# Patient Record
Sex: Male | Born: 1950
Health system: Southern US, Community
[De-identification: ages and names within clinical notes are randomized; demographics above are authoritative.]

## PROBLEM LIST (undated history)

## (undated) DIAGNOSIS — E785 Hyperlipidemia, unspecified: Secondary | ICD-10-CM

## (undated) DIAGNOSIS — T7840XA Allergy, unspecified, initial encounter: Secondary | ICD-10-CM

## (undated) DIAGNOSIS — I10 Essential (primary) hypertension: Secondary | ICD-10-CM

## (undated) DIAGNOSIS — I509 Heart failure, unspecified: Secondary | ICD-10-CM

## (undated) DIAGNOSIS — I219 Acute myocardial infarction, unspecified: Secondary | ICD-10-CM

## (undated) DIAGNOSIS — I2510 Atherosclerotic heart disease of native coronary artery without angina pectoris: Secondary | ICD-10-CM

## (undated) DIAGNOSIS — E119 Type 2 diabetes mellitus without complications: Secondary | ICD-10-CM

## (undated) HISTORY — DX: Essential (primary) hypertension: I10

## (undated) HISTORY — PX: PERCUTANEOUS CORONARY STENT INTERVENTION (PCI-S): SHX6016

## (undated) HISTORY — DX: Hyperlipidemia, unspecified: E78.5

## (undated) HISTORY — DX: Acute myocardial infarction, unspecified: I21.9

## (undated) HISTORY — PX: APPENDECTOMY: SHX54

## (undated) HISTORY — DX: Allergy, unspecified, initial encounter: T78.40XA

---

## 1993-12-30 HISTORY — PX: CORONARY ARTERY BYPASS GRAFT: SHX141

## 2008-12-30 HISTORY — PX: OTHER SURGICAL HISTORY: SHX169

## 2008-12-30 HISTORY — PX: ESOPHAGOGASTRODUODENOSCOPY: SHX1529

## 2012-07-09 ENCOUNTER — Ambulatory Visit: Payer: Self-pay | Admitting: Family Medicine

## 2012-07-09 LAB — URINALYSIS, COMPLETE
Bacteria: NEGATIVE
Bilirubin,UR: NEGATIVE
Blood: NEGATIVE
Glucose,UR: NEGATIVE mg/dL (ref 0–75)
Leukocyte Esterase: NEGATIVE
Nitrite: NEGATIVE
Protein: NEGATIVE
Specific Gravity: 1.025 (ref 1.003–1.030)
WBC UR: NONE SEEN /HPF (ref 0–5)

## 2012-07-10 LAB — URINE CULTURE

## 2014-05-09 DIAGNOSIS — I25119 Atherosclerotic heart disease of native coronary artery with unspecified angina pectoris: Secondary | ICD-10-CM | POA: Insufficient documentation

## 2014-05-09 DIAGNOSIS — I25810 Atherosclerosis of coronary artery bypass graft(s) without angina pectoris: Secondary | ICD-10-CM | POA: Insufficient documentation

## 2014-05-11 DIAGNOSIS — I214 Non-ST elevation (NSTEMI) myocardial infarction: Secondary | ICD-10-CM | POA: Insufficient documentation

## 2014-10-10 ENCOUNTER — Inpatient Hospital Stay: Payer: Self-pay | Admitting: Internal Medicine

## 2014-10-10 LAB — BASIC METABOLIC PANEL
Anion Gap: 6 — ABNORMAL LOW (ref 7–16)
BUN: 15 mg/dL (ref 7–18)
CHLORIDE: 106 mmol/L (ref 98–107)
Calcium, Total: 8.2 mg/dL — ABNORMAL LOW (ref 8.5–10.1)
Co2: 29 mmol/L (ref 21–32)
Creatinine: 1.4 mg/dL — ABNORMAL HIGH (ref 0.60–1.30)
EGFR (Non-African Amer.): 54 — ABNORMAL LOW
Glucose: 202 mg/dL — ABNORMAL HIGH (ref 65–99)
OSMOLALITY: 288 (ref 275–301)
Potassium: 4.2 mmol/L (ref 3.5–5.1)
SODIUM: 141 mmol/L (ref 136–145)

## 2014-10-10 LAB — CBC
HCT: 42.8 % (ref 40.0–52.0)
HGB: 14.9 g/dL (ref 13.0–18.0)
MCH: 33.5 pg (ref 26.0–34.0)
MCHC: 34.9 g/dL (ref 32.0–36.0)
MCV: 96 fL (ref 80–100)
PLATELETS: 182 10*3/uL (ref 150–440)
RBC: 4.46 10*6/uL (ref 4.40–5.90)
RDW: 14.2 % (ref 11.5–14.5)
WBC: 8.3 10*3/uL (ref 3.8–10.6)

## 2014-10-10 LAB — CK-MB
CK-MB: 3.8 ng/mL — AB (ref 0.5–3.6)
CK-MB: 10.3 ng/mL — ABNORMAL HIGH (ref 0.5–3.6)
CK-MB: 12.6 ng/mL — ABNORMAL HIGH (ref 0.5–3.6)
CK-MB: 8.7 ng/mL — ABNORMAL HIGH (ref 0.5–3.6)

## 2014-10-10 LAB — PRO B NATRIURETIC PEPTIDE: B-Type Natriuretic Peptide: 388 pg/mL — ABNORMAL HIGH (ref 0–125)

## 2014-10-10 LAB — TROPONIN I
TROPONIN-I: 0.09 ng/mL — AB
TROPONIN-I: 1.4 ng/mL — AB
Troponin-I: 1.7 ng/mL — ABNORMAL HIGH

## 2014-10-11 LAB — LIPID PANEL
Cholesterol: 102 mg/dL (ref 0–200)
HDL Cholesterol: 24 mg/dL — ABNORMAL LOW (ref 40–60)
Ldl Cholesterol, Calc: 39 mg/dL (ref 0–100)
Triglycerides: 194 mg/dL (ref 0–200)
VLDL Cholesterol, Calc: 39 mg/dL (ref 5–40)

## 2014-10-11 LAB — BASIC METABOLIC PANEL
Anion Gap: 8 (ref 7–16)
BUN: 16 mg/dL (ref 7–18)
Calcium, Total: 7.8 mg/dL — ABNORMAL LOW (ref 8.5–10.1)
Chloride: 107 mmol/L (ref 98–107)
Co2: 27 mmol/L (ref 21–32)
Creatinine: 1.19 mg/dL (ref 0.60–1.30)
EGFR (African American): >60
EGFR (Non-African Amer.): >60
Glucose: 114 mg/dL — ABNORMAL HIGH (ref 65–99)
Osmolality: 285 (ref 275–301)
Potassium: 4.1 mmol/L (ref 3.5–5.1)
Sodium: 142 mmol/L (ref 136–145)

## 2014-10-11 LAB — TSH: Thyroid Stimulating Horm: 2.85 u[IU]/mL

## 2014-10-11 LAB — HEMOGLOBIN A1C: Hemoglobin A1C: 6.9 % — ABNORMAL HIGH (ref 4.2–6.3)

## 2015-04-22 NOTE — Consult Note (Signed)
PATIENT NAME:  Gary Gutierrez, Gary Gutierrez DATE OF BIRTH:  Oct 22, 1951  DATE OF CONSULTATION:  10/10/2014  REFERRING PHYSICIAN:   CONSULTING PHYSICIAN:  Ursula Dermody D. Juliann Paresallwood, MD  PRIMARY PHYSICIAN: Bari EdwardLaura Berglund, MD  CARDIOLOGIST: Lamar BlinksBruce J. Kowalski, MD    INDICATION: Angina, possible non-STEMI.   HISTORY OF PRESENT ILLNESS: The patient is a 64 year old white male who came to the Emergency Room with unstable anginal symptoms, began the day before he came in, got treated with nitroglycerin, which seemed to help. In the evening, the pain returned while lying in bed. He thought the pain was positional. He has had some chronic sternal pain since his coronary bypass surgery. After repositioning and getting something to drink, the pain did not go away, it got worse. He came to the Emergency Room, finally was admitted. Total duration of the symptoms was about an hour. Pain did not radiate. Mild shortness of breath. He complains of pressure. He has had significant cardiac symptoms in the past, including myocardial infarction, coronary artery [place] he [place]. [place]    he had stents placed in 2010. Most recently he [facility] or 4 months ago. He does not remember what artery or vein was worked on, but he thinks it was one of the bypass grafts. He has been maintained on Plavix. He has been doing reasonably well until this episode of pain.   REVIEW OF SYSTEMS: No blackout spells or syncope. Denies nausea, vomiting. Denies fever, chills, sweats. No weight loss. No weight gain. No hemoptysis, hematemesis. Denies bright red blood per rectum. No vision change or hearing change. Denies sputum production. Denies cough, just complains of chest pressure with mild shortness of breath at rest.   PAST MEDICAL HISTORY: Coronary artery disease, myocardial infarction, hypertension, hyperlipidemia,  diabetes, gout, arthritis, past history of smoking.   PAST SURGICAL HISTORY: Appendectomy, 3-vessel bypass surgery, PCI and stent x 2.   FAMILY HISTORY: Heart disease, smoker, lung cancer.   SOCIAL HISTORY: Former smoker, quit years ago. Married, Corporate investment bankerconstruction worker but retired. Denies significant alcohol consumption.   MEDICATIONS: Allopurinol 300 mg a day, Coreg 25 twice a day, Crestor 10 mg a day, glipizide 10 mg once a day, Janumet 50/1000 twice a day, lisinopril 10 a day, omeprazole 40 a day, Plavix 75 a day.   ALLERGIES: PENICILLIN.   LABORATORIES: Glucose of 202, BNP 388, BUN 15, creatinine 1.4, sodium 141, potassium 4.2, calcium 8.2. Troponin  and subsequently increased to 1.7. White count of 8.3, hemoglobin 14.9, hematocrit 42.8. Chest x-ray: Diffuse interstitial markings, possible failure versus bronchitis   PHYSICAL EXAMINATION:  VITAL SIGNS: Blood pressure was 120/70, pulse of 90, respiratory rate of 16, afebrile.  HEENT: Normocephalic, atraumatic. Pupils equal and reactive to light.  NECK: Supple. No significant JVD, bruits or adenopathy.  LUNGS: Clear to auscultation and percussion. No significant wheeze, rhonchi or rale.  HEART: Regular rate and rhythm. Systolic ejection murmur at left sternal border.  ABDOMEN: Benign.  EXTREMITIES: Within normal limits.  NEUROLOGIC: Intact.  SKIN: Normal.   ASSESSMENT: Unstable angina, possible non-Q-wave myocardial infarction, hypertension, diabetes, hyperlipidemia, gout, renal insufficiency.   PLAN: Agree with admit. Follow up cardiac enzymes. Follow up EKG, which were unremarkable. Continue anticoagulation. Recommend cardiac catheterization for evaluation of significant known coronary artery disease and unstable anginal symptoms. Plan, proceed with cardiac catheterization for further evaluation with elevated troponins. Consider treatment.  For diabetes, continue Janumet, hold the dose today. Hyperlipidemia, continue Crestor therapy.  For gout, continue allopurinol. For hypertension, continue lisinopril and Coreg. Renal insufficiency, consider hydration, consider bicarbonate.   Deep venous thrombosis prophylaxis. Consider echocardiogram for assessment of LV function and base further evaluation on the results of cardiac catheterization today.     ____________________________ Bobbie Stack. Juliann Pares, MD ddc:TT D: 10/10/2014 13:06:40 ET T: 10/10/2014 14:12:46 ET JOB#: [id number]  cc: Aliece Honold D. Juliann Pares, MD, <Dictator> Alwyn Pea MD ELECTRONICALLY SIGNED 11/02/2014 10:48

## 2015-04-22 NOTE — H&P (Signed)
PATIENT NAME:  Gary Gutierrez, Gary Gutierrez MR#:  [id number] DATE OF BIRTH:  27-May-1951  DATE OF ADMISSION:  10/10/2014  REFERRING PHYSICIAN: Rebecka ApleyAllison P. Webster, MD.  PRIMARY CARE PHYSICIAN: Bari EdwardLaura Berglund, MD.   ADMISSION DIAGNOSIS: Chest pain.   HISTORY OF PRESENT ILLNESS: This is a 64 year old Caucasian male who presents to the Emergency Department complaining of substernal chest pain. The patient states it actually began yesterday, but resolved with some nitroglycerin that he had left over from a prescription that is quite old. This evening the pain returned as he was lying in his bed. He thought the pain was positional as he has a chronic history of sternal pain since having a coronary artery bypass graft surgery. When he repositioned himself on his back he realized the pain was persisting and he got up to drink some water thinking that it may be reflux. The pain continued and actually grew in severity until he got to the Emergency Department at which point it decreased some and then resolved finally with nitropaste. Total duration of the pain was approximately an hour. The pain did not radiate. It felt like an extreme pressure in his chest. Due to his cardiac history and current symptoms, the Emergency Department called for admission.   REVIEW OF SYSTEMS: CONSTITUTIONAL: The patient denies fever or weakness.  EYES: Denies diplopia or inflammation.  EARS, NOSE AND THROAT: Denies tinnitus or sore throat.  RESPIRATORY: Denies cough or shortness of breath.  CARDIOVASCULAR: Admits to chest pain, but denies orthopnea, palpitations or dyspnea on exertion.  GASTROINTESTINAL: The patient denies nausea, vomiting, diarrhea or abdominal pain.  GENITOURINARY: Denies dysuria, increased frequency or hesitancy.  ENDOCRINE: Denies polyuria or nocturia.  HEMATOLOGIC AND LYMPHATIC: Denies easy bruising or bleeding.  INTEGUMENTARY: Denies rash or lesions.  MUSCULOSKELETAL: Admits to back and neck pain, but denies  myalgias.  NEUROLOGIC: The patient denies numbness in his extremities or dysarthria.  PSYCHIATRIC: Denies depression or suicidal ideation.   PAST MEDICAL HISTORY: Coronary artery disease (myocardial infarction at 64 years old), hypertension, hyperlipidemia, diabetes mellitus type 2, gout and arthritis in his knees and hips as well as neck and back pain.   PAST SURGICAL HISTORY: Appendectomy, and three vessel coronary artery bypass grafts.  FAMILY HISTORY: Parents as well as his grandparents on both sides are deceased of heart disease. He has a brother who was a smoker, who is deceased of lung cancer.   SOCIAL HISTORY: The patient is a former smoker, but denies alcohol or illicit drug use. 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 He tries to stay active in his yard. He has an exercise climber that he uses without developing chest pain.   MEDICATIONS: 1.  Allopurinol 300 mg 1 tab p.o. daily.  2.  Carvedilol 25 mg 1 tab p.o. twice a day.  3.  Crestor 10 mg 1 tab p.o. at bedtime.  4.  Glipizide 10 mg extended release 1 tablet p.o. daily.  5.  Janumet 50 mg-1000 mg 1 tab p.o. twice a day.  6.  Lisinopril 10 mg 1 tab p.o. daily.  7.  Omeprazole 40 mg 1 capsule p.o. daily.  8.  Plavix 75 mg 1 tab p.o. daily.   ALLERGIES: PENICILLIN.   PERTINENT LABORATORY RESULTS AND RADIOGRAPHIC FINDINGS: Serum glucose is 202. BNP is 388, BUN 15, creatinine 1.40, sodium 141, potassium 4.2, chloride 106, bicarbonate 29. Calcium is 8.2. Troponin is 0.09. White blood cell count 8.3, hemoglobin 14.9, hematocrit 42.8. Chest x-ray shows some diffuse interstitial coarsening, which  could be consistent with congestive heart failure or bronchitis.   PHYSICAL EXAMINATION: VITAL SIGNS: Temperature is 98, pulse 94, respirations 19, blood pressure 120/79, pulse oximetry 99% on 2 liters oxygen via nasal cannula.  GENERAL: Alert and oriented x 3 in no apparent distress.  HEENT: Normocephalic, atraumatic. Pupils  equal, round, and reactive to light and accommodation. Extraocular movements are intact. Mucous membranes are moist.  NECK: Trachea is midline. No adenopathy.  CHEST: Symmetric and atraumatic, well-healed mid sternal scar.  CARDIOVASCULAR: Regular rate and rhythm. Normal S1 and S2. No rubs, clicks, or murmurs, but heart sounds are somewhat distant.  LUNGS: Clear to auscultation bilaterally. Normal effort and excursion.  ABDOMEN: Positive bowel sounds. Soft, nontender, nondistended. No hepatosplenomegaly.  GENITOURINARY: Deferred.  MUSCULOSKELETAL: The patient moves all 4 extremities equally. Strength is 5 out of 5 in upper and lower extremities bilaterally.  SKIN: No rashes or lesions.  EXTREMITIES: No clubbing, cyanosis, or edema.  NEUROLOGIC: Cranial nerves II through XII are grossly intact.  PSYCHIATRIC: Mood is normal. Affect is congruent.   ASSESSMENT AND PLAN: This is a 64 year old male with possible ischemic chest pain.  1. Chest pain: The patient's initial troponins are mildly elevated, but he has no acute EKG changes. He is currently out of pain, but we will continue to track his biomarkers. He was given a therapeutic dose of Lovenox in the ED, which we will continue once the patient arrives on the floor. A cardiology consultation has been ordered for possible myocardial imaging and stress testing.  2.  Hypertension: Continue lisinopril and Coreg.  3. Diabetes: The patient will be on sliding scale insulin while hospitalized. He can continue Januvia.  4.  Hyperlipidemia: Continue Crestor.  5.  Gout: Continue allopurinol.  6. Renal insufficiency: We do not have a baseline creatinine, but the patient's current GFR places him in stage 3 chronic kidney disease. He does not appear dehydrated and we will allow him to drink by mouth and avoid nephrotoxic agents.  7. Deep vein thrombosis prophylaxis: Continue therapeutic Lovenox.  8. Gastrointestinal prophylaxis: Pantoprazole.   STATUS CODE:  The patient is a full code.   TIME SPENT ON ADMISSION ORDERS AND PATIENT CARE: Approximately 35 minutes.   ___________________________ Kelton Pillar. Sheryle Hail, MD msd:hh D: 10/10/2014 02:58:18 ET T: 10/10/2014 03:23:35 ET JOB#: [id number]  cc: Kelton Pillar. Sheryle Hail, MD, <Dictator> Kelton Pillar Orley Lawry MD ELECTRONICALLY SIGNED 10/21/2014 7:31

## 2015-04-22 NOTE — Consult Note (Signed)
Chief Complaint:  Subjective/Chief Complaint States to be doing reasonably well no chest pain no shortness of breath status post PCI and stent and feels much better.   VITAL SIGNS/ANCILLARY NOTES: **Vital Signs.:   13-Oct-15 08:00  Vital Signs Type Routine  Temperature Temperature (F) 97.7  Celsius 36.5  Temperature Source axillary  Pulse Pulse 80  Respirations Respirations 14  Systolic BP Systolic BP 836  Diastolic BP (mmHg) Diastolic BP (mmHg) 81  Mean BP 99  Pulse Ox % Pulse Ox % 98  Pulse Ox Activity Level  At rest  Oxygen Delivery Room Air/ 21 %  *Intake and Output.:   Shift 13-Oct-15 07:00  Grand Totals Intake:  440 Output:      Net:  440 24 Hr.:  -583  Oral Intake      In:  240  IV (Primary)      In:  200  Length of Stay Totals Intake:  1477 Output:  2060    Net:  -583   Brief Assessment:  GEN well developed, well nourished, no acute distress   Cardiac Regular  murmur present  -- JVD   Respiratory normal resp effort  clear BS   Gastrointestinal Normal   Gastrointestinal details normal Soft  Nontender  Nondistended  No masses palpable   EXTR negative cyanosis/clubbing, negative edema   Lab Results: Thyroid:  13-Oct-15 04:14   Thyroid Stimulating Hormone 2.85 (0.45-4.50 (IU = International Unit)  ----------------------- Pregnant patients have  different reference  ranges for TSH:  - - - - - - - - - -  Pregnant, first trimetser:  0.36 - 2.50 uIU/mL)  Cardiology:  12-Oct-15 00:46   Ventricular Rate 98  Atrial Rate 98  P-R Interval 186  QRS Duration 98  QT 326  QTc 416  P Axis 41  R Axis 69  T Axis 38  ECG interpretation Sinus rhythm with frequent Premature ventricular complexes Possible Inferior infarct , age undetermined Cannot rule out Anterior infarct , age undetermined Abnormal ECG No previous ECGs available ----------unconfirmed---------- Confirmed by OVERREAD, NOT ([id number]), editor PEARSON, BARBARA ([id number]) on 10/10/2014 12:54:38 PM    15:06    Ventricular Rate 77  Atrial Rate 77  P-R Interval 202  QRS Duration 92  QT 384  QTc 434  P Axis 63  R Axis 75  T Axis 106  ECG interpretation Normal sinus rhythm Nonspecific T wave abnormality Abnormal ECG When compared with ECG of 10-Oct-2014 00:46, Premature ventricular complexes are no longer Present Nonspecific T wave abnormality, improved in Inferior leads Nonspecific T wave abnormality now evident in Anterolateral leads ----------unconfirmed---------- Confirmed by OVERREAD, NOT ([id number]), editor PEARSON, BARBARA ([id number]) on 10/11/2014 7:59:01 AM  Routine Chem:  12-Oct-15 01:05   Glucose, Serum  202  BUN 15  Creatinine (comp)  1.40  Sodium, Serum 141  Potassium, Serum 4.2  Chloride, Serum 106  CO2, Serum 29  Calcium (Total), Serum  8.2  Anion Gap  6  Osmolality (calc) 288  eGFR (African American) >60  eGFR (Non-African American)  54 (eGFR values <57m/min/1.73 m2 may be an indication of chronic kidney disease (CKD). Calculated eGFR, using the MRDR Study equation, is useful in  patients with stable renal function. The eGFR calculation will not be reliable in acutely ill patients when serum creatinine is changing rapidly. It is not useful in patients on dialysis. The eGFR calculation may not be applicable to patients at the low and high extremes of body sizes, pregnant women, and vetetarians.)  Result  Comment TROPONIN - RESULTS VERIFIED BY REPEAT TESTING.  - C/BETH BRANDEL AT 0146 10/10/14.[facility]  - READ-BACK PROCESS PERFORMED.  Result(s) reported on 10 Oct 2014 at 01:48AM.  B-Type Natriuretic Peptide [facility])  388 (Result(s) reported on 10 Oct 2014 at 01:44AM.)    05:30   Result Comment TROPONIN - RESULTS VERIFIED BY REPEAT TESTING.  - CALLED TO FELECIA PREUDHOMME AT 2330 ON  - 10/10/2014.Marland KitchenMarland KitchenTPL  - READ-BACK PROCESS PERFORMED.  Result(s) reported on 10 Oct 2014 at 06:28AM.    09:12   Result Comment Troponin - RESULTS VERIFIED BY REPEAT TESTING.  - Elevated troponin  previously called @  - 0146 10/10/14 by [facility] - BGB.  Result(s) reported on 10 Oct 2014 at 10:12AM.  13-Oct-15 04:14   Glucose, Serum  114  BUN 16  Creatinine (comp) 1.19  Sodium, Serum 142  Potassium, Serum 4.1  Chloride, Serum 107  CO2, Serum 27  Calcium (Total), Serum  7.8  Anion Gap 8  Osmolality (calc) 285  eGFR (African American) >60  eGFR (Non-African American) >60 (eGFR values <51m/min/1.73 m2 may be an indication of chronic kidney disease (CKD). Calculated eGFR, using the MRDR Study equation, is useful in  patients with stable renal function. The eGFR calculation will not be reliable in acutely ill patients when serum creatinine is changing rapidly. It is not useful in patients on dialysis. The eGFR calculation may not be applicable to patients at the low and high extremes of body sizes, pregnant women, and vetetarians.)  Hemoglobin A1c ([facility])  6.9 (The American Diabetes Association recommends that a primary goal of therapy should be <7% and that physicians should reevaluate the treatment regimen in patients with HbA1c values consistently >8%.)  Cholesterol, Serum 102  Triglycerides, Serum 194  HDL (INHOUSE)  24  VLDL Cholesterol Calculated 39  LDL Cholesterol Calculated 39 (Result(s) reported on 11 Oct 2014 at 05:18AM.)  Cardiac:  12-Oct-15 01:05   CPK-MB, Serum  3.8 (Result(s) reported on 10 Oct 2014 at 03:22AM.)  Troponin I  0.09 (0.00-0.05 0.05 ng/mL or less: NEGATIVE  Repeat testing in 3-6 hrs  if clinically indicated. >0.05 ng/mL: POTENTIAL  MYOCARDIAL INJURY. Repeat  testing in 3-6 hrs if  clinically indicated. NOTE: An increase or decrease  of 30% or more on serial  testing suggests a  clinically important change)    05:30   CPK-MB, Serum  10.3 (Result(s) reported on 10 Oct 2014 at 06:25AM.)  Troponin I  1.40 (0.00-0.05 0.05 ng/mL or less: NEGATIVE  Repeat testing in 3-6 hrs  if clinically indicated. >0.05 ng/mL: POTENTIAL  MYOCARDIAL INJURY.  Repeat  testing in 3-6 hrs if  clinically indicated. NOTE: An increase or decrease  of 30% or more on serial  testing suggests a  clinically important change)    09:12   CPK-MB, Serum  12.6 (Result(s) reported on 10 Oct 2014 at 10:06AM.)  Troponin I  1.70 (0.00-0.05 0.05 ng/mL or less: NEGATIVE  Repeat testing in 3-6 hrs  if clinically indicated. >0.05 ng/mL: POTENTIAL  MYOCARDIAL INJURY. Repeat  testing in 3-6 hrs if  clinically indicated. NOTE: An increase or decrease  of 30% or more on serial  testing suggests a  clinically important change)    14:43   CPK-MB, Serum  8.7 (Result(s) reported on 10 Oct 2014 at 03:06PM.)  Routine Hem:  12-Oct-15 01:05   WBC (CBC) 8.3  RBC (CBC) 4.46  Hemoglobin (CBC) 14.9  Hematocrit (CBC) 42.8  Platelet Count (CBC) 182 (Result(s) reported on 10 Oct 2014 at 01:22AM.)  MCV 96  MCH 33.5  MCHC 34.9  RDW 14.2   Radiology Results: XRay:    12-Oct-15 01:20, Chest Portable Single View  Chest Portable Single View   REASON FOR EXAM:    chest pain  COMMENTS:       PROCEDURE: DXR - DXR PORTABLE CHEST SINGLE VIEW  - Oct 10 2014  1:20AM     CLINICAL DATA:  Intermittent chest pain since Saturday. Initial  encounter    EXAM:  PORTABLE CHEST - 1 VIEW    COMPARISON:  None.    FINDINGS:  Mild cardiomegaly. The patient is status post CABG. Negative aortic  and hilar contours when accounting for rightward rotation.    Diffuse interstitial prominence without Kerley lines are focal  consolidation. No effusion or pneumothorax.     IMPRESSION:  Diffuse interstitial coarsening which could be congestive or  bronchitic.      Electronically Signed    By: Jorje Guild M.D.    On: 10/10/2014 01:37       Verified By: Gilford Silvius, M.D.,  Cardiology:    12-Oct-15 00:46, ED ECG  Ventricular Rate 98  Atrial Rate 98  P-R Interval 186  QRS Duration 98  QT 326  QTc 416  P Axis 41  R Axis 69  T Axis 38  ECG interpretation   Sinus  rhythm with frequent Premature ventricular complexes  Possible Inferior infarct , age undetermined  Cannot rule out Anterior infarct , age undetermined  Abnormal ECG  No previous ECGs available  ----------unconfirmed----------  Confirmed by OVERREAD, NOT ([id number]), editor PEARSON, BARBARA ([id number]) on 10/10/2014 12:54:38 PM  ED ECG     12-Oct-15 15:06, ECG  Ventricular Rate 77  Atrial Rate 77  P-R Interval 202  QRS Duration 92  QT 384  QTc 434  P Axis 63  R Axis 75  T Axis 106  ECG interpretation   Normal sinus rhythm  Nonspecific T wave abnormality  Abnormal ECG  When compared with ECG of 10-Oct-2014 00:46,  Premature ventricular complexes are no longer Present  Nonspecific T wave abnormality, improved in Inferior leads  Nonspecific T wave abnormality now evident in Anterolateral leads  ----------unconfirmed----------  Confirmed by OVERREAD, NOT ([id number]), editor PEARSON, BARBARA ([id number]) on 10/11/2014 7:59:01 AM  ECG    Assessment/Plan:  Assessment/Plan:  Assessment IMP  status post PCI and stent  coronary disease  angina  non-Q-wave myocardial infarction  hypertension  diabetes  chronic renal insufficiency   hyperlipidemia .   Plan PLAN 1 patient doing reasonably well and status post PCI and stent with DES 2 increase Crestor because of severe hyperlipidemia 3 continue diabetes management with glipizide and Janumet the min as per primary physician 4 continue  hypertension therapy 5 follow-up renal sufficiency consider referral to Nephrology  6 aspirin 81 mg a day 7 the Plavix therapy for at least a year if not indefinitely 8 cardiac rehab at heart tract 9 outpatient follow-up with BK in 1-2 weeks   Electronic Signatures: Lujean Amel D (MD)  (Signed 14-Oct-15 10:40)  Authored: Chief Complaint, VITAL SIGNS/ANCILLARY NOTES, Brief Assessment, Lab Results, Radiology Results, Assessment/Plan   Last Updated: 14-Oct-15 10:40 by Lujean Amel D (MD)

## 2015-04-22 NOTE — Discharge Summary (Signed)
PATIENT NAME:  Gary Gutierrez, Gary W MR#:  [id number] DATE OF BIRTH:  Dec 14, 1951  DATE OF ADMISSION:  10/10/2014 DATE OF DISCHARGE:  10/11/2014  For a detailed note please refer to the history and physical done on admission by Dr. Sheryle Hailiamond.   DIAGNOSES AT DISCHARGE:   1.  Non-ST elevation myocardial infarction status post catheter and drug eluding stent placement to the proximal circumflex.  2.  Diabetes.  3.  Hypertension.  4.  Hyperlipidemia.  5.  Previous history of coronary artery disease.    DISCHARGE INSTRUCTIONS:  The patient is being discharged on a low-sodium, low-fat, American Diabetic Association diet.  Activity is as tolerated.  Follow up with Dr. Dorothyann Pengwayne Callwood and Dr. Bari EdwardLaura Berglund in the low the next 1-2 weeks.   DISCHARGE MEDICATIONS: Glipizide 10 mg extended release daily, Plavix 75 mg daily, lisinopril 10 mg daily, Janumet 50/1000 one tab b.i.d., allopurinol 300 mg daily, Coreg 25 mg b.i.d., Crestor 20 mg at bedtime, Protonix 40 mg daily, and aspirin 81 mg daily.   CONSULTANTS DURING THE HOSPITAL COURSE: Dr. Dorothyann Pengwayne Callwood from cardiology.   PERTINENT STUDIES DONE DURING THE HOSPITAL COURSE:  A chest x-ray done on admission showing diffuse interstitial coarsening which could be congestive or bronchitic in nature. Cardiac catheterization done on 10/10/2014 with drug-eluting stent placement to the SVG to the circumflex graft. Moderately reduced LV systolic function.   HOSPITAL COURSE: This is a 64 year old male with medical problems as mentioned above, who presented to the hospital on 10/10/2001 due to chest pain and noted to have elevated troponin.   1.  Non-ST elevation MI.  This was the likely clinical diagnosis given the patient's chest pain and elevated cardiac markers. The patient did rule in for a non-ST elevation MI given his progressive elevation of his cardiac markers. The patient was maintained on some aspirin and Lovenox, beta blocker statin and low-dose ACE inhibitor  along with his Plavix. Cardiology consult was obtained. The patient was seen by Dr. Juliann Paresallwood who performed a cardiac catheterization and found 100% stenosis in the proximal circumflex graft, which was stented. Post stent and PCI the patient has been chest pain-free and hemodynamically stable. He is therefore presently being discharged on aspirin, Plavix, beta blocker, statin, and ACE inhibitor as mentioned above. He will have close followup with his cardiologist in the next 1-2 weeks.  2.  Diabetes. The patient's blood sugars remained stable. He was maintained on some sliding scale insulin and his metformin was held as he had a recent cardiac catheterization. He will resume his Januvia and metformin 24 hours after discharge. 3.  Hyperlipidemia.  The patient was maintained on his Crestor, his dose was increased as he had a recent non-ST elevation MI, it was increased from 10 to 20.  4.  Hypertension. The patient's blood pressure remained stable. He will continue his Coreg and lisinopril.  5.  Gout.  The patient had no acute gout attack. He will continue his allopurinol as stated.   CODE STATUS: The patient is a full code.   TIME SPENT ON DISCHARGE: 40 minutes.     ____________________________ Rolly PancakeVivek J. Cherlynn KaiserSainani, MD vjs:bu D: 10/11/2014 14:14:19 ET T: 10/11/2014 15:40:24 ET JOB#: [id number]  cc: Rolly PancakeVivek J. Cherlynn KaiserSainani, MD, <Dictator> Dwayne D. Juliann Paresallwood, MD Bari EdwardLaura Berglund, MD [place] SirenVIVEK J Kessler Kopinski MD ELECTRONICALLY SIGNED 11/07/2014 10:54

## 2015-05-09 DIAGNOSIS — Z87898 Personal history of other specified conditions: Secondary | ICD-10-CM | POA: Diagnosis not present

## 2015-05-09 DIAGNOSIS — I10 Essential (primary) hypertension: Secondary | ICD-10-CM | POA: Diagnosis not present

## 2015-05-09 DIAGNOSIS — I25119 Atherosclerotic heart disease of native coronary artery with unspecified angina pectoris: Secondary | ICD-10-CM | POA: Diagnosis not present

## 2015-05-09 DIAGNOSIS — I209 Angina pectoris, unspecified: Secondary | ICD-10-CM | POA: Diagnosis not present

## 2015-05-14 ENCOUNTER — Emergency Department
Admission: EM | Admit: 2015-05-14 | Discharge: 2015-05-14 | Disposition: A | Discharge status: Home / Self Care | Payer: Medicare Other | Attending: Emergency Medicine | Admitting: Emergency Medicine

## 2015-05-14 ENCOUNTER — Encounter: Payer: Self-pay | Admitting: *Deleted

## 2015-05-14 DIAGNOSIS — Z87891 Personal history of nicotine dependence: Secondary | ICD-10-CM | POA: Diagnosis not present

## 2015-05-14 DIAGNOSIS — L089 Local infection of the skin and subcutaneous tissue, unspecified: Secondary | ICD-10-CM | POA: Diagnosis present

## 2015-05-14 DIAGNOSIS — Z880 Allergy status to penicillin: Secondary | ICD-10-CM | POA: Insufficient documentation

## 2015-05-14 DIAGNOSIS — L03011 Cellulitis of right finger: Secondary | ICD-10-CM | POA: Diagnosis not present

## 2015-05-14 HISTORY — DX: Type 2 diabetes mellitus without complications: E11.9

## 2015-05-14 HISTORY — DX: Atherosclerotic heart disease of native coronary artery without angina pectoris: I25.10

## 2015-05-14 NOTE — Discharge Instructions (Signed)
Paronychia Paronychia is an inflammatory reaction involving the folds of the skin surrounding the fingernail. This is commonly caused by an infection in the skin around a nail. The most common cause of paronychia is frequent wetting of the hands (as seen with bartenders, food servers, nurses or others who wet their hands). This makes the skin around the fingernail susceptible to infection by bacteria (germs) or fungus. Other predisposing factors are:  Aggressive manicuring.  Nail biting.  Thumb sucking. The most common cause is a staphylococcal (a type of germ) infection, or a fungal (Candida) infection. When caused by a germ, it usually comes on suddenly with redness, swelling, pus and is often painful. It may get under the nail and form an abscess (collection of pus), or form an abscess around the nail. If the nail itself is infected with a fungus, the treatment is usually prolonged and may require oral medicine for up to one year. Your caregiver will determine the length of time treatment is required. The paronychia caused by bacteria (germs) may largely be avoided by not pulling on hangnails or picking at cuticles. When the infection occurs at the tips of the finger it is called felon. When the cause of paronychia is from the herpes simplex virus (HSV) it is called herpetic whitlow. TREATMENT  When an abscess is present treatment is often incision and drainage. This means that the abscess must be cut open so the pus can get out. When this is done, the following home care instructions should be followed. HOME CARE INSTRUCTIONS   It is important to keep the affected fingers very dry. Rubber or plastic gloves over cotton gloves should be used whenever the hand must be placed in water.  Keep wound clean, dry and dressed as suggested by your caregiver between warm soaks or warm compresses.  Soak in warm water for fifteen to twenty minutes three to four times per day for bacterial infections. Fungal  infections are very difficult to treat, so often require treatment for long periods of time.  For bacterial (germ) infections take antibiotics (medicine which kill germs) as directed and finish the prescription, even if the problem appears to be solved before the medicine is gone.  Only take over-the-counter or prescription medicines for pain, discomfort, or fever as directed by your caregiver. SEEK IMMEDIATE MEDICAL CARE IF:  You have redness, swelling, or increasing pain in the wound.  You notice pus coming from the wound.  You have a fever.  You notice a bad smell coming from the wound or dressing. Document Released: 06/11/2001 Document Revised: 03/09/2012 Document Reviewed: 02/10/2009 [place] Patient Information 2015 [place], [place]. This information is not intended to replace advice given to you by your health care provider. Make sure you discuss any questions you have with your health care provider.   Keep the wound clean, dry, and covered.

## 2015-05-14 NOTE — ED Provider Notes (Signed)
[facility] Emergency Department Provider Note ____________________________________________  Time seen: 1224  I have reviewed the triage vital signs and the nursing notes.  HISTORY  Chief Complaint Finger Injury  HPI Gary Gutierrez is a 64 y.o. male reports to the ED with right thumb infection. He reports bone off a hangnail about a week ago, and has since developed some pain, redness, swelling and pus around the thumbnail on the right. He attempted to release some of the pus last night using a small diabetic lancet, reports only small amount of purulent discharge at that time. He is here for treatment denied any fever, chills, or sweats.  Past Medical History  Diagnosis Date  . Diabetes mellitus without complication   . Coronary artery disease    There are no active problems to display for this patient.  Past Surgical History  Procedure Laterality Date  . Appendectomy     No current outpatient prescriptions on file.  Allergies Penicillins  No family history on file.  Social History History  Substance Use Topics  . Smoking status: Former Games developermoker  . Smokeless tobacco: Not on file  . Alcohol Use: No   Review of Systems  Constitutional: Negative for fever. Eyes: Negative for visual changes. ENT: Negative for sore throat. Cardiovascular: Negative for chest pain. Respiratory: Negative for shortness of breath. Gastrointestinal: Negative for abdominal pain, vomiting and diarrhea. Genitourinary: Negative for dysuria. Musculoskeletal: Negative for back pain. Skin: Negative for rash. Positive for cuticle infection Neurological: Negative for headaches, focal weakness or numbness.  10-point ROS otherwise negative. ____________________________________________  PHYSICAL EXAM:  VITAL SIGNS: ED Triage Vitals  Enc Vitals Group     BP 05/14/15 1107 135/97 mmHg     Pulse Rate 05/14/15 1107 87     Resp --      Temp 05/14/15 1107 98.3 F (36.8 C)      Temp src --      SpO2 05/14/15 1107 100 %     Weight 05/14/15 1107 218 lb (98.884 kg)     Height 05/14/15 1107 5' (1.524 m)     Head Cir --      Peak Flow --      Pain Score 05/14/15 1108 5     Pain Loc --      Pain Edu? --      Excl. in GC? --    Constitutional: Alert and oriented. Well appearing and in no distress. Eyes: Conjunctivae are normal. PERRL. Normal extraocular movements. ENT   Head: Normocephalic and atraumatic.   Nose: No congestion/rhinnorhea.   Mouth/Throat: Mucous membranes are moist.   Neck: No stridor. Musculoskeletal: Nontender with normal range of motion in all extremities. No joint effusions.  No lower extremity tenderness nor edema. Neurologic:  No gross focal neurologic deficits are appreciated. No gait instability. Skin:  Skin is warm, dry and intact. No rash noted. Patient with a localized collection of pus to the medial thumbnail on the right hand. There is some minimal local erythema appreciated Psychiatric: Mood and affect are normal. Speech and behavior are normal. Patient exhibits appropriate insight and judgment. ____________________________________________  PROCEDURES  Procedure(s) performed: Incision & drainage of Right Thumb paronychia     Thumb prepped with chlorhex wipe     Local pus collection drained using a sterile 18G needle     Moderate amount of purulent material released from the cuticle and lateral nail bed.     Patient tolerated the procedure without difficulty  Critical Care performed:  No ____________________________________________  INITIAL IMPRESSION / ASSESSMENT AND PLAN / ED COURSE  Right Thumb Paronychia s/p I & D. Wound care instructions given.   Pertinent labs & imaging results that were available during my care of the patient were reviewed by me and considered in my medical decision making (see chart for details). ____________________________________________  FINAL CLINICAL IMPRESSION(S) / ED  DIAGNOSES  Final diagnoses:  Paronychia of right thumb     Lissa HoardJenise V Bacon Ramiel Forti, PA-C 05/14/15 1254  Jene Everyobert Kinner, MD 05/14/15 1539

## 2015-06-09 ENCOUNTER — Other Ambulatory Visit: Payer: Self-pay | Admitting: Internal Medicine

## 2015-06-09 ENCOUNTER — Encounter: Payer: Self-pay | Admitting: Internal Medicine

## 2015-06-09 DIAGNOSIS — I10 Essential (primary) hypertension: Secondary | ICD-10-CM | POA: Insufficient documentation

## 2015-06-09 DIAGNOSIS — K219 Gastro-esophageal reflux disease without esophagitis: Secondary | ICD-10-CM | POA: Insufficient documentation

## 2015-06-09 DIAGNOSIS — M539 Dorsopathy, unspecified: Secondary | ICD-10-CM | POA: Insufficient documentation

## 2015-06-09 DIAGNOSIS — M109 Gout, unspecified: Secondary | ICD-10-CM | POA: Insufficient documentation

## 2015-06-09 DIAGNOSIS — E1165 Type 2 diabetes mellitus with hyperglycemia: Secondary | ICD-10-CM

## 2015-06-09 DIAGNOSIS — IMO0001 Reserved for inherently not codable concepts without codable children: Secondary | ICD-10-CM | POA: Insufficient documentation

## 2015-06-09 DIAGNOSIS — E1169 Type 2 diabetes mellitus with other specified complication: Secondary | ICD-10-CM | POA: Insufficient documentation

## 2015-06-09 DIAGNOSIS — E785 Hyperlipidemia, unspecified: Secondary | ICD-10-CM

## 2015-06-09 DIAGNOSIS — F17200 Nicotine dependence, unspecified, uncomplicated: Secondary | ICD-10-CM | POA: Insufficient documentation

## 2015-07-14 ENCOUNTER — Other Ambulatory Visit: Payer: Self-pay | Admitting: Internal Medicine

## 2015-07-17 ENCOUNTER — Other Ambulatory Visit: Payer: Self-pay | Admitting: Internal Medicine

## 2015-07-27 ENCOUNTER — Encounter: Payer: Self-pay | Admitting: Internal Medicine

## 2015-07-27 ENCOUNTER — Ambulatory Visit (INDEPENDENT_AMBULATORY_CARE_PROVIDER_SITE_OTHER): Payer: Medicare Other | Admitting: Internal Medicine

## 2015-07-27 VITALS — BP 110/70 | HR 76 | Ht 69.0 in | Wt 211.8 lb

## 2015-07-27 DIAGNOSIS — E785 Hyperlipidemia, unspecified: Secondary | ICD-10-CM | POA: Diagnosis not present

## 2015-07-27 DIAGNOSIS — I10 Essential (primary) hypertension: Secondary | ICD-10-CM

## 2015-07-27 DIAGNOSIS — E119 Type 2 diabetes mellitus without complications: Secondary | ICD-10-CM

## 2015-07-27 DIAGNOSIS — I25119 Atherosclerotic heart disease of native coronary artery with unspecified angina pectoris: Secondary | ICD-10-CM

## 2015-07-27 NOTE — Progress Notes (Signed)
Date:  07/27/2015   Name:  Gary Gutierrez   DOB:  09/19/1951   MRN:  [id number]   Chief Complaint: Diabetes; Hyperlipidemia; and Hypertension Diabetes He presents for his follow-up diabetic visit. He has type 2 diabetes mellitus. His disease course has been worsening. There are no hypoglycemic associated symptoms. Pertinent negatives for hypoglycemia include no confusion, dizziness or headaches. Pertinent negatives for diabetes include no chest pain, no polydipsia and no polyuria. Symptoms are stable. Current diabetic treatment includes oral agent (triple therapy). He is compliant with treatment some of the time (he stopped taking the second dose of janumet because he was taking it at bedtime and having hypoglycemic episodes). He is following a generally unhealthy (too many carbs and fast food) diet. He has not had a previous visit with a dietitian (he doesnt think he will be able to understand what they will be teaching). His home blood glucose trend is increasing steadily. His breakfast blood glucose is taken between 7-8 am. His breakfast blood glucose range is generally 180-200 mg/dl. An ACE inhibitor/angiotensin II receptor blocker is being taken.  Hyperlipidemia This is a new problem. The current episode started more than 1 year ago. The problem is controlled. Recent lipid tests were reviewed and are normal. Exacerbating diseases include diabetes. Associated symptoms include shortness of breath. Pertinent negatives include no chest pain. The current treatment provides significant improvement of lipids.  Hypertension This is a chronic problem. The current episode started more than 1 year ago. The problem is unchanged. The problem is controlled. Associated symptoms include shortness of breath. Pertinent negatives include no chest pain, headaches or palpitations. Compliance problems include diet.   CAD - stable mild angina symptoms.  Recent visit with Dr. Juliann Pares.  No medication changes  made.  Review of Systems:  Review of Systems  Constitutional: Positive for unexpected weight change. Negative for fever.  HENT: Negative for hearing loss.   Eyes: Negative for visual disturbance.  Respiratory: Positive for shortness of breath. Negative for chest tightness.   Cardiovascular: Negative for chest pain, palpitations and leg swelling.  Gastrointestinal: Negative for abdominal pain.  Endocrine: Negative for polydipsia and polyuria.  Genitourinary: Negative for dysuria and frequency.  Neurological: Negative for dizziness, numbness and headaches.  Hematological: Bruises/bleeds easily.  Psychiatric/Behavioral: Negative for confusion and dysphoric mood.    Patient Active Problem List   Diagnosis Date Noted  . Diabetes 06/09/2015  . Acid reflux 06/09/2015  . HLD (hyperlipidemia) 06/09/2015  . Essential hypertension 06/09/2015  . Controlled gout 06/09/2015  . Multilevel degenerative disc disease 06/09/2015  . Acute non-ST segment elevation myocardial infarction 05/11/2014  . Coronary artery disease involving native coronary artery of native heart with angina pectoris 05/09/2014    Prior to Admission medications   Medication Sig Start Date End Date Taking? Authorizing Provider  ACCU-CHEK AVIVA PLUS test strip Place 1 each onto the skin daily. 04/05/15  Yes Historical Provider, MD  allopurinol (ZYLOPRIM) 300 MG tablet Take 1 tablet by mouth daily. 04/28/15  Yes Historical Provider, MD  aspirin EC 81 MG tablet Take 81 mg by mouth daily.   Yes Historical Provider, MD  atorvastatin (LIPITOR) 80 MG tablet TAKE 1 TABLET BY MOUTH AT BEDTIME 07/17/15  Yes [name], MD  carvedilol (COREG) 25 MG tablet Take 1 tablet by mouth 2 (two) times daily. 04/17/15  Yes Historical Provider, MD  clopidogrel (PLAVIX) 75 MG tablet Take 1 tablet by mouth daily. 04/17/15  Yes Historical Provider, MD  glipiZIDE (GLUCOTROL XL)  10 MG 24 hr tablet TAKE 1 TABLET BY MOUTH EVERY DAY 07/14/15  Yes [name], MD  JANUMET 50-1000 MG per tablet Take 1 tablet by mouth 2 (two) times daily. 05/05/15  Yes Historical Provider, MD  lisinopril (PRINIVIL,ZESTRIL) 5 MG tablet Take 1 tablet by mouth daily. 04/17/15  Yes Historical Provider, MD  pantoprazole (PROTONIX) 40 MG tablet Take 1 tablet by mouth daily. 05/05/15  Yes Historical Provider, MD    Allergies  Allergen Reactions  . Penicillins Other (See Comments)    Other reaction(s): Unknown    Past Surgical History  Procedure Laterality Date  . Appendectomy    . Colonscopy  2010    benign polyps  . Esophagogastroduodenoscopy  2010    normal  . Coronary artery bypass graft  1995  . Percutaneous coronary stent intervention (pci-s)  2010, 2015    4 stents total    History  Substance Use Topics  . Smoking status: Former Games developer  . Smokeless tobacco: Not on file  . Alcohol Use: No    Medication list has been reviewed and updated.  Physical Examination:  Physical Exam  Constitutional: He is oriented to person, place, and time. He appears well-developed and well-nourished. No distress.  HENT:  Head: Normocephalic and atraumatic.  Eyes: Conjunctivae are normal. Right eye exhibits no discharge. Left eye exhibits no discharge. No scleral icterus.  Neck: Normal range of motion. Neck supple. No thyromegaly present.  Cardiovascular: Normal rate, regular rhythm and normal heart sounds.   Pulmonary/Chest: Effort normal and breath sounds normal. No respiratory distress. He has no wheezes.  Musculoskeletal: Normal range of motion. He exhibits no edema or tenderness.  Neurological: He is alert and oriented to person, place, and time.  Skin: Skin is warm and dry. No rash noted.  Psychiatric: He has a normal mood and affect. His behavior is normal. Thought content normal.  BP 110/70 mmHg  Pulse 76  Ht  (1.753 m)  Wt 211 lb 12.8 oz (96.072 kg)  BMI 31.26 kg/m2  Assessment and Plan: 1. Type 2 diabetes mellitus without complication Long  discussion regarding diet in DM Will not refer to dietician per patient preference Change Janumet dosing to before breakfast and supper daily - Hemoglobin A1c  2. Arteriosclerosis of autologous vein coronary artery bypass graft Stable angina; followed by Dr. Juliann Pares Continue current medication  3. Essential hypertension The current medical regimen is effective;  continue present plan and medications.  4. HLD (hyperlipidemia) On statin therapy   Bari Edward, MD [facility]  07/27/2015

## 2015-07-28 LAB — HEMOGLOBIN A1C
Est. average glucose Bld gHb Est-mCnc: 169 mg/dL
HEMOGLOBIN A1C: 7.5 % — AB (ref 4.8–5.6)

## 2015-08-08 ENCOUNTER — Other Ambulatory Visit: Payer: Self-pay | Admitting: Internal Medicine

## 2015-08-28 ENCOUNTER — Other Ambulatory Visit: Payer: Self-pay | Admitting: Internal Medicine

## 2015-09-25 ENCOUNTER — Other Ambulatory Visit: Payer: Self-pay | Admitting: Internal Medicine

## 2015-09-26 ENCOUNTER — Other Ambulatory Visit: Payer: Self-pay | Admitting: Internal Medicine

## 2015-11-09 DIAGNOSIS — I10 Essential (primary) hypertension: Secondary | ICD-10-CM | POA: Diagnosis not present

## 2015-11-09 DIAGNOSIS — E119 Type 2 diabetes mellitus without complications: Secondary | ICD-10-CM | POA: Diagnosis not present

## 2015-11-09 DIAGNOSIS — I429 Cardiomyopathy, unspecified: Secondary | ICD-10-CM | POA: Diagnosis not present

## 2015-11-09 DIAGNOSIS — M1990 Unspecified osteoarthritis, unspecified site: Secondary | ICD-10-CM | POA: Diagnosis not present

## 2015-11-09 DIAGNOSIS — I209 Angina pectoris, unspecified: Secondary | ICD-10-CM | POA: Diagnosis not present

## 2015-11-09 DIAGNOSIS — Z87898 Personal history of other specified conditions: Secondary | ICD-10-CM | POA: Diagnosis not present

## 2015-11-09 DIAGNOSIS — I25118 Atherosclerotic heart disease of native coronary artery with other forms of angina pectoris: Secondary | ICD-10-CM | POA: Diagnosis not present

## 2015-11-09 DIAGNOSIS — K219 Gastro-esophageal reflux disease without esophagitis: Secondary | ICD-10-CM | POA: Diagnosis not present

## 2015-11-09 DIAGNOSIS — Z951 Presence of aortocoronary bypass graft: Secondary | ICD-10-CM | POA: Diagnosis not present

## 2015-11-09 DIAGNOSIS — E784 Other hyperlipidemia: Secondary | ICD-10-CM | POA: Diagnosis not present

## 2015-11-30 ENCOUNTER — Other Ambulatory Visit: Payer: Self-pay | Admitting: Internal Medicine

## 2015-12-15 ENCOUNTER — Other Ambulatory Visit: Payer: Self-pay | Admitting: Internal Medicine

## 2015-12-18 ENCOUNTER — Other Ambulatory Visit: Payer: Self-pay | Admitting: Internal Medicine

## 2015-12-19 NOTE — Telephone Encounter (Signed)
pts coming in on 1/4 for his f/u dm

## 2016-01-03 ENCOUNTER — Ambulatory Visit (INDEPENDENT_AMBULATORY_CARE_PROVIDER_SITE_OTHER): Payer: Medicare Other | Admitting: Family Medicine

## 2016-01-03 ENCOUNTER — Encounter: Payer: Self-pay | Admitting: Family Medicine

## 2016-01-03 VITALS — BP 132/76 | HR 72 | Ht 69.0 in | Wt 215.2 lb

## 2016-01-03 DIAGNOSIS — Z23 Encounter for immunization: Secondary | ICD-10-CM

## 2016-01-03 DIAGNOSIS — E785 Hyperlipidemia, unspecified: Secondary | ICD-10-CM

## 2016-01-03 DIAGNOSIS — H9193 Unspecified hearing loss, bilateral: Secondary | ICD-10-CM

## 2016-01-03 DIAGNOSIS — I25119 Atherosclerotic heart disease of native coronary artery with unspecified angina pectoris: Secondary | ICD-10-CM

## 2016-01-03 DIAGNOSIS — K219 Gastro-esophageal reflux disease without esophagitis: Secondary | ICD-10-CM

## 2016-01-03 DIAGNOSIS — E1165 Type 2 diabetes mellitus with hyperglycemia: Secondary | ICD-10-CM

## 2016-01-03 DIAGNOSIS — I10 Essential (primary) hypertension: Secondary | ICD-10-CM | POA: Diagnosis not present

## 2016-01-03 DIAGNOSIS — M109 Gout, unspecified: Secondary | ICD-10-CM | POA: Diagnosis not present

## 2016-01-03 DIAGNOSIS — IMO0001 Reserved for inherently not codable concepts without codable children: Secondary | ICD-10-CM

## 2016-01-03 NOTE — Progress Notes (Signed)
Date:  01/03/2016   Name:  Gary Gutierrez   DOB:  November 26, 1951   MRN:  [id number]  PCP:  Alwyn PeaALLWOOD,DWAYNE D., MD    Chief Complaint: Follow-up and Diabetes   History of Present Illness:  This is a 65 y.o. male patient of Dr. Karn CassisBerglund's seen for f/u T2DM, HTN, HLD, GERD, and gout. Has known CAD s/p CABG and multiple stents, most recently in 2015. On both Plavix and asa per cardiology but c/o easy bruising since starting asa so has cut back to qod and wonders if can stop. No recent cardiac sxs. Last a1c 7.5% in July, forgets to take Janumet before evening meal and gets shaky and sweaty overnight if takes dose at bedtime, sugars usually around 150. Takes allopurinol for gout and can tell when stops for few days. C/o B hearing loss, worse lately, no recent hearing tests. Takes Nexium for GERD per cardiology, has severe sxs when stops. Not sure why taking Mobic but does have intermittent neck and back pain. Needs flu imm, states has had pneumonia shot but not sure when or which one.  Review of Systems:  Review of Systems  Constitutional: Negative for fever.  Respiratory: Negative for shortness of breath.   Cardiovascular: Negative for chest pain and leg swelling.  Gastrointestinal: Negative for abdominal pain.  Endocrine: Negative for polyuria.  Genitourinary: Negative for difficulty urinating.  Neurological: Negative for syncope and light-headedness.    Patient Active Problem List   Diagnosis Date Noted  . Diabetes mellitus type 2, uncontrolled, without complications (HCC) 06/09/2015  . Acid reflux 06/09/2015  . HLD (hyperlipidemia) 06/09/2015  . Essential hypertension 06/09/2015  . Controlled gout 06/09/2015  . Multilevel degenerative disc disease 06/09/2015  . Acute non-ST segment elevation myocardial infarction (HCC) 05/11/2014  . Coronary artery disease involving native coronary artery of native heart with angina pectoris (HCC) 05/09/2014    Prior to Admission medications    Medication Sig Start Date End Date Taking? Authorizing Provider  ACCU-CHEK AVIVA PLUS test strip TEST WITH 1 STRIP DAILY 12/15/15  Yes Reubin MilanLaura H Berglund, MD  allopurinol (ZYLOPRIM) 300 MG tablet TAKE 1 TABLET BY MOUTH EVERY DAY 11/30/15  Yes Reubin MilanLaura H Berglund, MD  aspirin EC 81 MG tablet Take 81 mg by mouth daily.   Yes Historical Provider, MD  atorvastatin (LIPITOR) 80 MG tablet TAKE 1 TABLET BY MOUTH AT BEDTIME 12/15/15  Yes Reubin MilanLaura H Berglund, MD  carvedilol (COREG) 25 MG tablet TAKE 1 TABLET BY MOUTH TWICE DAILY 12/15/15  Yes Reubin MilanLaura H Berglund, MD  cetirizine (ZYRTEC) 10 MG tablet Take 1 tablet by mouth daily. 11/09/15  Yes Historical Provider, MD  clopidogrel (PLAVIX) 75 MG tablet TAKE 1 TABLET BY MOUTH EVERY DAY 09/25/15  Yes Reubin MilanLaura H Berglund, MD  glipiZIDE (GLUCOTROL XL) 10 MG 24 hr tablet TAKE 1 TABLET BY MOUTH EVERY DAY 08/28/15  Yes Reubin MilanLaura H Berglund, MD  JANUMET 50-1000 MG tablet TAKE 1 TABLET BY MOUTH TWICE DAILY 12/18/15  Yes Reubin MilanLaura H Berglund, MD  lisinopril (PRINIVIL,ZESTRIL) 5 MG tablet TAKE 1 TABLET BY MOUTH EVERY DAY 09/25/15  Yes Reubin MilanLaura H Berglund, MD  meloxicam (MOBIC) 15 MG tablet TAKE 1 TABLET BY MOUTH DAILY AS NEEDED FOR OA 09/26/15  Yes Reubin MilanLaura H Berglund, MD  pantoprazole (PROTONIX) 40 MG tablet Take 1 tablet by mouth daily. 05/05/15  Yes Historical Provider, MD    Allergies  Allergen Reactions  . Penicillins Other (See Comments)    Other reaction(s): Unknown    Past Surgical  History  Procedure Laterality Date  . Appendectomy    . Colonscopy  2010    benign polyps  . Esophagogastroduodenoscopy  2010    normal  . Coronary artery bypass graft  1995  . Percutaneous coronary stent intervention (pci-s)  2010, 2015    4 stents total    Social History  Substance Use Topics  . Smoking status: Former Games developer  . Smokeless tobacco: None  . Alcohol Use: No    Family History  Problem Relation Age of Onset  . Diabetes Father   . Diabetes Brother   . Prostate cancer Brother   .  Lung cancer Brother     Medication list has been reviewed and updated.  Physical Examination: BP 132/76 mmHg  Pulse 72  Ht 5\' 9"  (1.753 m)  Wt 215 lb 3.2 oz (97.614 kg)  BMI 31.76 kg/m2  Physical Exam  Constitutional: He appears well-developed and well-nourished.  HENT:  Right Ear: External ear normal.  Left Ear: External ear normal.  TM's clear  Cardiovascular: Normal rate, regular rhythm and normal heart sounds.   Pulmonary/Chest: Effort normal and breath sounds normal.  Musculoskeletal: He exhibits no edema.  Neurological: He is alert.  Skin: Skin is warm and dry.  Psychiatric: He has a normal mood and affect. His behavior is normal.  Nursing note and vitals reviewed.   Assessment and Plan:  1. Uncontrolled type 2 diabetes mellitus without complication, without long-term current use of insulin (HCC) Unclear control, check a1c today, consider change to daily Januvia and daily metformin XR to improve compliance - HgB A1c - Urine Microalbumin w/creat. ratio  2. Coronary artery disease involving native coronary artery of native heart with angina pectoris (HCC) Stable, discussed asa use, should be able to d/c as year out from last stent placement  3. Essential hypertension Well controlled, cont current regimen - Comprehensive Metabolic Panel (CMET) - CBC  4. Gastroesophageal reflux disease, esophagitis presence not specified Discussed risks long-term PPI therapy, pt feels needs to maintain for sx control  5. HLD (hyperlipidemia) Well controlled 09/2014, cont Crestor - Lipid Profile  6. Controlled gout Well controlled, cont allopurinol - Uric acid  7. Flu vaccine need - Flu Vaccine QUAD 36+ mos PF IM (Fluarix & Fluzone Quad PF)  8. Hearing loss Discussed hearing testing given normal exam, pt will schedule  9. Med review Unclear indication for Mobic, will d/c, call if MS sxs flare (d/c will lower bleeding risks and may also improve GERD sxs)  Return in about  3 months (around 04/02/2016).  Dionne Ano. Kingsley Spittle MD [facility]  01/03/2016  Addendum: Reviewed cardiology notes and literature on DAPT, likely makes more sense to maintain asa and d/c Plavix given duration since last stenting and concurrent PPI use, will notify pt with blood test results.

## 2016-01-03 NOTE — Patient Instructions (Signed)
May discontinue meloxicam (Mobic) and aspirin.

## 2016-01-04 ENCOUNTER — Telehealth: Payer: Self-pay

## 2016-01-04 ENCOUNTER — Other Ambulatory Visit: Payer: Self-pay | Admitting: Family Medicine

## 2016-01-04 LAB — COMPREHENSIVE METABOLIC PANEL
ALT: 36 IU/L (ref 0–44)
AST: 23 IU/L (ref 0–40)
Albumin/Globulin Ratio: 1.6 (ref 1.1–2.5)
Albumin: 4.2 g/dL (ref 3.6–4.8)
Alkaline Phosphatase: 97 IU/L (ref 39–117)
BUN/Creatinine Ratio: 15 (ref 10–22)
BUN: 14 mg/dL (ref 8–27)
Bilirubin Total: 0.7 mg/dL (ref 0.0–1.2)
CALCIUM: 9.3 mg/dL (ref 8.6–10.2)
CO2: 24 mmol/L (ref 18–29)
Chloride: 100 mmol/L (ref 96–106)
Creatinine, Ser: 0.91 mg/dL (ref 0.76–1.27)
GFR, EST AFRICAN AMERICAN: 102 mL/min/{1.73_m2} (ref >59)
GFR, EST NON AFRICAN AMERICAN: 88 mL/min/{1.73_m2} (ref >59)
GLUCOSE: 127 mg/dL — AB (ref 65–99)
Globulin, Total: 2.6 g/dL (ref 1.5–4.5)
Potassium: 4.4 mmol/L (ref 3.5–5.2)
Sodium: 141 mmol/L (ref 134–144)
TOTAL PROTEIN: 6.8 g/dL (ref 6.0–8.5)

## 2016-01-04 LAB — MICROALBUMIN / CREATININE URINE RATIO
CREATININE, UR: 39.3 mg/dL
MICROALB/CREAT RATIO: 9.2 mg/g{creat} (ref 0.0–30.0)
MICROALBUM., U, RANDOM: 3.6 ug/mL

## 2016-01-04 LAB — HEMOGLOBIN A1C
ESTIMATED AVERAGE GLUCOSE: 169 mg/dL
HEMOGLOBIN A1C: 7.5 % — AB (ref 4.8–5.6)

## 2016-01-04 LAB — LIPID PANEL
Chol/HDL Ratio: 4.6 ratio units (ref 0.0–5.0)
Cholesterol, Total: 146 mg/dL (ref 100–199)
HDL: 32 mg/dL — ABNORMAL LOW (ref >39)
LDL Calculated: 79 mg/dL (ref 0–99)
Triglycerides: 175 mg/dL — ABNORMAL HIGH (ref 0–149)
VLDL Cholesterol Cal: 35 mg/dL (ref 5–40)

## 2016-01-04 LAB — CBC
HEMATOCRIT: 41.8 % (ref 37.5–51.0)
HEMOGLOBIN: 14.3 g/dL (ref 12.6–17.7)
MCH: 31.6 pg (ref 26.6–33.0)
MCHC: 34.2 g/dL (ref 31.5–35.7)
MCV: 92 fL (ref 79–97)
Platelets: 180 10*3/uL (ref 150–379)
RBC: 4.53 x10E6/uL (ref 4.14–5.80)
RDW: 14.2 % (ref 12.3–15.4)
WBC: 7.7 10*3/uL (ref 3.4–10.8)

## 2016-01-04 LAB — URIC ACID: URIC ACID: 4.2 mg/dL (ref 3.7–8.6)

## 2016-01-04 NOTE — Telephone Encounter (Signed)
Spoke with pt. Pt. Advised of results. Patient informed to stop Plavix and continue Aspirin. Patient Verbalized understanding.

## 2016-01-04 NOTE — Telephone Encounter (Signed)
-----   Message from Schuyler AmorWilliam Plonk, MD sent at 01/04/2016  9:22 AM EST ----- Inform labs ok and diabetes control stable. I reviewed his cardiology notes and recommend he stop clopidogrel (Plavix) and stay on aspirin (was told to stay on Plavix and stop aspirin in office).

## 2016-01-16 ENCOUNTER — Other Ambulatory Visit: Payer: Self-pay | Admitting: Internal Medicine

## 2016-03-02 ENCOUNTER — Other Ambulatory Visit: Payer: Self-pay | Admitting: Internal Medicine

## 2016-03-13 ENCOUNTER — Other Ambulatory Visit: Payer: Self-pay | Admitting: Internal Medicine

## 2016-03-14 ENCOUNTER — Other Ambulatory Visit: Payer: Self-pay | Admitting: Internal Medicine

## 2016-03-15 ENCOUNTER — Other Ambulatory Visit: Payer: Self-pay | Admitting: Internal Medicine

## 2016-04-02 ENCOUNTER — Ambulatory Visit: Payer: Medicare Other | Admitting: Internal Medicine

## 2016-04-02 ENCOUNTER — Encounter: Payer: Self-pay | Admitting: Internal Medicine

## 2016-04-10 ENCOUNTER — Ambulatory Visit (INDEPENDENT_AMBULATORY_CARE_PROVIDER_SITE_OTHER): Payer: Medicare Other | Admitting: Internal Medicine

## 2016-04-10 ENCOUNTER — Encounter: Payer: Self-pay | Admitting: Internal Medicine

## 2016-04-10 VITALS — BP 122/70 | HR 76 | Temp 97.8°F | Resp 16 | Wt 211.0 lb

## 2016-04-10 DIAGNOSIS — M539 Dorsopathy, unspecified: Secondary | ICD-10-CM | POA: Diagnosis not present

## 2016-04-10 DIAGNOSIS — Z23 Encounter for immunization: Secondary | ICD-10-CM

## 2016-04-10 DIAGNOSIS — I10 Essential (primary) hypertension: Secondary | ICD-10-CM | POA: Diagnosis not present

## 2016-04-10 DIAGNOSIS — I25119 Atherosclerotic heart disease of native coronary artery with unspecified angina pectoris: Secondary | ICD-10-CM

## 2016-04-10 DIAGNOSIS — E1165 Type 2 diabetes mellitus with hyperglycemia: Secondary | ICD-10-CM

## 2016-04-10 DIAGNOSIS — Z86010 Personal history of colonic polyps: Secondary | ICD-10-CM | POA: Diagnosis not present

## 2016-04-10 DIAGNOSIS — IMO0001 Reserved for inherently not codable concepts without codable children: Secondary | ICD-10-CM

## 2016-04-10 MED ORDER — MELOXICAM 15 MG PO TABS: 15.0000 mg | ORAL_TABLET | Freq: Every day | ORAL | Status: DC

## 2016-04-10 MED ORDER — SAXAGLIPTIN-METFORMIN ER 2.5-1000 MG PO TB24: 2.0000 | ORAL_TABLET | Freq: Every day | ORAL | Status: DC

## 2016-04-10 MED ORDER — ATORVASTATIN CALCIUM 80 MG PO TABS: 80.0000 mg | ORAL_TABLET | Freq: Every day | ORAL | Status: DC

## 2016-04-10 MED ORDER — SITAGLIP PHOS-METFORMIN HCL ER 50-1000 MG PO TB24: 2.0000 | ORAL_TABLET | Freq: Every day | ORAL | Status: DC

## 2016-04-10 NOTE — Progress Notes (Signed)
Date:  04/10/2016   Name:  Gary Gutierrez   DOB:  [birth year]   MRN:  [id number]   Chief Complaint: Diabetes and Arthritis Diabetes He presents for his follow-up diabetic visit. He has type 2 diabetes mellitus. His disease course has been stable. Pertinent negatives for hypoglycemia include no dizziness or headaches. Pertinent negatives for diabetes include no chest pain and no fatigue. Symptoms are stable. An ACE inhibitor/angiotensin II receptor blocker is being taken.  Hypertension This is a chronic problem. The current episode started more than 1 year ago. The problem is unchanged. The problem is controlled. Pertinent negatives include no chest pain, headaches, palpitations or shortness of breath. The current treatment provides significant improvement. There are no compliance problems.  Hypertensive end-organ damage includes CAD/MI.  Arthritis Presents for follow-up visit. He complains of pain and stiffness. Affected locations include the right shoulder and neck. Pertinent negatives include no diarrhea, dysuria, fatigue, fever or rash. Past treatments include NSAIDs. The treatment provided significant relief.  CAD - he is s/p CABG and several PTCA with stents.  He was on Plavix and Aspirin.  Plavix was stopped last visit.  He ran out of aspirin about a month ago but intends to get back on it.  He struggles to remember to take the second Janumet at supper.  He often forgets and if he takes it later, his blood sugar drops.  He would benefit from having all of his medications dosed once a day at the same time. He is also having more general aches and pains, esp in his neck and shoulder, but also in both knees since stopping Mobic.  He did not notice an improvement in GERD sx.  Lab Results  Component Value Date   HGBA1C 7.5* 01/03/2016     Review of Systems  Constitutional: Negative for fever, chills and fatigue.  Respiratory: Negative for cough, chest tightness, shortness of breath and  wheezing.   Cardiovascular: Negative for chest pain, palpitations and leg swelling.  Gastrointestinal: Negative for abdominal pain, diarrhea and constipation.  Genitourinary: Negative for dysuria and difficulty urinating.  Musculoskeletal: Positive for myalgias, arthralgias, arthritis and stiffness.  Skin: Negative for rash.  Neurological: Negative for dizziness and headaches.  Hematological: Negative for adenopathy.  Psychiatric/Behavioral: Positive for decreased concentration (since CABG). Negative for sleep disturbance and dysphoric mood.    Patient Active Problem List   Diagnosis Date Noted  . Hearing loss of both ears 01/03/2016  . Diabetes mellitus type 2, uncontrolled, without complications (HCC) 06/09/2015  . Acid reflux 06/09/2015  . HLD (hyperlipidemia) 06/09/2015  . Essential hypertension 06/09/2015  . Controlled gout 06/09/2015  . Multilevel degenerative disc disease 06/09/2015  . Coronary artery disease involving native coronary artery of native heart with angina pectoris (HCC) 05/09/2014    Prior to Admission medications   Medication Sig Start Date End Date Taking? Authorizing Provider  ACCU-CHEK AVIVA PLUS test strip USE AS DIRECTED TO TEST BLOOD SUGAR DAILY 03/13/16   [name], MD  allopurinol (ZYLOPRIM) 300 MG tablet TAKE 1 TABLET BY MOUTH EVERY DAY 03/15/16   [name], MD  aspirin EC 81 MG tablet Take 81 mg by mouth daily.    Historical Provider, MD  atorvastatin (LIPITOR) 80 MG tablet TAKE 1 TABLET BY MOUTH AT BEDTIME 12/15/15   [name], MD  carvedilol (COREG) 25 MG tablet TAKE 1 TABLET BY MOUTH TWICE DAILY 12/15/15   [name], MD  carvedilol (COREG) 25 MG tablet TAKE  1 TABLET BY MOUTH TWICE DAILY 01/16/16   Reubin MilanLaura H Carle Fenech, MD  cetirizine (ZYRTEC) 10 MG tablet Take 1 tablet by mouth daily. 11/09/15   Historical Provider, MD  clopidogrel (PLAVIX) 75 MG tablet TAKE 1 TABLET BY MOUTH EVERY DAY 03/13/16   Reubin MilanLaura H Vardaan Depascale, MD    esomeprazole (NEXIUM) 40 MG capsule Take 40 mg by mouth daily at 12 noon.    Historical Provider, MD  glipiZIDE (GLUCOTROL XL) 10 MG 24 hr tablet TAKE 1 TABLET BY MOUTH EVERY DAY 03/14/16   Reubin MilanLaura H Nasim Garofano, MD  JANUMET 50-1000 MG tablet TAKE 1 TABLET BY MOUTH TWICE DAILY 03/02/16   Reubin MilanLaura H Dovie Kapusta, MD  lisinopril (PRINIVIL,ZESTRIL) 5 MG tablet TAKE 1 TABLET BY MOUTH EVERY DAY 03/13/16   Reubin MilanLaura H Tomika Eckles, MD    Allergies  Allergen Reactions  . Penicillins Other (See Comments)    Other reaction(s): Unknown    Past Surgical History  Procedure Laterality Date  . Appendectomy    . Colonscopy  2010    benign polyps  . Esophagogastroduodenoscopy  2010    normal  . Coronary artery bypass graft  1995  . Percutaneous coronary stent intervention (pci-s)  2010, 2015    4 stents total    Social History  Substance Use Topics  . Smoking status: Former Games developermoker  . Smokeless tobacco: None  . Alcohol Use: No     Medication list has been reviewed and updated.   Physical Exam  Constitutional: He is oriented to person, place, and time. He appears well-developed. No distress.  HENT:  Head: Normocephalic and atraumatic.  Neck: Normal range of motion. Neck supple. Carotid bruit is not present. No thyromegaly present.  Cardiovascular: Normal rate, regular rhythm and normal heart sounds.   No murmur heard. Pulmonary/Chest: Effort normal and breath sounds normal. No respiratory distress. He has no wheezes. He has no rales.  Musculoskeletal:       Right shoulder: He exhibits decreased range of motion. He exhibits no tenderness and no bony tenderness.       Cervical back: He exhibits decreased range of motion. He exhibits no bony tenderness, no swelling and no edema.  Lymphadenopathy:    He has no cervical adenopathy.  Neurological: He is alert and oriented to person, place, and time. He has normal strength and normal reflexes. Gait normal.  Skin: Skin is warm and dry. Abrasion (on fingers of left  hand - TDaP is current) noted. No rash noted.  Foot exam - normal sensation, pulses, skin and nails bilaterally  Psychiatric: He has a normal mood and affect. His behavior is normal. Thought content normal.  Nursing note and vitals reviewed.   BP 122/70 mmHg  Pulse 76  Temp(Src) 97.8 F (36.6 C)  Resp 16  Wt 211 lb (95.709 kg)  Assessment and Plan: 1. Uncontrolled type 2 diabetes mellitus without complication, without long-term current use of insulin (HCC) Will change medications to all once a day - Hemoglobin A1c - Saxagliptin-Metformin 2.04-999 MG TB24; Take 2 tablets by mouth daily.  Dispense: 60 tablet; Refill: 3  2. Essential hypertension controlled  3. Coronary artery disease involving native coronary artery of native heart with angina pectoris (HCC) Stable - continue statin therapy and Aspirin - atorvastatin (LIPITOR) 80 MG tablet; Take 1 tablet (80 mg total) by mouth daily.  Dispense: 30 tablet; Refill: 5  4. Multilevel degenerative disc disease Can continue mobic - meloxicam (MOBIC) 15 MG tablet; Take 1 tablet (15 mg total) by mouth  daily.  Dispense: 30 tablet; Refill: 5  5. Need for pneumococcal vaccination - Pneumococcal conjugate vaccine 13-valent IM  6. Hx of colonic polyps Due for follow up colonoscopy in the near future   Bari Edward, MD [facility]  04/10/2016

## 2016-04-10 NOTE — Patient Instructions (Addendum)
Restart Lipitor (atorvastatin) and aspirin.  Diabetic medication changed to 2 pills once a day in the AM   Pneumococcal Conjugate Vaccine (PCV13)  1. Why get vaccinated? Vaccination can protect both children and adults from pneumococcal disease. Pneumococcal disease is caused by bacteria that can spread from person to person through close contact. It can cause ear infections, and it can also lead to more serious infections of the:  Lungs (pneumonia),  Blood (bacteremia), and  Covering of the brain and spinal cord (meningitis). Pneumococcal pneumonia is most common among adults. Pneumococcal meningitis can cause deafness and brain damage, and it kills about 1 child in 10 who get it. Anyone can get pneumococcal disease, but children under 552 years of age and adults 6265 years and older, people with certain medical conditions, and cigarette smokers are at the highest risk. Before there was a vaccine, the [place] saw:  more than 700 cases of meningitis,  about 13,000 blood infections,  about 5 million ear infections, and  about 200 deaths in children under 5 each year from pneumococcal disease. Since vaccine became available, severe pneumococcal disease in these children has fallen by 88%. About 18,000 older adults die of pneumococcal disease each year in the [place]. Treatment of pneumococcal infections with penicillin and other drugs is not as effective as it used to be, because some strains of the disease have become resistant to these drugs. This makes prevention of the disease, through vaccination, even more important. 2. PCV13 vaccine Pneumococcal conjugate vaccine (called PCV13) protects against 13 types of pneumococcal bacteria. PCV13 is routinely given to children at 2, 4, 6, and 7812-4415 months of age. It is also recommended for children and adults 822 to 66 years of age with certain health conditions, and for all adults 66 years of age and older. Your doctor can give you  details. 3. Some people should not get this vaccine Anyone who has ever had a life-threatening allergic reaction to a dose of this vaccine, to an earlier pneumococcal vaccine called PCV7, or to any vaccine containing diphtheria toxoid (for example, DTaP), should not get PCV13. Anyone with a severe allergy to any component of PCV13 should not get the vaccine. Tell your doctor if the person being vaccinated has any severe allergies. If the person scheduled for vaccination is not feeling well, your healthcare provider might decide to reschedule the shot on another day. 4. Risks of a vaccine reaction With any medicine, including vaccines, there is a chance of reactions. These are usually mild and go away on their own, but serious reactions are also possible. Problems reported following PCV13 varied by age and dose in the series. The most common problems reported among children were:  About half became drowsy after the shot, had a temporary loss of appetite, or had redness or tenderness where the shot was given.  About 1 out of 3 had swelling where the shot was given.  About 1 out of 3 had a mild fever, and about 1 in 20 had a fever over 102.12F.  Up to about 8 out of 10 became fussy or irritable. Adults have reported pain, redness, and swelling where the shot was given; also mild fever, fatigue, headache, chills, or muscle pain. Young children who get PCV13 along with inactivated flu vaccine at the same time may be at increased risk for seizures caused by fever. Ask your doctor for more information. Problems that could happen after any vaccine:  People sometimes faint after a medical procedure,  including vaccination. Sitting or lying down for about 15 minutes can help prevent fainting, and injuries caused by a fall. Tell your doctor if you feel dizzy, or have vision changes or ringing in the ears.  Some older children and adults get severe pain in the shoulder and have difficulty moving the arm  where a shot was given. This happens very rarely.  Any medication can cause a severe allergic reaction. Such reactions from a vaccine are very rare, estimated at about 1 in a million doses, and would happen within a few minutes to a few hours after the vaccination. As with any medicine, there is a very small chance of a vaccine causing a serious injury or death. The safety of vaccines is always being monitored. For more information, visit: http://floyd.org/ 5. What if there is a serious reaction? What should I look for?  Look for anything that concerns you, such as signs of a severe allergic reaction, very high fever, or unusual behavior. Signs of a severe allergic reaction can include hives, swelling of the face and throat, difficulty breathing, a fast heartbeat, dizziness, and weakness-usually within a few minutes to a few hours after the vaccination. What should I do?  If you think it is a severe allergic reaction or other emergency that can't wait, call 9-1-1 or get the person to the nearest hospital. Otherwise, call your doctor. Reactions should be reported to the Vaccine Adverse Event Reporting System (VAERS). Your doctor should file this report, or you can do it yourself through the VAERS web site at www.vaers.LAgents.no, or by calling 1-(210) 476-0275. VAERS does not give medical advice. 6. The National Vaccine Injury Compensation Program The [employer] Vaccine Injury Compensation Program (VICP) is a federal program that was created to compensate people who may have been injured by certain vaccines. Persons who believe they may have been injured by a vaccine can learn about the program and about filing a claim by calling 1-607-514-1034 or visiting the VICP website at SpiritualWord.at. There is a time limit to file a claim for compensation. 7. How can I learn more?  Ask your healthcare provider. He or she can give you the vaccine package insert or suggest other sources of  information.  Call your local or state health department.  Contact the Centers for Disease Control and Prevention (CDC):  Call 412-609-2127 (1-800-CDC-INFO) or  Visit CDC's website at PicCapture.uy Vaccine Information Statement PCV13 Vaccine (11/03/2014)   This information is not intended to replace advice given to you by your health care provider. Make sure you discuss any questions you have with your health care provider.   Document Released: 10/13/2006 Document Revised: 01/06/2015 Document Reviewed: 11/10/2014 Elsevier Interactive Patient Education [employer]

## 2016-04-11 ENCOUNTER — Other Ambulatory Visit: Payer: Self-pay | Admitting: Internal Medicine

## 2016-04-11 LAB — HEMOGLOBIN A1C
ESTIMATED AVERAGE GLUCOSE: 174 mg/dL
Hgb A1c MFr Bld: 7.7 % — ABNORMAL HIGH (ref 4.8–5.6)

## 2016-04-13 ENCOUNTER — Other Ambulatory Visit: Payer: Self-pay | Admitting: Internal Medicine

## 2016-04-15 ENCOUNTER — Telehealth: Payer: Self-pay

## 2016-04-15 NOTE — Telephone Encounter (Signed)
-----   Message from Reubin MilanLaura H Berglund, MD sent at 04/11/2016  8:06 AM EDT ----- DM is stable.  Take medications as discussed.

## 2016-04-15 NOTE — Telephone Encounter (Signed)
Spoke with patient. Patient advised of all results and verbalized understanding. Will call back with any future questions or concerns. MAH  

## 2016-04-17 ENCOUNTER — Other Ambulatory Visit: Payer: Self-pay | Admitting: Internal Medicine

## 2016-04-22 ENCOUNTER — Other Ambulatory Visit: Payer: Self-pay | Admitting: Internal Medicine

## 2016-04-29 DIAGNOSIS — Z87898 Personal history of other specified conditions: Secondary | ICD-10-CM | POA: Diagnosis not present

## 2016-04-29 DIAGNOSIS — E669 Obesity, unspecified: Secondary | ICD-10-CM | POA: Diagnosis not present

## 2016-04-29 DIAGNOSIS — K219 Gastro-esophageal reflux disease without esophagitis: Secondary | ICD-10-CM | POA: Diagnosis not present

## 2016-04-29 DIAGNOSIS — I25118 Atherosclerotic heart disease of native coronary artery with other forms of angina pectoris: Secondary | ICD-10-CM | POA: Diagnosis not present

## 2016-04-29 DIAGNOSIS — M1990 Unspecified osteoarthritis, unspecified site: Secondary | ICD-10-CM | POA: Diagnosis not present

## 2016-04-29 DIAGNOSIS — Z951 Presence of aortocoronary bypass graft: Secondary | ICD-10-CM | POA: Diagnosis not present

## 2016-04-29 DIAGNOSIS — I429 Cardiomyopathy, unspecified: Secondary | ICD-10-CM | POA: Diagnosis not present

## 2016-04-29 DIAGNOSIS — I10 Essential (primary) hypertension: Secondary | ICD-10-CM | POA: Diagnosis not present

## 2016-04-29 DIAGNOSIS — E784 Other hyperlipidemia: Secondary | ICD-10-CM | POA: Diagnosis not present

## 2016-04-29 DIAGNOSIS — E119 Type 2 diabetes mellitus without complications: Secondary | ICD-10-CM | POA: Diagnosis not present

## 2016-04-29 DIAGNOSIS — I209 Angina pectoris, unspecified: Secondary | ICD-10-CM | POA: Diagnosis not present

## 2016-05-15 ENCOUNTER — Other Ambulatory Visit: Payer: Self-pay | Admitting: Internal Medicine

## 2016-08-12 ENCOUNTER — Encounter: Payer: Self-pay | Admitting: Internal Medicine

## 2016-08-12 ENCOUNTER — Ambulatory Visit (INDEPENDENT_AMBULATORY_CARE_PROVIDER_SITE_OTHER): Payer: Medicare Other | Admitting: Internal Medicine

## 2016-08-12 VITALS — BP 130/80 | HR 64 | Ht 69.0 in | Wt 210.0 lb

## 2016-08-12 DIAGNOSIS — E1165 Type 2 diabetes mellitus with hyperglycemia: Secondary | ICD-10-CM

## 2016-08-12 DIAGNOSIS — E1159 Type 2 diabetes mellitus with other circulatory complications: Secondary | ICD-10-CM | POA: Insufficient documentation

## 2016-08-12 DIAGNOSIS — R233 Spontaneous ecchymoses: Secondary | ICD-10-CM

## 2016-08-12 DIAGNOSIS — K219 Gastro-esophageal reflux disease without esophagitis: Secondary | ICD-10-CM

## 2016-08-12 DIAGNOSIS — IMO0001 Reserved for inherently not codable concepts without codable children: Secondary | ICD-10-CM

## 2016-08-12 DIAGNOSIS — I10 Essential (primary) hypertension: Secondary | ICD-10-CM

## 2016-08-12 DIAGNOSIS — I25119 Atherosclerotic heart disease of native coronary artery with unspecified angina pectoris: Secondary | ICD-10-CM

## 2016-08-12 MED ORDER — CARVEDILOL 25 MG PO TABS: 25.0000 mg | ORAL_TABLET | Freq: Two times a day (BID) | ORAL | 3 refills | Status: DC

## 2016-08-12 NOTE — Progress Notes (Signed)
Date:  08/12/2016   Name:  Gary Gutierrez   DOB:  [birth year]   MRN:  [id number]   Chief Complaint: Diabetes and Follow-up (meds) CAD - followed by Cardiology.  Needs refill on Coreg.  No chest pain other than reflux, no DOE, PND or arm/jaw pain.  He is now off Plavix but continues aspirin.  He takes it every other day due to excessive bruising.   Diabetes  He presents for his follow-up diabetic visit. He has type 2 diabetes mellitus. His disease course has been stable. Pertinent negatives for hypoglycemia include no headaches or tremors. Pertinent negatives for diabetes include no chest pain, no fatigue, no polydipsia and no polyuria. Symptoms are stable. Current diabetic treatment includes oral agent (triple therapy). He is compliant with treatment most of the time. An ACE inhibitor/angiotensin II receptor blocker is being taken. Eye exam is not current.  Hypertension  This is a chronic problem. The current episode started today. The problem is unchanged. The problem is controlled. Pertinent negatives include no chest pain, headaches, palpitations or shortness of breath. Past treatments include beta blockers and ACE inhibitors.  Gastroesophageal Reflux  He complains of belching and heartburn. He reports no abdominal pain, no chest pain, no coughing or no wheezing. This is a recurrent problem. The problem occurs rarely. The heartburn duration is several minutes. Pertinent negatives include no fatigue. He has tried a PPI for the symptoms.    Lab Results  Component Value Date   HGBA1C 7.7 (H) 04/10/2016     Review of Systems  Constitutional: Negative for appetite change, fatigue and unexpected weight change.  Eyes: Negative for visual disturbance.  Respiratory: Negative for cough, shortness of breath and wheezing.   Cardiovascular: Negative for chest pain, palpitations and leg swelling.  Gastrointestinal: Positive for heartburn. Negative for abdominal pain and blood in stool.    Endocrine: Negative for polydipsia and polyuria.  Genitourinary: Negative for dysuria and hematuria.  Skin: Positive for color change (bruising from aspirin therapy). Negative for rash.  Neurological: Negative for tremors, numbness and headaches.  Psychiatric/Behavioral: Negative for dysphoric mood and sleep disturbance.    Patient Active Problem List   Diagnosis Date Noted  . Uncontrolled type 2 diabetes mellitus without complication, without long-term current use of insulin (HCC) 08/12/2016  . Hx of colonic polyps 04/10/2016  . Hearing loss of both ears 01/03/2016  . Acid reflux 06/09/2015  . HLD (hyperlipidemia) 06/09/2015  . Essential hypertension 06/09/2015  . Controlled gout 06/09/2015  . Multilevel degenerative disc disease 06/09/2015  . Coronary artery disease involving native coronary artery of native heart with angina pectoris (HCC) 05/09/2014    Prior to Admission medications   Medication Sig Start Date End Date Taking? Authorizing Provider  ACCU-CHEK AVIVA PLUS test strip USE AS DIRECTED TO TEST BLOOD SUGAR DAILY 03/13/16  Yes Reubin MilanLaura H Neiko Trivedi, MD  allopurinol (ZYLOPRIM) 300 MG tablet TAKE 1 TABLET BY MOUTH EVERY DAY 04/17/16  Yes Reubin MilanLaura H Clemon Devaul, MD  aspirin EC 81 MG tablet Take 81 mg by mouth daily. Reported on 04/10/2016   Yes Historical Provider, MD  atorvastatin (LIPITOR) 80 MG tablet TAKE 1 TABLET BY MOUTH AT BEDTIME 04/22/16  Yes Reubin MilanLaura H Franki Stemen, MD  cetirizine (ZYRTEC) 10 MG tablet Take 1 tablet by mouth daily. 11/09/15  Yes Historical Provider, MD  esomeprazole (NEXIUM) 40 MG capsule Take 40 mg by mouth daily at 12 noon.   Yes Historical Provider, MD  glipiZIDE (GLUCOTROL XL) 10 MG 24 hr  tablet TAKE 1 TABLET BY MOUTH EVERY DAY 04/13/16  Yes Reubin MilanLaura H Amir Glaus, MD  lisinopril (PRINIVIL,ZESTRIL) 5 MG tablet TAKE 1 TABLET BY MOUTH EVERY DAY 04/11/16  Yes Reubin MilanLaura H Tomi Grandpre, MD  meloxicam (MOBIC) 15 MG tablet Take 1 tablet (15 mg total) by mouth daily. 04/10/16  Yes Reubin MilanLaura H  Kymir Coles, MD  Saxagliptin-Metformin 2.04-999 MG TB24 Take 2 tablets by mouth daily. 04/10/16  Yes Reubin MilanLaura H Davonne Jarnigan, MD  carvedilol (COREG) 25 MG tablet TAKE 1 TABLET BY MOUTH TWICE DAILY Patient not taking: Reported on 08/12/2016 12/15/15   Reubin MilanLaura H Joelynn Dust, MD    Allergies  Allergen Reactions  . Penicillins Other (See Comments)    Other reaction(s): Unknown    Past Surgical History:  Procedure Laterality Date  . APPENDECTOMY    . colonscopy  2010   benign polyps  . CORONARY ARTERY BYPASS GRAFT  1995  . ESOPHAGOGASTRODUODENOSCOPY  2010   normal  . PERCUTANEOUS CORONARY STENT INTERVENTION (PCI-S)  2010, 2015   4 stents total    Social History  Substance Use Topics  . Smoking status: Former Games developermoker  . Smokeless tobacco: Never Used  . Alcohol use No     Medication list has been reviewed and updated.   Physical Exam  Constitutional: He is oriented to person, place, and time. He appears well-developed. No distress.  HENT:  Head: Normocephalic and atraumatic.  Neck: Normal range of motion. Neck supple. No thyromegaly present.  Cardiovascular: Normal rate, regular rhythm and normal heart sounds.   Pulmonary/Chest: Effort normal and breath sounds normal. No respiratory distress. He has no wheezes.  Musculoskeletal: Normal range of motion. He exhibits no edema or tenderness.  Neurological: He is alert and oriented to person, place, and time.  Skin: Skin is warm and dry. Bruising noted. No rash noted.  Psychiatric: He has a normal mood and affect. His behavior is normal. Thought content normal.    BP 130/80   Pulse 64   Ht 5\' 9"  (1.753 m)   Wt 210 lb (95.3 kg)   BMI 31.01 kg/m   Assessment and Plan: 1. Uncontrolled type 2 diabetes mellitus without complication, without long-term current use of insulin (HCC) Continue current medications - adjust or add if needed - Hemoglobin A1c  2. Essential hypertension controlled  3. Coronary artery disease involving native  coronary artery of native heart with angina pectoris (HCC) Stable, refill Coreg  4. Senile ecchymosis Continue aspirin qod  5. Gastroesophageal reflux disease without esophagitis Controlled with nexium Due for colonoscopy and ?EGD - will refer next visit    Bari EdwardLaura Javiel Canepa, MD [facility]  08/12/2016

## 2016-08-12 NOTE — Patient Instructions (Signed)
Diabetic Retinopathy Diabetic retinopathy is a disease of the light-sensitive membrane at the back of the eye (retina). It is a complication of diabetes and a common cause of blindness. Early detection of the disease is key to keeping your eyes healthy.  CAUSES  Diabetic retinopathy is caused by blood sugar (glucose) levels that are too high over an extended period of time. High blood sugars cause damage to the small blood vessels of the retina, allowing blood to leak through the vessel walls. This causes visual impairment and eventually blindness. RISK FACTORS  High blood pressure.  Having diabetes for a long time.  Having poorly controlled blood sugars. SIGNS AND SYMPTOMS  In the early stages of diabetic retinopathy, there are often no symptoms. As the condition advances, symptoms may include:  Blurred vision. This is usually caused by a swelling due to abnormal blood glucose levels. The blurriness may go away when blood glucose levels return to normal.  Moving specks or dark spots (floaters) in your vision. These can be caused by a small retinal hemorrhage. A hemorrhage is bleeding from blood vessels.  Missing parts of your field of vision, such as things at the side. This can be caused by larger retinal hemorrhages.  Difficulty reading books or signs.  Double vision.  Pain in one or both eyes.  Feeling pressure in one or both eyes.  Trouble seeing straight lines. Straight lines do not look straight.  Redness of the eyes that does not go away. DIAGNOSIS  Your eye care specialist can detect changes in the blood vessels of your eye by putting drops in your eyes that enlarge (dilate) your pupils. This allows your eye care specialist to get a good look at your retina to see if there are any changes that have occurred as a result of your diabetes. You should have your eyes examined once a year. TREATMENT  Your eye care specialist may use a special laser beam to seal the blood vessels  of the retina and stop them from leaking. Early detection and treatment are important so that further damage to your eyes can be prevented. In addition, managing your blood sugars and keeping them in the target range can slow the progress of the disease. HOME CARE INSTRUCTIONS   Keep your blood pressure within your target range.  Keep your blood glucose levels within your target range.  Follow your health care provider's instructions regarding diet and other means for controlling your blood glucose levels.  Check your blood levels for glucose as recommended by your health care provider.  Keep regular appointments with your eye specialist. An eye specialist can usually see diabetic retinopathy developing long before it starts causing problems. In many cases, it can be treated to prevent complications from occurring. If you have diabetes, you should have your eyes checked at least every year. Your risk of retinopathy increases the longer you have the disease.  If you smoke, quit. Ask your health care provider for help if needed. Smoking can make retinopathy worse. SEEK MEDICAL CARE IF:   You notice gradual blurring or other changes in your vision over time.  You notice that your glasses or contact lenses do not make things look as sharp as they once did.  You have trouble reading or seeing details at a distance with either eye.  You notice a sudden change in your vision or notice that parts of your field of vision appear missing or hazy.  You suddenly see moving specks or dark spots   in the field of vision of either eye.  You have sudden partial loss of vision in either eye.   This information is not intended to replace advice given to you by your health care provider. Make sure you discuss any questions you have with your health care provider.   Document Released: 12/13/2000 Document Revised: 10/06/2013 Document Reviewed: 06/07/2013 Elsevier Interactive Patient Education 2016 Elsevier  Inc.  

## 2016-08-13 LAB — HEMOGLOBIN A1C
ESTIMATED AVERAGE GLUCOSE: 189 mg/dL
Hgb A1c MFr Bld: 8.2 % — ABNORMAL HIGH (ref 4.8–5.6)

## 2016-08-18 ENCOUNTER — Other Ambulatory Visit: Payer: Self-pay | Admitting: Internal Medicine

## 2016-08-30 ENCOUNTER — Other Ambulatory Visit: Payer: Self-pay | Admitting: Internal Medicine

## 2016-08-30 DIAGNOSIS — IMO0001 Reserved for inherently not codable concepts without codable children: Secondary | ICD-10-CM

## 2016-08-30 DIAGNOSIS — E1165 Type 2 diabetes mellitus with hyperglycemia: Principal | ICD-10-CM

## 2016-09-09 ENCOUNTER — Telehealth: Payer: Self-pay

## 2016-09-10 NOTE — Telephone Encounter (Signed)
Spoke to wife

## 2016-11-03 ENCOUNTER — Other Ambulatory Visit: Payer: Self-pay | Admitting: Internal Medicine

## 2016-11-05 DIAGNOSIS — I10 Essential (primary) hypertension: Secondary | ICD-10-CM | POA: Diagnosis not present

## 2016-11-05 DIAGNOSIS — R0602 Shortness of breath: Secondary | ICD-10-CM | POA: Diagnosis not present

## 2016-11-05 DIAGNOSIS — M1990 Unspecified osteoarthritis, unspecified site: Secondary | ICD-10-CM | POA: Diagnosis not present

## 2016-11-05 DIAGNOSIS — K219 Gastro-esophageal reflux disease without esophagitis: Secondary | ICD-10-CM | POA: Diagnosis not present

## 2016-11-05 DIAGNOSIS — Z01818 Encounter for other preprocedural examination: Secondary | ICD-10-CM | POA: Diagnosis not present

## 2016-11-05 DIAGNOSIS — Z951 Presence of aortocoronary bypass graft: Secondary | ICD-10-CM | POA: Diagnosis not present

## 2016-11-05 DIAGNOSIS — E784 Other hyperlipidemia: Secondary | ICD-10-CM | POA: Diagnosis not present

## 2016-11-05 DIAGNOSIS — I209 Angina pectoris, unspecified: Secondary | ICD-10-CM | POA: Diagnosis not present

## 2016-11-05 DIAGNOSIS — M25559 Pain in unspecified hip: Secondary | ICD-10-CM | POA: Diagnosis not present

## 2016-11-05 DIAGNOSIS — I2510 Atherosclerotic heart disease of native coronary artery without angina pectoris: Secondary | ICD-10-CM | POA: Diagnosis not present

## 2016-11-05 DIAGNOSIS — E119 Type 2 diabetes mellitus without complications: Secondary | ICD-10-CM | POA: Diagnosis not present

## 2016-11-05 DIAGNOSIS — I429 Cardiomyopathy, unspecified: Secondary | ICD-10-CM | POA: Diagnosis not present

## 2016-12-04 ENCOUNTER — Other Ambulatory Visit: Payer: Self-pay | Admitting: Internal Medicine

## 2017-01-07 ENCOUNTER — Encounter: Payer: Medicare Other | Admitting: Internal Medicine

## 2017-01-17 ENCOUNTER — Other Ambulatory Visit: Payer: Self-pay | Admitting: Internal Medicine

## 2017-01-17 DIAGNOSIS — M539 Dorsopathy, unspecified: Secondary | ICD-10-CM

## 2017-01-17 NOTE — Telephone Encounter (Signed)
pts coming in on 1/23 for his dm check up and  6/12 for his cpe

## 2017-01-21 ENCOUNTER — Ambulatory Visit: Payer: Medicare Other | Admitting: Internal Medicine

## 2017-01-25 ENCOUNTER — Other Ambulatory Visit: Payer: Self-pay | Admitting: Internal Medicine

## 2017-01-27 NOTE — Telephone Encounter (Signed)
pts coming in on 2/6

## 2017-02-02 ENCOUNTER — Emergency Department: Payer: Medicare Other

## 2017-02-02 ENCOUNTER — Other Ambulatory Visit: Payer: Self-pay | Admitting: Internal Medicine

## 2017-02-02 ENCOUNTER — Encounter: Payer: Self-pay | Admitting: Emergency Medicine

## 2017-02-02 ENCOUNTER — Inpatient Hospital Stay
Admission: EM | Admit: 2017-02-02 | Discharge: 2017-02-04 | DRG: 247 | Disposition: A | Discharge status: Home / Self Care | Payer: Medicare Other | Attending: Internal Medicine | Admitting: Internal Medicine

## 2017-02-02 DIAGNOSIS — E119 Type 2 diabetes mellitus without complications: Secondary | ICD-10-CM | POA: Diagnosis present

## 2017-02-02 DIAGNOSIS — Z951 Presence of aortocoronary bypass graft: Secondary | ICD-10-CM | POA: Diagnosis not present

## 2017-02-02 DIAGNOSIS — Z791 Long term (current) use of non-steroidal anti-inflammatories (NSAID): Secondary | ICD-10-CM | POA: Diagnosis not present

## 2017-02-02 DIAGNOSIS — E785 Hyperlipidemia, unspecified: Secondary | ICD-10-CM | POA: Diagnosis present

## 2017-02-02 DIAGNOSIS — I25119 Atherosclerotic heart disease of native coronary artery with unspecified angina pectoris: Secondary | ICD-10-CM | POA: Diagnosis not present

## 2017-02-02 DIAGNOSIS — Z955 Presence of coronary angioplasty implant and graft: Secondary | ICD-10-CM

## 2017-02-02 DIAGNOSIS — I119 Hypertensive heart disease without heart failure: Secondary | ICD-10-CM | POA: Diagnosis not present

## 2017-02-02 DIAGNOSIS — K219 Gastro-esophageal reflux disease without esophagitis: Secondary | ICD-10-CM | POA: Diagnosis not present

## 2017-02-02 DIAGNOSIS — Z79899 Other long term (current) drug therapy: Secondary | ICD-10-CM

## 2017-02-02 DIAGNOSIS — I214 Non-ST elevation (NSTEMI) myocardial infarction: Principal | ICD-10-CM | POA: Diagnosis present

## 2017-02-02 DIAGNOSIS — Z880 Allergy status to penicillin: Secondary | ICD-10-CM | POA: Diagnosis not present

## 2017-02-02 DIAGNOSIS — I10 Essential (primary) hypertension: Secondary | ICD-10-CM | POA: Diagnosis not present

## 2017-02-02 DIAGNOSIS — I208 Other forms of angina pectoris: Secondary | ICD-10-CM | POA: Diagnosis present

## 2017-02-02 DIAGNOSIS — I252 Old myocardial infarction: Secondary | ICD-10-CM | POA: Diagnosis not present

## 2017-02-02 DIAGNOSIS — I2510 Atherosclerotic heart disease of native coronary artery without angina pectoris: Secondary | ICD-10-CM | POA: Diagnosis not present

## 2017-02-02 DIAGNOSIS — Z87891 Personal history of nicotine dependence: Secondary | ICD-10-CM

## 2017-02-02 DIAGNOSIS — R079 Chest pain, unspecified: Secondary | ICD-10-CM | POA: Diagnosis not present

## 2017-02-02 DIAGNOSIS — Z833 Family history of diabetes mellitus: Secondary | ICD-10-CM | POA: Diagnosis not present

## 2017-02-02 DIAGNOSIS — I2089 Other forms of angina pectoris: Secondary | ICD-10-CM | POA: Diagnosis present

## 2017-02-02 DIAGNOSIS — Z7984 Long term (current) use of oral hypoglycemic drugs: Secondary | ICD-10-CM | POA: Diagnosis not present

## 2017-02-02 DIAGNOSIS — Z7982 Long term (current) use of aspirin: Secondary | ICD-10-CM

## 2017-02-02 LAB — CBC
HEMATOCRIT: 44.1 % (ref 40.0–52.0)
HEMOGLOBIN: 15.2 g/dL (ref 13.0–18.0)
MCH: 32.9 pg (ref 26.0–34.0)
MCHC: 34.4 g/dL (ref 32.0–36.0)
MCV: 95.6 fL (ref 80.0–100.0)
Platelets: 215 10*3/uL (ref 150–440)
RBC: 4.62 MIL/uL (ref 4.40–5.90)
RDW: 13.8 % (ref 11.5–14.5)
WBC: 8.8 10*3/uL (ref 3.8–10.6)

## 2017-02-02 LAB — APTT: APTT: 30 s (ref 24–36)

## 2017-02-02 LAB — BASIC METABOLIC PANEL
ANION GAP: 10 (ref 5–15)
BUN: 20 mg/dL (ref 6–20)
CHLORIDE: 101 mmol/L (ref 101–111)
CO2: 27 mmol/L (ref 22–32)
CREATININE: 1.04 mg/dL (ref 0.61–1.24)
Calcium: 9.3 mg/dL (ref 8.9–10.3)
GFR calc non Af Amer: >60 mL/min (ref >60)
Glucose, Bld: 242 mg/dL — ABNORMAL HIGH (ref 65–99)
POTASSIUM: 4.3 mmol/L (ref 3.5–5.1)
SODIUM: 138 mmol/L (ref 135–145)

## 2017-02-02 LAB — PROTIME-INR
INR: 0.93
PROTHROMBIN TIME: 12.5 s (ref 11.4–15.2)

## 2017-02-02 LAB — TROPONIN I: TROPONIN I: 0.04 ng/mL — AB (ref <0.03)

## 2017-02-02 MED ORDER — HEPARIN (PORCINE) IN NACL 100-0.45 UNIT/ML-% IJ SOLN: 10.0000 [IU]/kg/h | Freq: Once | INTRAMUSCULAR | Status: DC

## 2017-02-02 MED ORDER — HEPARIN SODIUM (PORCINE) 5000 UNIT/ML IJ SOLN: 4000.0000 [IU] | Freq: Once | INTRAMUSCULAR | Status: DC

## 2017-02-02 MED ORDER — HEPARIN BOLUS VIA INFUSION
4000.0000 [IU] | Freq: Once | INTRAVENOUS | Status: AC
  Administered 2017-02-02: 4000 [IU] via INTRAVENOUS
  Filled 2017-02-02: qty 4000

## 2017-02-02 MED ORDER — HEPARIN SODIUM (PORCINE) 5000 UNIT/ML IJ SOLN
INTRAMUSCULAR | Status: AC
  Filled 2017-02-02: qty 1

## 2017-02-02 MED ORDER — ASPIRIN 81 MG PO CHEW
324.0000 mg | CHEWABLE_TABLET | Freq: Once | ORAL | Status: AC
  Administered 2017-02-02: 324 mg via ORAL
  Filled 2017-02-02: qty 4

## 2017-02-02 MED ORDER — HEPARIN (PORCINE) IN NACL 100-0.45 UNIT/ML-% IJ SOLN
1250.0000 [IU]/h | INTRAMUSCULAR | Status: DC
  Administered 2017-02-02: 1100 [IU]/h via INTRAVENOUS
  Filled 2017-02-02 (×2): qty 250

## 2017-02-02 NOTE — ED Notes (Signed)
Repeat troponin obtained. Pt updated on approximate turn around time for results of troponin repeat. Pt verbalizes understanding and denies pain at present. Pt reports "i feel fine now. i'm ready to go home."

## 2017-02-02 NOTE — ED Notes (Signed)
Critical troponin of 0.04 called from lab. Dr. Darnelle Catalanmalinda notified. Pt updated on plan of care. Pt verbalizes understanding. Spouse at bedside.

## 2017-02-02 NOTE — ED Provider Notes (Signed)
[facility] Emergency Department Provider Note   ____________________________________________   First MD Initiated Contact with Patient 02/02/17 1954     (approximate)  I have reviewed the triage vital signs and the nursing notes.   HISTORY  Chief Complaint Chest Pain    HPI Gary Gutierrez is a 66 y.o. male patient reports he's had both stents CABG and reflux and he still cannot tell the difference between them. He says he began having some chest pain at about 2:00 today. It was behind his sternum and was uncomfortable possibly a little bit burning he could not really tell. It did not radiate. It was not associated with nausea sweating or shortness of breath. It lasted till he came to the emergency room which time he drank some water had a lot of burping and it went away. Pain was moderate in intensity.   Past Medical History:  Diagnosis Date  . Allergy   . Coronary artery disease   . Diabetes mellitus without complication (HCC)   . Hyperlipidemia   . Hypertension     Patient Active Problem List   Diagnosis Date Noted  . Uncontrolled type 2 diabetes mellitus without complication, without long-term current use of insulin (HCC) 08/12/2016  . Hx of colonic polyps 04/10/2016  . Hearing loss of both ears 01/03/2016  . Acid reflux 06/09/2015  . Mixed hyperlipidemia 06/09/2015  . Essential hypertension 06/09/2015  . Controlled gout 06/09/2015  . Multilevel degenerative disc disease 06/09/2015  . Coronary artery disease involving native coronary artery of native heart with angina pectoris (HCC) 05/09/2014    Past Surgical History:  Procedure Laterality Date  . APPENDECTOMY    . colonscopy  2010   benign polyps  . CORONARY ARTERY BYPASS GRAFT  1995  . ESOPHAGOGASTRODUODENOSCOPY  2010   normal  . PERCUTANEOUS CORONARY STENT INTERVENTION (PCI-S)  2010, 2015   4 stents total    Prior to Admission medications   Medication Sig Start Date End  Date Taking? Authorizing Provider  ACCU-CHEK AVIVA PLUS test strip TEST BLOOD SUGAR DAILY AS DIRECTED 11/03/16   Reubin MilanLaura H Berglund, MD  allopurinol (ZYLOPRIM) 300 MG tablet TAKE 1 TABLET BY MOUTH EVERY DAY 01/26/17   Reubin MilanLaura H Berglund, MD  aspirin EC 81 MG tablet Take 81 mg by mouth daily. Reported on 04/10/2016    Historical Provider, MD  atorvastatin (LIPITOR) 80 MG tablet TAKE 1 TABLET BY MOUTH AT BEDTIME 04/22/16   Reubin MilanLaura H Berglund, MD  carvedilol (COREG) 25 MG tablet Take 1 tablet (25 mg total) by mouth 2 (two) times daily. 08/12/16   Reubin MilanLaura H Berglund, MD  cetirizine (ZYRTEC) 10 MG tablet Take 1 tablet by mouth daily. 11/09/15   Historical Provider, MD  esomeprazole (NEXIUM) 40 MG capsule Take 40 mg by mouth daily at 12 noon.    Historical Provider, MD  glipiZIDE (GLUCOTROL XL) 10 MG 24 hr tablet TAKE 1 TABLET BY MOUTH EVERY DAY 12/04/16   Reubin MilanLaura H Berglund, MD  KOMBIGLYZE XR 2.04-999 MG TB24 TAKE 2 TABLETS BY MOUTH DAILY 08/30/16   Reubin MilanLaura H Berglund, MD  lisinopril (PRINIVIL,ZESTRIL) 5 MG tablet TAKE 1 TABLET BY MOUTH EVERY DAY 02/02/17   Reubin MilanLaura H Berglund, MD  meloxicam (MOBIC) 15 MG tablet TAKE 1 TABLET(15 MG) BY MOUTH DAILY 01/17/17   Reubin MilanLaura H Berglund, MD    Allergies Penicillins  Family History  Problem Relation Age of Onset  . Diabetes Father   . Diabetes Brother   . Prostate  cancer Brother   . Lung cancer Brother     Social History Social History  Substance Use Topics  . Smoking status: Former Games developer  . Smokeless tobacco: Never Used  . Alcohol use No    Review of Systems Constitutional: No fever/chills Eyes: No visual changes. ENT: No sore throat. CardiovascularSee history of present illness Respiratory: Denies shortness of breath. Gastrointestinal: No abdominal pain.  No nausea, no vomiting.  No diarrhea.  No constipation. Genitourinary: Negative for dysuria. Musculoskeletal: Negative for back pain. Skin: Negative for rash. Neurological: Negative for headaches, focal weakness    10-point ROS otherwise negative.  ____________________________________________   PHYSICAL EXAM:  VITAL SIGNS: ED Triage Vitals  Enc Vitals Group     BP 02/02/17 1747 127/74     Pulse Rate 02/02/17 1747 99     Resp 02/02/17 1747 20     Temp 02/02/17 1747 97.5 F (36.4 C)     Temp Source 02/02/17 1747 Oral     SpO2 02/02/17 1747 97 %     Weight 02/02/17 1748 208 lb (94.3 kg)     Height 02/02/17 1748 5\' 10"  (1.778 m)     Head Circumference --      Peak Flow --      Pain Score 02/02/17 1748 0     Pain Loc --      Pain Edu? --      Excl. in GC? --     Constitutional: Alert and oriented. Well appearing and in no acute distress. Eyes: Conjunctivae are normal. PERRL. EOMI. Head: Atraumatic. Nose: No congestion/rhinnorhea. Mouth/Throat: Mucous membranes are moist.  Oropharynx non-erythematous. Neck: No stridor.  { Cardiovascular: Normal rate, regular rhythm. Grossly normal heart sounds.  Good peripheral circulation. Respiratory: Normal respiratory effort.  No retractions. Lungs CTAB. Gastrointestinal: Soft and nontender. No distention. No abdominal bruits. No CVA tenderness. Musculoskeletal: No lower extremity tenderness nor edema.  No joint effusions. Neurologic:  Normal speech and language. No gross focal neurologic deficits are appreciated. No gait instability. Skin:  Skin is warm, dry and intact. No rash noted.Midline scar on chest   ____________________________________________   LABS (all labs ordered are listed, but only abnormal results are displayed)  Labs Reviewed  BASIC METABOLIC PANEL - Abnormal; Notable for the following:       Result Value   Glucose, Bld 242 (*)    All other components within normal limits  TROPONIN I - Abnormal; Notable for the following:    Troponin I 0.04 (*)    All other components within normal limits  CBC  TROPONIN I  PROTIME-INR  APTT   ____________________________________________  EKG  \EKG read and interpreted by me shows  sinus tachycardia rate of 103 normal axis no acute ST-T wave changes ____________________________________________  RADIOLOGY  Study Result   CLINICAL DATA:  Chest pain  EXAM: CHEST  2 VIEW  COMPARISON:  10/10/2014  FINDINGS: Cardiac shadow is stable. Postoperative changes are noted. The lungs are well aerated bilaterally. Interstitial changes are seen bilaterally without focal infiltrate. No sizable effusion is noted. Degenerative change of the thoracic spine is noted.  IMPRESSION: Chronic changes without acute abnormality   Electronically Signed   By: Alcide Clever M.D.   On: 02/02/2017 18:40    ____________________________________________   PROCEDURES  Procedure(s) performed:  Procedures  Critical Care performed:  ____________________________________________   INITIAL IMPRESSION / ASSESSMENT AND PLAN / ED COURSE  Pertinent labs & imaging results that were available during my care of the patient  were reviewed by me and considered in my medical decision making (see chart for details).    Clinical Course as of Feb 03 2212  Wynelle Link Feb 02, 2017  1954 Hemoglobin: 15.2 [PM]  2011 DG Chest 2 View [PM]    Clinical Course User Index [PM] Arnaldo Natal, MD     ____________________________________________   FINAL CLINICAL IMPRESSION(S) / ED DIAGNOSES  Final diagnoses:  NSTEMI (non-ST elevated myocardial infarction) [facility])      NEW MEDICATIONS STARTED DURING THIS VISIT:  New Prescriptions   No medications on file     Note:  This document was prepared using Dragon voice recognition software and may include unintentional dictation errors.    Arnaldo Natal, MD 02/02/17 2213

## 2017-02-02 NOTE — ED Notes (Signed)
Pt updated on next blood draw time. Pt verbalizes understanding.

## 2017-02-02 NOTE — Progress Notes (Signed)
ANTICOAGULATION CONSULT NOTE - Initial Consult  Pharmacy Consult for heparin Indication: chest pain/ACS  Allergies  Allergen Reactions  . Penicillins Other (See Comments)    Other reaction(s): Unknown    Patient Measurements: Height: 5\' 10"  (177.8 cm) Weight: 208 lb (94.3 kg) IBW/kg (Calculated) : 73 Heparin Dosing Weight: 92.2 kg  Vital Signs: Temp: 97.5 F (36.4 C) (02/04 1747) Temp Source: Oral (02/04 1747) BP: 111/63 (02/04 2130) Pulse Rate: 81 (02/04 2130)  Labs:  Recent Labs  02/02/17 1758 02/02/17 1902 02/02/17 2126  HGB 15.2  --   --   HCT 44.1  --   --   PLT 215  --   --   CREATININE  --  1.04  --   TROPONINI  --  <0.03 0.04*    Estimated Creatinine Clearance: 80.5 mL/min (by C-G formula based on SCr of 1.04 mg/dL).   Medical History: Past Medical History:  Diagnosis Date  . Allergy   . Coronary artery disease   . Diabetes mellitus without complication (HCC)   . Hyperlipidemia   . Hypertension     Medications:  Infusions:  . heparin      Assessment: 65 yom cc CP with PMH stents and CABG, reflux. Troponin <0.03 --> 0.04. Pharmacy consulted to dose heparin for ACS. PTA does not list any OAC. Goal of Therapy:  Heparin level 0.3-0.7 units/ml Monitor platelets by anticoagulation protocol: Yes   Plan:  Give 4000 units bolus x 1 Start heparin infusion at 1100 units/hr Check anti-Xa level in 6 hours and daily while on heparin Continue to monitor H&H and platelets  Carola FrostNathan A Kalab Camps, Pharm.D., BCPS Clinical Pharmacist 02/02/2017,10:16 PM

## 2017-02-02 NOTE — ED Notes (Signed)
Dr. hugelmeyer in to see pt.  

## 2017-02-02 NOTE — H&P (Addendum)
[facility] [place] @ [facility] Admission History and Physical Tonye Royalty, D.O.  ---------------------------------------------------------------------------------------------------------------------   PATIENT NAME: Gary Gutierrez MR#: [id number] DATE OF BIRTH: Apr 16, 1951 DATE OF ADMISSION: 02/02/2017 PRIMARY CARE PHYSICIAN: Alwyn Pea, MD  REQUESTING/REFERRING PHYSICIAN: ED Dr. Darnelle Catalan  CHIEF COMPLAINT: Chief Complaint  Patient presents with  . Chest Pain    HISTORY OF PRESENT ILLNESS: Gary Gutierrez is a 66 y.o. male with a known history of Coronary artery disease status post MI, diabetes, hypertension, hyperlipidemia, GERD presents to the emergency department for evaluation of chest pain.  Patient was in a usual state of health until this afternoon when he developed midsternal chest pain described as burning, localized initially but later radiating into the left shoulder and posterior left arm. Patient states that the pain was relieved with burping and not associated with any nausea, diaphoresis, shortness of breath or palpitations. Patient does a lot of standing history of gastric esophageal reflux disease but states that this pain felt different than indigestion..  Otherwise there has been no change in status. Patient has been taking medication as prescribed and there has been no recent change in medication or diet.  There has been no recent illness, travel or sick contacts.    Patient denies fevers/chills, weakness, dizziness, shortness of breath, N/V/C/D, abdominal pain, dysuria/frequency, changes in mental status.   EMS/ED COURSE:   Patient received aspirin 324 and heparin drip.  PAST MEDICAL HISTORY: Past Medical History:  Diagnosis Date  . Allergy   . Coronary artery disease   . Diabetes mellitus without complication (HCC)   . Hyperlipidemia   . Hypertension       PAST SURGICAL HISTORY: Past Surgical History:  Procedure Laterality Date  .  APPENDECTOMY    . colonscopy  2010   benign polyps  . CORONARY ARTERY BYPASS GRAFT  1995  . ESOPHAGOGASTRODUODENOSCOPY  2010   normal  . PERCUTANEOUS CORONARY STENT INTERVENTION (PCI-S)  2010, 2015   4 stents total      SOCIAL HISTORY: Social History  Substance Use Topics  . Smoking status: Former Games developer  . Smokeless tobacco: Never Used  . Alcohol use No      FAMILY HISTORY: Family History  Problem Relation Age of Onset  . Diabetes Father   . Diabetes Brother   . Prostate cancer Brother   . Lung cancer Brother      MEDICATIONS AT HOME: Prior to Admission medications   Medication Sig Start Date End Date Taking? Authorizing Provider  ACCU-CHEK AVIVA PLUS test strip TEST BLOOD SUGAR DAILY AS DIRECTED 11/03/16  Yes [name], MD  allopurinol (ZYLOPRIM) 300 MG tablet TAKE 1 TABLET BY MOUTH EVERY DAY 01/26/17  Yes [name], MD  aspirin EC 81 MG tablet Take 81 mg by mouth daily. Reported on 04/10/2016   Yes Historical Provider, MD  atorvastatin (LIPITOR) 80 MG tablet TAKE 1 TABLET BY MOUTH AT BEDTIME 04/22/16  Yes [name], MD  carvedilol (COREG) 25 MG tablet Take 1 tablet (25 mg total) by mouth 2 (two) times daily. 08/12/16  Yes [name], MD  esomeprazole (NEXIUM) 40 MG capsule Take 40 mg by mouth daily at 12 noon.   Yes Historical Provider, MD  glipiZIDE (GLUCOTROL XL) 10 MG 24 hr tablet TAKE 1 TABLET BY MOUTH EVERY DAY 12/04/16  Yes [name], MD  KOMBIGLYZE XR 2.04-999 MG TB24 TAKE 2 TABLETS BY MOUTH DAILY 08/30/16  Yes [name], MD  lisinopril (PRINIVIL,ZESTRIL)  5 MG tablet TAKE 1 TABLET BY MOUTH EVERY DAY 02/02/17  Yes [name], MD  meloxicam (MOBIC) 15 MG tablet TAKE 1 TABLET(15 MG) BY MOUTH DAILY 01/17/17  Yes [name], MD  cetirizine (ZYRTEC) 10 MG tablet Take 1 tablet by mouth daily. 11/09/15   Historical Provider, MD      DRUG ALLERGIES: Allergies  Allergen Reactions  . Penicillins Other (See Comments)    Other  reaction(s): Unknown, pt does not know reactions     REVIEW OF SYSTEMS: CONSTITUTIONAL: No fatigue, weakness, fever, chills, weight gain/loss, headache EYES: No blurry or double vision. ENT: No tinnitus, postnasal drip, redness or soreness of the oropharynx. RESPIRATORY: No dyspnea, cough, wheeze, hemoptysis. CARDIOVASCULAR: Positive chest pain, negative orthopnea, palpitations, syncope. GASTROINTESTINAL: No nausea, vomiting, constipation, diarrhea, abdominal pain. No hematemesis, melena or hematochezia. GENITOURINARY: No dysuria, frequency, hematuria. ENDOCRINE: No polyuria or nocturia. No heat or cold intolerance. HEMATOLOGY: No anemia, bruising, bleeding. INTEGUMENTARY: No rashes, ulcers, lesions. MUSCULOSKELETAL: No pain, arthritis, swelling, gout. NEUROLOGIC: No numbness, tingling, weakness or ataxia. No seizure-type activity. PSYCHIATRIC: No anxiety, depression, insomnia.  PHYSICAL EXAMINATION: VITAL SIGNS: Blood pressure (!) 141/82, pulse 84, temperature 97.5 F (36.4 C), temperature source Oral, resp. rate 17, height 5\' 10"  (1.778 m), weight 94.3 kg (208 lb), SpO2 99 %.  GENERAL: 66 y.o.-year-old white male patient, well-developed, well-nourished lying in the bed in no acute distress.  Pleasant and cooperative.   HEENT: Head atraumatic, normocephalic. Pupils equal, round, reactive to light and accommodation. No scleral icterus. Extraocular muscles intact. Oropharynx is clear. Mucus membranes moist. NECK: Supple, full range of motion. No JVD, no bruit heard. No cervical lymphadenopathy. CHEST: Normal breath sounds bilaterally. No wheezing, rales, rhonchi or crackles. No use of accessory muscles of respiration.  No reproducible chest wall tenderness.  CARDIOVASCULAR: S1, S2 normal. No murmurs, rubs, or gallops appreciated. Cap refill <2 seconds. ABDOMEN: Soft, nontender, nondistended. No rebound, guarding, rigidity. Normoactive bowel sounds present in all four quadrants. No  organomegaly or mass. EXTREMITIES: Full range of motion. No pedal edema, cyanosis, or clubbing. NEUROLOGIC: Cranial nerves II through XII are grossly intact with no focal sensorimotor deficit. Muscle strength 5/5 in all extremities. Sensation intact. Gait not checked. PSYCHIATRIC: The patient is alert and oriented x 3. Normal affect, mood, thought content. SKIN: Warm, dry, and intact without obvious rash, lesion, or ulcer.  LABORATORY PANEL:  CBC  Recent Labs Lab 02/02/17 1758  WBC 8.8  HGB 15.2  HCT 44.1  PLT 215   ----------------------------------------------------------------------------------------------------------------- Chemistries  Recent Labs Lab 02/02/17 1902  NA 138  K 4.3  CL 101  CO2 27  GLUCOSE 242*  BUN 20  CREATININE 1.04  CALCIUM 9.3   ------------------------------------------------------------------------------------------------------------------ Cardiac Enzymes  Recent Labs Lab 02/02/17 2126  TROPONINI 0.04*   ------------------------------------------------------------------------------------------------------------------  RADIOLOGY: Dg Chest 2 View  Result Date: 02/02/2017 CLINICAL DATA:  Chest pain EXAM: CHEST  2 VIEW COMPARISON:  10/10/2014 FINDINGS: Cardiac shadow is stable. Postoperative changes are noted. The lungs are well aerated bilaterally. Interstitial changes are seen bilaterally without focal infiltrate. No sizable effusion is noted. Degenerative change of the thoracic spine is noted. IMPRESSION: Chronic changes without acute abnormality Electronically Signed   By: Alcide Clever M.D.   On: 02/02/2017 18:40    EKG: Sinus tachycardia at 103 bpm with normal axis and nonspecific ST-T wave changes.   IMPRESSION AND PLAN:  This is a 66 y.o. male with a history of Coronary artery disease status post MI, diabetes,  hypertension, hyperlipidemia, GERD  now being admitted with:  1. Chest pain, rule out ACS.  - Admit to observation with  telemetry monitoring. - Trend troponins, check lipids and TSH. - Morphine, nitro ordered -Continue aspirin, Lipitor, Coreg, lisinopril - Check echo - Cardiology consult requested.   2. History of GERD-continue Protonix, consider GI workup as outpatient if patient rules out.  3. History of diabetes-cover with regular insulin sliding scale. Hold glipizide and Kombiglyze XR 4. History of gout-continue Zyloprim   Diet/Nutrition: Heart healthy, carb controlled. nothing by mouth after midnight Fluids: HL Consults: Cardiology DVT Px: Heparin SCDs and early ambulation Code Status: Full  All the records are reviewed and case discussed with ED provider. Management plans discussed with the patient and/or family who express understanding and agree with plan of care.   TOTAL TIME TAKING CARE OF THIS PATIENT: 60 minutes.   Sally-Ann Cutbirth D.O. on 02/02/2017 at 11:37 PM Between 7am to 6pm - Pager - [phone] After 6pm go to [email or web address] - Social research officer, governmentpassword EPAS [facility] [place] [facility] Office [phone] CC: Primary care physician; Alwyn Peawayne D Callwood, MD     Note: This dictation was prepared with Dragon dictation along with smaller phrase technology. Any transcriptional errors that result from this process are unintentional.

## 2017-02-02 NOTE — ED Triage Notes (Signed)
Pt reports 1 week of cough and congestion sxs c/o indigestion frequently was on medications but was stopped.  Pt has hx of MI with stent placement.  Pt reports sharp substernal chest pain. Has taken 4 NTG since 2pm, last dose was on his way to ED. Pt reports he sees Dr. Juliann Paresallwood.  NTG did relieve pain briefly, but did return.  Some shortness of breath and left arm pain as well. Denies any pain currently but reports when the pain comes he it is an 8/10.

## 2017-02-03 ENCOUNTER — Encounter
Admission: EM | Disposition: A | Discharge status: Home / Self Care | Payer: Self-pay | Source: Home / Self Care | Attending: Internal Medicine

## 2017-02-03 DIAGNOSIS — I10 Essential (primary) hypertension: Secondary | ICD-10-CM | POA: Diagnosis not present

## 2017-02-03 DIAGNOSIS — R079 Chest pain, unspecified: Secondary | ICD-10-CM | POA: Diagnosis not present

## 2017-02-03 DIAGNOSIS — I208 Other forms of angina pectoris: Secondary | ICD-10-CM | POA: Diagnosis present

## 2017-02-03 DIAGNOSIS — E785 Hyperlipidemia, unspecified: Secondary | ICD-10-CM | POA: Diagnosis not present

## 2017-02-03 DIAGNOSIS — I214 Non-ST elevation (NSTEMI) myocardial infarction: Secondary | ICD-10-CM | POA: Diagnosis not present

## 2017-02-03 DIAGNOSIS — E119 Type 2 diabetes mellitus without complications: Secondary | ICD-10-CM | POA: Diagnosis not present

## 2017-02-03 DIAGNOSIS — I25119 Atherosclerotic heart disease of native coronary artery with unspecified angina pectoris: Secondary | ICD-10-CM | POA: Diagnosis not present

## 2017-02-03 DIAGNOSIS — K219 Gastro-esophageal reflux disease without esophagitis: Secondary | ICD-10-CM | POA: Diagnosis not present

## 2017-02-03 DIAGNOSIS — I2510 Atherosclerotic heart disease of native coronary artery without angina pectoris: Secondary | ICD-10-CM | POA: Diagnosis not present

## 2017-02-03 HISTORY — PX: CORONARY STENT INTERVENTION: CATH118234

## 2017-02-03 HISTORY — PX: LEFT HEART CATH AND CORONARY ANGIOGRAPHY: CATH118249

## 2017-02-03 LAB — GLUCOSE, CAPILLARY
GLUCOSE-CAPILLARY: 207 mg/dL — AB (ref 65–99)
Glucose-Capillary: 285 mg/dL — ABNORMAL HIGH (ref 65–99)

## 2017-02-03 LAB — POCT ACTIVATED CLOTTING TIME: Activated Clotting Time: 400 seconds

## 2017-02-03 LAB — MRSA PCR SCREENING: MRSA by PCR: NEGATIVE

## 2017-02-03 LAB — CARDIAC CATHETERIZATION: Cath EF Quantitative: 35 %

## 2017-02-03 LAB — TROPONIN I
Troponin I: 0.09 ng/mL (ref <0.03)
Troponin I: 0.14 ng/mL (ref <0.03)
Troponin I: 0.19 ng/mL (ref <0.03)

## 2017-02-03 LAB — HEPARIN LEVEL (UNFRACTIONATED): HEPARIN UNFRACTIONATED: 0.22 [IU]/mL — AB (ref 0.30–0.70)

## 2017-02-03 SURGERY — LEFT HEART CATH AND CORONARY ANGIOGRAPHY
Anesthesia: Moderate Sedation

## 2017-02-03 MED ORDER — SODIUM CHLORIDE 0.9 % IV SOLN
0.2500 mg/kg/h | INTRAVENOUS | Status: AC
  Filled 2017-02-03: qty 250

## 2017-02-03 MED ORDER — ASPIRIN 81 MG PO CHEW: 81.0000 mg | CHEWABLE_TABLET | ORAL | Status: DC

## 2017-02-03 MED ORDER — FENTANYL CITRATE (PF) 100 MCG/2ML IJ SOLN
INTRAMUSCULAR | Status: DC | PRN
  Administered 2017-02-03: 12.5 ug via INTRAVENOUS

## 2017-02-03 MED ORDER — ASPIRIN 81 MG PO CHEW
CHEWABLE_TABLET | ORAL | Status: DC | PRN
Start: 2017-02-03 — End: 2017-02-03
  Administered 2017-02-03: 243 mg via ORAL

## 2017-02-03 MED ORDER — MIDAZOLAM HCL 2 MG/2ML IJ SOLN
INTRAMUSCULAR | Status: AC
  Filled 2017-02-03: qty 2

## 2017-02-03 MED ORDER — BIVALIRUDIN 250 MG IV SOLR
INTRAVENOUS | Status: AC
  Filled 2017-02-03: qty 250

## 2017-02-03 MED ORDER — MIDAZOLAM HCL 2 MG/2ML IJ SOLN
INTRAMUSCULAR | Status: DC | PRN
  Administered 2017-02-03: 1 mg via INTRAVENOUS

## 2017-02-03 MED ORDER — FENTANYL CITRATE (PF) 100 MCG/2ML IJ SOLN
INTRAMUSCULAR | Status: AC
  Filled 2017-02-03: qty 2

## 2017-02-03 MED ORDER — SODIUM CHLORIDE 0.9 % IV SOLN: 250.0000 mL | INTRAVENOUS | Status: DC | PRN

## 2017-02-03 MED ORDER — INSULIN ASPART 100 UNIT/ML ~~LOC~~ SOLN
0.0000 [IU] | Freq: Three times a day (TID) | SUBCUTANEOUS | Status: DC
  Administered 2017-02-03: 3 [IU] via SUBCUTANEOUS
  Administered 2017-02-04: 2 [IU] via SUBCUTANEOUS
  Administered 2017-02-04: 5 [IU] via SUBCUTANEOUS
  Filled 2017-02-03: qty 3
  Filled 2017-02-03: qty 2
  Filled 2017-02-03: qty 3
  Filled 2017-02-03: qty 2

## 2017-02-03 MED ORDER — ATORVASTATIN CALCIUM 20 MG PO TABS
80.0000 mg | ORAL_TABLET | Freq: Every day | ORAL | Status: DC
  Administered 2017-02-03 (×2): 80 mg via ORAL
  Filled 2017-02-03 (×2): qty 4

## 2017-02-03 MED ORDER — MORPHINE SULFATE (PF) 4 MG/ML IV SOLN: 2.0000 mg | INTRAVENOUS | Status: DC | PRN

## 2017-02-03 MED ORDER — SODIUM CHLORIDE 0.9 % WEIGHT BASED INFUSION: 1.0000 mL/kg/h | INTRAVENOUS | Status: AC

## 2017-02-03 MED ORDER — SIMETHICONE 80 MG PO CHEW
80.0000 mg | CHEWABLE_TABLET | Freq: Four times a day (QID) | ORAL | Status: DC
  Administered 2017-02-03 – 2017-02-04 (×4): 80 mg via ORAL
  Filled 2017-02-03 (×5): qty 1

## 2017-02-03 MED ORDER — HEPARIN BOLUS VIA INFUSION
1400.0000 [IU] | Freq: Once | INTRAVENOUS | Status: AC
  Administered 2017-02-03: 1400 [IU] via INTRAVENOUS
  Filled 2017-02-03: qty 1400

## 2017-02-03 MED ORDER — SODIUM CHLORIDE 0.9 % IV SOLN
INTRAVENOUS | Status: DC | PRN
  Administered 2017-02-03: 1.75 mg/kg/h via INTRAVENOUS

## 2017-02-03 MED ORDER — IOPAMIDOL (ISOVUE-300) INJECTION 61%
INTRAVENOUS | Status: DC | PRN
  Administered 2017-02-03: 235 mL via INTRA_ARTERIAL

## 2017-02-03 MED ORDER — ALLOPURINOL 100 MG PO TABS
300.0000 mg | ORAL_TABLET | Freq: Every day | ORAL | Status: DC
  Administered 2017-02-03 – 2017-02-04 (×2): 300 mg via ORAL
  Filled 2017-02-03: qty 1
  Filled 2017-02-03: qty 3

## 2017-02-03 MED ORDER — SODIUM CHLORIDE 0.9 % WEIGHT BASED INFUSION: 3.0000 mL/kg/h | INTRAVENOUS | Status: DC

## 2017-02-03 MED ORDER — ASPIRIN 81 MG PO CHEW
CHEWABLE_TABLET | ORAL | Status: AC
  Filled 2017-02-03: qty 1

## 2017-02-03 MED ORDER — ACETAMINOPHEN 325 MG PO TABS: 650.0000 mg | ORAL_TABLET | ORAL | Status: DC | PRN

## 2017-02-03 MED ORDER — PANTOPRAZOLE SODIUM 40 MG PO TBEC
40.0000 mg | DELAYED_RELEASE_TABLET | Freq: Every day | ORAL | Status: DC
  Administered 2017-02-03 – 2017-02-04 (×2): 40 mg via ORAL
  Filled 2017-02-03 (×2): qty 1

## 2017-02-03 MED ORDER — GI COCKTAIL ~~LOC~~
30.0000 mL | Freq: Four times a day (QID) | ORAL | Status: DC | PRN
  Filled 2017-02-03: qty 30

## 2017-02-03 MED ORDER — SODIUM CHLORIDE 0.9 % IV SOLN
INTRAVENOUS | Status: DC
  Administered 2017-02-03 – 2017-02-04 (×3): via INTRAVENOUS

## 2017-02-03 MED ORDER — NITROGLYCERIN 1 MG/10 ML FOR IR/CATH LAB
INTRA_ARTERIAL | Status: DC | PRN
Start: 2017-02-03 — End: 2017-02-03
  Administered 2017-02-03: 200 ug via INTRACORONARY

## 2017-02-03 MED ORDER — TICAGRELOR 90 MG PO TABS
90.0000 mg | ORAL_TABLET | Freq: Two times a day (BID) | ORAL | Status: DC
  Administered 2017-02-03 – 2017-02-04 (×2): 90 mg via ORAL
  Filled 2017-02-03 (×2): qty 1

## 2017-02-03 MED ORDER — ASPIRIN 81 MG PO CHEW
CHEWABLE_TABLET | ORAL | Status: AC
  Filled 2017-02-03: qty 2

## 2017-02-03 MED ORDER — ONDANSETRON HCL 4 MG/2ML IJ SOLN: 4.0000 mg | Freq: Four times a day (QID) | INTRAMUSCULAR | Status: DC | PRN

## 2017-02-03 MED ORDER — HEPARIN (PORCINE) IN NACL 2-0.9 UNIT/ML-% IJ SOLN
INTRAMUSCULAR | Status: AC
  Filled 2017-02-03: qty 500

## 2017-02-03 MED ORDER — SODIUM CHLORIDE 0.9% FLUSH
3.0000 mL | Freq: Two times a day (BID) | INTRAVENOUS | Status: DC
  Administered 2017-02-04: 3 mL via INTRAVENOUS

## 2017-02-03 MED ORDER — HYDRALAZINE HCL 20 MG/ML IJ SOLN: 5.0000 mg | INTRAMUSCULAR | Status: AC | PRN

## 2017-02-03 MED ORDER — LISINOPRIL 5 MG PO TABS
5.0000 mg | ORAL_TABLET | Freq: Every day | ORAL | Status: DC
  Administered 2017-02-03 – 2017-02-04 (×2): 5 mg via ORAL
  Filled 2017-02-03 (×2): qty 1

## 2017-02-03 MED ORDER — TICAGRELOR 90 MG PO TABS
ORAL_TABLET | ORAL | Status: DC | PRN
  Administered 2017-02-03: 180 mg via ORAL

## 2017-02-03 MED ORDER — LORATADINE 10 MG PO TABS
10.0000 mg | ORAL_TABLET | Freq: Every day | ORAL | Status: DC
  Administered 2017-02-03 – 2017-02-04 (×2): 10 mg via ORAL
  Filled 2017-02-03 (×2): qty 1

## 2017-02-03 MED ORDER — BIVALIRUDIN BOLUS VIA INFUSION - CUPID
INTRAVENOUS | Status: DC | PRN
Start: 2017-02-03 — End: 2017-02-03
  Administered 2017-02-03: 70.725 mg via INTRAVENOUS

## 2017-02-03 MED ORDER — ASPIRIN EC 81 MG PO TBEC
81.0000 mg | DELAYED_RELEASE_TABLET | Freq: Every day | ORAL | Status: DC
  Administered 2017-02-03 – 2017-02-04 (×2): 81 mg via ORAL
  Filled 2017-02-03 (×2): qty 1

## 2017-02-03 MED ORDER — NITROGLYCERIN 5 MG/ML IV SOLN
INTRAVENOUS | Status: AC
Start: 2017-02-03 — End: 2017-02-03
  Filled 2017-02-03: qty 10

## 2017-02-03 MED ORDER — ZOLPIDEM TARTRATE 5 MG PO TABS: 5.0000 mg | ORAL_TABLET | Freq: Every evening | ORAL | Status: DC | PRN

## 2017-02-03 MED ORDER — SODIUM CHLORIDE 0.9% FLUSH: 3.0000 mL | INTRAVENOUS | Status: DC | PRN

## 2017-02-03 MED ORDER — SODIUM CHLORIDE 0.9 % WEIGHT BASED INFUSION: 1.0000 mL/kg/h | INTRAVENOUS | Status: DC

## 2017-02-03 MED ORDER — ALPRAZOLAM 0.25 MG PO TABS: 0.2500 mg | ORAL_TABLET | Freq: Two times a day (BID) | ORAL | Status: DC | PRN

## 2017-02-03 MED ORDER — INSULIN ASPART 100 UNIT/ML ~~LOC~~ SOLN
0.0000 [IU] | Freq: Every day | SUBCUTANEOUS | Status: DC
  Administered 2017-02-03: 3 [IU] via SUBCUTANEOUS
  Filled 2017-02-03: qty 3

## 2017-02-03 MED ORDER — LABETALOL HCL 5 MG/ML IV SOLN: 10.0000 mg | INTRAVENOUS | Status: AC | PRN

## 2017-02-03 MED ORDER — ASPIRIN 81 MG PO CHEW: 81.0000 mg | CHEWABLE_TABLET | Freq: Every day | ORAL | Status: DC

## 2017-02-03 MED ORDER — TICAGRELOR 90 MG PO TABS
ORAL_TABLET | ORAL | Status: AC
  Filled 2017-02-03: qty 2

## 2017-02-03 MED ORDER — CARVEDILOL 6.25 MG PO TABS
25.0000 mg | ORAL_TABLET | Freq: Two times a day (BID) | ORAL | Status: DC
  Administered 2017-02-03 – 2017-02-04 (×3): 25 mg via ORAL
  Filled 2017-02-03: qty 1
  Filled 2017-02-03: qty 4
  Filled 2017-02-03: qty 1

## 2017-02-03 SURGICAL SUPPLY — 18 items
BALLN TREK RX 2.5X15 (BALLOONS) ×3
BALLOON TREK RX 2.5X15 (BALLOONS) ×1 IMPLANT
CATH 5FR JL4 DIAGNOSTIC (CATHETERS) ×3 IMPLANT
CATH 5FR JR4 DIAGNOSTIC (CATHETERS) ×2 IMPLANT
CATH INFINITI 5FR ANG PIGTAIL (CATHETERS) ×3 IMPLANT
CATH VISTA GUIDE 6FR JR4 SH (CATHETERS) ×3 IMPLANT
DEVICE INFLAT 30 PLUS (MISCELLANEOUS) ×3 IMPLANT
FEM STOP ARCH (HEMOSTASIS) ×2
KIT MANI 3VAL PERCEP (MISCELLANEOUS) ×3 IMPLANT
NEEDLE PERC 18GX7CM (NEEDLE) ×3 IMPLANT
PACK CARDIAC CATH (CUSTOM PROCEDURE TRAY) ×3 IMPLANT
SHEATH AVANTI 5FR X 11CM (SHEATH) ×3 IMPLANT
SHEATH AVANTI 6FR X 11CM (SHEATH) ×3 IMPLANT
STENT XIENCE ALPINE RX 3.0X18 (Permanent Stent) ×3 IMPLANT
STENT XIENCE ALPINE RX 3.0X8 (Permanent Stent) ×3 IMPLANT
SYSTEM COMPRESSION FEMOSTOP (HEMOSTASIS) ×1 IMPLANT
WIRE EMERALD 3MM-J .035X150CM (WIRE) ×3 IMPLANT
WIRE G HI TQ BMW 190 (WIRE) ×3 IMPLANT

## 2017-02-03 NOTE — OR Nursing (Signed)
Call to dr Juliann Parescallwood, clarification of orders. Since on angiomax need to keep FEMSTOP inflated until 18:00 tonight. Plan to ccu due to complications of hemostatsis and need for Femstop (not able to monitor on telemetry unit.

## 2017-02-03 NOTE — Progress Notes (Signed)
ANTICOAGULATION CONSULT NOTE - Initial Consult  Pharmacy Consult for heparin Indication: chest pain/ACS  Allergies  Allergen Reactions  . Penicillins Other (See Comments)    Other reaction(s): Unknown, pt does not know reactions    Patient Measurements: Height: 5\' 10"  (177.8 cm) Weight: 208 lb (94.3 kg) IBW/kg (Calculated) : 73 Heparin Dosing Weight: 92.2 kg  Vital Signs: Temp: 97.6 F (36.4 C) (02/05 0504) Temp Source: Oral (02/05 0504) BP: 110/70 (02/05 0504) Pulse Rate: 83 (02/05 0504)  Labs:  Recent Labs  02/02/17 1758  02/02/17 1902 02/02/17 2126 02/02/17 2220 02/03/17 0112 02/03/17 0451  HGB 15.2  --   --   --   --   --   --   HCT 44.1  --   --   --   --   --   --   PLT 215  --   --   --   --   --   --   APTT  --   --   --   --  30  --   --   LABPROT  --   --   --   --  12.5  --   --   INR  --   --   --   --  0.93  --   --   HEPARINUNFRC  --   --   --   --   --   --  0.22*  CREATININE  --   --  1.04  --   --   --   --   TROPONINI  --   < > <0.03 0.04*  --  0.09* 0.14*  < > = values in this interval not displayed.  Estimated Creatinine Clearance: 80.5 mL/min (by C-G formula based on SCr of 1.04 mg/dL).   Medical History: Past Medical History:  Diagnosis Date  . Allergy   . Coronary artery disease   . Diabetes mellitus without complication (HCC)   . Hyperlipidemia   . Hypertension     Medications:  Infusions:  . sodium chloride 75 mL/hr at 02/03/17 0111  . heparin 1,100 Units/hr (02/02/17 2227)    Assessment: 65 yom cc CP with PMH stents and CABG, reflux. Troponin <0.03 --> 0.04. Pharmacy consulted to dose heparin for ACS. PTA does not list any OAC. Goal of Therapy:  Heparin level 0.3-0.7 units/ml Monitor platelets by anticoagulation protocol: Yes   Plan:  Give 4000 units bolus x 1 Start heparin infusion at 1100 units/hr Check anti-Xa level in 6 hours and daily while on heparin Continue to monitor H&H and platelets  2/5 0451 HL  subtherapeutic. 1400 units IV x 1 bolus and increase rate to 1250 units/hr. Will recheck HL in 6 hours.  Carola FrostNathan A Lilyauna Miedema, Pharm.D., BCPS Clinical Pharmacist 02/03/2017,6:36 AM

## 2017-02-03 NOTE — Progress Notes (Signed)
[facility] - [place] at [facility]   PATIENT NAME: Gary Gutierrez    MR#:  [id number]  DATE OF BIRTH:  09-May-1951  SUBJECTIVE:  She is admitted for chest pain, non-ST elevation MI.Marland Kitchen And complains of lot of belching, reflux symptoms, no chest pain at this time. Has history of CAD, CABG, 2 stents in the heart. Follows up with Dr. Juliann Gutierrez.   CHIEF COMPLAINT:   Chief Complaint  Patient presents with  . Chest Pain    REVIEW OF SYSTEMS:   ROS CONSTITUTIONAL: No fever, fatigue or weakness.  EYES: No blurred or double vision.  EARS, NOSE, AND THROAT: No tinnitus or ear pain.  RESPIRATORY: No cough, shortness of breath, wheezing or hemoptysis.  CARDIOVASCULAR: No chest pain, orthopnea, edema.  GASTROINTESTINAL: No nausea, vomiting, diarrhea or abdominal pain.  GENITOURINARY: No dysuria, hematuria.  ENDOCRINE: No polyuria, nocturia,  HEMATOLOGY: No anemia, easy bruising or bleeding SKIN: No rash or lesion. MUSCULOSKELETAL: No joint pain or arthritis.   NEUROLOGIC: No tingling, numbness, weakness.  PSYCHIATRY: No anxiety or depression.   DRUG ALLERGIES:   Allergies  Allergen Reactions  . Penicillins Other (See Comments)    Other reaction(s): Unknown, pt does not know reactions    VITALS:  Blood pressure 139/88, pulse 89, temperature 98 F (36.7 C), resp. rate 17, height 5\' 10"  (1.778 m), weight 94.3 kg (208 lb), SpO2 100 %.  PHYSICAL EXAMINATION:  GENERAL:  66 y.o.-year-old patient lying in the bed with no acute distress.  EYES: Pupils equal, round, reactive to light and accommodation. No scleral icterus. Extraocular muscles intact.  HEENT: Head atraumatic, normocephalic. Oropharynx and nasopharynx clear.  NECK:  Supple, no jugular venous distention. No thyroid enlargement, no tenderness.  LUNGS: Normal breath sounds bilaterally, no wheezing, rales,rhonchi or crepitation. No use of accessory muscles of respiration.  CARDIOVASCULAR: S1, S2 normal. No  murmurs, rubs, or gallops.  ABDOMEN: Soft, nontender, nondistended. Bowel sounds present. No organomegaly or mass.  EXTREMITIES: No pedal edema, cyanosis, or clubbing.  NEUROLOGIC: Cranial nerves II through XII are intact. Muscle strength 5/5 in all extremities. Sensation intact. Gait not checked.  PSYCHIATRIC: The patient is alert and oriented x 3.  SKIN: No obvious rash, lesion, or ulcer.    LABORATORY PANEL:   CBC  Recent Labs Lab 02/02/17 1758  WBC 8.8  HGB 15.2  HCT 44.1  PLT 215   ------------------------------------------------------------------------------------------------------------------  Chemistries   Recent Labs Lab 02/02/17 1902  NA 138  K 4.3  CL 101  CO2 27  GLUCOSE 242*  BUN 20  CREATININE 1.04  CALCIUM 9.3   ------------------------------------------------------------------------------------------------------------------  Cardiac Enzymes  Recent Labs Lab 02/03/17 0723  TROPONINI 0.19*   ------------------------------------------------------------------------------------------------------------------  RADIOLOGY:  Dg Chest 2 View  Result Date: 02/02/2017 CLINICAL DATA:  Chest pain EXAM: CHEST  2 VIEW COMPARISON:  10/10/2014 FINDINGS: Cardiac shadow is stable. Postoperative changes are noted. The lungs are well aerated bilaterally. Interstitial changes are seen bilaterally without focal infiltrate. No sizable effusion is noted. Degenerative change of the thoracic spine is noted. IMPRESSION: Chronic changes without acute abnormality Electronically Signed   By: Gary Gutierrez M.D.   On: 02/02/2017 18:40    EKG:   Orders placed or performed during the hospital encounter of 02/02/17  . EKG 12-Lead  . EKG 12-Lead  . ED EKG within 10 minutes  . ED EKG within 10 minutes  . EKG 12-Lead (at 6am)  . EKG 12-Lead (Repeat cardiac markers, recurrent chest pain)  .  EKG 12-Lead (at 6am)  . EKG 12-Lead (Repeat cardiac markers, recurrent chest pain)     ASSESSMENT AND PLAN:   66 year old male patient with chest pain, non-ST elevation MI. Troponin elevated up to;0.19. Has history of significant coronary artery disease, diabetes mellitus type 2: Patient is right now taken for cardiac catheter by cardiology. 2. Diabetes mellitus type 2; glipizide, kombiglyze  #3 hyperlipidemia #4 GERD patient says that he is to take Nexium, number is having a lot of heartburn symptoms: And patient also has a lot of belching today. Add PPIs, Simethicone.. D/w wife All the records are reviewed and case discussed with Care Management/Social Workerr. Management plans discussed with the patient, family and they are in agreement.  CODE STATUS: full  TOTAL TIME TAKING CARE OF THIS PATIENT: 35 minutes.   POSSIBLE D/C IN 1-2 DAYS, DEPENDING ON CLINICAL CONDITION.   Gary Gutierrez,Gary Gutierrez M.D on 02/03/2017 at 1:19 PM  Between 7am to 6pm - Pager - [phone]  After 6pm go to [email or web address] - password [email or web address]  [facility]  Office  [phone]  CC: Primary care physician; Gary Peawayne D Callwood, MD   Note: This dictation was prepared with Dragon dictation along with smaller phrase technology. Any transcriptional errors that result from this process are unintentional.

## 2017-02-03 NOTE — Consult Note (Signed)
[facility] CARDIOLOGY [facility]  CARDIOLOGY CONSULT NOTE  Patient ID: Gary Gutierrez MRN: [id number] DOB/AGE: 02-19-1951 66 y.o.  Admit date: 02/02/2017 Referring Physician [facility] Primary Physician   Primary Cardiologist Dr. Juliann Pares Reason for Consultation nstemi  HPI: 66 year old gentleman with a past medical history remarkable for prior CABG 1995 (LIMA-LAD, SVG-DIAG, SVG-RCA), s/p PCI in 2009 (Taxus stent placement to SVG-RCA graft, He was sent to the cath lab for a LHC on 04/18/14 with subsequent two-DES placements to SVG to diagonal and native to diagonal. He was admitted after several days of progressive chest pain. Initial troponin was 0.04 and increased to 0.19. EKG revealed nsr with non specific st t wave changes. He is currently pain free with iv heparin, lisinopril, asa, carvedilol, atorvastatin at 80 mg daily.      Review of Systems  Constitutional: Negative.   HENT: Negative.   Eyes: Negative.   Respiratory: Negative.   Cardiovascular: Positive for chest pain.  Gastrointestinal: Negative.   Genitourinary: Negative.   Musculoskeletal: Negative.   Skin: Negative.   Neurological: Negative.   Endo/Heme/Allergies: Negative.   Psychiatric/Behavioral: Negative.     Past Medical History:  Diagnosis Date  . Allergy   . Coronary artery disease   . Diabetes mellitus without complication (HCC)   . Hyperlipidemia   . Hypertension     Family History  Problem Relation Age of Onset  . Diabetes Father   . Diabetes Brother   . Prostate cancer Brother   . Lung cancer Brother     Social History   Social History  . Marital status: Married    Spouse name: N/A  . Number of children: N/A  . Years of education: N/A   Occupational History  . Not on file.   Social History Main Topics  . Smoking status: Former Games developer  . Smokeless tobacco: Never Used  . Alcohol use No  . Drug use: Unknown  . Sexual activity: Not on file   Other Topics Concern  .  Not on file   Social History Narrative  . No narrative on file    Past Surgical History:  Procedure Laterality Date  . APPENDECTOMY    . colonscopy  2010   benign polyps  . CORONARY ARTERY BYPASS GRAFT  1995  . ESOPHAGOGASTRODUODENOSCOPY  2010   normal  . PERCUTANEOUS CORONARY STENT INTERVENTION (PCI-S)  2010, 2015   4 stents total     Prescriptions Prior to Admission  Medication Sig Dispense Refill Last Dose  . ACCU-CHEK AVIVA PLUS test strip TEST BLOOD SUGAR DAILY AS DIRECTED 100 each 5 02/02/2017 at Unknown time  . allopurinol (ZYLOPRIM) 300 MG tablet TAKE 1 TABLET BY MOUTH EVERY DAY 30 tablet 0 02/02/2017 at Unknown time  . aspirin EC 81 MG tablet Take 81 mg by mouth daily. Reported on 04/10/2016   02/02/2017 at Unknown time  . atorvastatin (LIPITOR) 80 MG tablet TAKE 1 TABLET BY MOUTH AT BEDTIME 30 tablet 12 02/02/2017 at Unknown time  . carvedilol (COREG) 25 MG tablet Take 1 tablet (25 mg total) by mouth 2 (two) times daily. 180 tablet 3 02/02/2017 at Unknown time  . esomeprazole (NEXIUM) 40 MG capsule Take 40 mg by mouth daily at 12 noon.   02/02/2017 at Unknown time  . glipiZIDE (GLUCOTROL XL) 10 MG 24 hr tablet TAKE 1 TABLET BY MOUTH EVERY DAY 90 tablet 2 02/02/2017 at Unknown time  . KOMBIGLYZE XR 2.04-999 MG TB24 TAKE 2 TABLETS BY MOUTH  DAILY 60 tablet 5 02/02/2017 at Unknown time  . lisinopril (PRINIVIL,ZESTRIL) 5 MG tablet TAKE 1 TABLET BY MOUTH EVERY DAY 30 tablet 0 02/02/2017 at Unknown time  . meloxicam (MOBIC) 15 MG tablet TAKE 1 TABLET(15 MG) BY MOUTH DAILY 30 tablet 0 Past Month at Unknown time  . cetirizine (ZYRTEC) 10 MG tablet Take 1 tablet by mouth daily.  5 Not Taking at Unknown time    Physical Exam: Blood pressure 139/88, pulse 89, temperature 98 F (36.7 C), resp. rate 17, height 5\' 10"  (1.778 m), weight 208 lb (94.3 kg), SpO2 100 %.   Wt Readings from Last 1 Encounters:  02/02/17 208 lb (94.3 kg)     General appearance: alert and cooperative Resp: clear to  auscultation bilaterally Cardio: regular rate and rhythm GI: soft, non-tender; bowel sounds normal; no masses,  no organomegaly Extremities: extremities normal, atraumatic, no cyanosis or edema Pulses: 2+ and symmetric Neurologic: Grossly normal  Labs:   Lab Results  Component Value Date   WBC 8.8 02/02/2017   HGB 15.2 02/02/2017   HCT 44.1 02/02/2017   MCV 95.6 02/02/2017   PLT 215 02/02/2017    Recent Labs Lab 02/02/17 1902  NA 138  K 4.3  CL 101  CO2 27  BUN 20  CREATININE 1.04  CALCIUM 9.3  GLUCOSE 242*   Lab Results  Component Value Date   CKMB 8.7 (H) 10/10/2014   TROPONINI 0.19 (HH) 02/03/2017      Radiology: Chronic changes with no aute abnormality EKG: nsr with no ischemia  ASSESSMENT AND PLAN:  Pt with history of cad s/p cabg with a lima to the lad, svg to rca and diagonal with pci to svg to rca and the svg to diagonal and the native diagonal subsequintly with DES. Now admitted with chest pain and ruled in for nstemi. No further pain at rest. Will need left heart cath to evaluate anatomy based on elevated serum troponin c/w nstemi, pain similar to angina. Further recs after cath.  Signed: Dalia HeadingKenneth A Elajah Kunsman MD, [facility] Campus[facility]ACC 02/03/2017, 12:57 PM

## 2017-02-03 NOTE — Progress Notes (Signed)
Patient admitted to room 231 with the diagnosis of chest pain. Alert and oriented x 4. Denied any acute chest pain at this time. No respiratory distress noted. Skin assessment done with Misty StanleyLisa, skin intact, warm and dry to touch. Tele box called to CCMD by Susa RaringLisa RN and Bennie Hindiana NT. Heparin running at 11 mL/hr, no bleeding/brusising/hematoma noted. Will continue to monitor.

## 2017-02-03 NOTE — Progress Notes (Signed)
Started to decrease pressure in Femstop Device at beginging of shift. Deflated using protocol and MD order. released q5 minutes. Femstop removed. Hemostasis achieved, site remains at a level 0. Will continue to monitor site for any sign of bleeding and or/complication.

## 2017-02-03 NOTE — Progress Notes (Signed)
Report given to ICU nurse-Brad.

## 2017-02-03 NOTE — Progress Notes (Signed)
To cath lab via bed.

## 2017-02-04 ENCOUNTER — Ambulatory Visit: Payer: Medicare Other | Admitting: Internal Medicine

## 2017-02-04 ENCOUNTER — Inpatient Hospital Stay
Admit: 2017-02-04 | Discharge: 2017-02-04 | Disposition: A | Discharge status: Home / Self Care | Payer: Medicare Other | Attending: Family Medicine | Admitting: Family Medicine

## 2017-02-04 ENCOUNTER — Encounter: Payer: Self-pay | Admitting: Internal Medicine

## 2017-02-04 DIAGNOSIS — R079 Chest pain, unspecified: Secondary | ICD-10-CM | POA: Diagnosis not present

## 2017-02-04 DIAGNOSIS — I25119 Atherosclerotic heart disease of native coronary artery with unspecified angina pectoris: Secondary | ICD-10-CM | POA: Diagnosis not present

## 2017-02-04 DIAGNOSIS — E119 Type 2 diabetes mellitus without complications: Secondary | ICD-10-CM | POA: Diagnosis not present

## 2017-02-04 DIAGNOSIS — I2510 Atherosclerotic heart disease of native coronary artery without angina pectoris: Secondary | ICD-10-CM | POA: Diagnosis not present

## 2017-02-04 DIAGNOSIS — E785 Hyperlipidemia, unspecified: Secondary | ICD-10-CM | POA: Diagnosis not present

## 2017-02-04 DIAGNOSIS — K219 Gastro-esophageal reflux disease without esophagitis: Secondary | ICD-10-CM | POA: Diagnosis not present

## 2017-02-04 DIAGNOSIS — I214 Non-ST elevation (NSTEMI) myocardial infarction: Secondary | ICD-10-CM | POA: Diagnosis not present

## 2017-02-04 DIAGNOSIS — I10 Essential (primary) hypertension: Secondary | ICD-10-CM | POA: Diagnosis not present

## 2017-02-04 LAB — GLUCOSE, CAPILLARY
GLUCOSE-CAPILLARY: 169 mg/dL — AB (ref 65–99)
GLUCOSE-CAPILLARY: 188 mg/dL — AB (ref 65–99)
Glucose-Capillary: 155 mg/dL — ABNORMAL HIGH (ref 65–99)
Glucose-Capillary: 284 mg/dL — ABNORMAL HIGH (ref 65–99)

## 2017-02-04 LAB — ECHOCARDIOGRAM COMPLETE
Height: 70 in
Weight: 3312.19 oz

## 2017-02-04 MED ORDER — PERFLUTREN LIPID MICROSPHERE
1.0000 mL | INTRAVENOUS | Status: AC | PRN
  Administered 2017-02-04: 2 mL via INTRAVENOUS
  Filled 2017-02-04: qty 10

## 2017-02-04 MED ORDER — NITROGLYCERIN 0.4 MG SL SUBL: 0.4000 mg | SUBLINGUAL_TABLET | SUBLINGUAL | 1 refills | Status: DC | PRN

## 2017-02-04 MED ORDER — TICAGRELOR 90 MG PO TABS: 90.0000 mg | ORAL_TABLET | Freq: Two times a day (BID) | ORAL | 0 refills | Status: DC

## 2017-02-04 NOTE — Progress Notes (Signed)
*  PRELIMINARY RESULTS* Echocardiogram 2D Echocardiogram has been performed.  Cristela BlueHege, Malanie Koloski 02/04/2017, 2:43 PM

## 2017-02-04 NOTE — Progress Notes (Signed)
Patient alert and oriented. Vitals stable- Dr. Luberta MutterKonidena made aware that patient is ok to be discharged per Dr. Lady GaryFath and also patient's blood sugar is in the high 200's- No new orders.  MD stated that patient will be discharged.

## 2017-02-04 NOTE — Progress Notes (Signed)
[facility]  SUBJECTIVE: no further chest pain   Vitals:   02/04/17 0300 02/04/17 0400 02/04/17 0500 02/04/17 0823  BP: 112/75 138/90 (!) 112/94   Pulse: 75 93 86   Resp: 10 (!) 25 16   Temp:  97.7 F (36.5 C)  97.6 F (36.4 C)  TempSrc:  Oral  Oral  SpO2: 98% 99% 99%   Weight:      Height:        Intake/Output Summary (Last 24 hours) at 02/04/17 1131 Last data filed at 02/04/17 [id number]  Gross per 24 hour  Intake             1635 ml  Output             2300 ml  Net             -665 ml    LABS: Basic Metabolic Panel:  Recent Labs  96/03/5401/04/18 1902  NA 138  K 4.3  CL 101  CO2 27  GLUCOSE 242*  BUN 20  CREATININE 1.04  CALCIUM 9.3   Liver Function Tests: No results for input(s): AST, ALT, ALKPHOS, BILITOT, PROT, ALBUMIN in the last 72 hours. No results for input(s): LIPASE, AMYLASE in the last 72 hours. CBC:  Recent Labs  02/02/17 1758  WBC 8.8  HGB 15.2  HCT 44.1  MCV 95.6  PLT 215   Cardiac Enzymes:  Recent Labs  02/03/17 0112 02/03/17 0451 02/03/17 0723  TROPONINI 0.09* 0.14* 0.19*   BNP: Invalid input(s): POCBNP D-Dimer: No results for input(s): DDIMER in the last 72 hours. Hemoglobin A1C: No results for input(s): HGBA1C in the last 72 hours. Fasting Lipid Panel: No results for input(s): CHOL, HDL, LDLCALC, TRIG, CHOLHDL, LDLDIRECT in the last 72 hours. Thyroid Function Tests: No results for input(s): TSH, T4TOTAL, T3FREE, THYROIDAB in the last 72 hours.  Invalid input(s): FREET3 Anemia Panel: No results for input(s): VITAMINB12, FOLATE, FERRITIN, TIBC, IRON, RETICCTPCT in the last 72 hours.   Physical Exam: Blood pressure (!) 112/94, pulse 86, temperature 97.6 F (36.4 C), temperature source Oral, resp. rate 16, height 5\' 10"  (1.778 m), weight 93.9 kg (207 lb 0.2 oz), SpO2 99 %.   Wt Readings from Last 1 Encounters:  02/03/17 93.9 kg (207 lb 0.2 oz)     General appearance: alert and  cooperative Resp: clear to auscultation bilaterally Cardio: regular rate and rhythm GI: soft, non-tender; bowel sounds normal; no masses,  no organomegaly Skin: Skin color, texture, turgor normal. No rashes or lesions Neurologic: Grossly normal  TELEMETRY: Reviewed telemetry pt in nsr   ASSESSMENT AND PLAN:  Active Problems:   Chest pain, rule out acute myocardial infarction   Angina at rest Gary Gutierrez - [facility](HCC)- cad s/p cabg and recent pci of om svg. OK for discharge on asa 81 mg, brilinta 90 bid, sl nitroglycerin, statin, carvedilol. Follow up with dr. Juliann Gutierrez in 1 week    Gary HeadingKenneth A Tianah Lonardo, MD, [facility]FACC 02/04/2017 11:31 AM

## 2017-02-04 NOTE — Progress Notes (Signed)
Patient discharged- IV and telemetry removed.  Patient given discharge instructions.

## 2017-02-05 NOTE — Discharge Summary (Signed)
Gary Gutierrez, is a 66 y.o. male  DOB 1951-03-22  MRN [id number].  Admission date:  02/02/2017  Admitting Physician  [name], MD  Discharge Date:  02/04/2017   Primary MD  Alwyn Pea, MD  Recommendations for primary care physician for things to follow:   Follow-up with Dr. Juliann Pares in 1 week   Admission Diagnosis  NSTEMI (non-ST elevated myocardial infarction) [facility]) [I21.4]   Discharge Diagnosis  NSTEMI (non-ST elevated myocardial infarction) (HCC) [I21.4]    Active Problems:   Chest pain, rule out acute myocardial infarction   Angina at rest [facility])      Past Medical History:  Diagnosis Date  . Allergy   . Coronary artery disease   . Diabetes mellitus without complication (HCC)   . Hyperlipidemia   . Hypertension     Past Surgical History:  Procedure Laterality Date  . APPENDECTOMY    . colonscopy  2010   benign polyps  . CORONARY ARTERY BYPASS GRAFT  1995  . CORONARY STENT INTERVENTION N/A 02/03/2017   Procedure: Coronary Stent Intervention;  Surgeon: Alwyn Pea, MD;  Location: [facility] INVASIVE CV LAB;  Service: Cardiovascular;  Laterality: N/A;  . ESOPHAGOGASTRODUODENOSCOPY  2010   normal  . LEFT HEART CATH AND CORONARY ANGIOGRAPHY N/A 02/03/2017   Procedure: Left Heart Cath and Coronary Angiography;  Surgeon: Alwyn Pea, MD;  Location: [facility] INVASIVE CV LAB;  Service: Cardiovascular;  Laterality: N/A;  . PERCUTANEOUS CORONARY STENT INTERVENTION (PCI-S)  2010, 2015   4 stents total       History of present illness and  Hospital Course:     Kindly see H&P for history of present illness and admission details, please review complete Labs, Consult reports and Test reports for all details in brief  HPI  from the history and physical done on the day of admission 67 year old  gentleman with a past medical history remarkable for prior CABG 1995 (LIMA-LAD, SVG-DIAG, SVG-RCA), s/p PCI in 2009 (Taxus stent placement to SVG-RCA graft, He was sent to the cath lab for a LHC on 04/18/14 with subsequent two-DES placements to SVG to diagonal and native to diagonal. He was admitted after several days of progressive chest pain.   Hospital Course  1. Chest pain with non-ST elevation WU:JWJXBJYN 0.04,went up to 0.19. Admitted to telemetry, started on heparin drip,Continued on aspirin, statins, beta blockers, nitrates, seen by cardiology, patient is taken to cardiac catheter , cardiac catheter showed Prox LAD lesion, 100 %stenosed.,st RCA lesion, 100 %stenosed.,Successful PCI and stent with DES to SVG to circumflex.Brief episode of no reflow requiring an additional stent to the proximal portion of the original DES,TIMI-3 flow was restored with lesion reduced from 99 down to 0%,  He  was closely monitored in ICU after the cardiac catheter and received  Angiomax  drip briefly., after the cardiac catheter patient did not have any heaviness or chest pain, postoperative course uneventful. Discharge home as per cardiology recommendation. Discharged with aspirin, statins, Brilinta, nitrates. Advised to follow-up with primary cardiologist in 1 week.referred to cardiac rehab.  Patient has moderate to severe LV dysfunction. 30-35%.  2.Diabetes mellitus type 2; glipizide, kombiglyze  #3 hyperlipidemia #4 GERD on PPI  Discharge Condition: stable   Follow UP  Follow-up Information    Alwyn Pea, MD. Go to.   Specialties:  Cardiology, Internal Medicine Why:  Appointment Tuesday February 13th @ 2:45PM Contact information: [id number] [facility] - CARDIOLOGY [place] [address]  [phone]             Discharge Instructions  and  Discharge Medications   Continue aspirin, Brilinta, statins, coreg  nitrates.  Discharge Instructions    AMB Referral to  Cardiac Rehabilitation - Phase II    Complete by:  As directed    Diagnosis:  Coronary Stents     Allergies as of 02/04/2017      Reactions   Penicillins Other (See Comments)   Other reaction(s): Unknown, pt does not know reactions      Medication List    TAKE these medications   ACCU-CHEK AVIVA PLUS test strip Generic drug:  glucose blood TEST BLOOD SUGAR DAILY AS DIRECTED   allopurinol 300 MG tablet Commonly known as:  ZYLOPRIM TAKE 1 TABLET BY MOUTH EVERY DAY   aspirin EC 81 MG tablet Take 81 mg by mouth daily. Reported on 04/10/2016   atorvastatin 80 MG tablet Commonly known as:  LIPITOR TAKE 1 TABLET BY MOUTH AT BEDTIME   carvedilol 25 MG tablet Commonly known as:  COREG Take 1 tablet (25 mg total) by mouth 2 (two) times daily.   cetirizine 10 MG tablet Commonly known as:  ZYRTEC Take 1 tablet by mouth daily.   esomeprazole 40 MG capsule Commonly known as:  NEXIUM Take 40 mg by mouth daily at 12 noon.   glipiZIDE 10 MG 24 hr tablet Commonly known as:  GLUCOTROL XL TAKE 1 TABLET BY MOUTH EVERY DAY   KOMBIGLYZE XR 2.04-999 MG Tb24 Generic drug:  Saxagliptin-Metformin TAKE 2 TABLETS BY MOUTH DAILY   lisinopril 5 MG tablet Commonly known as:  PRINIVIL,ZESTRIL TAKE 1 TABLET BY MOUTH EVERY DAY   meloxicam 15 MG tablet Commonly known as:  MOBIC TAKE 1 TABLET(15 MG) BY MOUTH DAILY   nitroGLYCERIN 0.4 MG SL tablet Commonly known as:  NITROSTAT Place 1 tablet (0.4 mg total) under the tongue every 5 (five) minutes as needed for chest pain.   ticagrelor 90 MG Tabs tablet Commonly known as:  BRILINTA Take 1 tablet (90 mg total) by mouth 2 (two) times daily.         Diet and Activity recommendation: See Discharge Instructions above   Consults obtained;;cardiology  Major procedures and Radiology Reports - PLEASE review detailed and final reports for all details, in brief -     Dg Chest 2 View  Result Date: 02/02/2017 CLINICAL DATA:  Chest pain EXAM:  CHEST  2 VIEW COMPARISON:  10/10/2014 FINDINGS: Cardiac shadow is stable. Postoperative changes are noted. The lungs are well aerated bilaterally. Interstitial changes are seen bilaterally without focal infiltrate. No sizable effusion is noted. Degenerative change of the thoracic spine is noted. IMPRESSION: Chronic changes without acute abnormality Electronically Signed   By: Alcide CleverMark  Lukens M.D.   On: 02/02/2017 18:40    Micro Results     Recent Results (from the past 240 hour(s))  MRSA PCR Screening     Status: None   Collection Time: 02/03/17  5:38 PM  Result Value Ref Range Status   MRSA by PCR NEGATIVE NEGATIVE Final    Comment:        The GeneXpert MRSA Assay (FDA approved for NASAL specimens only), is one component of a comprehensive MRSA colonization surveillance program. It is not intended to diagnose MRSA infection nor to guide or monitor treatment for MRSA infections.        Today   Subjective:   Jerolyn CenterJames Vicuna today has no headache,no chest abdominal pain,no new  weakness tingling or numbness, feels much better wants to go home today.   Objective:   Blood pressure 120/78, pulse 82, temperature 97.6 F (36.4 C), temperature source Oral, resp. rate 17, height 5\' 10"  (1.778 m), weight 93.9 kg (207 lb 0.2 oz), SpO2 96 %.  No intake or output data in the 24 hours ending 02/05/17 1609  Exam Awake Alert, Oriented x 3, No new F.N deficits, Normal affect Terry.AT,PERRAL Supple Neck,No JVD, No cervical lymphadenopathy appriciated.  Symmetrical Chest wall movement, Good air movement bilaterally, CTAB RRR,No Gallops,Rubs or new Murmurs, No Parasternal Heave +ve B.Sounds, Abd Soft, Non tender, No organomegaly appriciated, No rebound -guarding or rigidity. No Cyanosis, Clubbing or edema, No new Rash or bruise  Data Review   CBC w Diff:  Lab Results  Component Value Date   WBC 8.8 02/02/2017   HGB 15.2 02/02/2017   HGB 14.9 10/10/2014   HCT 44.1 02/02/2017   HCT 41.8  01/03/2016   PLT 215 02/02/2017   PLT 180 01/03/2016    CMP:  Lab Results  Component Value Date   NA 138 02/02/2017   NA 141 01/03/2016   NA 142 10/11/2014   K 4.3 02/02/2017   K 4.1 10/11/2014   CL 101 02/02/2017   CL 107 10/11/2014   CO2 27 02/02/2017   CO2 27 10/11/2014   BUN 20 02/02/2017   BUN 14 01/03/2016   BUN 16 10/11/2014   CREATININE 1.04 02/02/2017   CREATININE 1.19 10/11/2014   PROT 6.8 01/03/2016   ALBUMIN 4.2 01/03/2016   BILITOT 0.7 01/03/2016   ALKPHOS 97 01/03/2016   AST 23 01/03/2016   ALT 36 01/03/2016  .   Total Time in preparing paper work, data evaluation and todays exam - 35 minutes  Michaell Grider M.D on 02/04/2017 at 4:09 PM    Note: This dictation was prepared with Dragon dictation along with smaller phrase technology. Any transcriptional errors that result from this process are unintentional.

## 2017-02-11 DIAGNOSIS — I429 Cardiomyopathy, unspecified: Secondary | ICD-10-CM | POA: Diagnosis not present

## 2017-02-11 DIAGNOSIS — I10 Essential (primary) hypertension: Secondary | ICD-10-CM | POA: Diagnosis not present

## 2017-02-11 DIAGNOSIS — Z951 Presence of aortocoronary bypass graft: Secondary | ICD-10-CM | POA: Diagnosis not present

## 2017-02-11 DIAGNOSIS — E119 Type 2 diabetes mellitus without complications: Secondary | ICD-10-CM | POA: Diagnosis not present

## 2017-02-11 DIAGNOSIS — K219 Gastro-esophageal reflux disease without esophagitis: Secondary | ICD-10-CM | POA: Diagnosis not present

## 2017-02-11 DIAGNOSIS — E784 Other hyperlipidemia: Secondary | ICD-10-CM | POA: Diagnosis not present

## 2017-02-11 DIAGNOSIS — M1990 Unspecified osteoarthritis, unspecified site: Secondary | ICD-10-CM | POA: Diagnosis not present

## 2017-02-11 DIAGNOSIS — R0602 Shortness of breath: Secondary | ICD-10-CM | POA: Diagnosis not present

## 2017-02-11 DIAGNOSIS — M25559 Pain in unspecified hip: Secondary | ICD-10-CM | POA: Diagnosis not present

## 2017-02-11 DIAGNOSIS — I2510 Atherosclerotic heart disease of native coronary artery without angina pectoris: Secondary | ICD-10-CM | POA: Diagnosis not present

## 2017-02-11 DIAGNOSIS — E669 Obesity, unspecified: Secondary | ICD-10-CM | POA: Diagnosis not present

## 2017-02-11 DIAGNOSIS — I209 Angina pectoris, unspecified: Secondary | ICD-10-CM | POA: Diagnosis not present

## 2017-02-13 ENCOUNTER — Emergency Department: Payer: Medicare Other

## 2017-02-13 ENCOUNTER — Encounter: Payer: Self-pay | Admitting: *Deleted

## 2017-02-13 ENCOUNTER — Other Ambulatory Visit: Payer: Self-pay

## 2017-02-13 ENCOUNTER — Emergency Department
Admission: EM | Admit: 2017-02-13 | Discharge: 2017-02-13 | Disposition: A | Discharge status: Home / Self Care | Payer: Medicare Other | Attending: Emergency Medicine | Admitting: Emergency Medicine

## 2017-02-13 DIAGNOSIS — I2510 Atherosclerotic heart disease of native coronary artery without angina pectoris: Secondary | ICD-10-CM | POA: Diagnosis not present

## 2017-02-13 DIAGNOSIS — Z7982 Long term (current) use of aspirin: Secondary | ICD-10-CM | POA: Insufficient documentation

## 2017-02-13 DIAGNOSIS — R072 Precordial pain: Secondary | ICD-10-CM | POA: Insufficient documentation

## 2017-02-13 DIAGNOSIS — Z79899 Other long term (current) drug therapy: Secondary | ICD-10-CM | POA: Diagnosis not present

## 2017-02-13 DIAGNOSIS — I10 Essential (primary) hypertension: Secondary | ICD-10-CM | POA: Diagnosis not present

## 2017-02-13 DIAGNOSIS — Z87891 Personal history of nicotine dependence: Secondary | ICD-10-CM | POA: Diagnosis not present

## 2017-02-13 DIAGNOSIS — Z7984 Long term (current) use of oral hypoglycemic drugs: Secondary | ICD-10-CM | POA: Insufficient documentation

## 2017-02-13 DIAGNOSIS — R079 Chest pain, unspecified: Secondary | ICD-10-CM

## 2017-02-13 LAB — BASIC METABOLIC PANEL
ANION GAP: 9 (ref 5–15)
BUN: 17 mg/dL (ref 6–20)
CALCIUM: 8.6 mg/dL — AB (ref 8.9–10.3)
CO2: 25 mmol/L (ref 22–32)
CREATININE: 1.02 mg/dL (ref 0.61–1.24)
Chloride: 100 mmol/L — ABNORMAL LOW (ref 101–111)
GFR calc Af Amer: >60 mL/min (ref >60)
GLUCOSE: 215 mg/dL — AB (ref 65–99)
Potassium: 3.9 mmol/L (ref 3.5–5.1)
Sodium: 134 mmol/L — ABNORMAL LOW (ref 135–145)

## 2017-02-13 LAB — CBC
HCT: 43.2 % (ref 40.0–52.0)
Hemoglobin: 14.4 g/dL (ref 13.0–18.0)
MCH: 31.7 pg (ref 26.0–34.0)
MCHC: 33.4 g/dL (ref 32.0–36.0)
MCV: 94.8 fL (ref 80.0–100.0)
PLATELETS: 234 10*3/uL (ref 150–440)
RBC: 4.56 MIL/uL (ref 4.40–5.90)
RDW: 14.1 % (ref 11.5–14.5)
WBC: 13.4 10*3/uL — AB (ref 3.8–10.6)

## 2017-02-13 LAB — TROPONIN I: TROPONIN I: 0.1 ng/mL — AB (ref <0.03)

## 2017-02-13 MED ORDER — ISOSORBIDE MONONITRATE ER 30 MG PO TB24: 30.0000 mg | ORAL_TABLET | Freq: Every day | ORAL | 0 refills | Status: DC

## 2017-02-13 NOTE — Discharge Instructions (Signed)
Discuss with Dr. Juliann Paresallwood about parameters when to seek emergency dept. Evaluation. Continue antacids as directed.  We recommend call EMS or Emergency department evaluation if chest pain last more than 15 minutes or does not resolve with nitroglycerin sublingual tablets.   Please return immediately if condition worsens. Please contact her primary physician or the physician you were given for referral. If you have any specialist physicians involved in her treatment and plan please also contact them. Thank you for using [place] emergency Department.

## 2017-02-13 NOTE — ED Notes (Signed)

## 2017-02-13 NOTE — ED Provider Notes (Signed)
Time Seen: Approximately *2007  I have reviewed the triage notes  Chief Complaint: Chest Pain   History of Present Illness: Gary Gutierrez is a 66 y.o. male who presents with 3 separate episodes of substernal chest discomfort described as burping and belching and seemed to be after meals. Patient states these episodes last 10 minutes approximately. He denies any shortness of breath, nausea, vomiting. He denies any radiation to the arm child or back region. She was recently hospitalized and had 2 stents placed in bypass paying grafts. He has a hard time tolerating the discomfort between gastrointestinal and cardiovascular   Past Medical History:  Diagnosis Date  . Allergy   . Coronary artery disease   . Diabetes mellitus without complication (HCC)   . Hyperlipidemia   . Hypertension     Patient Active Problem List   Diagnosis Date Noted  . Angina at rest [facility]) 02/03/2017  . Chest pain, rule out acute myocardial infarction 02/02/2017  . Uncontrolled type 2 diabetes mellitus without complication, without long-term current use of insulin (HCC) 08/12/2016  . Hx of colonic polyps 04/10/2016  . Hearing loss of both ears 01/03/2016  . Acid reflux 06/09/2015  . Mixed hyperlipidemia 06/09/2015  . Essential hypertension 06/09/2015  . Controlled gout 06/09/2015  . Multilevel degenerative disc disease 06/09/2015  . Coronary artery disease involving native coronary artery of native heart with angina pectoris (HCC) 05/09/2014    Past Surgical History:  Procedure Laterality Date  . APPENDECTOMY    . colonscopy  2010   benign polyps  . CORONARY ARTERY BYPASS GRAFT  1995  . CORONARY STENT INTERVENTION N/A 02/03/2017   Procedure: Coronary Stent Intervention;  Surgeon: Alwyn Pea, MD;  Location: [facility] INVASIVE CV LAB;  Service: Cardiovascular;  Laterality: N/A;  . ESOPHAGOGASTRODUODENOSCOPY  2010   normal  . LEFT HEART CATH AND CORONARY ANGIOGRAPHY N/A 02/03/2017   Procedure: Left  Heart Cath and Coronary Angiography;  Surgeon: Alwyn Pea, MD;  Location: [facility] INVASIVE CV LAB;  Service: Cardiovascular;  Laterality: N/A;  . PERCUTANEOUS CORONARY STENT INTERVENTION (PCI-S)  2010, 2015   4 stents total    Past Surgical History:  Procedure Laterality Date  . APPENDECTOMY    . colonscopy  2010   benign polyps  . CORONARY ARTERY BYPASS GRAFT  1995  . CORONARY STENT INTERVENTION N/A 02/03/2017   Procedure: Coronary Stent Intervention;  Surgeon: Alwyn Pea, MD;  Location: [facility] INVASIVE CV LAB;  Service: Cardiovascular;  Laterality: N/A;  . ESOPHAGOGASTRODUODENOSCOPY  2010   normal  . LEFT HEART CATH AND CORONARY ANGIOGRAPHY N/A 02/03/2017   Procedure: Left Heart Cath and Coronary Angiography;  Surgeon: Alwyn Pea, MD;  Location: [facility] INVASIVE CV LAB;  Service: Cardiovascular;  Laterality: N/A;  . PERCUTANEOUS CORONARY STENT INTERVENTION (PCI-S)  2010, 2015   4 stents total    Current Outpatient Rx  . Order #: [id number] Class: Normal  . Order #: [id number] Class: Normal  . Order #: [id number] Class: Historical Med  . Order #: [id number] Class: Normal  . Order #: [id number] Class: Normal  . Order #: [id number] Class: Historical Med  . Order #: [id number] Class: Historical Med  . Order #: [id number] Class: Normal  . Order #: [id number] Class: Print  . Order #: [id number] Class: Normal  . Order #: [id number] Class: Normal  . Order #: [id number] Class: Normal  . Order #: [id number] Class: Normal  . Order #: [id number] Class: Normal    Allergies:  Penicillins  Family History: Family History  Problem Relation Age of Onset  .  Diabetes Father   . Diabetes Brother   . Prostate cancer Brother   . Lung cancer Brother     Social History: Social History  Substance Use Topics  . Smoking status: Former Games developer  . Smokeless tobacco: Never Used  . Alcohol use No     Review of Systems:   10 point review of systems was performed and was otherwise  negative:  Constitutional: No fever Eyes: No visual disturbances ENT: No sore throat, ear pain Cardiac:Patient denies current chest pain Respiratory: No shortness of breath, wheezing, or stridor Abdomen: No abdominal pain, no vomiting, No diarrhea Endocrine: No weight loss, No night sweats Extremities: No peripheral edema, cyanosis Skin: No rashes, easy bruising Neurologic: No focal weakness, trouble with speech or swollowing Urologic: No dysuria, Hematuria, or urinary frequency   Physical Exam:  ED Triage Vitals  Enc Vitals Group     BP 02/13/17 1723 (!) 150/72     Pulse Rate 02/13/17 1723 99     Resp 02/13/17 1723 18     Temp 02/13/17 1723 97.9 F (36.6 C)     Temp Source 02/13/17 1723 Oral     SpO2 02/13/17 1723 99 %     Weight 02/13/17 1721 207 lb (93.9 kg)     Height 02/13/17 1721 5\' 10"  (1.778 m)     Head Circumference --      Peak Flow --      Pain Score 02/13/17 1721 2     Pain Loc --      Pain Edu? --      Excl. in GC? --     General: Awake , Alert , and Oriented times 3; GCS 15 Head: Normal cephalic , atraumatic Eyes: Pupils equal , round, reactive to light Nose/Throat: No nasal drainage, patent upper airway without erythema or exudate.  Neck: Supple, Full range of motion, No anterior adenopathy or palpable thyroid masses Lungs: Clear to ascultation without wheezes , rhonchi, or rales Heart: Regular rate, regular rhythm without murmurs , gallops , or rubs Abdomen: Soft, non tender without rebound, guarding , or rigidity; bowel sounds positive and symmetric in all 4 quadrants. No organomegaly .        Extremities: 2 plus symmetric pulses. No edema, clubbing or cyanosis Neurologic: normal ambulation, Motor symmetric without deficits, sensory intact Skin: warm, dry, no rashes   Labs:   All laboratory work was reviewed including any pertinent negatives or positives listed below:  Labs Reviewed  CBC - Abnormal; Notable for the following:       Result Value    WBC 13.4 (*)    All other components within normal limits  TROPONIN I - Abnormal; Notable for the following:    Troponin I 0.10 (*)    All other components within normal limits  BASIC METABOLIC PANEL - Abnormal; Notable for the following:    Sodium 134 (*)    Chloride 100 (*)    Glucose, Bld 215 (*)    Calcium 8.6 (*)    All other components within normal limits  Patient's troponin is elevated though it's actually decreased from his recent admission here to the hospital  EKG:  ED ECG REPORT I, Gary Gutierrez, the attending physician, personally viewed and interpreted this ECG.  Date: 02/13/2017 EKG Time: 1719Rate:98 Rhythm: normal sinus rhythm QRS Axis: normal Intervals: normal ST/T Wave abnormalities: normal Conduction Disturbances: none Narrative Interpretation: unremarkable Left atrial enlargement Poor R-wave progression in the anterior leads with no acute ischemic changes  Radiology:  "Dg Chest 2 View  Result Date: 02/13/2017 CLINICAL DATA:  Chest pain EXAM: CHEST  2 VIEW COMPARISON:  Chest radiograph 02/02/2017 FINDINGS: Unchanged cardiomediastinal contours of the upper limits of normal. Median sternotomy wires and CABG markers are unchanged. No focal airspace consolidation or pulmonary edema. No pleural effusion or pneumothorax. IMPRESSION: No active cardiopulmonary disease. Electronically Signed   By: Deatra RobinsonKevin  Herman M.D.   On: 02/13/2017 18:08   Dg Chest 2 View  Result Date: 02/02/2017 CLINICAL DATA:  Chest pain EXAM: CHEST  2 VIEW COMPARISON:  10/10/2014 FINDINGS: Cardiac shadow is stable. Postoperative changes are noted. The lungs are well aerated bilaterally. Interstitial changes are seen bilaterally without focal infiltrate. No sizable effusion is noted. Degenerative change of the thoracic spine is noted. IMPRESSION: Chronic changes without acute abnormality Electronically Signed   By: Alcide CleverMark  Lukens M.D.   On: 02/02/2017 18:40  " * I personally reviewed the  radiologic studies    ED Course:  Patient's stay was uneventful and knees aren't currently on Plavix and aspirin. I spoke briefly with his cardiologist and lower left it up to the patient on whether or not he was comfortable being discharged on Imdur. Patient says it would prefer to go home at this time and further testing can likely be done on an outpatient basis such as a stress echo etc. Patient was prescribed DM during advised to return if he's has recurrence of his chest pain and it lasts more than 15 minutes before his taken 3 sublingual nitroglycerin without relief. Patient also has a significant history of gastroesophageal reflux disease and he was advised to continue with doubling up on his proton pump inhibitor therapy and to pursue further assessment on an outpatient basis through his primary physician for a gastroenterology evaluation likely endoscopic exam.     Assessment:  Acute unspecified chest pain History of coronary artery disease with 2 recent stents History gastroesophageal reflux disease   F   Plan: Outpatient " New Prescriptions   ISOSORBIDE MONONITRATE (IMDUR) 30 MG 24 HR TABLET    Take 1 tablet (30 mg total) by mouth daily.  " Patient was advised to return immediately if condition worsens. Patient was advised to follow up with their primary care physician or other specialized physicians involved in their outpatient care. The patient and/or family member/power of attorney had laboratory results reviewed at the bedside. All questions and concerns were addressed and appropriate discharge instructions were distributed by the nursing staff. on             Gary MoccasinBrian S Buffey Zabinski, MD 02/13/17 2134

## 2017-02-13 NOTE — ED Notes (Signed)
Redraw for trop sent at 18:18

## 2017-02-13 NOTE — ED Triage Notes (Addendum)
Pt reports 3 separate episidoes of chest tightness since yesterday, states he had stents placed on 2/5, denies any SOB, nausea or vomiting, awake and alert, pt took 3 nitro before arrival, pt is currently on abx for chest cold

## 2017-02-13 NOTE — ED Notes (Signed)
Charge RN informed of Pts critical troponin of 0.10

## 2017-02-28 ENCOUNTER — Other Ambulatory Visit: Payer: Self-pay | Admitting: Internal Medicine

## 2017-02-28 DIAGNOSIS — IMO0001 Reserved for inherently not codable concepts without codable children: Secondary | ICD-10-CM

## 2017-02-28 DIAGNOSIS — E1165 Type 2 diabetes mellitus with hyperglycemia: Principal | ICD-10-CM

## 2017-03-04 ENCOUNTER — Encounter: Payer: Self-pay | Admitting: Internal Medicine

## 2017-03-04 ENCOUNTER — Ambulatory Visit (INDEPENDENT_AMBULATORY_CARE_PROVIDER_SITE_OTHER): Payer: Medicare Other | Admitting: Internal Medicine

## 2017-03-04 ENCOUNTER — Other Ambulatory Visit: Payer: Self-pay | Admitting: Internal Medicine

## 2017-03-04 VITALS — BP 100/60 | HR 84 | Ht 69.0 in | Wt 202.0 lb

## 2017-03-04 DIAGNOSIS — I209 Angina pectoris, unspecified: Secondary | ICD-10-CM | POA: Diagnosis not present

## 2017-03-04 DIAGNOSIS — I25119 Atherosclerotic heart disease of native coronary artery with unspecified angina pectoris: Secondary | ICD-10-CM

## 2017-03-04 DIAGNOSIS — E1165 Type 2 diabetes mellitus with hyperglycemia: Secondary | ICD-10-CM | POA: Diagnosis not present

## 2017-03-04 DIAGNOSIS — I10 Essential (primary) hypertension: Secondary | ICD-10-CM | POA: Diagnosis not present

## 2017-03-04 DIAGNOSIS — K219 Gastro-esophageal reflux disease without esophagitis: Secondary | ICD-10-CM

## 2017-03-04 DIAGNOSIS — IMO0001 Reserved for inherently not codable concepts without codable children: Secondary | ICD-10-CM

## 2017-03-04 MED ORDER — ESOMEPRAZOLE MAGNESIUM 40 MG PO CPDR: 40.0000 mg | DELAYED_RELEASE_CAPSULE | Freq: Two times a day (BID) | ORAL | 5 refills | Status: DC

## 2017-03-04 MED ORDER — EMPAGLIFLOZIN 25 MG PO TABS: 25.0000 mg | ORAL_TABLET | Freq: Every day | ORAL | 3 refills | Status: DC

## 2017-03-04 MED ORDER — PANTOPRAZOLE SODIUM 40 MG PO TBEC: 40.0000 mg | DELAYED_RELEASE_TABLET | Freq: Two times a day (BID) | ORAL | 5 refills | Status: DC

## 2017-03-04 NOTE — Progress Notes (Signed)
Date:  03/04/2017   Name:  Gary Gutierrez Needs   DOB:  12-Aug-1951   MRN:  [id number]   Chief Complaint: Diabetes (Needs maintenance meds filled. Wants to discuss NEXIUM.) Diabetes  He presents for his follow-up diabetic visit. He has type 2 diabetes mellitus. Hypoglycemia symptoms include nervousness/anxiousness. Pertinent negatives for hypoglycemia include no dizziness, headaches or tremors. Associated symptoms include chest pain. Pertinent negatives for diabetes include no fatigue, no polydipsia and no polyuria. Current diabetic treatment includes oral agent (triple therapy). An ACE inhibitor/angiotensin II receptor blocker is being taken.  Hypertension  This is a chronic problem. The current episode started more than 1 year ago. The problem is unchanged. The problem is controlled. Associated symptoms include chest pain. Pertinent negatives include no headaches, palpitations or shortness of breath. Past treatments include ACE inhibitors and beta blockers.  Gastroesophageal Reflux  He complains of chest pain and heartburn. He reports no abdominal pain, no coughing or no wheezing. This is a recurrent problem. Pertinent negatives include no fatigue. He has tried a PPI and an antacid for the symptoms. The treatment provided moderate relief.  Chest Pain   This is a recurrent problem. The problem has been resolved. The pain is present in the epigastric region. Pertinent negatives include no abdominal pain, cough, dizziness, headaches, numbness, palpitations or shortness of breath.  His past medical history is significant for hypertension.  Angina - He was admitted with chest pain and MI.  Cath showed  99% stenosis of bypass to LAD which was stented. Then seen in ER several days later with persistent chest pain.  He was discharged on IMDUR and higher dose PPI.  He has used NTG 4 times since hospital discharge.  He is now on Plavix - nexium was stopped and he was started on Protonix.  Lab Results    Component Value Date   HGBA1C 8.2 (H) 08/12/2016     Review of Systems  Constitutional: Negative for appetite change, fatigue and unexpected weight change.  HENT: Negative for congestion.   Eyes: Negative for visual disturbance.  Respiratory: Negative for cough, shortness of breath and wheezing.   Cardiovascular: Positive for chest pain. Negative for palpitations and leg swelling.  Gastrointestinal: Positive for heartburn. Negative for abdominal pain and blood in stool.       Reflux  Endocrine: Negative for polydipsia and polyuria.  Genitourinary: Negative for dysuria and hematuria.  Musculoskeletal: Negative for arthralgias and gait problem.  Skin: Negative for color change and rash.  Neurological: Negative for dizziness, tremors, numbness and headaches.  Hematological: Negative for adenopathy.  Psychiatric/Behavioral: Negative for dysphoric mood and sleep disturbance. The patient is nervous/anxious.     Patient Active Problem List   Diagnosis Date Noted  . Uncontrolled type 2 diabetes mellitus without complication, without long-term current use of insulin (HCC) 08/12/2016  . Hx of colonic polyps 04/10/2016  . Hearing loss of both ears 01/03/2016  . Acid reflux 06/09/2015  . Mixed hyperlipidemia 06/09/2015  . Essential hypertension 06/09/2015  . Controlled gout 06/09/2015  . Multilevel degenerative disc disease 06/09/2015  . Coronary artery disease involving native coronary artery of native heart with angina pectoris (HCC) 05/09/2014    Prior to Admission medications   Medication Sig Start Date End Date Taking? Authorizing Provider  ACCU-CHEK AVIVA PLUS test strip TEST BLOOD SUGAR DAILY AS DIRECTED 11/03/16   Reubin MilanLaura H Tyquarius Paglia, MD  allopurinol (ZYLOPRIM) 300 MG tablet TAKE 1 TABLET BY MOUTH EVERY DAY 02/28/17   Nyoka CowdenLaura H  [name], MD  aspirin EC 81 MG tablet Take 81 mg by mouth daily. Reported on 04/10/2016    Historical Provider, MD  atorvastatin (LIPITOR) 80 MG tablet TAKE 1  TABLET BY MOUTH AT BEDTIME 04/22/16   [name], MD  carvedilol (COREG) 25 MG tablet Take 1 tablet (25 mg total) by mouth 2 (two) times daily. 08/12/16   [name], MD  cetirizine (ZYRTEC) 10 MG tablet Take 1 tablet by mouth daily. 11/09/15   Historical Provider, MD  esomeprazole (NEXIUM) 40 MG capsule Take 40 mg by mouth daily at 12 noon.    Historical Provider, MD  glipiZIDE (GLUCOTROL XL) 10 MG 24 hr tablet TAKE 1 TABLET BY MOUTH EVERY DAY 12/04/16   [name], MD  isosorbide mononitrate (IMDUR) 30 MG 24 hr tablet Take 1 tablet (30 mg total) by mouth daily. 02/13/17 02/13/18  Jennye Moccasin, MD         KOMBIGLYZE XR 2.04-999 MG TB24 TAKE 2 TABLETS BY MOUTH DAILY 02/28/17   [name], MD  lisinopril (PRINIVIL,ZESTRIL) 5 MG tablet TAKE 1 TABLET BY MOUTH EVERY DAY 02/02/17   [name], MD  meloxicam (MOBIC) 15 MG tablet TAKE 1 TABLET(15 MG) BY MOUTH DAILY 01/17/17   [name], MD  nitroGLYCERIN (NITROSTAT) 0.4 MG SL tablet Place 1 tablet (0.4 mg total) under the tongue every 5 (five) minutes as needed for chest pain. 02/04/17   [name], MD  ticagrelor (BRILINTA) 90 MG TABS tablet Take 1 tablet (90 mg total) by mouth 2 (two) times daily. 02/04/17   [name], MD    Allergies  Allergen Reactions  . Penicillins Other (See Comments)    Other reaction(s): Unknown, pt does not know reactions    Past Surgical History:  Procedure Laterality Date  . APPENDECTOMY    . colonscopy  2010   benign polyps  . CORONARY ARTERY BYPASS GRAFT  1995  . CORONARY STENT INTERVENTION N/A 02/03/2017   Procedure: Coronary Stent Intervention;  Surgeon: Alwyn Pea, MD;  Location: [facility] INVASIVE CV LAB;  Service: Cardiovascular;  Laterality: N/A;  . ESOPHAGOGASTRODUODENOSCOPY  2010   normal  . LEFT HEART CATH AND CORONARY ANGIOGRAPHY N/A 02/03/2017   Procedure: Left Heart Cath and Coronary Angiography;  Surgeon: Alwyn Pea, MD;  Location: [facility] INVASIVE CV  LAB;  Service: Cardiovascular;  Laterality: N/A;  . PERCUTANEOUS CORONARY STENT INTERVENTION (PCI-S)  2010, 2015   4 stents total    Social History  Substance Use Topics  . Smoking status: Former Games developer  . Smokeless tobacco: Never Used  . Alcohol use No     Medication list has been reviewed and updated.   Physical Exam  Constitutional: He is oriented to person, place, and time. He appears well-developed. No distress.  HENT:  Head: Normocephalic and atraumatic.  Neck: Normal range of motion. Neck supple. Carotid bruit is not present. No thyromegaly present.  Cardiovascular: Normal rate, regular rhythm and normal heart sounds.   Pulmonary/Chest: Effort normal and breath sounds normal. No respiratory distress. He has no wheezes.  Abdominal: Soft. Normal appearance and bowel sounds are normal. There is no hepatosplenomegaly. There is no tenderness. There is no rebound and no CVA tenderness.  Musculoskeletal: Normal range of motion.  Neurological: He is alert and oriented to person, place, and time.  Skin: Skin is warm and dry. No rash noted.  Psychiatric: He has a normal mood and affect. His speech is normal and behavior  is normal. Thought content normal.  Nursing note and vitals reviewed.   BP 100/60   Pulse 84   Ht 5\' 9"  (1.753 m)   Wt 202 lb (91.6 kg)   SpO2 97%   BMI 29.83 kg/m   Assessment and Plan: 1. Uncontrolled type 2 diabetes mellitus without complication, without long-term current use of insulin (HCC) Will add jardiance for improved control and CVD protection - Comprehensive metabolic panel - Hemoglobin A1c  2. Essential hypertension controlled  3. Coronary artery disease involving native coronary artery of native heart with angina pectoris (HCC) Now on IMDUR - recommend close follow up with Cardiology since he still has episodes of angina  4. Gastroesophageal reflux disease without esophagitis Off of nexium - double protonix Refer for possible EGD -  Ambulatory referral to Gastroenterology   Meds ordered this encounter  Medications  . empagliflozin (JARDIANCE) 25 MG TABS tablet    Sig: Take 25 mg by mouth daily.    Dispense:  30 tablet    Refill:  3  . DISCONTD: esomeprazole (NEXIUM) 40 MG capsule    Sig: Take 1 capsule (40 mg total) by mouth 2 (two) times daily before a meal.    Dispense:  60 capsule    Refill:  5  . pantoprazole (PROTONIX) 40 MG tablet    Sig: Take 1 tablet (40 mg total) by mouth 2 (two) times daily.    Dispense:  60 tablet    Refill:  5    Bari Edward, MD [facility]  03/04/2017

## 2017-03-04 NOTE — Patient Instructions (Addendum)
DM meds - Kombiglyze and Glipizide (Jardiance added)  Do not take Nexium

## 2017-03-05 LAB — COMPREHENSIVE METABOLIC PANEL
ALK PHOS: 148 IU/L — AB (ref 39–117)
ALT: 30 IU/L (ref 0–44)
AST: 19 IU/L (ref 0–40)
Albumin/Globulin Ratio: 1.5 (ref 1.2–2.2)
Albumin: 4.2 g/dL (ref 3.6–4.8)
BILIRUBIN TOTAL: 0.6 mg/dL (ref 0.0–1.2)
BUN/Creatinine Ratio: 12 (ref 10–24)
BUN: 14 mg/dL (ref 8–27)
CHLORIDE: 98 mmol/L (ref 96–106)
CO2: 23 mmol/L (ref 18–29)
CREATININE: 1.14 mg/dL (ref 0.76–1.27)
Calcium: 9.2 mg/dL (ref 8.6–10.2)
GFR calc non Af Amer: 67 mL/min/{1.73_m2} (ref >59)
GFR, EST AFRICAN AMERICAN: 77 mL/min/{1.73_m2} (ref >59)
GLOBULIN, TOTAL: 2.8 g/dL (ref 1.5–4.5)
GLUCOSE: 260 mg/dL — AB (ref 65–99)
Potassium: 4.9 mmol/L (ref 3.5–5.2)
SODIUM: 138 mmol/L (ref 134–144)
TOTAL PROTEIN: 7 g/dL (ref 6.0–8.5)

## 2017-03-05 LAB — HEMOGLOBIN A1C
Est. average glucose Bld gHb Est-mCnc: 148 mg/dL
HEMOGLOBIN A1C: 6.8 % — AB (ref 4.8–5.6)

## 2017-03-05 NOTE — Progress Notes (Signed)
Called pt with Lab result. Told to stop Glimepiride but continue other meds. Pt stated the medication prescribed yesterday (Pantoprazole) doesn't do anything for him. He stated he used medication previously and it does not help. Wondering if other alternatives for him? Also, asking to get refill on Lisinopril. Both medications send to [facility] in [place].

## 2017-03-14 ENCOUNTER — Telehealth: Payer: Self-pay | Admitting: Gastroenterology

## 2017-03-14 NOTE — Telephone Encounter (Signed)
Left voice message for patient to call and schedule appointment with GI for reflux

## 2017-04-10 ENCOUNTER — Ambulatory Visit (INDEPENDENT_AMBULATORY_CARE_PROVIDER_SITE_OTHER): Payer: Medicare Other | Admitting: Gastroenterology

## 2017-04-10 ENCOUNTER — Encounter: Payer: Self-pay | Admitting: Gastroenterology

## 2017-04-10 ENCOUNTER — Other Ambulatory Visit: Payer: Self-pay

## 2017-04-10 VITALS — BP 112/72 | HR 89 | Temp 97.6°F | Ht 69.0 in | Wt 199.5 lb

## 2017-04-10 DIAGNOSIS — R0789 Other chest pain: Secondary | ICD-10-CM

## 2017-04-10 DIAGNOSIS — I25119 Atherosclerotic heart disease of native coronary artery with unspecified angina pectoris: Secondary | ICD-10-CM | POA: Diagnosis not present

## 2017-04-10 DIAGNOSIS — K219 Gastro-esophageal reflux disease without esophagitis: Secondary | ICD-10-CM

## 2017-04-10 DIAGNOSIS — F17201 Nicotine dependence, unspecified, in remission: Secondary | ICD-10-CM | POA: Insufficient documentation

## 2017-04-10 DIAGNOSIS — E119 Type 2 diabetes mellitus without complications: Secondary | ICD-10-CM | POA: Insufficient documentation

## 2017-04-10 DIAGNOSIS — R0609 Other forms of dyspnea: Secondary | ICD-10-CM

## 2017-04-10 NOTE — Progress Notes (Signed)
Gastroenterology Consultation  Referring Provider:     [name], MD Primary Care Physician:  Bari Edward, MD Primary Gastroenterologist:  Dr. Servando Snare     Reason for Consultation:     GERD         HPI:   Gary Gutierrez is a 66 y.o. y/o male referred for consultation & management of GERD by Dr. Bari Edward, MD.  This patient comes in today after being seen in the emergency room with noncardiac chest pain. The patient states that he had been on Nexium with good results from his Nexium but when he had a recent stent put in 4 months ago he was told that he cannot take Nexium and was put on Protonix. The patient states that in the past she has taken Protonix without any help of his acid reflux and noncardiac chest pain but he states now it seems to be working. The patient denies any dysphagia or overt heartburn on the Protonix but he does report that when he bends over he has regurgitation and reflux with abdominal pain. The patient has lost approximately 45 pounds but this has been over many years. The patient states he has had multiple heart attacks including many years ago with his first heart attack. There is no report of any black stools or bloody stools. The patient does have a history of having colon polyps in the past and was supposed to have a repeat colonoscopy. The patient denies any dysphagia but does state that food sometimes feels like it goes down slowly although it does not get stuck.  Past Medical History:  Diagnosis Date  . Allergy   . Coronary artery disease   . Diabetes mellitus without complication (HCC)   . Hyperlipidemia   . Hypertension     Past Surgical History:  Procedure Laterality Date  . APPENDECTOMY    . colonscopy  2010   benign polyps  . CORONARY ARTERY BYPASS GRAFT  1995  . CORONARY STENT INTERVENTION N/A 02/03/2017   Procedure: Coronary Stent Intervention;  Surgeon: Alwyn Pea, MD;  Location: [facility] INVASIVE CV LAB;  Service:  Cardiovascular;  Laterality: N/A;  . ESOPHAGOGASTRODUODENOSCOPY  2010   normal  . LEFT HEART CATH AND CORONARY ANGIOGRAPHY N/A 02/03/2017   Procedure: Left Heart Cath and Coronary Angiography;  Surgeon: Alwyn Pea, MD;  Location: [facility] INVASIVE CV LAB;  Service: Cardiovascular;  Laterality: N/A;  . PERCUTANEOUS CORONARY STENT INTERVENTION (PCI-S)  2010, 2015   4 stents total    Prior to Admission medications   Medication Sig Start Date End Date Taking? Authorizing Provider  ACCU-CHEK AVIVA PLUS test strip TEST BLOOD SUGAR DAILY AS DIRECTED 11/03/16  Yes [name], MD  allopurinol (ZYLOPRIM) 300 MG tablet TAKE 1 TABLET BY MOUTH EVERY DAY 02/28/17  Yes [name], MD  ALPRAZolam Prudy Feeler) 0.5 MG tablet TK 1 T PO BID PRN 02/11/17  Yes Historical Provider, MD  aspirin EC 81 MG tablet Take 81 mg by mouth daily. Reported on 04/10/2016   Yes Historical Provider, MD  atorvastatin (LIPITOR) 80 MG tablet TAKE 1 TABLET BY MOUTH AT BEDTIME 04/22/16  Yes [name], MD  BRILINTA 90 MG TABS tablet  02/05/17  Yes Historical Provider, MD  carvedilol (COREG) 25 MG tablet Take 1 tablet (25 mg total) by mouth 2 (two) times daily. 08/12/16  Yes [name], MD  cetirizine (ZYRTEC) 10 MG tablet Take 1 tablet by mouth daily. 11/09/15  Yes  Historical Provider, MD  citalopram (CELEXA) 20 MG tablet  02/11/17  Yes Historical Provider, MD  clopidogrel (PLAVIX) 75 MG tablet Take 75 mg by mouth daily.   Yes Historical Provider, MD  empagliflozin (JARDIANCE) 25 MG TABS tablet Take 25 mg by mouth daily. 03/04/17  Yes [name], MD  glipiZIDE (GLUCOTROL XL) 10 MG 24 hr tablet TAKE 1 TABLET BY MOUTH EVERY DAY 12/04/16  Yes [name], MD  isosorbide mononitrate (IMDUR) 30 MG 24 hr tablet Take 1 tablet (30 mg total) by mouth daily. 02/13/17 02/13/18 Yes Jennye Moccasin, MD  KOMBIGLYZE XR 2.04-999 MG TB24 TAKE 2 TABLETS BY MOUTH DAILY 02/28/17  Yes [name], MD  lisinopril (PRINIVIL,ZESTRIL) 5 MG  tablet TAKE 1 TABLET BY MOUTH EVERY DAY 03/04/17  Yes [name], MD  meloxicam (MOBIC) 15 MG tablet TAKE 1 TABLET(15 MG) BY MOUTH DAILY 01/17/17  Yes [name], MD  nitroGLYCERIN (NITROSTAT) 0.4 MG SL tablet Place 1 tablet (0.4 mg total) under the tongue every 5 (five) minutes as needed for chest pain. 02/04/17  Yes [name], MD  omeprazole (PRILOSEC) 40 MG capsule  01/30/17  Yes Historical Provider, MD  pantoprazole (PROTONIX) 40 MG tablet Take 1 tablet (40 mg total) by mouth 2 (two) times daily. 03/04/17  Yes [name], MD    Family History  Problem Relation Age of Onset  . Diabetes Father   . Diabetes Brother   . Prostate cancer Brother   . Lung cancer Brother      Social History  Substance Use Topics  . Smoking status: Former Games developer  . Smokeless tobacco: Never Used  . Alcohol use No    Allergies as of 04/10/2017 - Review Complete 04/10/2017  Allergen Reaction Noted  . Penicillins Other (See Comments) 05/14/2015    Review of Systems:    All systems reviewed and negative except where noted in HPI.   Physical Exam:  BP 112/72   Pulse 89   Temp 97.6 F (36.4 C) (Oral)   Ht  (1.753 m)   Wt 199 lb 8 oz (90.5 kg)   BMI 29.46 kg/m  No LMP for male patient. Psych:  Alert and cooperative. Normal mood and affect. General:   Alert,  Well-developed, well-nourished, pleasant and cooperative in NAD Head:  Normocephalic and atraumatic. Eyes:  Sclera clear, no icterus.   Conjunctiva pink. Ears:  Normal auditory acuity. Nose:  No deformity, discharge, or lesions. Mouth:  No deformity or lesions,oropharynx pink & moist. Neck:  Supple; no masses or thyromegaly. Lungs:  Respirations even and unlabored.  Clear throughout to auscultation.   No wheezes, crackles, or rhonchi. No acute distress. Heart:  Regular rate and rhythm; no murmurs, clicks, rubs, or gallops. Abdomen:  Normal bowel sounds.  No bruits.  Soft, non-tender and non-distended without masses,  hepatosplenomegaly or hernias noted.  No guarding or rebound tenderness.  Negative Carnett sign.   Rectal:  Deferred.  Msk:  Symmetrical without gross deformities.  Good, equal movement & strength bilaterally. Pulses:  Normal pulses noted. Extremities:  No clubbing or edema.  No cyanosis. Neurologic:  Alert and oriented x3;  grossly normal neurologically. Skin:  Intact without significant lesions or rashes.  No jaundice. Lymph Nodes:  No significant cervical adenopathy. Psych:  Alert and cooperative. Normal mood and affect.  Imaging Studies: No results found.  Assessment and Plan:   Gary Gutierrez is a 66 y.o. y/o male who comes in today  after a visit to the ER due to noncardiac chest pain. The patient had a prescription for Protonix given to him and he states this has decreased his symptoms. The patient also reports that he likes to drink very cold drinks and sometimes he gets the chest pain with that and this likely represents esophageal spasms. The patient has been told to try avoiding drinking very cold drinks and systemic his Protonix. Because the patient has a new stent within the last 4 months he cannot be set up for an EGD or colonoscopy at this point. The patient has been told that he will need a repeat colonoscopy due to his history of polyps and an EGD because of his chronic heartburn and 8 months when he can stop the Plavix. The patient has been explained the plan and agrees with it.    Midge Minium, MD. Clementeen Graham   Note: This dictation was prepared with Dragon dictation along with smaller phrase technology. Any transcriptional errors that result from this process are unintentional.

## 2017-04-29 ENCOUNTER — Other Ambulatory Visit: Payer: Self-pay | Admitting: Internal Medicine

## 2017-05-12 DIAGNOSIS — Z955 Presence of coronary angioplasty implant and graft: Secondary | ICD-10-CM | POA: Diagnosis not present

## 2017-05-12 DIAGNOSIS — I429 Cardiomyopathy, unspecified: Secondary | ICD-10-CM | POA: Diagnosis not present

## 2017-05-12 DIAGNOSIS — I2510 Atherosclerotic heart disease of native coronary artery without angina pectoris: Secondary | ICD-10-CM | POA: Diagnosis not present

## 2017-05-12 DIAGNOSIS — E784 Other hyperlipidemia: Secondary | ICD-10-CM | POA: Diagnosis not present

## 2017-05-12 DIAGNOSIS — I208 Other forms of angina pectoris: Secondary | ICD-10-CM | POA: Diagnosis not present

## 2017-05-12 DIAGNOSIS — M1990 Unspecified osteoarthritis, unspecified site: Secondary | ICD-10-CM | POA: Diagnosis not present

## 2017-05-12 DIAGNOSIS — R0602 Shortness of breath: Secondary | ICD-10-CM | POA: Diagnosis not present

## 2017-05-12 DIAGNOSIS — E669 Obesity, unspecified: Secondary | ICD-10-CM | POA: Diagnosis not present

## 2017-05-12 DIAGNOSIS — I10 Essential (primary) hypertension: Secondary | ICD-10-CM | POA: Diagnosis not present

## 2017-05-12 DIAGNOSIS — Z951 Presence of aortocoronary bypass graft: Secondary | ICD-10-CM | POA: Diagnosis not present

## 2017-05-12 DIAGNOSIS — K219 Gastro-esophageal reflux disease without esophagitis: Secondary | ICD-10-CM | POA: Diagnosis not present

## 2017-06-10 ENCOUNTER — Other Ambulatory Visit: Payer: Self-pay | Admitting: Internal Medicine

## 2017-06-10 ENCOUNTER — Ambulatory Visit (INDEPENDENT_AMBULATORY_CARE_PROVIDER_SITE_OTHER): Payer: Medicare Other | Admitting: Internal Medicine

## 2017-06-10 ENCOUNTER — Encounter: Payer: Self-pay | Admitting: Internal Medicine

## 2017-06-10 VITALS — BP 112/68 | HR 87 | Ht 69.0 in | Wt 197.0 lb

## 2017-06-10 DIAGNOSIS — I209 Angina pectoris, unspecified: Secondary | ICD-10-CM

## 2017-06-10 DIAGNOSIS — Z0000 Encounter for general adult medical examination without abnormal findings: Secondary | ICD-10-CM

## 2017-06-10 DIAGNOSIS — E1165 Type 2 diabetes mellitus with hyperglycemia: Secondary | ICD-10-CM | POA: Diagnosis not present

## 2017-06-10 DIAGNOSIS — Z86010 Personal history of colonic polyps: Secondary | ICD-10-CM | POA: Diagnosis not present

## 2017-06-10 DIAGNOSIS — Z23 Encounter for immunization: Secondary | ICD-10-CM

## 2017-06-10 DIAGNOSIS — I739 Peripheral vascular disease, unspecified: Secondary | ICD-10-CM

## 2017-06-10 DIAGNOSIS — Z1159 Encounter for screening for other viral diseases: Secondary | ICD-10-CM

## 2017-06-10 DIAGNOSIS — N401 Enlarged prostate with lower urinary tract symptoms: Secondary | ICD-10-CM

## 2017-06-10 DIAGNOSIS — E782 Mixed hyperlipidemia: Secondary | ICD-10-CM | POA: Diagnosis not present

## 2017-06-10 DIAGNOSIS — I25119 Atherosclerotic heart disease of native coronary artery with unspecified angina pectoris: Secondary | ICD-10-CM

## 2017-06-10 DIAGNOSIS — K219 Gastro-esophageal reflux disease without esophagitis: Secondary | ICD-10-CM | POA: Diagnosis not present

## 2017-06-10 DIAGNOSIS — I10 Essential (primary) hypertension: Secondary | ICD-10-CM

## 2017-06-10 DIAGNOSIS — R351 Nocturia: Secondary | ICD-10-CM | POA: Diagnosis not present

## 2017-06-10 DIAGNOSIS — IMO0001 Reserved for inherently not codable concepts without codable children: Secondary | ICD-10-CM

## 2017-06-10 LAB — POCT URINALYSIS DIPSTICK
Bilirubin, UA: NEGATIVE
Blood, UA: NEGATIVE
Ketones, UA: NEGATIVE
LEUKOCYTES UA: NEGATIVE
NITRITE UA: NEGATIVE
PROTEIN UA: NEGATIVE
SPEC GRAV UA: 1.01 (ref 1.010–1.025)
UROBILINOGEN UA: 0.2 U/dL
pH, UA: 5 (ref 5.0–8.0)

## 2017-06-10 NOTE — Patient Instructions (Addendum)
                  Schedule DM eye exam  Schedule Dermatology evaluation and skin survery  Health Maintenance  Topic Date Due  . Hepatitis C Screening  Jun 28, 1951  . OPHTHALMOLOGY EXAM  04/27/1960  . PNA vac Low Risk Adult (2 of 2 - PPSV23) 04/10/2017  . COLONOSCOPY  12/10/2017 (Originally 06/13/2014)  . INFLUENZA VACCINE  07/30/2017  . HEMOGLOBIN A1C  09/04/2017  . FOOT EXAM  06/10/2018  . TETANUS/TDAP  08/17/2023

## 2017-06-10 NOTE — Progress Notes (Signed)
Patient: Gary Gutierrez, Male    DOB: March 08, 1951, 66 y.o.   MRN: [id number] Visit Date: 06/10/2017  Today's Provider: Bari Edward, MD   Chief Complaint  Patient presents with  . Medicare Wellness  . Diabetes  . Hypertension  . Hyperlipidemia  . Coronary Artery Disease   Subjective:    Annual wellness visit Gary Gutierrez is a 66 y.o. male who presents today for his Subsequent Annual Wellness Visit. He feels fairly well. He reports exercising very little. He reports he is sleeping fairly well. He has noticed that his left leg feels heavy at time of prolonged exertion and reports hx of PVD of distal aorta noted over 10 years ago.  ----------------------------------------------------------- Diabetes  He presents for his follow-up diabetic visit. He has type 2 diabetes mellitus. Pertinent negatives for hypoglycemia include no dizziness, headaches or nervousness/anxiousness. Associated symptoms include weakness (in LLE with extreme exertion). Pertinent negatives for diabetes include no chest pain, no fatigue and no foot paresthesias. Diabetic complications include heart disease. Pertinent negatives for diabetic complications include no peripheral neuropathy. Current diabetic treatment includes oral agent (triple therapy). He is compliant with treatment most of the time. His weight is decreasing steadily. He is following a diabetic diet. He monitors blood glucose at home 1-2 x per day. His breakfast blood glucose is taken between 6-7 am. His breakfast blood glucose range is generally 110-130 mg/dl. An ACE inhibitor/angiotensin II receptor blocker is being taken. Eye exam is not current.  Hypertension  This is a chronic problem. The problem is controlled. Pertinent negatives include no chest pain, headaches, palpitations or shortness of breath. Past treatments include beta blockers, ACE inhibitors and direct vasodilators. Hypertensive end-organ damage includes CAD/MI.  Hyperlipidemia    This is a chronic problem. Exacerbating diseases include diabetes. Pertinent negatives include no chest pain, myalgias or shortness of breath. Current antihyperlipidemic treatment includes statins.  Coronary Artery Disease  Presents for follow-up (followed by Dr. Juliann Pares- recent intervention now on Brillinta) visit. Pertinent negatives include no chest pain, dizziness, leg swelling, palpitations or shortness of breath. Risk factors include hyperlipidemia and hypertension. The symptoms have been stable. Compliance with diet is variable. Compliance with exercise is variable. Compliance with medications is variable.  Gastroesophageal Reflux  He complains of heartburn. He reports no abdominal pain (no gerd), no chest pain, no choking or no wheezing. This is a chronic problem. The problem occurs occasionally. The symptoms are aggravated by lying down and bending. Pertinent negatives include no fatigue. Risk factors include smoking/tobacco exposure. He has tried a PPI for the symptoms. The treatment provided moderate relief. needs EGD and Colonoscopy in December.    Review of Systems  Constitutional: Negative for appetite change, chills, diaphoresis, fatigue and unexpected weight change.  HENT: Negative for hearing loss, tinnitus, trouble swallowing and voice change.   Eyes: Negative for visual disturbance.  Respiratory: Negative for choking, shortness of breath and wheezing.   Cardiovascular: Negative for chest pain, palpitations and leg swelling.       Leg heaviness with exertion - hx of PVD of distal aorta that did not require intervention  Gastrointestinal: Positive for heartburn. Negative for abdominal pain (no gerd), blood in stool, constipation and diarrhea.  Genitourinary: Positive for frequency (nocturia). Negative for difficulty urinating and dysuria.  Musculoskeletal: Negative for arthralgias, back pain and myalgias.  Skin: Negative for color change and rash.  Neurological: Positive for  weakness (in LLE with extreme exertion). Negative for dizziness, syncope, numbness and headaches.  Hematological: Negative  for adenopathy.  Psychiatric/Behavioral: Negative for dysphoric mood and sleep disturbance. The patient is not nervous/anxious.     Social History   Social History  . Marital status: Married    Spouse name: N/A  . Number of children: N/A  . Years of education: N/A   Occupational History  . Not on file.   Social History Main Topics  . Smoking status: Former Games developer  . Smokeless tobacco: Never Used  . Alcohol use No  . Drug use: No  . Sexual activity: Not on file   Other Topics Concern  . Not on file   Social History Narrative  . No narrative on file    Patient Active Problem List   Diagnosis Date Noted  . DOE (dyspnea on exertion) 04/10/2017  . Tobacco abuse 04/10/2017  . Uncontrolled type 2 diabetes mellitus without complication, without long-term current use of insulin (HCC) 08/12/2016  . Hx of colonic polyps 04/10/2016  . Hearing loss of both ears 01/03/2016  . Acid reflux 06/09/2015  . Mixed hyperlipidemia 06/09/2015  . Essential hypertension 06/09/2015  . Controlled gout 06/09/2015  . Multilevel degenerative disc disease 06/09/2015  . Subendocardial myocardial infarction (HCC) 05/11/2014  . Coronary artery disease involving native coronary artery of native heart with angina pectoris (HCC) 05/09/2014  . Coronary artery disease involving autologous vein bypass graft 05/09/2014    Past Surgical History:  Procedure Laterality Date  . APPENDECTOMY    . colonscopy  2010   benign polyps  . CORONARY ARTERY BYPASS GRAFT  1995  . CORONARY STENT INTERVENTION N/A 02/03/2017   Procedure: Coronary Stent Intervention;  Surgeon: Alwyn Pea, MD;  Location: [facility] INVASIVE CV LAB;  Service: Cardiovascular;  Laterality: N/A;  . ESOPHAGOGASTRODUODENOSCOPY  2010   normal  . LEFT HEART CATH AND CORONARY ANGIOGRAPHY N/A 02/03/2017   Procedure: Left Heart  Cath and Coronary Angiography;  Surgeon: Alwyn Pea, MD;  Location: [facility] INVASIVE CV LAB;  Service: Cardiovascular;  Laterality: N/A;  . PERCUTANEOUS CORONARY STENT INTERVENTION (PCI-S)  2010, 2015   4 stents total    His family history includes Diabetes in his brother and father; Lung cancer in his brother; Prostate cancer in his brother.     Current Outpatient Prescriptions on File Prior to Visit  Medication Sig Dispense Refill  . ACCU-CHEK AVIVA PLUS test strip TEST BLOOD SUGAR DAILY AS DIRECTED 100 each 5  . allopurinol (ZYLOPRIM) 300 MG tablet TAKE 1 TABLET BY MOUTH EVERY DAY 30 tablet 3  . aspirin EC 81 MG tablet Take 81 mg by mouth daily. Reported on 04/10/2016    . atorvastatin (LIPITOR) 80 MG tablet TAKE 1 TABLET BY MOUTH AT BEDTIME 30 tablet 1  . carvedilol (COREG) 25 MG tablet Take 1 tablet (25 mg total) by mouth 2 (two) times daily. 180 tablet 3  . cetirizine (ZYRTEC) 10 MG tablet Take 1 tablet by mouth daily.  5  . clopidogrel (PLAVIX) 75 MG tablet Take 75 mg by mouth daily.    . empagliflozin (JARDIANCE) 25 MG TABS tablet Take 25 mg by mouth daily. 30 tablet 3  . isosorbide mononitrate (IMDUR) 30 MG 24 hr tablet Take 1 tablet (30 mg total) by mouth daily. 30 tablet 0  . KOMBIGLYZE XR 2.04-999 MG TB24 TAKE 2 TABLETS BY MOUTH DAILY 60 tablet 3  . lisinopril (PRINIVIL,ZESTRIL) 5 MG tablet TAKE 1 TABLET BY MOUTH EVERY DAY 30 tablet 5  . meloxicam (MOBIC) 15 MG tablet TAKE 1 TABLET(15 MG) BY  MOUTH DAILY 30 tablet 0  . nitroGLYCERIN (NITROSTAT) 0.4 MG SL tablet Place 1 tablet (0.4 mg total) under the tongue every 5 (five) minutes as needed for chest pain. 30 tablet 1  . pantoprazole (PROTONIX) 40 MG tablet Take 1 tablet (40 mg total) by mouth 2 (two) times daily. 60 tablet 5   No current facility-administered medications on file prior to visit.      Patient Care Team: Reubin MilanBerglund, Petra Dumler H, MD as PCP - General (Internal Medicine) Midge MiniumWohl, Darren, MD as Consulting Physician  (Gastroenterology)      Objective:   Vitals: BP 112/68 (BP Location: Right Arm, Patient Position: Sitting, Cuff Size: Normal)   Pulse 87   Ht 5\' 9"  (1.753 m)   Wt 197 lb (89.4 kg)   SpO2 96%   BMI 29.09 kg/m   Physical Exam  Constitutional: He is oriented to person, place, and time. He appears well-developed and well-nourished.  HENT:  Head: Normocephalic.  Right Ear: Tympanic membrane, external ear and ear canal normal. Decreased hearing is noted.  Left Ear: Tympanic membrane, external ear and ear canal normal. Decreased hearing is noted.  Nose: Nose normal.  Mouth/Throat: Uvula is midline and oropharynx is clear and moist.  Eyes: Conjunctivae and EOM are normal. Pupils are equal, round, and reactive to light.  Neck: Normal range of motion. Neck supple. Carotid bruit is not present. No thyromegaly present.  Cardiovascular: Normal rate, regular rhythm, normal heart sounds and intact distal pulses.   Pulses:      Dorsalis pedis pulses are 1+ on the right side, and 0 on the left side.       Posterior tibial pulses are 1+ on the right side, and 1+ on the left side.  Pulmonary/Chest: Effort normal and breath sounds normal. He has no wheezes. He has no rales. Right breast exhibits no mass. Left breast exhibits no mass.  Abdominal: Soft. Normal appearance and bowel sounds are normal. There is no hepatosplenomegaly. There is no tenderness.  Musculoskeletal: Normal range of motion. He exhibits no edema or tenderness.  Lymphadenopathy:    He has no cervical adenopathy.  Neurological: He is alert and oriented to person, place, and time. He has normal reflexes.  Skin: Skin is warm, dry and intact.  Psychiatric: He has a normal mood and affect. His speech is normal and behavior is normal. Judgment and thought content normal. Cognition and memory are normal.  Nursing note and vitals reviewed.   Activities of Daily Living In your present state of health, do you have any difficulty performing  the following activities: 06/10/2017 02/03/2017  Hearing? N Y  Vision? N N  Difficulty concentrating or making decisions? N N  Walking or climbing stairs? N N  Dressing or bathing? N N  Doing errands, shopping? N N  Preparing Food and eating ? N -  Using the Toilet? N -  In the past six months, have you accidently leaked urine? N -  Do you have problems with loss of bowel control? N -  Managing your Medications? N -  Managing your Finances? N -  Housekeeping or managing your Housekeeping? N -  Some recent data might be hidden    Fall Risk Assessment Fall Risk  06/10/2017 08/12/2016 07/27/2015  Falls in the past year? No No No    Depression Screen PHQ 2/9 Scores 06/10/2017 08/12/2016 07/27/2015  PHQ - 2 Score 0 0 0   6CIT Screen 06/10/2017  What Year? 0 points  What month? 0  points  What time? 0 points  Count back from 20 0 points  Months in reverse 0 points  Repeat phrase 4 points  Total Score 4      Medicare Annual Wellness Visit Summary:  Reviewed patient's Family Medical History Reviewed and updated list of patient's medical providers Assessment of cognitive impairment was done Assessed patient's functional ability Established a written schedule for health screening services Health Risk Assessent Completed and Reviewed  Exercise Activities and Dietary recommendations Goals    None      Immunization History  Administered Date(s) Administered  . Influenza,inj,Quad PF,36+ Mos 01/03/2016  . Pneumococcal Conjugate-13 04/10/2016  . Tdap 08/16/2013    Health Maintenance  Topic Date Due  . Hepatitis C Screening  Aug 22, 1951  . OPHTHALMOLOGY EXAM  04/27/1960  . FOOT EXAM  04/10/2017  . PNA vac Low Risk Adult (2 of 2 - PPSV23) 04/10/2017  . COLONOSCOPY  12/10/2017 (Originally 06/13/2014)  . INFLUENZA VACCINE  07/30/2017  . HEMOGLOBIN A1C  09/04/2017  . TETANUS/TDAP  08/17/2023    Discussed health benefits of physical activity, and encouraged him to engage in  regular exercise appropriate for his age and condition.    ------------------------------------------------------------------------------------------------------------  Assessment & Plan:     1. Medicare annual wellness visit, subsequent Measures satisfied - POCT urinalysis dipstick  2. Coronary artery disease involving native coronary artery of native heart with angina pectoris (HCC) Stable, followed by cardiology Continue Plavix and aspirin  3. Essential hypertension controlled - CBC with Differential/Platelet - Comprehensive metabolic panel  4. Uncontrolled type 2 diabetes mellitus without complication, without long-term current use of insulin (HCC) Needs to schedule eye exam - Hemoglobin A1c - Microalbumin / creatinine urine ratio  5. Mixed hyperlipidemia On statin therapy - Lipid panel  6. Hx of colonic polyps Repeat EGD and colonoscopy in December  7. Need for hepatitis C screening test - Hepatitis C antibody  8. Prostate cancer screening DRE deferred but PSA done due to nocturia - PSA  9. Gastroesophageal reflux disease, esophagitis presence not specified Continue Pantoprazole  10. Need for pneumococcal vaccination - Pneumococcal polysaccharide vaccine 23-valent greater than or equal to 2yo subcutaneous/IM  11. PVD (peripheral vascular disease) (HCC) - Ambulatory referral to Vascular Surgery   Bari Edward, MD [facility]  06/10/2017

## 2017-06-11 ENCOUNTER — Other Ambulatory Visit: Payer: Self-pay | Admitting: Internal Medicine

## 2017-06-11 LAB — COMPREHENSIVE METABOLIC PANEL
ALBUMIN: 4.5 g/dL (ref 3.6–4.8)
ALK PHOS: 130 IU/L — AB (ref 39–117)
ALT: 34 IU/L (ref 0–44)
AST: 28 IU/L (ref 0–40)
Albumin/Globulin Ratio: 1.5 (ref 1.2–2.2)
BILIRUBIN TOTAL: 0.8 mg/dL (ref 0.0–1.2)
BUN / CREAT RATIO: 15 (ref 10–24)
BUN: 17 mg/dL (ref 8–27)
CO2: 21 mmol/L (ref 20–29)
CREATININE: 1.16 mg/dL (ref 0.76–1.27)
Calcium: 9.5 mg/dL (ref 8.6–10.2)
Chloride: 100 mmol/L (ref 96–106)
GFR calc Af Amer: 75 mL/min/{1.73_m2} (ref >59)
GFR calc non Af Amer: 65 mL/min/{1.73_m2} (ref >59)
Globulin, Total: 3.1 g/dL (ref 1.5–4.5)
Glucose: 200 mg/dL — ABNORMAL HIGH (ref 65–99)
Potassium: 4.5 mmol/L (ref 3.5–5.2)
SODIUM: 138 mmol/L (ref 134–144)
Total Protein: 7.6 g/dL (ref 6.0–8.5)

## 2017-06-11 LAB — CBC WITH DIFFERENTIAL/PLATELET
BASOS: 0 %
Basophils Absolute: 0 10*3/uL (ref 0.0–0.2)
EOS (ABSOLUTE): 0.1 10*3/uL (ref 0.0–0.4)
EOS: 2 %
HEMATOCRIT: 49.6 % (ref 37.5–51.0)
HEMOGLOBIN: 16.7 g/dL (ref 13.0–17.7)
Immature Grans (Abs): 0 10*3/uL (ref 0.0–0.1)
Immature Granulocytes: 0 %
LYMPHS ABS: 1.4 10*3/uL (ref 0.7–3.1)
Lymphs: 17 %
MCH: 31.3 pg (ref 26.6–33.0)
MCHC: 33.7 g/dL (ref 31.5–35.7)
MCV: 93 fL (ref 79–97)
MONOCYTES: 7 %
Monocytes Absolute: 0.6 10*3/uL (ref 0.1–0.9)
NEUTROS ABS: 6.1 10*3/uL (ref 1.4–7.0)
Neutrophils: 74 %
Platelets: 199 10*3/uL (ref 150–379)
RBC: 5.33 x10E6/uL (ref 4.14–5.80)
RDW: 14.2 % (ref 12.3–15.4)
WBC: 8.2 10*3/uL (ref 3.4–10.8)

## 2017-06-11 LAB — LIPID PANEL
CHOL/HDL RATIO: 4 ratio (ref 0.0–5.0)
Cholesterol, Total: 131 mg/dL (ref 100–199)
HDL: 33 mg/dL — ABNORMAL LOW (ref >39)
LDL CALC: 51 mg/dL (ref 0–99)
TRIGLYCERIDES: 237 mg/dL — AB (ref 0–149)
VLDL CHOLESTEROL CAL: 47 mg/dL — AB (ref 5–40)

## 2017-06-11 LAB — HEMOGLOBIN A1C
Est. average glucose Bld gHb Est-mCnc: 212 mg/dL
Hgb A1c MFr Bld: 9 % — ABNORMAL HIGH (ref 4.8–5.6)

## 2017-06-11 LAB — HEPATITIS C ANTIBODY

## 2017-06-11 LAB — PSA: Prostate Specific Ag, Serum: 2.7 ng/mL (ref 0.0–4.0)

## 2017-06-11 LAB — MICROALBUMIN / CREATININE URINE RATIO
Creatinine, Urine: 31.7 mg/dL
Microalb/Creat Ratio: <9.5 mg/g creat (ref 0.0–30.0)
Microalbumin, Urine: <3 ug/mL

## 2017-06-11 MED ORDER — GLIPIZIDE ER 10 MG PO TB24: 10.0000 mg | ORAL_TABLET | Freq: Every day | ORAL | 1 refills | Status: DC

## 2017-06-18 ENCOUNTER — Other Ambulatory Visit: Payer: Self-pay | Admitting: Internal Medicine

## 2017-06-22 ENCOUNTER — Other Ambulatory Visit: Payer: Self-pay | Admitting: Internal Medicine

## 2017-06-22 DIAGNOSIS — E1165 Type 2 diabetes mellitus with hyperglycemia: Principal | ICD-10-CM

## 2017-06-22 DIAGNOSIS — IMO0001 Reserved for inherently not codable concepts without codable children: Secondary | ICD-10-CM

## 2017-06-30 ENCOUNTER — Encounter (INDEPENDENT_AMBULATORY_CARE_PROVIDER_SITE_OTHER): Payer: Self-pay | Admitting: Vascular Surgery

## 2017-06-30 ENCOUNTER — Ambulatory Visit (INDEPENDENT_AMBULATORY_CARE_PROVIDER_SITE_OTHER): Payer: Medicare Other | Admitting: Vascular Surgery

## 2017-06-30 VITALS — BP 133/80 | HR 84 | Resp 15 | Ht 70.0 in | Wt 196.0 lb

## 2017-06-30 DIAGNOSIS — I25119 Atherosclerotic heart disease of native coronary artery with unspecified angina pectoris: Secondary | ICD-10-CM

## 2017-06-30 DIAGNOSIS — M47817 Spondylosis without myelopathy or radiculopathy, lumbosacral region: Secondary | ICD-10-CM

## 2017-06-30 DIAGNOSIS — I739 Peripheral vascular disease, unspecified: Secondary | ICD-10-CM | POA: Diagnosis not present

## 2017-06-30 DIAGNOSIS — E1165 Type 2 diabetes mellitus with hyperglycemia: Secondary | ICD-10-CM

## 2017-06-30 DIAGNOSIS — M5137 Other intervertebral disc degeneration, lumbosacral region: Secondary | ICD-10-CM

## 2017-06-30 DIAGNOSIS — I10 Essential (primary) hypertension: Secondary | ICD-10-CM

## 2017-06-30 DIAGNOSIS — M79605 Pain in left leg: Secondary | ICD-10-CM | POA: Diagnosis not present

## 2017-06-30 DIAGNOSIS — E782 Mixed hyperlipidemia: Secondary | ICD-10-CM | POA: Diagnosis not present

## 2017-06-30 DIAGNOSIS — IMO0001 Reserved for inherently not codable concepts without codable children: Secondary | ICD-10-CM

## 2017-06-30 DIAGNOSIS — M79604 Pain in right leg: Secondary | ICD-10-CM | POA: Diagnosis not present

## 2017-06-30 DIAGNOSIS — E1143 Type 2 diabetes mellitus with diabetic autonomic (poly)neuropathy: Secondary | ICD-10-CM | POA: Diagnosis not present

## 2017-06-30 NOTE — Progress Notes (Signed)
MRN : [id number]  Gary Gutierrez is a 66 y.o. ([birth year]) male who presents with chief complaint of  Chief Complaint  Patient presents with  . New Evaluation    PVD  .  History of Present Illness: The patient is seen for evaluation of painful lower extremities. He states the tingling and burning began about 8 years ago and has progressed with time. He notes that the left foot is more significantly affected than the right.  Patient notes the pain is variable and not always associated with activity.  The pain is somewhat consistent day to day occurring on most days. The patient notes the pain also occurs with standing and routinely seems worse as the day wears on. The pain has been progressive over the past several years. The patient states these symptoms are causing  a profound negative impact on quality of life and daily activities.  He states he was told years ago at the time of his CABG that he had an 80% stenosis of the aorta. Subsequently while living in [place] he underwent extensive celation therapy. At this point he states the last time his aorta was checked and was only a 50% stenosis  The patient denies rest pain or dangling of an extremity off the side of the bed during the night for relief. No open wounds or sores at this time. No history of DVT or phlebitis. No prior peripheral interventions or surgeries.  There is a  history of back problems and DJD of the lumbar and sacral spine.   He has a history of coronary artery disease and his status post both CABG as well as several stents in the coronary system. No amaurosis fugax or TIAs, no history of CVA.  No outpatient prescriptions have been marked as taking for the 06/30/17 encounter (Office Visit) with Gilda Crease, Latina Craver, MD.    Past Medical History:  Diagnosis Date  . Allergy   . Coronary artery disease   . Diabetes mellitus without complication (HCC)   . Hyperlipidemia   . Hypertension     Past Surgical  History:  Procedure Laterality Date  . APPENDECTOMY    . colonscopy  2010   benign polyps  . CORONARY ARTERY BYPASS GRAFT  1995  . CORONARY STENT INTERVENTION N/A 02/03/2017   Procedure: Coronary Stent Intervention;  Surgeon: Alwyn Pea, MD;  Location: [facility] INVASIVE CV LAB;  Service: Cardiovascular;  Laterality: N/A;  . ESOPHAGOGASTRODUODENOSCOPY  2010   normal  . LEFT HEART CATH AND CORONARY ANGIOGRAPHY N/A 02/03/2017   Procedure: Left Heart Cath and Coronary Angiography;  Surgeon: Alwyn Pea, MD;  Location: [facility] INVASIVE CV LAB;  Service: Cardiovascular;  Laterality: N/A;  . PERCUTANEOUS CORONARY STENT INTERVENTION (PCI-S)  2010, 2015   4 stents total    Social History Social History  Substance Use Topics  . Smoking status: Former Games developer  . Smokeless tobacco: Never Used  . Alcohol use No    Family History Family History  Problem Relation Age of Onset  . Diabetes Father   . Diabetes Brother   . Prostate cancer Brother   . Lung cancer Brother   No family history of bleeding/clotting disorders, porphyria or autoimmune disease   Allergies  Allergen Reactions  . Penicillins Other (See Comments)    Other reaction(s): Unknown, pt does not know reactions     REVIEW OF SYSTEMS (Negative unless checked)  Constitutional: [] Weight loss  [] Fever  [] Chills Cardiac: [] Chest pain   [] Chest pressure   []   Palpitations   [] Shortness of breath when laying flat   [] Shortness of breath with exertion. Vascular:  [x] Pain in legs with walking   [x] Pain in legs at rest  [] History of DVT   [] Phlebitis   [] Swelling in legs   [] Varicose veins   [] Non-healing ulcers Pulmonary:   [] Uses home oxygen   [] Productive cough   [] Hemoptysis   [] Wheeze  [] COPD   [] Asthma Neurologic:  [] Dizziness   [] Seizures   [] History of stroke   [] History of TIA  [] Aphasia   [] Vissual changes   [] Weakness or numbness in arm   [] Weakness or numbness in leg Musculoskeletal:   [] Joint swelling   [x] Joint pain    [x] Low back pain Hematologic:  [] Easy bruising  [] Easy bleeding   [] Hypercoagulable state   [] Anemic Gastrointestinal:  [] Diarrhea   [] Vomiting  [] Gastroesophageal reflux/heartburn   [] Difficulty swallowing. Genitourinary:  [] Chronic kidney disease   [] Difficult urination  [] Frequent urination   [] Blood in urine Skin:  [] Rashes   [] Ulcers  Psychological:  [] History of anxiety   []  History of major depression.  Physical Examination  There were no vitals filed for this visit. There is no height or weight on file to calculate BMI. Gen: WD/WN, NAD Head: [place]/AT, No temporalis wasting.  Ear/Nose/Throat: Hearing grossly intact, nares w/o erythema or drainage, poor dentition Eyes: PER, EOMI, sclera nonicteric.  Neck: Supple, no masses.  No bruit or JVD.  Pulmonary:  Good air movement, clear to auscultation bilaterally, no use of accessory muscles.  Cardiac: RRR, normal S1, S2, no Murmurs. Vascular: No carotid bruits,  mild pitting edema noted bilaterally with very minimal venous skin changes Vessel Right Left  Radial Palpable Palpable  Ulnar Palpable Palpable  Brachial Palpable Palpable  Carotid Palpable Palpable  Femoral Palpable Palpable  Popliteal 1+ Palpable 1+ Palpable  PT Trace Palpable Not Palpable  DP Not Palpable Trace Palpable   Gastrointestinal: soft, non-distended. No guarding/no peritoneal signs.  Musculoskeletal: M/S 5/5 throughout.  No deformity or atrophy.  Neurologic: CN 2-12 intact. Pain and light touch intact in extremities.  Symmetrical.  Speech is fluent. Motor exam as listed above. Psychiatric: Judgment intact, Mood & affect appropriate for pt's clinical situation. Dermatologic: Mild venous rashes no ulcers noted.  No changes consistent with cellulitis. Lymph : No Cervical lymphadenopathy, no lichenification or skin changes of chronic lymphedema.  CBC Lab Results  Component Value Date   WBC 8.2 06/10/2017   HGB 16.7 06/10/2017   HCT 49.6 06/10/2017   MCV 93  06/10/2017   PLT 199 06/10/2017    BMET    Component Value Date/Time   NA 138 06/10/2017 0931   NA 142 10/11/2014 0414   K 4.5 06/10/2017 0931   K 4.1 10/11/2014 0414   CL 100 06/10/2017 0931   CL 107 10/11/2014 0414   CO2 21 06/10/2017 0931   CO2 27 10/11/2014 0414   GLUCOSE 200 (H) 06/10/2017 0931   GLUCOSE 215 (H) 02/13/2017 1810   GLUCOSE 114 (H) 10/11/2014 0414   BUN 17 06/10/2017 0931   BUN 16 10/11/2014 0414   CREATININE 1.16 06/10/2017 0931   CREATININE 1.19 10/11/2014 0414   CALCIUM 9.5 06/10/2017 0931   CALCIUM 7.8 (L) 10/11/2014 0414   GFRNONAA 65 06/10/2017 0931   GFRNONAA >60 10/11/2014 0414   GFRAA 75 06/10/2017 0931   GFRAA >60 10/11/2014 0414   CrCl cannot be calculated (Unknown ideal weight.).  COAG Lab Results  Component Value Date   INR 0.93 02/02/2017  Radiology No results found.  Assessment/Plan 1. Pain in both lower extremities  Recommend:  The patient has atypical pain symptoms for pure atherosclerotic disease. However, on physical exam there is evidence of peripheral arterial disease, given the diminished pulses of the legs.  He has a history of aortic stenosis as well as extensive coronary artery disease. However he also has diabetes as well as degenerative lumbar sacral spine issues.  Noninvasive studies including ABI's and aortoiliac ultrasound be obtained and the patient will follow up with me to review these studies.  The patient should continue walking and begin a more formal exercise program. The patient should continue his antiplatelet therapy and aggressive treatment of the lipid abnormalities.  The patient should begin wearing graduated compression socks 15-20 mmHg strength to control edema.    A total of 70 minutes was spent with this patient and greater than 50% was spent in counseling and coordination of care with the patient.  Discussion included the treatment options for vascular disease including indications for surgery  and intervention.  Also discussed is the appropriate timing of treatment.  In addition medical therapy was discussed.  - VAS US AORTA/IVC/ILIACS; Future - VAS [place] ABI WITH/WO TBI; Future  2. PAD (peripheral artery disease) (HCC) See #1  - VAS US AORTA/IVC/ILIACS; Future - VAS [place] ABI WITH/WO TBI; Future  3. DJD (degenerative joint disease), lumbosacral See #1  4. Diabetic autonomic neuropathy associated with type 2 diabetes mellitus (HCC) Continue hypoglycemic medications as already ordered, these medications have been reviewed and there are no changes at this time.  Hgb A1C to be monitored as already arranged by primary service   5. Coronary artery disease involving native coronary artery of native heart with angina pectoris (HCC) Continue cardiac and antihypertensive medications as already ordered and reviewed, no changes at this time.  Continue statin as ordered and reviewed, no changes at this time  Nitrates PRN for chest pain   6. Essential hypertension Continue antihypertensive medications as already ordered, these medications have been reviewed and there are no changes at this time.   7. Mixed hyperlipidemia Continue statin as ordered and reviewed, no changes at this time   8. Uncontrolled type 2 diabetes mellitus without complication, without long-term current use of insulin (HCC) Continue hypoglycemic medications as already ordered, these medications have been reviewed and there are no changes at this time.  Hgb A1C to be monitored as already arranged by primary service    Levora Dredge, MD  06/30/2017 8:30 AM

## 2017-07-01 DIAGNOSIS — M47817 Spondylosis without myelopathy or radiculopathy, lumbosacral region: Secondary | ICD-10-CM | POA: Insufficient documentation

## 2017-07-01 DIAGNOSIS — E1142 Type 2 diabetes mellitus with diabetic polyneuropathy: Secondary | ICD-10-CM | POA: Insufficient documentation

## 2017-07-23 ENCOUNTER — Other Ambulatory Visit: Payer: Self-pay | Admitting: Internal Medicine

## 2017-08-16 ENCOUNTER — Other Ambulatory Visit: Payer: Self-pay | Admitting: Internal Medicine

## 2017-08-18 ENCOUNTER — Encounter (INDEPENDENT_AMBULATORY_CARE_PROVIDER_SITE_OTHER): Payer: Medicare Other

## 2017-08-18 ENCOUNTER — Ambulatory Visit (INDEPENDENT_AMBULATORY_CARE_PROVIDER_SITE_OTHER): Payer: Medicare Other | Admitting: Vascular Surgery

## 2017-09-15 ENCOUNTER — Other Ambulatory Visit: Payer: Self-pay | Admitting: Internal Medicine

## 2017-09-15 DIAGNOSIS — I10 Essential (primary) hypertension: Secondary | ICD-10-CM

## 2017-09-15 DIAGNOSIS — I25119 Atherosclerotic heart disease of native coronary artery with unspecified angina pectoris: Secondary | ICD-10-CM

## 2017-10-10 ENCOUNTER — Encounter: Payer: Self-pay | Admitting: Internal Medicine

## 2017-10-10 ENCOUNTER — Ambulatory Visit (INDEPENDENT_AMBULATORY_CARE_PROVIDER_SITE_OTHER): Payer: Medicare Other | Admitting: Internal Medicine

## 2017-10-10 VITALS — BP 102/60 | HR 85 | Ht 69.0 in | Wt 200.0 lb

## 2017-10-10 DIAGNOSIS — I25119 Atherosclerotic heart disease of native coronary artery with unspecified angina pectoris: Secondary | ICD-10-CM

## 2017-10-10 DIAGNOSIS — I209 Angina pectoris, unspecified: Secondary | ICD-10-CM

## 2017-10-10 DIAGNOSIS — I10 Essential (primary) hypertension: Secondary | ICD-10-CM | POA: Diagnosis not present

## 2017-10-10 DIAGNOSIS — Z23 Encounter for immunization: Secondary | ICD-10-CM

## 2017-10-10 DIAGNOSIS — M539 Dorsopathy, unspecified: Secondary | ICD-10-CM | POA: Diagnosis not present

## 2017-10-10 DIAGNOSIS — E1165 Type 2 diabetes mellitus with hyperglycemia: Secondary | ICD-10-CM | POA: Diagnosis not present

## 2017-10-10 DIAGNOSIS — IMO0001 Reserved for inherently not codable concepts without codable children: Secondary | ICD-10-CM

## 2017-10-10 MED ORDER — IBUPROFEN 800 MG PO TABS: 800.0000 mg | ORAL_TABLET | Freq: Three times a day (TID) | ORAL | 3 refills | Status: DC | PRN

## 2017-10-10 MED ORDER — PANTOPRAZOLE SODIUM 40 MG PO TBEC: 40.0000 mg | DELAYED_RELEASE_TABLET | Freq: Two times a day (BID) | ORAL | 5 refills | Status: DC

## 2017-10-10 MED ORDER — LISINOPRIL 5 MG PO TABS: 5.0000 mg | ORAL_TABLET | Freq: Every day | ORAL | 5 refills | Status: DC

## 2017-10-10 NOTE — Patient Instructions (Signed)
Schedule Diabetic Eye Exam 

## 2017-10-10 NOTE — Progress Notes (Signed)
Date:  10/10/2017   Name:  Gary Gutierrez   DOB:  [birth year]   MRN:  [id number]   Chief Complaint: Hypertension; Diabetes; and Immunizations (High Dose.) Hypertension  This is a chronic problem. The problem is unchanged. The problem is controlled. Pertinent negatives include no chest pain, headaches, palpitations or shortness of breath. Past treatments include beta blockers and ACE inhibitors. The current treatment provides significant improvement.  Diabetes  He presents for his follow-up diabetic visit. He has type 2 diabetes mellitus. His disease course has been improving. Pertinent negatives for hypoglycemia include no headaches or tremors. Pertinent negatives for diabetes include no chest pain, no fatigue, no polydipsia and no polyuria. Current diabetic treatments: glipizide, jardiance, metformin, and tradjenta.  Back Pain  This is a chronic problem. The problem is unchanged. The pain is present in the lumbar spine. The pain is moderate. Pertinent negatives include no abdominal pain, chest pain, dysuria, headaches or numbness. He has tried NSAIDs for the symptoms. The treatment provided moderate relief.  Knee Pain   There was no injury mechanism. The pain is present in the right knee and left knee. Pertinent negatives include no numbness. He has tried NSAIDs for the symptoms.  CAD - patient denies current problems with chest pains shortness of breath or dizziness.His blood pressure at home runs low around 120 systolic. Maintenance active doing yard work and some housework. He is followed by Dr. Juliann Pares and Dr. Gilda Crease.    Review of Systems  Constitutional: Negative for appetite change, fatigue and unexpected weight change.  Eyes: Negative for visual disturbance.  Respiratory: Negative for cough, shortness of breath and wheezing.   Cardiovascular: Negative for chest pain, palpitations and leg swelling.  Gastrointestinal: Negative for abdominal pain and blood in stool.  Endocrine:  Negative for polydipsia and polyuria.  Genitourinary: Negative for dysuria and hematuria.  Musculoskeletal: Positive for arthralgias (back, knees) and back pain.  Skin: Negative for color change and rash.  Neurological: Negative for tremors, numbness and headaches.  Psychiatric/Behavioral: Negative for dysphoric mood and sleep disturbance.    Patient Active Problem List   Diagnosis Date Noted  . Leg pain 07/01/2017  . Diabetic autonomic neuropathy associated with type 2 diabetes mellitus (HCC) 07/01/2017  . DJD (degenerative joint disease), lumbosacral 07/01/2017  . PAD (peripheral artery disease) (HCC) 06/30/2017  . DOE (dyspnea on exertion) 04/10/2017  . Tobacco use disorder, mild, in sustained remission, abuse 04/10/2017  . Uncontrolled type 2 diabetes mellitus without complication, without long-term current use of insulin (HCC) 08/12/2016  . Hx of colonic polyps 04/10/2016  . Hearing loss of both ears 01/03/2016  . Acid reflux 06/09/2015  . Mixed hyperlipidemia 06/09/2015  . Essential hypertension 06/09/2015  . Controlled gout 06/09/2015  . Multilevel degenerative disc disease 06/09/2015  . Subendocardial myocardial infarction (HCC) 05/11/2014  . Coronary artery disease involving native coronary artery of native heart with angina pectoris (HCC) 05/09/2014  . Coronary artery disease involving autologous vein bypass graft 05/09/2014    Prior to Admission medications   Medication Sig Start Date End Date Taking? Authorizing Provider  ACCU-CHEK AVIVA PLUS test strip TEST BLOOD SUGAR DAILY AS DIRECTED 11/03/16  Yes [name], MD  allopurinol (ZYLOPRIM) 300 MG tablet TAKE 1 TABLET BY MOUTH EVERY DAY 06/18/17  Yes [name], MD  aspirin EC 81 MG tablet Take 81 mg by mouth daily. Reported on 04/10/2016   Yes [provider]  atorvastatin (LIPITOR) 80 MG tablet TAKE 1 TABLET BY  MOUTH AT BEDTIME 08/17/17  Yes [name], MD  carvedilol (COREG) 25 MG tablet TAKE  1 TABLET(25 MG) BY MOUTH TWICE DAILY 09/15/17  Yes [name], MD  clopidogrel (PLAVIX) 75 MG tablet Take 75 mg by mouth daily.   Yes [provider]  glipiZIDE (GLUCOTROL XL) 10 MG 24 hr tablet Take 1 tablet (10 mg total) by mouth daily. 06/11/17  Yes [name], MD  isosorbide mononitrate (IMDUR) 30 MG 24 hr tablet Take 1 tablet (30 mg total) by mouth daily. 02/13/17 02/13/18 Yes Jennye Moccasin, MD  JARDIANCE 25 MG TABS tablet TAKE 1 TABLET BY MOUTH DAILY 06/18/17  Yes [name], MD  KOMBIGLYZE XR 2.04-999 MG TB24 TAKE 2 TABLETS BY MOUTH DAILY 06/23/17  Yes [name], MD  lisinopril (PRINIVIL,ZESTRIL) 5 MG tablet TAKE 1 TABLET BY MOUTH EVERY DAY 03/04/17  Yes [name], MD  meloxicam (MOBIC) 15 MG tablet TAKE 1 TABLET(15 MG) BY MOUTH DAILY 01/17/17  Yes [name], MD  nitroGLYCERIN (NITROSTAT) 0.4 MG SL tablet Place 1 tablet (0.4 mg total) under the tongue every 5 (five) minutes as needed for chest pain. 02/04/17  Yes [name], MD  pantoprazole (PROTONIX) 40 MG tablet Take 1 tablet (40 mg total) by mouth 2 (two) times daily. 03/04/17  Yes [name], MD    Allergies  Allergen Reactions  . Penicillins Other (See Comments)    Other reaction(s): Unknown, pt does not know reactions    Past Surgical History:  Procedure Laterality Date  . APPENDECTOMY    . colonscopy  2010   benign polyps  . CORONARY ARTERY BYPASS GRAFT  1995  . CORONARY STENT INTERVENTION N/A 02/03/2017   Procedure: Coronary Stent Intervention;  Surgeon: Alwyn Pea, MD;  Location: [facility];  Service: Cardiovascular;  Laterality: N/A;  . ESOPHAGOGASTRODUODENOSCOPY  2010   normal  . LEFT HEART CATH AND CORONARY ANGIOGRAPHY N/A 02/03/2017   Procedure: Left Heart Cath and Coronary Angiography;  Surgeon: Alwyn Pea, MD;  Location: [facility];  Service: Cardiovascular;  Laterality: N/A;  . PERCUTANEOUS CORONARY STENT INTERVENTION  (PCI-S)  2010, 2015   4 stents total    Social History  Substance Use Topics  . Smoking status: Former Games developer  . Smokeless tobacco: Never Used  . Alcohol use No     Medication list has been reviewed and updated.  PHQ 2/9 Scores 06/10/2017 08/12/2016 07/27/2015  PHQ - 2 Score 0 0 0    Physical Exam  Constitutional: He is oriented to person, place, and time. He appears well-developed. No distress.  HENT:  Head: Normocephalic and atraumatic.  Neck: Normal range of motion. Neck supple. Carotid bruit is not present.  Cardiovascular: Normal rate, regular rhythm and normal heart sounds.   Pulmonary/Chest: Effort normal and breath sounds normal. No respiratory distress. He has no wheezes. He has no rales.  Musculoskeletal: He exhibits no edema.  Neurological: He is alert and oriented to person, place, and time.  Skin: Skin is warm and dry. No rash noted.  Psychiatric: He has a normal mood and affect. His behavior is normal. Thought content normal.  Nursing note and vitals reviewed.   BP (!) 92/58   Pulse 85   Ht  (1.753 m)   Wt 200 lb (90.7 kg)   SpO2 98%   BMI 29.53 kg/m   Assessment and Plan: 1. Uncontrolled type 2 diabetes mellitus without complication, without  long-term current use of insulin (HCC) Continue current therapy - may need to add insulin or GLP-1 Pt encouraged to have diabetic eye exam - Comprehensive metabolic panel - Hemoglobin A1c  2. Essential hypertension controlled - CBC with Differential/Platelet - lisinopril (PRINIVIL,ZESTRIL) 5 MG tablet; Take 1 tablet (5 mg total) by mouth daily.  Dispense: 30 tablet; Refill: 5  3. Coronary artery disease involving native coronary artery of native heart with angina pectoris (HCC) stable  4. Need for influenza vaccination - Flu vaccine HIGH DOSE PF  5. Multilevel degenerative disc disease May take ibuprofen PRN Will monitor renal function - ibuprofen (ADVIL,MOTRIN) 800 MG tablet; Take 1 tablet (800 mg  total) by mouth every 8 (eight) hours as needed.  Dispense: 30 tablet; Refill: 3   Meds ordered this encounter  Medications  . lisinopril (PRINIVIL,ZESTRIL) 5 MG tablet    Sig: Take 1 tablet (5 mg total) by mouth daily.    Dispense:  30 tablet    Refill:  5  . pantoprazole (PROTONIX) 40 MG tablet    Sig: Take 1 tablet (40 mg total) by mouth 2 (two) times daily.    Dispense:  60 tablet    Refill:  5  . ibuprofen (ADVIL,MOTRIN) 800 MG tablet    Sig: Take 1 tablet (800 mg total) by mouth every 8 (eight) hours as needed.    Dispense:  30 tablet    Refill:  3    Partially dictated using Animal nutritionist. Any errors are unintentional.  Bari Edward, MD [facility]  10/10/2017

## 2017-10-12 LAB — CBC WITH DIFFERENTIAL/PLATELET
Basophils Absolute: 0 10*3/uL (ref 0.0–0.2)
Basos: 0 %
EOS (ABSOLUTE): 0.1 10*3/uL (ref 0.0–0.4)
Eos: 1 %
HEMATOCRIT: 48 % (ref 37.5–51.0)
HEMOGLOBIN: 15.9 g/dL (ref 13.0–17.7)
IMMATURE GRANS (ABS): 0 10*3/uL (ref 0.0–0.1)
IMMATURE GRANULOCYTES: 0 %
Lymphocytes Absolute: 1.8 10*3/uL (ref 0.7–3.1)
Lymphs: 21 %
MCH: 31.4 pg (ref 26.6–33.0)
MCHC: 33.1 g/dL (ref 31.5–35.7)
MCV: 95 fL (ref 79–97)
Monocytes Absolute: 0.6 10*3/uL (ref 0.1–0.9)
Monocytes: 7 %
NEUTROS ABS: 5.9 10*3/uL (ref 1.4–7.0)
Neutrophils: 71 %
Platelets: 174 10*3/uL (ref 150–379)
RBC: 5.06 x10E6/uL (ref 4.14–5.80)
RDW: 15 % (ref 12.3–15.4)
WBC: 8.5 10*3/uL (ref 3.4–10.8)

## 2017-10-12 LAB — COMPREHENSIVE METABOLIC PANEL
A/G RATIO: 1.7 (ref 1.2–2.2)
ALT: 20 IU/L (ref 0–44)
AST: 16 IU/L (ref 0–40)
Albumin: 4.4 g/dL (ref 3.6–4.8)
Alkaline Phosphatase: 143 IU/L — ABNORMAL HIGH (ref 39–117)
BUN/Creatinine Ratio: 15 (ref 10–24)
BUN: 18 mg/dL (ref 8–27)
Bilirubin Total: 0.7 mg/dL (ref 0.0–1.2)
CO2: 21 mmol/L (ref 20–29)
CREATININE: 1.18 mg/dL (ref 0.76–1.27)
Calcium: 9.7 mg/dL (ref 8.6–10.2)
Chloride: 103 mmol/L (ref 96–106)
GFR calc non Af Amer: 63 mL/min/{1.73_m2} (ref >59)
GFR, EST AFRICAN AMERICAN: 73 mL/min/{1.73_m2} (ref >59)
GLOBULIN, TOTAL: 2.6 g/dL (ref 1.5–4.5)
Glucose: 173 mg/dL — ABNORMAL HIGH (ref 65–99)
POTASSIUM: 5 mmol/L (ref 3.5–5.2)
SODIUM: 141 mmol/L (ref 134–144)
TOTAL PROTEIN: 7 g/dL (ref 6.0–8.5)

## 2017-10-12 LAB — HEMOGLOBIN A1C
Est. average glucose Bld gHb Est-mCnc: 148 mg/dL
Hgb A1c MFr Bld: 6.8 % — ABNORMAL HIGH (ref 4.8–5.6)

## 2017-10-20 ENCOUNTER — Other Ambulatory Visit: Payer: Self-pay | Admitting: Internal Medicine

## 2017-10-20 DIAGNOSIS — E1165 Type 2 diabetes mellitus with hyperglycemia: Principal | ICD-10-CM

## 2017-10-20 DIAGNOSIS — IMO0001 Reserved for inherently not codable concepts without codable children: Secondary | ICD-10-CM

## 2017-10-20 DIAGNOSIS — M539 Dorsopathy, unspecified: Secondary | ICD-10-CM

## 2017-10-21 ENCOUNTER — Ambulatory Visit: Payer: Medicare Other

## 2017-10-21 ENCOUNTER — Ambulatory Visit
Admission: EM | Admit: 2017-10-21 | Discharge: 2017-10-21 | Disposition: A | Discharge status: Home / Self Care | Payer: Medicare Other | Attending: Family Medicine | Admitting: Family Medicine

## 2017-10-21 DIAGNOSIS — Z791 Long term (current) use of non-steroidal anti-inflammatories (NSAID): Secondary | ICD-10-CM | POA: Diagnosis not present

## 2017-10-21 DIAGNOSIS — R0602 Shortness of breath: Secondary | ICD-10-CM | POA: Insufficient documentation

## 2017-10-21 DIAGNOSIS — E1151 Type 2 diabetes mellitus with diabetic peripheral angiopathy without gangrene: Secondary | ICD-10-CM | POA: Diagnosis not present

## 2017-10-21 DIAGNOSIS — Z955 Presence of coronary angioplasty implant and graft: Secondary | ICD-10-CM | POA: Diagnosis not present

## 2017-10-21 DIAGNOSIS — Z7982 Long term (current) use of aspirin: Secondary | ICD-10-CM | POA: Diagnosis not present

## 2017-10-21 DIAGNOSIS — K219 Gastro-esophageal reflux disease without esophagitis: Secondary | ICD-10-CM | POA: Diagnosis not present

## 2017-10-21 DIAGNOSIS — J441 Chronic obstructive pulmonary disease with (acute) exacerbation: Secondary | ICD-10-CM | POA: Insufficient documentation

## 2017-10-21 DIAGNOSIS — I25119 Atherosclerotic heart disease of native coronary artery with unspecified angina pectoris: Secondary | ICD-10-CM | POA: Diagnosis not present

## 2017-10-21 DIAGNOSIS — M109 Gout, unspecified: Secondary | ICD-10-CM | POA: Diagnosis not present

## 2017-10-21 DIAGNOSIS — E1165 Type 2 diabetes mellitus with hyperglycemia: Secondary | ICD-10-CM | POA: Insufficient documentation

## 2017-10-21 DIAGNOSIS — I10 Essential (primary) hypertension: Secondary | ICD-10-CM | POA: Diagnosis not present

## 2017-10-21 DIAGNOSIS — Z79899 Other long term (current) drug therapy: Secondary | ICD-10-CM | POA: Diagnosis not present

## 2017-10-21 DIAGNOSIS — Z8042 Family history of malignant neoplasm of prostate: Secondary | ICD-10-CM | POA: Insufficient documentation

## 2017-10-21 DIAGNOSIS — Z9889 Other specified postprocedural states: Secondary | ICD-10-CM | POA: Diagnosis not present

## 2017-10-21 DIAGNOSIS — E1143 Type 2 diabetes mellitus with diabetic autonomic (poly)neuropathy: Secondary | ICD-10-CM | POA: Diagnosis not present

## 2017-10-21 DIAGNOSIS — Z87891 Personal history of nicotine dependence: Secondary | ICD-10-CM | POA: Diagnosis not present

## 2017-10-21 DIAGNOSIS — I25810 Atherosclerosis of coronary artery bypass graft(s) without angina pectoris: Secondary | ICD-10-CM | POA: Insufficient documentation

## 2017-10-21 DIAGNOSIS — E782 Mixed hyperlipidemia: Secondary | ICD-10-CM | POA: Diagnosis not present

## 2017-10-21 DIAGNOSIS — Z833 Family history of diabetes mellitus: Secondary | ICD-10-CM | POA: Diagnosis not present

## 2017-10-21 DIAGNOSIS — Z7984 Long term (current) use of oral hypoglycemic drugs: Secondary | ICD-10-CM | POA: Diagnosis not present

## 2017-10-21 DIAGNOSIS — M1990 Unspecified osteoarthritis, unspecified site: Secondary | ICD-10-CM | POA: Insufficient documentation

## 2017-10-21 DIAGNOSIS — Z801 Family history of malignant neoplasm of trachea, bronchus and lung: Secondary | ICD-10-CM | POA: Insufficient documentation

## 2017-10-21 DIAGNOSIS — R05 Cough: Secondary | ICD-10-CM | POA: Diagnosis not present

## 2017-10-21 DIAGNOSIS — Z79891 Long term (current) use of opiate analgesic: Secondary | ICD-10-CM | POA: Insufficient documentation

## 2017-10-21 DIAGNOSIS — R0989 Other specified symptoms and signs involving the circulatory and respiratory systems: Secondary | ICD-10-CM | POA: Insufficient documentation

## 2017-10-21 DIAGNOSIS — Z880 Allergy status to penicillin: Secondary | ICD-10-CM | POA: Insufficient documentation

## 2017-10-21 MED ORDER — ALBUTEROL SULFATE HFA 108 (90 BASE) MCG/ACT IN AERS: 1.0000 | INHALATION_SPRAY | Freq: Four times a day (QID) | RESPIRATORY_TRACT | 0 refills | Status: DC | PRN

## 2017-10-21 MED ORDER — HYDROCOD POLST-CPM POLST ER 10-8 MG/5ML PO SUER: 5.0000 mL | Freq: Two times a day (BID) | ORAL | 0 refills | Status: DC | PRN

## 2017-10-21 NOTE — ED Provider Notes (Signed)
[facility]    CSN: [id number] Arrival date & time: 10/21/17  4098  History   Chief Complaint Chief Complaint  Patient presents with  . Shortness of Breath    HPI  66 year old male with an extensive past medical history including tobacco abuse, type 2 diabetes, coronary artery disease, peripheral artery disease presents with the above complaint.  Patient states he has been sick for the past 3-4 days.  He has had cough and associated chest congestion.  He reports shortness of breath, particularly with exertion.  No associated fevers or chills.  His cough is slightly productive.  Moderate in severity.  He states that he is feeling somewhat improved today.  No medications or interventions tried.  No other associated symptoms.  No other complaints or concerns at this time.  Past Medical History:  Diagnosis Date  . Allergy   . Coronary artery disease   . Diabetes mellitus without complication (HCC)   . Hyperlipidemia   . Hypertension    Patient Active Problem List   Diagnosis Date Noted  . Diabetic autonomic neuropathy associated with type 2 diabetes mellitus (HCC) 07/01/2017  . DJD (degenerative joint disease), lumbosacral 07/01/2017  . PAD (peripheral artery disease) (HCC) 06/30/2017  . DOE (dyspnea on exertion) 04/10/2017  . Tobacco use disorder, mild, in sustained remission, abuse 04/10/2017  . Uncontrolled type 2 diabetes mellitus without complication, without long-term current use of insulin (HCC) 08/12/2016  . Hx of colonic polyps 04/10/2016  . Hearing loss of both ears 01/03/2016  . Acid reflux 06/09/2015  . Mixed hyperlipidemia 06/09/2015  . Essential hypertension 06/09/2015  . Controlled gout 06/09/2015  . Multilevel degenerative disc disease 06/09/2015  . Coronary artery disease involving native coronary artery of native heart with angina pectoris (HCC) 05/09/2014  . Coronary artery disease involving autologous vein bypass graft 05/09/2014   Past  Surgical History:  Procedure Laterality Date  . APPENDECTOMY    . colonscopy  2010   benign polyps  . CORONARY ARTERY BYPASS GRAFT  1995  . CORONARY STENT INTERVENTION N/A 02/03/2017   Procedure: Coronary Stent Intervention;  Surgeon: Alwyn Pea, MD;  Location: [facility];  Service: Cardiovascular;  Laterality: N/A;  . ESOPHAGOGASTRODUODENOSCOPY  2010   normal  . LEFT HEART CATH AND CORONARY ANGIOGRAPHY N/A 02/03/2017   Procedure: Left Heart Cath and Coronary Angiography;  Surgeon: Alwyn Pea, MD;  Location: [facility];  Service: Cardiovascular;  Laterality: N/A;  . PERCUTANEOUS CORONARY STENT INTERVENTION (PCI-S)  2010, 2015   4 stents total    Home Medications    Prior to Admission medications   Medication Sig Start Date End Date Taking? Authorizing Provider  ACCU-CHEK AVIVA PLUS test strip TEST BLOOD SUGAR DAILY AS DIRECTED 11/03/16  Yes [name], MD  allopurinol (ZYLOPRIM) 300 MG tablet TAKE 1 TABLET BY MOUTH EVERY DAY 06/18/17  Yes [name], MD  aspirin EC 81 MG tablet Take 81 mg by mouth daily. Reported on 04/10/2016   Yes [provider]  atorvastatin (LIPITOR) 80 MG tablet TAKE 1 TABLET BY MOUTH AT BEDTIME 08/17/17  Yes [name], MD  carvedilol (COREG) 25 MG tablet TAKE 1 TABLET(25 MG) BY MOUTH TWICE DAILY 09/15/17  Yes [name], MD  clopidogrel (PLAVIX) 75 MG tablet Take 75 mg by mouth daily.   Yes [provider]  glipiZIDE (GLUCOTROL XL) 10 MG 24 hr tablet Take 1 tablet (10 mg total) by mouth daily. 06/11/17  Yes Reubin MilanBerglund, [name], MD  ibuprofen (ADVIL,MOTRIN) 800 MG tablet Take 1 tablet (800 mg total) by mouth every 8 (eight) hours as needed. 10/10/17  Yes Reubin MilanBerglund, [name], MD  isosorbide mononitrate (IMDUR) 30 MG 24 hr tablet Take 1 tablet (30 mg total) by mouth daily. 02/13/17 02/13/18 Yes [name], [name], MD  JARDIANCE 25 MG TABS tablet TAKE 1 TABLET BY MOUTH DAILY 06/18/17  Yes Reubin MilanBerglund, [name],  MD  KOMBIGLYZE XR 2.04-999 MG TB24 TAKE 2 TABLETS BY MOUTH DAILY 10/20/17  Yes Reubin MilanBerglund, [name], MD  lisinopril (PRINIVIL,ZESTRIL) 5 MG tablet Take 1 tablet (5 mg total) by mouth daily. 10/10/17  Yes Reubin MilanBerglund, [name], MD  meloxicam (MOBIC) 15 MG tablet TAKE 1 TABLET(15 MG) BY MOUTH DAILY 10/20/17  Yes Reubin MilanBerglund, [name], MD  nitroGLYCERIN (NITROSTAT) 0.4 MG SL tablet Place 1 tablet (0.4 mg total) under the tongue every 5 (five) minutes as needed for chest pain. 02/04/17  Yes Katha HammingKonidena, [name], MD  pantoprazole (PROTONIX) 40 MG tablet Take 1 tablet (40 mg total) by mouth 2 (two) times daily. 10/10/17  Yes Reubin MilanBerglund, [name], MD  albuterol (PROVENTIL HFA;VENTOLIN HFA) 108 (90 Base) MCG/ACT inhaler Inhale 1-2 puffs into the lungs every 6 (six) hours as needed for wheezing or shortness of breath. 10/21/17   Tommie Samsook, Karan Ramnauth G, DO  chlorpheniramine-HYDROcodone (TUSSIONEX PENNKINETIC ER) 10-8 MG/5ML SUER Take 5 mLs by mouth every 12 (twelve) hours as needed. 10/21/17   Tommie Samsook, Gurkirat Basher G, DO    Family History Family History  Problem Relation Age of Onset  . Diabetes Father   . Diabetes Brother   . Prostate cancer Brother   . Lung cancer Brother     Social History Social History  Substance Use Topics  . Smoking status: Former Games developermoker  . Smokeless tobacco: Never Used  . Alcohol use No     Allergies   Penicillins   Review of Systems Review of Systems  Constitutional: Negative for fever.  Respiratory: Positive for cough and shortness of breath.        Chest congestion.  Skin:       Bruising.   All other systems reviewed and are negative.  Physical Exam Triage Vital Signs ED Triage Vitals [10/21/17 0932]  Enc Vitals Group     BP (!) 144/85     Pulse Rate 90     Resp 18     Temp 98.7 F (37.1 C)     Temp src      SpO2 96 %     Weight 200 lb (90.7 kg)     Height 5\' 10"  (1.778 m)     Head Circumference      Peak Flow      Pain Score 0     Pain Loc      Pain Edu?      Excl. in GC?      Updated Vital Signs BP (!) 144/85 (BP Location: Left Arm)   Pulse 90   Temp 98.7 F (37.1 C)   Resp 18   Ht 5\' 10"  (1.778 m)   Wt 200 lb (90.7 kg)   SpO2 96%   BMI 28.70 kg/m   Physical Exam  Constitutional: He is oriented to person, place, and time. He appears well-developed. No distress.  HENT:  Head: Normocephalic and atraumatic.  Nose: Nose normal.  Mouth/Throat: Oropharynx is clear and moist.  Eyes: Conjunctivae are normal. Right eye exhibits no discharge. Left eye exhibits no discharge. No scleral  icterus.  Neck: Neck supple.  Cardiovascular: Normal rate and regular rhythm.   No murmur heard. Pulmonary/Chest: Effort normal and breath sounds normal. No respiratory distress. He has no wheezes. He has no rales.  Abdominal: Soft. Bowel sounds are normal. He exhibits no distension. There is no tenderness. There is no rebound and no guarding.  Musculoskeletal: Normal range of motion. He exhibits no edema.  Lymphadenopathy:    He has no cervical adenopathy.  Neurological: He is alert and oriented to person, place, and time.  Normal tone. No focal deficits.  Skin: Skin is warm. No rash noted.  Psychiatric: He has a normal mood and affect. His behavior is normal.  Vitals reviewed.  UC Treatments / Results  Labs (all labs ordered are listed, but only abnormal results are displayed) Labs Reviewed - No data to display  EKG  EKG Interpretation None       Radiology Dg Chest 2 View  Result Date: 10/21/2017 CLINICAL DATA:  Shortness of breath, cough, wheezing, chest congestion EXAM: CHEST  2 VIEW COMPARISON:  02/13/2017 FINDINGS: Prior CABG. There is hyperinflation of the lungs compatible with COPD. Chronic interstitial prominence throughout the lungs. Scarring in the lung bases. No effusions or confluent airspace opacities. Heart is normal size. IMPRESSION: COPD/chronic changes.  No active disease. Electronically Signed   By: Charlett Nose M.D.   On: 10/21/2017 10:22     Procedures Procedures (including critical care time)  Medications Ordered in UC Medications - No data to display   Initial Impression / Assessment and Plan / UC Course  I have reviewed the triage vital signs and the nursing notes.  Pertinent labs & imaging results that were available during my care of the patient were reviewed by me and considered in my medical decision making (see chart for details).    66 year old male presents with what appears to be a mild COPD exacerbation.  This is a new diagnosis.  X-ray findings consistent with COPD.  In the setting of known tobacco abuse, and his current clinical picture he has COPD.  Treating for a mild COPD exacerbation with albuterol and Tussionex.  Discussed use of steroids but he elected to not proceed given his diabetes.  Final Clinical Impressions(s) / UC Diagnoses   Final diagnoses:  COPD exacerbation (HCC)    New Prescriptions Discharge Medication List as of 10/21/2017 10:33 AM    START taking these medications   Details  albuterol (PROVENTIL HFA;VENTOLIN HFA) 108 (90 Base) MCG/ACT inhaler Inhale 1-2 puffs into the lungs every 6 (six) hours as needed for wheezing or shortness of breath., Starting Tue 10/21/2017, Normal    chlorpheniramine-HYDROcodone (TUSSIONEX PENNKINETIC ER) 10-8 MG/5ML SUER Take 5 mLs by mouth every 12 (twelve) hours as needed., Starting Tue 10/21/2017, Print       Controlled Substance Prescriptions [place] Controlled Substance Registry consulted? Not Applicable   [name], DO 10/21/17 1048

## 2017-10-21 NOTE — ED Triage Notes (Signed)
Patient complains of cough, short of breath-worse with walking, low grade fever x 3-4 day worsening last night.

## 2017-10-21 NOTE — Discharge Instructions (Signed)
Albuterol as directed.  Cough medication as needed.   Take care  Dr. Adriana Simasook

## 2017-10-24 ENCOUNTER — Ambulatory Visit (INDEPENDENT_AMBULATORY_CARE_PROVIDER_SITE_OTHER): Payer: Medicare Other | Admitting: Internal Medicine

## 2017-10-24 ENCOUNTER — Encounter: Payer: Self-pay | Admitting: Internal Medicine

## 2017-10-24 VITALS — BP 142/86 | HR 86 | Ht 70.0 in | Wt 200.4 lb

## 2017-10-24 DIAGNOSIS — R0602 Shortness of breath: Secondary | ICD-10-CM | POA: Diagnosis not present

## 2017-10-24 DIAGNOSIS — I25119 Atherosclerotic heart disease of native coronary artery with unspecified angina pectoris: Secondary | ICD-10-CM

## 2017-10-24 DIAGNOSIS — F17201 Nicotine dependence, unspecified, in remission: Secondary | ICD-10-CM

## 2017-10-24 DIAGNOSIS — Z720 Tobacco use: Secondary | ICD-10-CM

## 2017-10-24 DIAGNOSIS — R918 Other nonspecific abnormal finding of lung field: Secondary | ICD-10-CM

## 2017-10-24 MED ORDER — DOXYCYCLINE HYCLATE 100 MG PO TABS: 100.0000 mg | ORAL_TABLET | Freq: Two times a day (BID) | ORAL | 0 refills | Status: DC

## 2017-10-24 MED ORDER — PREDNISONE 10 MG PO TABS: ORAL_TABLET | ORAL | 0 refills | Status: DC

## 2017-10-24 NOTE — Progress Notes (Signed)
Date:  10/24/2017   Name:  Gary Gutierrez   DOB:  08-25-1951   MRN:  [id number]   Chief Complaint: COPD (Xray showed COPD in [facility]. Last four days he cannot lay down or lean he feels like he cannot breathe. Sometimes walking short distance he is losing his breathe. Also, losing sleep and can't breath well and waking up gasping.  )  Shortness of breath and cough -patient was seen several days ago in urgent care.  Chest x-ray was suggestive of COPD and interstitial changes.  He was prescribed albuterol inhaler and Tessalon Perles.  Previous x-ray done in February of this year showed no similar chronic changes.  He continues to be short of breath especially with lying down.  He has a dry nonproductive cough.  His shortness of breath is relieved temporarily by the use of albuterol inhaler.  He denies sputum production fever or chills.  No headache dizziness or syncope.  All of this started several days after he went to a [employer] race and inhaled a lot of of gasoline and other fumes. He quit smoking cigarettes in 1996.   Review of Systems  Constitutional: Negative for chills, fatigue and fever.  Respiratory: Positive for cough, chest tightness, shortness of breath and wheezing.   Cardiovascular: Negative for chest pain and palpitations.  Gastrointestinal: Negative for constipation and nausea.  Neurological: Negative for dizziness, syncope and headaches.  Psychiatric/Behavioral: Positive for sleep disturbance.    Patient Active Problem List   Diagnosis Date Noted  . Diabetic autonomic neuropathy associated with type 2 diabetes mellitus (HCC) 07/01/2017  . DJD (degenerative joint disease), lumbosacral 07/01/2017  . PAD (peripheral artery disease) (HCC) 06/30/2017  . DOE (dyspnea on exertion) 04/10/2017  . Tobacco use disorder, mild, in sustained remission, abuse 04/10/2017  . Uncontrolled type 2 diabetes mellitus without complication, without long-term current use of insulin (HCC)  08/12/2016  . Hx of colonic polyps 04/10/2016  . Hearing loss of both ears 01/03/2016  . Acid reflux 06/09/2015  . Mixed hyperlipidemia 06/09/2015  . Essential hypertension 06/09/2015  . Controlled gout 06/09/2015  . Multilevel degenerative disc disease 06/09/2015  . Coronary artery disease involving native coronary artery of native heart with angina pectoris (HCC) 05/09/2014  . Coronary artery disease involving autologous vein bypass graft 05/09/2014    Prior to Admission medications   Medication Sig Start Date End Date Taking? Authorizing Provider  ACCU-CHEK AVIVA PLUS test strip TEST BLOOD SUGAR DAILY AS DIRECTED 11/03/16  Yes Reubin MilanBerglund, Selyna Klahn H, MD  albuterol (PROVENTIL HFA;VENTOLIN HFA) 108 (90 Base) MCG/ACT inhaler Inhale 1-2 puffs into the lungs every 6 (six) hours as needed for wheezing or shortness of breath. 10/21/17  Yes Cook, Jayce G, DO  allopurinol (ZYLOPRIM) 300 MG tablet TAKE 1 TABLET BY MOUTH EVERY DAY 06/18/17  Yes Reubin MilanBerglund, Inice Sanluis H, MD  aspirin EC 81 MG tablet Take 81 mg by mouth daily. Reported on 04/10/2016   Yes [provider]  atorvastatin (LIPITOR) 80 MG tablet TAKE 1 TABLET BY MOUTH AT BEDTIME 08/17/17  Yes Reubin MilanBerglund, Batsheva Stevick H, MD  carvedilol (COREG) 25 MG tablet TAKE 1 TABLET(25 MG) BY MOUTH TWICE DAILY 09/15/17  Yes Reubin MilanBerglund, Brinton Brandel H, MD  clopidogrel (PLAVIX) 75 MG tablet Take 75 mg by mouth daily.   Yes [provider]  glipiZIDE (GLUCOTROL XL) 10 MG 24 hr tablet Take 1 tablet (10 mg total) by mouth daily. 06/11/17  Yes Reubin MilanBerglund, Nary Sneed H, MD  ibuprofen (ADVIL,MOTRIN) 800 MG tablet  Take 1 tablet (800 mg total) by mouth every 8 (eight) hours as needed. 10/10/17  Yes [name], MD  isosorbide mononitrate (IMDUR) 30 MG 24 hr tablet Take 1 tablet (30 mg total) by mouth daily. 02/13/17 02/13/18 Yes Jennye Moccasin, MD  JARDIANCE 25 MG TABS tablet TAKE 1 TABLET BY MOUTH DAILY 06/18/17  Yes [name], MD  KOMBIGLYZE XR 2.04-999 MG TB24 TAKE 2  TABLETS BY MOUTH DAILY 10/20/17  Yes [name], MD  lisinopril (PRINIVIL,ZESTRIL) 5 MG tablet Take 1 tablet (5 mg total) by mouth daily. 10/10/17  Yes [name], MD  meloxicam (MOBIC) 15 MG tablet TAKE 1 TABLET(15 MG) BY MOUTH DAILY 10/20/17  Yes [name], MD  nitroGLYCERIN (NITROSTAT) 0.4 MG SL tablet Place 1 tablet (0.4 mg total) under the tongue every 5 (five) minutes as needed for chest pain. 02/04/17  Yes [name], MD  pantoprazole (PROTONIX) 40 MG tablet Take 1 tablet (40 mg total) by mouth 2 (two) times daily. 10/10/17  Yes [name], MD    Allergies  Allergen Reactions  . Penicillins Other (See Comments)    Other reaction(s): Unknown, pt does not know reactions    Past Surgical History:  Procedure Laterality Date  . APPENDECTOMY    . colonscopy  2010   benign polyps  . CORONARY ARTERY BYPASS GRAFT  1995  . CORONARY STENT INTERVENTION N/A 02/03/2017   Procedure: Coronary Stent Intervention;  Surgeon: Alwyn Pea, MD;  Location: [facility] INVASIVE CV LAB;  Service: Cardiovascular;  Laterality: N/A;  . ESOPHAGOGASTRODUODENOSCOPY  2010   normal  . LEFT HEART CATH AND CORONARY ANGIOGRAPHY N/A 02/03/2017   Procedure: Left Heart Cath and Coronary Angiography;  Surgeon: Alwyn Pea, MD;  Location: [facility] INVASIVE CV LAB;  Service: Cardiovascular;  Laterality: N/A;  . PERCUTANEOUS CORONARY STENT INTERVENTION (PCI-S)  2010, 2015   4 stents total    Social History  Substance Use Topics  . Smoking status: Former Games developer  . Smokeless tobacco: Never Used  . Alcohol use No     Medication list has been reviewed and updated.  PHQ 2/9 Scores 06/10/2017 08/12/2016 07/27/2015  PHQ - 2 Score 0 0 0    Physical Exam  Constitutional: He is oriented to person, place, and time. He appears well-developed. No distress.  HENT:  Head: Normocephalic and atraumatic.  Neck: No JVD present. Carotid bruit is not present.  Cardiovascular: Normal rate,  regular rhythm and normal heart sounds.   Pulmonary/Chest: Effort normal. No respiratory distress. He has decreased breath sounds (course breath sounds). He has no wheezes. He has no rales.  Musculoskeletal: Normal range of motion.  Neurological: He is alert and oriented to person, place, and time.  Skin: Skin is warm and dry. No rash noted.  Psychiatric: He has a normal mood and affect. His behavior is normal. Thought content normal.  Nursing note and vitals reviewed.   BP (!) 142/86   Pulse 86   Ht 5\' 10"  (1.778 m)   Wt 200 lb 6.4 oz (90.9 kg)   SpO2 94%   BMI 28.75 kg/m   Assessment and Plan: 1. Shortness of breath Suspect atypical infection will treat and have him return in 1 month for follow-up chest x-ray - predniSONE (DELTASONE) 10 MG tablet; Take 6 on day 1, 5 on day 2, 4 on day 3, 3 on day 4, 2 on day 5 and 1 on day 1 then stop.  Dispense: 21  tablet; Refill: 0  2. Abnormality of lung on CXR If no improvement return in the near future - doxycycline (VIBRA-TABS) 100 MG tablet; Take 1 tablet (100 mg total) by mouth 2 (two) times daily.  Dispense: 20 tablet; Refill: 0 - predniSONE (DELTASONE) 10 MG tablet; Take 6 on day 1, 5 on day 2, 4 on day 3, 3 on day 4, 2 on day 5 and 1 on day 1 then stop.  Dispense: 21 tablet; Refill: 0  3. Tobacco use disorder, mild, in sustained remission, abuse    Meds ordered this encounter  Medications  . doxycycline (VIBRA-TABS) 100 MG tablet    Sig: Take 1 tablet (100 mg total) by mouth 2 (two) times daily.    Dispense:  20 tablet    Refill:  0  . predniSONE (DELTASONE) 10 MG tablet    Sig: Take 6 on day 1, 5 on day 2, 4 on day 3, 3 on day 4, 2 on day 5 and 1 on day 1 then stop.    Dispense:  21 tablet    Refill:  0    Partially dictated using Animal nutritionist. Any errors are unintentional.  Bari Edward, MD [facility]  10/24/2017

## 2017-10-24 NOTE — Patient Instructions (Signed)
Use Albuterol inhaler 2 puffs every 6 hours

## 2017-10-28 ENCOUNTER — Ambulatory Visit: Payer: Medicare Other | Admitting: Internal Medicine

## 2017-10-29 ENCOUNTER — Observation Stay
Admission: EM | Admit: 2017-10-29 | Discharge: 2017-10-30 | Disposition: A | Discharge status: Home / Self Care | Payer: Medicare Other | Attending: Internal Medicine | Admitting: Internal Medicine

## 2017-10-29 ENCOUNTER — Other Ambulatory Visit: Payer: Self-pay | Admitting: Internal Medicine

## 2017-10-29 ENCOUNTER — Emergency Department: Payer: Medicare Other

## 2017-10-29 ENCOUNTER — Encounter: Payer: Self-pay | Admitting: Emergency Medicine

## 2017-10-29 ENCOUNTER — Inpatient Hospital Stay
Admit: 2017-10-29 | Discharge: 2017-10-29 | Disposition: A | Discharge status: Home / Self Care | Payer: Medicare Other | Attending: Internal Medicine | Admitting: Internal Medicine

## 2017-10-29 DIAGNOSIS — E785 Hyperlipidemia, unspecified: Secondary | ICD-10-CM | POA: Diagnosis not present

## 2017-10-29 DIAGNOSIS — I2510 Atherosclerotic heart disease of native coronary artery without angina pectoris: Secondary | ICD-10-CM | POA: Insufficient documentation

## 2017-10-29 DIAGNOSIS — Z7902 Long term (current) use of antithrombotics/antiplatelets: Secondary | ICD-10-CM | POA: Insufficient documentation

## 2017-10-29 DIAGNOSIS — J9601 Acute respiratory failure with hypoxia: Secondary | ICD-10-CM

## 2017-10-29 DIAGNOSIS — J810 Acute pulmonary edema: Secondary | ICD-10-CM

## 2017-10-29 DIAGNOSIS — J449 Chronic obstructive pulmonary disease, unspecified: Secondary | ICD-10-CM | POA: Insufficient documentation

## 2017-10-29 DIAGNOSIS — I25810 Atherosclerosis of coronary artery bypass graft(s) without angina pectoris: Secondary | ICD-10-CM | POA: Diagnosis not present

## 2017-10-29 DIAGNOSIS — Z87891 Personal history of nicotine dependence: Secondary | ICD-10-CM | POA: Insufficient documentation

## 2017-10-29 DIAGNOSIS — Z951 Presence of aortocoronary bypass graft: Secondary | ICD-10-CM | POA: Diagnosis not present

## 2017-10-29 DIAGNOSIS — I5023 Acute on chronic systolic (congestive) heart failure: Secondary | ICD-10-CM | POA: Diagnosis not present

## 2017-10-29 DIAGNOSIS — Z79899 Other long term (current) drug therapy: Secondary | ICD-10-CM | POA: Insufficient documentation

## 2017-10-29 DIAGNOSIS — I5033 Acute on chronic diastolic (congestive) heart failure: Secondary | ICD-10-CM | POA: Diagnosis not present

## 2017-10-29 DIAGNOSIS — I5022 Chronic systolic (congestive) heart failure: Secondary | ICD-10-CM | POA: Diagnosis not present

## 2017-10-29 DIAGNOSIS — E119 Type 2 diabetes mellitus without complications: Secondary | ICD-10-CM | POA: Insufficient documentation

## 2017-10-29 DIAGNOSIS — I110 Hypertensive heart disease with heart failure: Principal | ICD-10-CM | POA: Insufficient documentation

## 2017-10-29 DIAGNOSIS — I509 Heart failure, unspecified: Secondary | ICD-10-CM | POA: Diagnosis not present

## 2017-10-29 DIAGNOSIS — I161 Hypertensive emergency: Secondary | ICD-10-CM | POA: Diagnosis not present

## 2017-10-29 DIAGNOSIS — E876 Hypokalemia: Secondary | ICD-10-CM | POA: Diagnosis not present

## 2017-10-29 DIAGNOSIS — Z7984 Long term (current) use of oral hypoglycemic drugs: Secondary | ICD-10-CM | POA: Insufficient documentation

## 2017-10-29 DIAGNOSIS — Z880 Allergy status to penicillin: Secondary | ICD-10-CM | POA: Diagnosis not present

## 2017-10-29 DIAGNOSIS — I10 Essential (primary) hypertension: Secondary | ICD-10-CM | POA: Diagnosis not present

## 2017-10-29 DIAGNOSIS — R0603 Acute respiratory distress: Secondary | ICD-10-CM | POA: Diagnosis not present

## 2017-10-29 DIAGNOSIS — J9600 Acute respiratory failure, unspecified whether with hypoxia or hypercapnia: Secondary | ICD-10-CM

## 2017-10-29 HISTORY — DX: Heart failure, unspecified: I50.9

## 2017-10-29 LAB — COMPREHENSIVE METABOLIC PANEL
ALBUMIN: 3.8 g/dL (ref 3.5–5.0)
ALT: 53 U/L (ref 17–63)
ANION GAP: 9 (ref 5–15)
AST: 49 U/L — ABNORMAL HIGH (ref 15–41)
Alkaline Phosphatase: 109 U/L (ref 38–126)
BILIRUBIN TOTAL: 0.9 mg/dL (ref 0.3–1.2)
BUN: 19 mg/dL (ref 6–20)
CALCIUM: 9 mg/dL (ref 8.9–10.3)
CO2: 26 mmol/L (ref 22–32)
Chloride: 104 mmol/L (ref 101–111)
Creatinine, Ser: 1.2 mg/dL (ref 0.61–1.24)
GFR calc non Af Amer: >60 mL/min (ref >60)
GLUCOSE: 205 mg/dL — AB (ref 65–99)
POTASSIUM: 3.4 mmol/L — AB (ref 3.5–5.1)
SODIUM: 139 mmol/L (ref 135–145)
TOTAL PROTEIN: 7.4 g/dL (ref 6.5–8.1)

## 2017-10-29 LAB — CBC
HEMATOCRIT: 52.7 % — AB (ref 40.0–52.0)
HEMOGLOBIN: 17.1 g/dL (ref 13.0–18.0)
MCH: 30.9 pg (ref 26.0–34.0)
MCHC: 32.4 g/dL (ref 32.0–36.0)
MCV: 95.2 fL (ref 80.0–100.0)
Platelets: 372 10*3/uL (ref 150–440)
RBC: 5.54 MIL/uL (ref 4.40–5.90)
RDW: 15.2 % — AB (ref 11.5–14.5)
WBC: 16 10*3/uL — AB (ref 3.8–10.6)

## 2017-10-29 LAB — TROPONIN I
TROPONIN I: 0.44 ng/mL — AB (ref <0.03)
Troponin I: 0.03 ng/mL (ref <0.03)
Troponin I: 1.01 ng/mL (ref <0.03)
Troponin I: 0.81 ng/mL (ref <0.03)

## 2017-10-29 LAB — ECHOCARDIOGRAM COMPLETE
Height: 70 in
WEIGHTICAEL: 3200 [oz_av]

## 2017-10-29 LAB — GLUCOSE, CAPILLARY
GLUCOSE-CAPILLARY: 216 mg/dL — AB (ref 65–99)
GLUCOSE-CAPILLARY: 248 mg/dL — AB (ref 65–99)
GLUCOSE-CAPILLARY: 95 mg/dL (ref 65–99)

## 2017-10-29 LAB — MRSA PCR SCREENING: MRSA BY PCR: NEGATIVE

## 2017-10-29 MED ORDER — POTASSIUM CHLORIDE CRYS ER 20 MEQ PO TBCR
20.0000 meq | EXTENDED_RELEASE_TABLET | Freq: Every day | ORAL | Status: DC
  Administered 2017-10-29 – 2017-10-30 (×2): 20 meq via ORAL
  Filled 2017-10-29 (×2): qty 1

## 2017-10-29 MED ORDER — HEPARIN SODIUM (PORCINE) 5000 UNIT/ML IJ SOLN
5000.0000 [IU] | Freq: Three times a day (TID) | INTRAMUSCULAR | Status: DC
  Administered 2017-10-29 – 2017-10-30 (×4): 5000 [IU] via SUBCUTANEOUS
  Filled 2017-10-29 (×4): qty 1

## 2017-10-29 MED ORDER — CLOPIDOGREL BISULFATE 75 MG PO TABS
75.0000 mg | ORAL_TABLET | Freq: Every day | ORAL | Status: DC
  Administered 2017-10-29 – 2017-10-30 (×2): 75 mg via ORAL
  Filled 2017-10-29 (×2): qty 1

## 2017-10-29 MED ORDER — CARVEDILOL 12.5 MG PO TABS
12.5000 mg | ORAL_TABLET | Freq: Two times a day (BID) | ORAL | Status: DC
  Administered 2017-10-29 – 2017-10-30 (×3): 12.5 mg via ORAL
  Filled 2017-10-29 (×3): qty 1

## 2017-10-29 MED ORDER — IPRATROPIUM-ALBUTEROL 0.5-2.5 (3) MG/3ML IN SOLN
3.0000 mL | Freq: Once | RESPIRATORY_TRACT | Status: AC
  Administered 2017-10-29: 3 mL via RESPIRATORY_TRACT

## 2017-10-29 MED ORDER — INSULIN ASPART 100 UNIT/ML ~~LOC~~ SOLN: 0.0000 [IU] | Freq: Every day | SUBCUTANEOUS | Status: DC

## 2017-10-29 MED ORDER — FUROSEMIDE 10 MG/ML IJ SOLN
INTRAMUSCULAR | Status: AC
  Administered 2017-10-29: 40 mg via INTRAVENOUS
  Filled 2017-10-29: qty 4

## 2017-10-29 MED ORDER — FUROSEMIDE 10 MG/ML IJ SOLN
40.0000 mg | Freq: Once | INTRAMUSCULAR | Status: AC
  Administered 2017-10-29: 40 mg via INTRAVENOUS

## 2017-10-29 MED ORDER — IPRATROPIUM-ALBUTEROL 0.5-2.5 (3) MG/3ML IN SOLN
RESPIRATORY_TRACT | Status: AC
  Administered 2017-10-29: 3 mL via RESPIRATORY_TRACT
  Filled 2017-10-29: qty 9

## 2017-10-29 MED ORDER — SAXAGLIPTIN-METFORMIN ER 2.5-1000 MG PO TB24: 2.0000 | ORAL_TABLET | Freq: Every day | ORAL | Status: DC

## 2017-10-29 MED ORDER — LORAZEPAM 2 MG/ML IJ SOLN
1.0000 mg | Freq: Once | INTRAMUSCULAR | Status: AC
  Administered 2017-10-29: 1 mg via INTRAVENOUS

## 2017-10-29 MED ORDER — LISINOPRIL 5 MG PO TABS
5.0000 mg | ORAL_TABLET | Freq: Every day | ORAL | Status: DC
Start: 2017-10-29 — End: 2017-10-30
  Administered 2017-10-29 – 2017-10-30 (×2): 5 mg via ORAL
  Filled 2017-10-29 (×2): qty 1

## 2017-10-29 MED ORDER — ALBUTEROL SULFATE HFA 108 (90 BASE) MCG/ACT IN AERS: 1.0000 | INHALATION_SPRAY | Freq: Four times a day (QID) | RESPIRATORY_TRACT | Status: DC | PRN

## 2017-10-29 MED ORDER — LORAZEPAM 2 MG/ML IJ SOLN
INTRAMUSCULAR | Status: AC
  Administered 2017-10-29: 1 mg via INTRAVENOUS
  Filled 2017-10-29: qty 1

## 2017-10-29 MED ORDER — INSULIN ASPART 100 UNIT/ML ~~LOC~~ SOLN
0.0000 [IU] | Freq: Three times a day (TID) | SUBCUTANEOUS | Status: DC
  Administered 2017-10-29 – 2017-10-30 (×2): 5 [IU] via SUBCUTANEOUS
  Filled 2017-10-29 (×2): qty 1

## 2017-10-29 MED ORDER — ONDANSETRON HCL 4 MG/2ML IJ SOLN: 4.0000 mg | Freq: Four times a day (QID) | INTRAMUSCULAR | Status: DC | PRN

## 2017-10-29 MED ORDER — NITROGLYCERIN 2 % TD OINT
TOPICAL_OINTMENT | TRANSDERMAL | Status: AC
  Administered 2017-10-29: 05:00:00
  Filled 2017-10-29: qty 1

## 2017-10-29 MED ORDER — ISOSORBIDE MONONITRATE ER 30 MG PO TB24
30.0000 mg | ORAL_TABLET | Freq: Every day | ORAL | Status: DC
  Administered 2017-10-29 – 2017-10-30 (×2): 30 mg via ORAL
  Filled 2017-10-29 (×2): qty 1

## 2017-10-29 MED ORDER — ALBUTEROL SULFATE (2.5 MG/3ML) 0.083% IN NEBU: 2.5000 mg | INHALATION_SOLUTION | Freq: Four times a day (QID) | RESPIRATORY_TRACT | Status: DC | PRN

## 2017-10-29 MED ORDER — ACETAMINOPHEN 325 MG PO TABS: 650.0000 mg | ORAL_TABLET | ORAL | Status: DC | PRN

## 2017-10-29 MED ORDER — PANTOPRAZOLE SODIUM 40 MG PO TBEC
40.0000 mg | DELAYED_RELEASE_TABLET | Freq: Two times a day (BID) | ORAL | Status: DC
  Administered 2017-10-29 – 2017-10-30 (×3): 40 mg via ORAL
  Filled 2017-10-29 (×3): qty 1

## 2017-10-29 MED ORDER — ALBUTEROL SULFATE (2.5 MG/3ML) 0.083% IN NEBU
INHALATION_SOLUTION | RESPIRATORY_TRACT | Status: AC
  Filled 2017-10-29: qty 9

## 2017-10-29 MED ORDER — DOXYCYCLINE HYCLATE 100 MG PO TABS: 100.0000 mg | ORAL_TABLET | Freq: Two times a day (BID) | ORAL | Status: DC

## 2017-10-29 MED ORDER — SODIUM CHLORIDE 0.9% FLUSH
3.0000 mL | Freq: Two times a day (BID) | INTRAVENOUS | Status: DC
  Administered 2017-10-29 – 2017-10-30 (×3): 3 mL via INTRAVENOUS

## 2017-10-29 MED ORDER — ASPIRIN EC 81 MG PO TBEC
81.0000 mg | DELAYED_RELEASE_TABLET | Freq: Every day | ORAL | Status: DC
  Administered 2017-10-29 – 2017-10-30 (×2): 81 mg via ORAL
  Filled 2017-10-29 (×2): qty 1

## 2017-10-29 MED ORDER — ALLOPURINOL 300 MG PO TABS
300.0000 mg | ORAL_TABLET | Freq: Every day | ORAL | Status: DC
  Administered 2017-10-29 – 2017-10-30 (×2): 300 mg via ORAL
  Filled 2017-10-29 (×2): qty 1

## 2017-10-29 MED ORDER — ASPIRIN EC 81 MG PO TBEC: 81.0000 mg | DELAYED_RELEASE_TABLET | Freq: Every day | ORAL | Status: DC

## 2017-10-29 MED ORDER — METFORMIN HCL ER 750 MG PO TB24
2000.0000 mg | ORAL_TABLET | Freq: Every day | ORAL | Status: DC
  Administered 2017-10-29: 10:00:00 2000 mg via ORAL
  Filled 2017-10-29: qty 1

## 2017-10-29 MED ORDER — FUROSEMIDE 10 MG/ML IJ SOLN
60.0000 mg | Freq: Two times a day (BID) | INTRAMUSCULAR | Status: DC
  Administered 2017-10-29 – 2017-10-30 (×3): 60 mg via INTRAVENOUS
  Filled 2017-10-29 (×3): qty 6

## 2017-10-29 MED ORDER — SODIUM CHLORIDE 0.9% FLUSH
3.0000 mL | INTRAVENOUS | Status: DC | PRN
  Administered 2017-10-29: 3 mL via INTRAVENOUS
  Filled 2017-10-29: qty 3

## 2017-10-29 MED ORDER — MELOXICAM 7.5 MG PO TABS
15.0000 mg | ORAL_TABLET | Freq: Every day | ORAL | Status: DC
  Administered 2017-10-29 – 2017-10-30 (×2): 15 mg via ORAL
  Filled 2017-10-29 (×2): qty 2

## 2017-10-29 MED ORDER — POTASSIUM CHLORIDE CRYS ER 20 MEQ PO TBCR
40.0000 meq | EXTENDED_RELEASE_TABLET | Freq: Two times a day (BID) | ORAL | Status: AC
  Administered 2017-10-29 – 2017-10-30 (×2): 40 meq via ORAL
  Filled 2017-10-29 (×2): qty 2

## 2017-10-29 MED ORDER — GLIPIZIDE ER 10 MG PO TB24
10.0000 mg | ORAL_TABLET | Freq: Every day | ORAL | Status: DC
  Administered 2017-10-29 – 2017-10-30 (×2): 10 mg via ORAL
  Filled 2017-10-29 (×2): qty 1

## 2017-10-29 MED ORDER — INSULIN GLARGINE 100 UNIT/ML ~~LOC~~ SOLN
10.0000 [IU] | Freq: Every day | SUBCUTANEOUS | Status: DC
  Filled 2017-10-29 (×2): qty 0.1

## 2017-10-29 MED ORDER — NITROGLYCERIN 0.4 MG SL SUBL: 0.4000 mg | SUBLINGUAL_TABLET | SUBLINGUAL | Status: DC | PRN

## 2017-10-29 MED ORDER — IPRATROPIUM-ALBUTEROL 0.5-2.5 (3) MG/3ML IN SOLN
3.0000 mL | Freq: Four times a day (QID) | RESPIRATORY_TRACT | Status: DC
  Administered 2017-10-29 (×2): 3 mL via RESPIRATORY_TRACT
  Filled 2017-10-29 (×2): qty 3

## 2017-10-29 MED ORDER — ATORVASTATIN CALCIUM 80 MG PO TABS
80.0000 mg | ORAL_TABLET | Freq: Every day | ORAL | Status: DC
  Administered 2017-10-29: 80 mg via ORAL
  Filled 2017-10-29: qty 1
  Filled 2017-10-29: qty 4
  Filled 2017-10-29: qty 1

## 2017-10-29 MED ORDER — LINAGLIPTIN 5 MG PO TABS
5.0000 mg | ORAL_TABLET | Freq: Every day | ORAL | Status: DC
  Administered 2017-10-29: 5 mg via ORAL
  Filled 2017-10-29: qty 1

## 2017-10-29 MED ORDER — SODIUM CHLORIDE 0.9 % IV SOLN: 250.0000 mL | INTRAVENOUS | Status: DC | PRN

## 2017-10-29 NOTE — Progress Notes (Addendum)
Inpatient Diabetes Program Recommendations  AACE/ADA: New Consensus Statement on Inpatient Glycemic Control (2015)  Target Ranges:  Prepandial:   less than 140 mg/dL      Peak postprandial:   less than 180 mg/dL (1-2 hours)      Critically ill patients:  140 - 180 mg/dL   Lab Results  Component Value Date   GLUCAP 248 (H) 10/29/2017   HGBA1C 6.8 (H) 10/10/2017    Review of Glycemic Control  Results for Gary Gutierrez, Gary Gutierrez (MRN [id number]) as of 10/29/2017 12:53  Ref. Range 10/29/2017 08:38 10/29/2017 12:31  Glucose-Capillary Latest Ref Range: 65 - 99 mg/dL 132216 (H) 440248 (H)    Diabetes history: Type 2 Outpatient Diabetes medications: Glipizide 10mg  q day, Kombiglyze 2.5-1000mg  2 tabs per day, Jardience 25mg  qday Current orders for Inpatient glycemic control: Glipizide 10mg  q day, Metformin 2000mg  qam, Novolog 0-15units tid, Novolog 0-5 units qhs, Tradgenta 5mg  qday,   Inpatient Diabetes Program Recommendations:   May want to consider holding oral diabetes agents while in hospital and use insulin to control blood sugars.  Consider adding Lantus 10 units qhs starting tonight.  Susette RacerJulie Gracelin Weisberg, RN, BA, MHA, CDE Diabetes Coordinator Inpatient Glycemic Control Team [phone] (Team Pager) [phone] [facility] Office) 10/29/2017 12:59 PM

## 2017-10-29 NOTE — ED Provider Notes (Signed)
[facility] Emergency Department Provider Note    First MD Initiated Contact with Patient 10/29/17 0505     (approximate)  I have reviewed the triage vital signs and the nursing notes.  level V caveat: Acute respiratory distress HISTORY  Chief Complaint Respiratory Distress    HPI Gary Gutierrez is a 66 y.o. male With below list of chronic medical conditions including coronary artery bypass graft, congestive heart failure presents to the emergency department with respiratory distress with acute worsening tonight. Patient tripod position with a respiratory rate exceeding 60 initial oxygen saturation 79 heart rate 165 with an oxygen saturation of 79% on room airpatient's blood pressure 207/148. Patient only able to speak in 2 word phrases.patient admits to recent respiratory difficulty for which she was prescribed prednisone.   Past Medical History:  Diagnosis Date  . Allergy   . Coronary artery disease   . Diabetes mellitus without complication (HCC)   . Hyperlipidemia   . Hypertension     Patient Active Problem List   Diagnosis Date Noted  . CHF (congestive heart failure) (HCC) 10/29/2017  . Diabetic autonomic neuropathy associated with type 2 diabetes mellitus (HCC) 07/01/2017  . DJD (degenerative joint disease), lumbosacral 07/01/2017  . PAD (peripheral artery disease) (HCC) 06/30/2017  . DOE (dyspnea on exertion) 04/10/2017  . Tobacco use disorder, mild, in sustained remission, abuse 04/10/2017  . Uncontrolled type 2 diabetes mellitus without complication, without long-term current use of insulin (HCC) 08/12/2016  . Hx of colonic polyps 04/10/2016  . Hearing loss of both ears 01/03/2016  . Acid reflux 06/09/2015  . Mixed hyperlipidemia 06/09/2015  . Essential hypertension 06/09/2015  . Controlled gout 06/09/2015  . Multilevel degenerative disc disease 06/09/2015  . Coronary artery disease involving native coronary artery of native heart  with angina pectoris (HCC) 05/09/2014  . Coronary artery disease involving autologous vein bypass graft 05/09/2014    Past Surgical History:  Procedure Laterality Date  . APPENDECTOMY    . colonscopy  2010   benign polyps  . CORONARY ARTERY BYPASS GRAFT  1995  . CORONARY STENT INTERVENTION N/A 02/03/2017   Procedure: Coronary Stent Intervention;  Surgeon: Alwyn Pea, MD;  Location: [facility] INVASIVE CV LAB;  Service: Cardiovascular;  Laterality: N/A;  . ESOPHAGOGASTRODUODENOSCOPY  2010   normal  . LEFT HEART CATH AND CORONARY ANGIOGRAPHY N/A 02/03/2017   Procedure: Left Heart Cath and Coronary Angiography;  Surgeon: Alwyn Pea, MD;  Location: [facility] INVASIVE CV LAB;  Service: Cardiovascular;  Laterality: N/A;  . PERCUTANEOUS CORONARY STENT INTERVENTION (PCI-S)  2010, 2015   4 stents total    Prior to Admission medications   Medication Sig Start Date End Date Taking? Authorizing Provider  ACCU-CHEK AVIVA PLUS test strip TEST BLOOD SUGAR DAILY AS DIRECTED 11/03/16   [name], MD  albuterol (PROVENTIL HFA;VENTOLIN HFA) 108 (90 Base) MCG/ACT inhaler Inhale 1-2 puffs into the lungs every 6 (six) hours as needed for wheezing or shortness of breath. 10/21/17   [name], DO  allopurinol (ZYLOPRIM) 300 MG tablet TAKE 1 TABLET BY MOUTH EVERY DAY 06/18/17   [name], MD  aspirin EC 81 MG tablet Take 81 mg by mouth daily. Reported on 04/10/2016    [provider]  atorvastatin (LIPITOR) 80 MG tablet TAKE 1 TABLET BY MOUTH AT BEDTIME 08/17/17   [name], MD  carvedilol (COREG) 25 MG tablet TAKE 1 TABLET(25 MG) BY MOUTH TWICE DAILY 09/15/17   [name],  Nyoka CowdenLaura H, MD  clopidogrel (PLAVIX) 75 MG tablet Take 75 mg by mouth daily.    [provider]  doxycycline (VIBRA-TABS) 100 MG tablet Take 1 tablet (100 mg total) by mouth 2 (two) times daily. 10/24/17   Reubin MilanBerglund, [name], MD  glipiZIDE (GLUCOTROL XL) 10 MG 24 hr tablet Take 1 tablet (10 mg total) by  mouth daily. 06/11/17   Reubin MilanBerglund, [name], MD  ibuprofen (ADVIL,MOTRIN) 800 MG tablet Take 1 tablet (800 mg total) by mouth every 8 (eight) hours as needed. 10/10/17   Reubin MilanBerglund, [name], MD  isosorbide mononitrate (IMDUR) 30 MG 24 hr tablet Take 1 tablet (30 mg total) by mouth daily. 02/13/17 02/13/18  [name], [name], MD  JARDIANCE 25 MG TABS tablet TAKE 1 TABLET BY MOUTH DAILY 06/18/17   Reubin MilanBerglund, [name], MD  KOMBIGLYZE XR 2.04-999 MG TB24 TAKE 2 TABLETS BY MOUTH DAILY 10/20/17   Reubin MilanBerglund, [name], MD  lisinopril (PRINIVIL,ZESTRIL) 5 MG tablet Take 1 tablet (5 mg total) by mouth daily. 10/10/17   Reubin MilanBerglund, [name], MD  meloxicam (MOBIC) 15 MG tablet TAKE 1 TABLET(15 MG) BY MOUTH DAILY 10/20/17   Reubin MilanBerglund, [name], MD  nitroGLYCERIN (NITROSTAT) 0.4 MG SL tablet Place 1 tablet (0.4 mg total) under the tongue every 5 (five) minutes as needed for chest pain. 02/04/17   Katha HammingKonidena, [name], MD  pantoprazole (PROTONIX) 40 MG tablet Take 1 tablet (40 mg total) by mouth 2 (two) times daily. 10/10/17   Reubin MilanBerglund, [name], MD  predniSONE (DELTASONE) 10 MG tablet Take 6 on day 1, 5 on day 2, 4 on day 3, 3 on day 4, 2 on day 5 and 1 on day 1 then stop. 10/24/17   Reubin MilanBerglund, [name], MD    Allergies Penicillins  Family History  Problem Relation Age of Onset  . Diabetes Father   . Diabetes Brother   . Prostate cancer Brother   . Lung cancer Brother     Social History Social History  Substance Use Topics  . Smoking status: Former Smoker    Types: Cigarettes    Quit date: 12/30/1994  . Smokeless tobacco: Never Used  . Alcohol use No    Review of Systems Constitutional: No fever/chills Eyes: No visual changes. ENT: No sore throat. Cardiovascular: Denies chest pain. Respiratory:positive for shortness of breath. Gastrointestinal: No abdominal pain.  No nausea, no vomiting.  No diarrhea.  No constipation. Genitourinary: Negative for dysuria. Musculoskeletal: Negative for neck pain.  Negative for back  pain. Integumentary: Negative for rash. Neurological: Negative for headaches, focal weakness or numbness.   ____________________________________________   PHYSICAL EXAM:  VITAL SIGNS: ED Triage Vitals  Enc Vitals Group     BP 10/29/17 0515 (!) 207/168     Pulse Rate 10/29/17 0515 (!) 158     Resp 10/29/17 0515 (!) 60     Temp --      Temp src --      SpO2 10/29/17 0515 96 %     Weight 10/29/17 0534 90.7 kg (200 lb)     Height 10/29/17 0534 1.778 m (5\' 10" )     Head Circumference --      Peak Flow --      Pain Score --      Pain Loc --      Pain Edu? --      Excl. in GC? --     Constitutional: Alert and oriented. apparent respiratory distress.diaphoretic Eyes: Conjunctivae are normal. Head: Atraumatic.  Nose: No congestion/rhinnorhea. Mouth/Throat: Mucous membranes are moist.  Oropharynx non-erythematous. Neck: No stridor.   Cardiovascular: tachycardia, regular rhythm. Good peripheral circulation. Grossly normal heart sounds. Respiratory: tachypnea, positive accessory respiratory muscle use, diffuse rales/rhonchi Gastrointestinal: Soft and nontender. No distention.   Musculoskeletal: No lower extremity tenderness nor edema. No gross deformities of extremities. Neurologic:  speaking to work phrases. No gross focal neurologic deficits are appreciated.  Skin:  Skin is warm, dry and intact. No rash noted. Psychiatric: very anxious affect Speech and behavior are normal.  ____________________________________________   LABS (all labs ordered are listed, but only abnormal results are displayed)  Labs Reviewed  CBC - Abnormal; Notable for the following:       Result Value   WBC 16.0 (*)    HCT 52.7 (*)    RDW 15.2 (*)    All other components within normal limits  COMPREHENSIVE METABOLIC PANEL - Abnormal; Notable for the following:    Potassium 3.4 (*)    Glucose, Bld 205 (*)    AST 49 (*)    All other components within normal limits  TROPONIN I - Abnormal; Notable for  the following:    Troponin I 0.03 (*)    All other components within normal limits   ____________________________________________  EKG  ED ECG REPORT I, [place] Sylvanna Burggraf, the attending physician, personally viewed and interpreted this ECG.   Date: 10/29/2017  EKG Time: 5:25 AM  Rate: 144  Rhythm: sinus tachycardia  Axis: right axis deviation  Intervals:normal  ST&T Change: none  ____________________________________________  RADIOLOGY I, [place] Dewayne Shorter, personally viewed and evaluated these images (plain radiographs) as part of my medical decision making, as well as reviewing the written report by the radiologist.  Dg Chest Portable 1 View  Result Date: 10/29/2017 CLINICAL DATA:  Respiratory distress.  Shortness breath. EXAM: PORTABLE CHEST 1 VIEW COMPARISON:  10/21/2017 FINDINGS: Post median sternotomy. Interval cardiac enlargement. Development of diffuse interstitial and peribronchial thickening. Possible right pleural effusion. No pneumothorax or confluent airspace disease. IMPRESSION: Increase cardiomegaly with moderate pulmonary edema and possible right pleural effusion, consistent with CHF. Electronically Signed   By: Rubye Oaks M.D.   On: 10/29/2017 05:40    ____________________________________________   PROCEDURES  Critical Care performed: CRITICAL CARE Performed by: Darci Current   Total critical care time: 60 minutes  Critical care time was exclusive of separately billable procedures and treating other patients.  Critical care was necessary to treat or prevent imminent or life-threatening deterioration.  Critical care was time spent personally by me on the following activities: development of treatment plan with patient and/or surrogate as well as nursing, discussions with consultants, evaluation of patient's response to treatment, examination of patient, obtaining history from patient or surrogate, ordering and performing treatments and interventions,  ordering and review of laboratory studies, ordering and review of radiographic studies, pulse oximetry and re-evaluation of patient's condition.   Procedures   ____________________________________________   INITIAL IMPRESSION / ASSESSMENT AND PLAN / ED COURSE  As part of my medical decision making, I reviewed the following data within the electronic MEDICAL RECORD [id number] year old male presenting with above stated history of physical exam concerning for impending respiratory failure most likely secondary to hypertensive emergency induced CHF exacerbation. As such  1 inch Nitropaste applied to the patient, BiPAP applied, Lasix 40 mg IV given. Patient received 2 DuoNeb treatment via BiPAP.following these interventions patient respiratory rate improved as well as work of breathing. Patient's current vital signs heart rate 112%  122/83 respiratory rate 16 with an oxygen saturation of 90% on BiPAP. Patient discussed with Dr. Tobi Bastos for hospital admission for further evaluation and management ____________________________________________  FINAL CLINICAL IMPRESSION(S) / ED DIAGNOSES  Final diagnoses:  Acute respiratory failure with hypoxia (HCC)  Acute on chronic congestive heart failure, unspecified heart failure type [facility])  Hypertensive emergency     MEDICATIONS GIVEN DURING THIS VISIT:  Medications  albuterol (PROVENTIL) (2.5 MG/3ML) 0.083% nebulizer solution (  Not Given 10/29/17 0529)  nitroGLYCERIN (NITROGLYN) 2 % ointment (  Given 10/29/17 0523)  furosemide (LASIX) injection 40 mg (40 mg Intravenous Given 10/29/17 0524)  LORazepam (ATIVAN) injection 1 mg (1 mg Intravenous Given 10/29/17 0524)  ipratropium-albuterol (DUONEB) 0.5-2.5 (3) MG/3ML nebulizer solution 3 mL (3 mLs Nebulization Given 10/29/17 0523)  ipratropium-albuterol (DUONEB) 0.5-2.5 (3) MG/3ML nebulizer solution 3 mL (3 mLs Nebulization Given 10/29/17 0523)  ipratropium-albuterol (DUONEB) 0.5-2.5 (3) MG/3ML nebulizer solution  3 mL (3 mLs Nebulization Given 10/29/17 0523)     NEW OUTPATIENT MEDICATIONS STARTED DURING THIS VISIT:  New Prescriptions   No medications on file    Modified Medications   No medications on file    Discontinued Medications   No medications on file     Note:  This document was prepared using Dragon voice recognition software and may include unintentional dictation errors.    Darci Current, MD 10/29/17 [phone]

## 2017-10-29 NOTE — ED Notes (Signed)
Pt assisted to stand up beside the stretcher and use the urinal.

## 2017-10-29 NOTE — ED Notes (Signed)
RT at bedside to set up bipap.

## 2017-10-29 NOTE — Consult Note (Signed)
[facility] Cardiology Consultation Note  Patient ID: Gary Gutierrez, MRN: [id number], DOB/AGE: [birth year] 66 y.o. Admit date: 10/29/2017   Date of Consult: 10/29/2017 Primary Physician: [name], MD Primary Cardiologist: [facility]  Chief Complaint:  Chief Complaint  Patient presents with  . Respiratory Distress   Reason for Consult: heart failure heart failure  HPI: 66 y.o. male with known coronary artery disease status post remote coronary artery bypass grafting with the episode of non-ST elevation myocardial infarction and PCI and stent placement of the graft in February 2018 for which the patient at that time had significant LV systolic dysfunction with ejection fraction of 25-30%. The patient also has hypertension hyperlipidemia on appropriate medication management at the time for which he has had no symptoms until recently having progressive shortness of breath weakness and fatigue PND and orthopnea. There has been some cough and congestion for which the patient has received antibiotics and inhalers although currently is not improved. When seen in the emergency room after an acute episode of severe shortness of breath patient had an EKG showing sinus tachycardia otherwise normal EKG and an elevated troponin of 0.44 possibly consistent with hypoxia and demand ischemia at this time. The patient was placed on intravenous Lasix and had significant improvements of shortness of breath and hypoxia. He does have the significant LV dysfunction and may have had acute on chronic systolic dysfunction heart failure. Currently the patient does have some sinus tachycardia with for which he may need increased dosages of carvedilol and hypertension with increased doses of ACE inhibitor. He has been on isosorbide for which has helped and has not had any apparent significant chest pain or pressure with this incident  Past Medical History:  Diagnosis Date  . Allergy   . CHF (congestive heart  failure) (HCC)   . Coronary artery disease   . Diabetes mellitus without complication (HCC)   . Hyperlipidemia   . Hypertension       Surgical History:  Past Surgical History:  Procedure Laterality Date  . APPENDECTOMY    . colonscopy  2010   benign polyps  . CORONARY ARTERY BYPASS GRAFT  1995  . CORONARY STENT INTERVENTION N/A 02/03/2017   Procedure: Coronary Stent Intervention;  Surgeon: Alwyn Pea, MD;  Location: [facility] INVASIVE CV LAB;  Service: Cardiovascular;  Laterality: N/A;  . ESOPHAGOGASTRODUODENOSCOPY  2010   normal  . LEFT HEART CATH AND CORONARY ANGIOGRAPHY N/A 02/03/2017   Procedure: Left Heart Cath and Coronary Angiography;  Surgeon: Alwyn Pea, MD;  Location: [facility] INVASIVE CV LAB;  Service: Cardiovascular;  Laterality: N/A;  . PERCUTANEOUS CORONARY STENT INTERVENTION (PCI-S)  2010, 2015   4 stents total     Home Meds: Prior to Admission medications   Medication Sig Start Date End Date Taking? Authorizing Provider  albuterol (PROVENTIL HFA;VENTOLIN HFA) 108 (90 Base) MCG/ACT inhaler Inhale 1-2 puffs into the lungs every 6 (six) hours as needed for wheezing or shortness of breath. 10/21/17  Yes Cook, Jayce G, DO  allopurinol (ZYLOPRIM) 300 MG tablet TAKE 1 TABLET BY MOUTH EVERY DAY 06/18/17  Yes [name], MD  aspirin EC 81 MG tablet Take 81 mg by mouth daily. Reported on 04/10/2016   Yes [provider]  atorvastatin (LIPITOR) 80 MG tablet TAKE 1 TABLET BY MOUTH AT BEDTIME 08/17/17  Yes [name], MD  carvedilol (COREG) 25 MG tablet TAKE 1 TABLET(25 MG) BY MOUTH TWICE DAILY 09/15/17  Yes [name], MD  clopidogrel (PLAVIX) 75 MG tablet Take 75 mg by mouth daily.   Yes [provider]  doxycycline (VIBRA-TABS) 100 MG tablet Take 1 tablet (100 mg total) by mouth 2 (two) times daily. 10/24/17  Yes [name], MD  glipiZIDE (GLUCOTROL XL) 10 MG 24 hr tablet Take 1 tablet (10 mg total) by mouth daily. 06/11/17  Yes  [name], MD  ibuprofen (ADVIL,MOTRIN) 800 MG tablet Take 1 tablet (800 mg total) by mouth every 8 (eight) hours as needed. 10/10/17  Yes [name], MD  isosorbide mononitrate (IMDUR) 30 MG 24 hr tablet Take 1 tablet (30 mg total) by mouth daily. 02/13/17 02/13/18 Yes Jennye Moccasin, MD  JARDIANCE 25 MG TABS tablet TAKE 1 TABLET BY MOUTH DAILY 06/18/17  Yes [name], MD  KOMBIGLYZE XR 2.04-999 MG TB24 TAKE 2 TABLETS BY MOUTH DAILY 10/20/17  Yes [name], MD  lisinopril (PRINIVIL,ZESTRIL) 5 MG tablet Take 1 tablet (5 mg total) by mouth daily. 10/10/17  Yes [name], MD  meloxicam (MOBIC) 15 MG tablet TAKE 1 TABLET(15 MG) BY MOUTH DAILY 10/20/17  Yes [name], MD  nitroGLYCERIN (NITROSTAT) 0.4 MG SL tablet Place 1 tablet (0.4 mg total) under the tongue every 5 (five) minutes as needed for chest pain. 02/04/17  Yes [name], MD  pantoprazole (PROTONIX) 40 MG tablet Take 1 tablet (40 mg total) by mouth 2 (two) times daily. 10/10/17  Yes [name], MD  predniSONE (DELTASONE) 10 MG tablet Take 6 on day 1, 5 on day 2, 4 on day 3, 3 on day 4, 2 on day 5 and 1 on day 1 then stop. 10/24/17  Yes [name], MD  ACCU-CHEK AVIVA PLUS test strip TEST BLOOD SUGAR DAILY AS DIRECTED 11/03/16   [name], MD    Inpatient Medications:  . albuterol      . allopurinol  300 mg Oral Daily  . aspirin EC  81 mg Oral Daily  . atorvastatin  80 mg Oral QHS  . carvedilol  12.5 mg Oral BID WC  . clopidogrel  75 mg Oral Daily  . doxycycline  100 mg Oral BID  . furosemide  60 mg Intravenous Q12H  . glipiZIDE  10 mg Oral Daily  . heparin  5,000 Units Subcutaneous Q8H  . ipratropium-albuterol  3 mL Nebulization Q6H  . isosorbide mononitrate  30 mg Oral Daily  . linagliptin  5 mg Oral Q breakfast   And  . metFORMIN  2,000 mg Oral Q breakfast  . lisinopril  5 mg Oral Daily  . meloxicam  15 mg Oral Daily  . pantoprazole  40 mg Oral BID  .  potassium chloride  20 mEq Oral Daily  . sodium chloride flush  3 mL Intravenous Q12H   . sodium chloride      Allergies:  Allergies  Allergen Reactions  . Penicillins Other (See Comments)    Other reaction(s): Unknown, pt does not know reactions    Social History   Social History  . Marital status: Married    Spouse name: N/A  . Number of children: N/A  . Years of education: N/A   Occupational History  . retired    Social History Main Topics  . Smoking status: Former Smoker    Types: Cigarettes    Quit date: 12/30/1994  . Smokeless tobacco: Never Used  . Alcohol use No  . Drug use: No  . Sexual activity:  Not on file   Other Topics Concern  . Not on file   Social History Narrative  . No narrative on file     Family History  Problem Relation Age of Onset  . Diabetes Father   . Diabetes Brother   . Prostate cancer Brother   . Lung cancer Brother      Review of Systems Positive for shortness of breath PND orthopnea Negative for: General:  chills, fever, night sweats or weight changes.  Cardiovascular: Positive for PND orthopnea negative for syncope dizziness  Dermatological skin lesions rashes Respiratory: Cough congestion Urologic: Frequent urination urination at night and hematuria Abdominal: negative for nausea, vomiting, diarrhea, bright red blood per rectum, melena, or hematemesis Neurologic: negative for visual changes, and/or hearing changes  All other systems reviewed and are otherwise negative except as noted above.  Labs:  Recent Labs  10/29/17 0522 10/29/17 0839  TROPONINI 0.03* 0.44*   Lab Results  Component Value Date   WBC 16.0 (H) 10/29/2017   HGB 17.1 10/29/2017   HCT 52.7 (H) 10/29/2017   MCV 95.2 10/29/2017   PLT 372 10/29/2017    Recent Labs Lab 10/29/17 0522  NA 139  K 3.4*  CL 104  CO2 26  BUN 19  CREATININE 1.20  CALCIUM 9.0  PROT 7.4  BILITOT 0.9  ALKPHOS 109  ALT 53  AST 49*  GLUCOSE 205*   Lab Results   Component Value Date   CHOL 131 06/10/2017   HDL 33 (L) 06/10/2017   LDLCALC 51 06/10/2017   TRIG 237 (H) 06/10/2017   No results found for: DDIMER  Radiology/Studies:  Dg Chest 2 View  Result Date: 10/21/2017 CLINICAL DATA:  Shortness of breath, cough, wheezing, chest congestion EXAM: CHEST  2 VIEW COMPARISON:  02/13/2017 FINDINGS: Prior CABG. There is hyperinflation of the lungs compatible with COPD. Chronic interstitial prominence throughout the lungs. Scarring in the lung bases. No effusions or confluent airspace opacities. Heart is normal size. IMPRESSION: COPD/chronic changes.  No active disease. Electronically Signed   By: Charlett NoseKevin  Dover M.D.   On: 10/21/2017 10:22   Dg Chest Portable 1 View  Result Date: 10/29/2017 CLINICAL DATA:  Respiratory distress.  Shortness breath. EXAM: PORTABLE CHEST 1 VIEW COMPARISON:  10/21/2017 FINDINGS: Post median sternotomy. Interval cardiac enlargement. Development of diffuse interstitial and peribronchial thickening. Possible right pleural effusion. No pneumothorax or confluent airspace disease. IMPRESSION: Increase cardiomegaly with moderate pulmonary edema and possible right pleural effusion, consistent with CHF. Electronically Signed   By: Rubye OaksMelanie  Ehinger M.D.   On: 10/29/2017 05:40    EKG: Sinus tachycardia   Weights: Filed Weights   10/29/17 0534  Weight: 90.7 kg (200 lb)     Physical Exam: Blood pressure (!) 145/107, pulse (!) 114, resp. rate (!) 21, height 5\' 10"  (1.778 m), weight 90.7 kg (200 lb), SpO2 90 %. Body mass index is 28.7 kg/m. General: Well developed, well nourished, in no acute distress. Head eyes ears nose throat: Normocephalic, atraumatic, sclera non-icteric, no xanthomas, nares are without discharge. No apparent thyromegaly and/or mass  Lungs: Normal respiratory effort.  Some wheezes, nasal or rales, no rhonchi.  Heart: RRR with normal S1 S2. no murmur gallop, no rub, PMI is normal size and placement, carotid upstroke  normal without bruit, jugular venous pressure is normal Abdomen: Soft, non-tender, non-distended with normoactive bowel sounds. No hepatomegaly. No rebound/guarding. No obvious abdominal masses. Abdominal aorta is normal size without bruit Extremities: Trace edema. no cyanosis, no clubbing, no  ulcers  Peripheral : 2+ bilateral upper extremity pulses, 2+ bilateral femoral pulses, 2+ bilateral dorsal pedal pulse Neuro: Alert and oriented. No facial asymmetry. No focal deficit. Moves all extremities spontaneously. Musculoskeletal: Normal muscle tone without kyphosis Psych:  Responds to questions appropriately with a normal affect.    Assessment: 66 year old male with known coronary artery disease status post coronary bypass graft PCI and stent placement within the last several months with acute on chronic systolic dysfunction congestive heart failure and elevated troponin consistent with demand ischemia somewhat improved with appropriate medication management  Plan: 1. Continue intravenous Lasix for pulmonary edema and changed to oral Lasix tomorrow 2. Increase carvedilol for better heart rate control and treatment of hypertension and cardiomyopathy 3. Possible increase ACE inhibitor for max dose of 20 mg for hypertension control and congestive heart failure 4. Consider Aldactone and/or interest to as an outpatient when patient more stable 5. Continue following for troponin and CK-MBs to assess for possible myocardial infarction 6. Patient is counseled on low salt diet due to concerns that this is caused some of his exacerbation 7. Consider echocardiogram for reevaluation of extent of LV systolic dysfunction contributing to above 8. Begin ambulation and possible transfer to telemetry at this time for further adjustments of medications  Signed, Lamar Blinks M.D. [facility] Cardiology 10/29/2017, 10:07 AM

## 2017-10-29 NOTE — H&P (Addendum)
[facility] [place] [facility]   PATIENT NAME: Gary Gutierrez    MR#:  [id number]  DATE OF BIRTH:  1951/05/01  DATE OF ADMISSION:  10/29/2017  PRIMARY CARE PHYSICIAN: [name], MD   REQUESTING/REFERRING PHYSICIAN:   CHIEF COMPLAINT:   Chief Complaint  Patient presents with  . Respiratory Distress    HISTORY OF PRESENT ILLNESS: Gary Gutierrez  is a 66 y.o. male with a known history of coronary artery disease, diabetes for this type II, hyperlipidemia, hypertension, congestive heart failure presented to the emergency room with increased shortness of breath for one week. Patient has orthopnea. Patient complaints of cough for the last couple of days. The shortness of breath progressively worsened. He presented to the emergency room and was put on BiPAP and stabilized. Workup in the emergency room chest x-ray revealed bilateral pleural effusion and congestion. Patient was given IV Lasix 40 mg in the emergency for diuresis. Was given breathing treatments. Patient currently on oral tapering dose of steroids for COPD. No complaints of any chest pain.  PAST MEDICAL HISTORY:   Past Medical History:  Diagnosis Date  . Allergy   . CHF (congestive heart failure) (HCC)   . Coronary artery disease   . Diabetes mellitus without complication (HCC)   . Hyperlipidemia   . Hypertension     PAST SURGICAL HISTORY: Past Surgical History:  Procedure Laterality Date  . APPENDECTOMY    . colonscopy  2010   benign polyps  . CORONARY ARTERY BYPASS GRAFT  1995  . CORONARY STENT INTERVENTION N/A 02/03/2017   Procedure: Coronary Stent Intervention;  Surgeon: Alwyn Pea, MD;  Location: [facility] INVASIVE CV LAB;  Service: Cardiovascular;  Laterality: N/A;  . ESOPHAGOGASTRODUODENOSCOPY  2010   normal  . LEFT HEART CATH AND CORONARY ANGIOGRAPHY N/A 02/03/2017   Procedure: Left Heart Cath and Coronary Angiography;  Surgeon: Alwyn Pea, MD;  Location: [facility]  INVASIVE CV LAB;  Service: Cardiovascular;  Laterality: N/A;  . PERCUTANEOUS CORONARY STENT INTERVENTION (PCI-S)  2010, 2015   4 stents total    SOCIAL HISTORY:  Social History  Substance Use Topics  . Smoking status: Former Smoker    Types: Cigarettes    Quit date: 12/30/1994  . Smokeless tobacco: Never Used  . Alcohol use No    FAMILY HISTORY:  Family History  Problem Relation Age of Onset  . Diabetes Father   . Diabetes Brother   . Prostate cancer Brother   . Lung cancer Brother     DRUG ALLERGIES:  Allergies  Allergen Reactions  . Penicillins Other (See Comments)    Other reaction(s): Unknown, pt does not know reactions    REVIEW OF SYSTEMS:   CONSTITUTIONAL: No fever, fatigue or weakness.  EYES: No blurred or double vision.  EARS, NOSE, AND THROAT: No tinnitus or ear pain.  RESPIRATORY: Has cough, shortness of breath,  No wheezing or hemoptysis.  CARDIOVASCULAR: No chest pain,  Has orthopnea, edema.  GASTROINTESTINAL: No nausea, vomiting, diarrhea or abdominal pain.  GENITOURINARY: No dysuria, hematuria.  ENDOCRINE: No polyuria, nocturia,  HEMATOLOGY: No anemia, easy bruising or bleeding SKIN: No rash or lesion. MUSCULOSKELETAL: No joint pain or arthritis.   NEUROLOGIC: No tingling, numbness, weakness.  PSYCHIATRY: No anxiety or depression.   MEDICATIONS AT HOME:  Prior to Admission medications   Medication Sig Start Date End Date Taking? Authorizing Provider  albuterol (PROVENTIL HFA;VENTOLIN HFA) 108 (90 Base) MCG/ACT inhaler Inhale 1-2 puffs into the lungs  every 6 (six) hours as needed for wheezing or shortness of breath. 10/21/17  Yes Cook, Jayce G, DO  allopurinol (ZYLOPRIM) 300 MG tablet TAKE 1 TABLET BY MOUTH EVERY DAY 06/18/17  Yes Reubin MilanBerglund, [name], MD  aspirin EC 81 MG tablet Take 81 mg by mouth daily. Reported on 04/10/2016   Yes [provider]  atorvastatin (LIPITOR) 80 MG tablet TAKE 1 TABLET BY MOUTH AT BEDTIME 08/17/17  Yes Reubin MilanBerglund,  [name], MD  carvedilol (COREG) 25 MG tablet TAKE 1 TABLET(25 MG) BY MOUTH TWICE DAILY 09/15/17  Yes Reubin MilanBerglund, [name], MD  clopidogrel (PLAVIX) 75 MG tablet Take 75 mg by mouth daily.   Yes [provider]  doxycycline (VIBRA-TABS) 100 MG tablet Take 1 tablet (100 mg total) by mouth 2 (two) times daily. 10/24/17  Yes Reubin MilanBerglund, [name], MD  glipiZIDE (GLUCOTROL XL) 10 MG 24 hr tablet Take 1 tablet (10 mg total) by mouth daily. 06/11/17  Yes Reubin MilanBerglund, [name], MD  ibuprofen (ADVIL,MOTRIN) 800 MG tablet Take 1 tablet (800 mg total) by mouth every 8 (eight) hours as needed. 10/10/17  Yes Reubin MilanBerglund, [name], MD  isosorbide mononitrate (IMDUR) 30 MG 24 hr tablet Take 1 tablet (30 mg total) by mouth daily. 02/13/17 02/13/18 Yes [name], [name], MD  JARDIANCE 25 MG TABS tablet TAKE 1 TABLET BY MOUTH DAILY 06/18/17  Yes Reubin MilanBerglund, [name], MD  KOMBIGLYZE XR 2.04-999 MG TB24 TAKE 2 TABLETS BY MOUTH DAILY 10/20/17  Yes Reubin MilanBerglund, [name], MD  lisinopril (PRINIVIL,ZESTRIL) 5 MG tablet Take 1 tablet (5 mg total) by mouth daily. 10/10/17  Yes Reubin MilanBerglund, [name], MD  meloxicam (MOBIC) 15 MG tablet TAKE 1 TABLET(15 MG) BY MOUTH DAILY 10/20/17  Yes Reubin MilanBerglund, [name], MD  nitroGLYCERIN (NITROSTAT) 0.4 MG SL tablet Place 1 tablet (0.4 mg total) under the tongue every 5 (five) minutes as needed for chest pain. 02/04/17  Yes Katha HammingKonidena, [name], MD  pantoprazole (PROTONIX) 40 MG tablet Take 1 tablet (40 mg total) by mouth 2 (two) times daily. 10/10/17  Yes Reubin MilanBerglund, [name], MD  predniSONE (DELTASONE) 10 MG tablet Take 6 on day 1, 5 on day 2, 4 on day 3, 3 on day 4, 2 on day 5 and 1 on day 1 then stop. 10/24/17  Yes Reubin MilanBerglund, [name], MD  ACCU-CHEK AVIVA PLUS test strip TEST BLOOD SUGAR DAILY AS DIRECTED 11/03/16   Reubin MilanBerglund, [name], MD      PHYSICAL EXAMINATION:   VITAL SIGNS: Blood pressure (!) 132/95, pulse (!) 117, resp. rate (!) 24, height 5\' 10"  (1.778 m), weight 90.7 kg (200 lb), SpO2 (!) 89 %.  GENERAL:  66  y.o.-year-old patient lying in the bed in moderate respiratory distress on BiPAP EYES: Pupils equal, round, reactive to light and accommodation. No scleral icterus. Extraocular muscles intact.  HEENT: Head atraumatic, normocephalic. Oropharynx and nasopharynx clear.  NECK:  Supple, no jugular venous distention. No thyroid enlargement, no tenderness.  LUNGS: Decreased breath sounds bilaterally, bibasilar crepitations heard.  CARDIOVASCULAR: S1, S2 normal. No murmurs, rubs, or gallops.  ABDOMEN: Soft, nontender, nondistended. Bowel sounds present. No organomegaly or mass.  EXTREMITIES: has pedal edema,  No cyanosis, or clubbing.  NEUROLOGIC: Cranial nerves II through XII are intact. Muscle strength 5/5 in all extremities. Sensation intact. Gait not checked.  PSYCHIATRIC: The patient is alert and oriented x 3.  SKIN: No obvious rash, lesion, or ulcer.   LABORATORY PANEL:   CBC  Recent Labs Lab 10/29/17 0522  WBC 16.0*  HGB 17.1  HCT 52.7*  PLT 372  MCV 95.2  MCH 30.9  MCHC 32.4  RDW 15.2*   ------------------------------------------------------------------------------------------------------------------  Chemistries   Recent Labs Lab 10/29/17 0522  NA 139  K 3.4*  CL 104  CO2 26  GLUCOSE 205*  BUN 19  CREATININE 1.20  CALCIUM 9.0  AST 49*  ALT 53  ALKPHOS 109  BILITOT 0.9   ------------------------------------------------------------------------------------------------------------------ estimated creatinine clearance is 67.7 mL/min (by C-G formula based on SCr of 1.2 mg/dL). ------------------------------------------------------------------------------------------------------------------ No results for input(s): TSH, T4TOTAL, T3FREE, THYROIDAB in the last 72 hours.  Invalid input(s): FREET3   Coagulation profile No results for input(s): INR, PROTIME in the last 168  hours. ------------------------------------------------------------------------------------------------------------------- No results for input(s): DDIMER in the last 72 hours. -------------------------------------------------------------------------------------------------------------------  Cardiac Enzymes  Recent Labs Lab 10/29/17 0522  TROPONINI 0.03*   ------------------------------------------------------------------------------------------------------------------ Invalid input(s): POCBNP  ---------------------------------------------------------------------------------------------------------------  Urinalysis    Component Value Date/Time   COLORURINE YELLOW 07/09/2012 1250   APPEARANCEUR CLEAR 07/09/2012 1250   LABSPEC 1.025 07/09/2012 1250   PHURINE 6.0 07/09/2012 1250   GLUCOSEU NEGATIVE 07/09/2012 1250   HGBUR NEGATIVE 07/09/2012 1250   BILIRUBINUR neg 06/10/2017 0900   BILIRUBINUR NEGATIVE 07/09/2012 1250   KETONESUR NEGATIVE 07/09/2012 1250   PROTEINUR neg 06/10/2017 0900   PROTEINUR NEGATIVE 07/09/2012 1250   UROBILINOGEN 0.2 06/10/2017 0900   NITRITE neg 06/10/2017 0900   NITRITE NEGATIVE 07/09/2012 1250   LEUKOCYTESUR Negative 06/10/2017 0900   LEUKOCYTESUR NEGATIVE 07/09/2012 1250     RADIOLOGY: Dg Chest Portable 1 View  Result Date: 10/29/2017 CLINICAL DATA:  Respiratory distress.  Shortness breath. EXAM: PORTABLE CHEST 1 VIEW COMPARISON:  10/21/2017 FINDINGS: Post median sternotomy. Interval cardiac enlargement. Development of diffuse interstitial and peribronchial thickening. Possible right pleural effusion. No pneumothorax or confluent airspace disease. IMPRESSION: Increase cardiomegaly with moderate pulmonary edema and possible right pleural effusion, consistent with CHF. Electronically Signed   By: Rubye Oaks M.D.   On: 10/29/2017 05:40    EKG: Orders placed or performed during the hospital encounter of 10/29/17  . ED EKG  . ED EKG  . EKG  12-Lead  . EKG 12-Lead    IMPRESSION AND PLAN: 66 year old male patient with history of coronary artery disease, COPD, congestive heart failure, diabetes mellitus, hypertension, hyperlipidemia presented to the emergency room with shortness of breath.  Admitting diagnosis 1. Acute congestive heart failure exacerbation 2. Acute respiratory failure 3. Emphysema 4. Hypokalemia 5. Hypertension 5. Hyperlipidemia Treatment plan Admit patient to stepdown unit Continue BiPAP for respiratory distress IV Lasix 60 MG every 12 hourly for diuresis Breathing treatments Resume cardiac medications Cardiology evaluation Replace potassium Intensivist on call notified All the records are reviewed and case discussed with ED provider. Management plans discussed with the patient, family and they are in agreement.  CODE STATUS: Full code Code Status History    Date Active Date Inactive Code Status Order ID Comments User Context   02/03/2017 12:19 AM 02/04/2017  8:32 PM Full Code [id number]  Hugelmeyer, Jon Gills, DO Inpatient       TOTAL CRITICAL CARE TIME TAKING CARE OF THIS PATIENT: 54 minutes.    Ihor Austin M.D on 10/29/2017 at 6:46 AM  Between 7am to 6pm - Pager - [phone]  After 6pm go to [email or web address] - password [email or web address] [facility]  [facility]  Office  [phone]  CC: Primary care physician; [name], MD

## 2017-10-29 NOTE — Progress Notes (Signed)
Pt transferred out from ICU, cardiac monitor placed on pt and verified with Trey PaulaJeff, CNA.  Denies chest pain.  No distress on ra.  Oriented to room and surroundings. CB in reach, SR up x 2.

## 2017-10-29 NOTE — Progress Notes (Addendum)
This morning because of shortness of breath, CHF exacerbation.  Initially required BiPAP in the emergency room but by the time he came to ICU he did not require any BiPAP.  Right now he feels much better on Lasix, on 2 L of oxygen saturation is 97%.  He states that he feels much better than this morning.  Vitals, lab data reviewed, echocardiogram showed EF 20-25%. Physical examination: Alert, awake, oriented. Cardiovascular S1, S2 regular.  Lungs: Clear to auscultation, no wheeze, no rales. Lower extremities: No extremity edema is observed. Assessment and plan: #1 acute on chronic systolic heart failure secondary to dietary indiscretion, also drinks a lot of water. Continue IV Lasix, refer him to CHF clinic, will provide CHF education, check daily weights, 2 acute on chronic systolic heart failure: Continu Reglan, lisinopril, 3.  History of coronary disease, recent stent placement, continue statins, aspirin, Plavix, beta-blockers, nitrates. 4.  Diabetes mellitus type 2: Continue glipizide, add Lantus as per diabetic nurse recommendation . D/w family Time spent 25 minutes.

## 2017-10-29 NOTE — ED Triage Notes (Addendum)
Pt to room 14 via w/c, with resp distress; pt very anxious/restless, audible crackles pt reports diff breathing x hour; denies any pain; st hx COPD and has been on steroids recently; sat 79% ra; Dr Manson PasseyBrown to bedside; O2 placed at 100% NRB and RT called to room

## 2017-10-29 NOTE — Progress Notes (Signed)
*  PRELIMINARY RESULTS* Echocardiogram 2D Echocardiogram has been performed.  Cristela BlueHege, Sharika Mosquera 10/29/2017, 11:58 AM

## 2017-10-29 NOTE — Care Management Note (Signed)
Case Management Note  Patient Details  Name: Gary Gutierrez MRN: [id number] Date of Birth: 09-Apr-1951  Subjective/Objective:                  Met with patient and his wife to discuss discharge planning. He is independent from home; drives. Supplemental O2 is acute.  His PCP is Halina Maidens. He agrees to outpatient cardiopulmonary clinic. He states he has Medicaid that helps with most medications.  Action/Plan: Home health list delivered however I do not believe he will qualify. RNCM will follow.   Expected Discharge Date:                  Expected Discharge Plan:     In-House Referral:     Discharge planning Services  CM Consult  Post Acute Care Choice:  Durable Medical Equipment, Home Health Choice offered to:  Patient, Spouse  DME Arranged:    DME Agency:     HH Arranged:    [place] Agency:     Status of Service:  In process, will continue to follow  If discussed at Long Length of Stay Meetings, dates discussed:    Additional Comments:  Marshell Garfinkel, RN 10/29/2017, 1:08 PM

## 2017-10-29 NOTE — Consult Note (Signed)
Name: Gary Gutierrez MRN: [id number] DOB: April 15, 1951    ADMISSION DATE:  10/29/2017 CONSULTATION DATE: 10/29/2017  REFERRING MD : Dr. Luberta MutterKonidena  CHIEF COMPLAINT: Shortness of Breath  BRIEF PATIENT DESCRIPTION:  66 yo male admitted 10/31 with acute on chronic hypoxic respiratory failure secondary to pulmonary edema requiring continuous Bipap  SIGNIFICANT EVENTS  10/31-Pt admitted to stepdown unit   STUDIES:  Echo 10/31>>  HISTORY OF PRESENT ILLNESS:   This is a 66 yo male with a PMH of HTN, Hyperlipidemia, Diabetes Mellitus, CAD, CHF, and Allergies.  [facility]31 with c/o shortness of breath and anxiety onset of symptoms 1 hour prior to presentation.  In the ER he was found to be hypoxic O2 sats 79% and initially placed on 100% NRB, however due to worsening respiratory failure he was transitioned to continuous Bipap.  CXR revealed pulmonary edema, therefore he received iv lasix in the ER.  He was subsequently admitted to the stepdown unit by hospitalist team for further workup and treatment PCCM consulted.    PAST MEDICAL HISTORY :   has a past medical history of Allergy; CHF (congestive heart failure) (HCC); Coronary artery disease; Diabetes mellitus without complication (HCC); Hyperlipidemia; and Hypertension.  has a past surgical history that includes Appendectomy; colonscopy (2010); Esophagogastroduodenoscopy (2010); Coronary artery bypass graft (1995); Percutaneous coronary stent intervention (pci-s) (2010, 2015); LEFT HEART CATH AND CORONARY ANGIOGRAPHY (N/A, 02/03/2017); and CORONARY STENT INTERVENTION (N/A, 02/03/2017). Prior to Admission medications   Medication Sig Start Date End Date Taking? Authorizing Provider  albuterol (PROVENTIL HFA;VENTOLIN HFA) 108 (90 Base) MCG/ACT inhaler Inhale 1-2 puffs into the lungs every 6 (six) hours as needed for wheezing or shortness of breath. 10/21/17  Yes Cook, Jayce G, DO  allopurinol (ZYLOPRIM) 300 MG tablet TAKE 1 TABLET  BY MOUTH EVERY DAY 06/18/17  Yes Reubin MilanBerglund, [name], MD  aspirin EC 81 MG tablet Take 81 mg by mouth daily. Reported on 04/10/2016   Yes [provider]  atorvastatin (LIPITOR) 80 MG tablet TAKE 1 TABLET BY MOUTH AT BEDTIME 08/17/17  Yes Reubin MilanBerglund, [name], MD  carvedilol (COREG) 25 MG tablet TAKE 1 TABLET(25 MG) BY MOUTH TWICE DAILY 09/15/17  Yes Reubin MilanBerglund, [name], MD  clopidogrel (PLAVIX) 75 MG tablet Take 75 mg by mouth daily.   Yes [provider]  doxycycline (VIBRA-TABS) 100 MG tablet Take 1 tablet (100 mg total) by mouth 2 (two) times daily. 10/24/17  Yes Reubin MilanBerglund, [name], MD  glipiZIDE (GLUCOTROL XL) 10 MG 24 hr tablet Take 1 tablet (10 mg total) by mouth daily. 06/11/17  Yes Reubin MilanBerglund, [name], MD  ibuprofen (ADVIL,MOTRIN) 800 MG tablet Take 1 tablet (800 mg total) by mouth every 8 (eight) hours as needed. 10/10/17  Yes Reubin MilanBerglund, [name], MD  isosorbide mononitrate (IMDUR) 30 MG 24 hr tablet Take 1 tablet (30 mg total) by mouth daily. 02/13/17 02/13/18 Yes [name], [name], MD  JARDIANCE 25 MG TABS tablet TAKE 1 TABLET BY MOUTH DAILY 06/18/17  Yes Reubin MilanBerglund, [name], MD  KOMBIGLYZE XR 2.04-999 MG TB24 TAKE 2 TABLETS BY MOUTH DAILY 10/20/17  Yes Reubin MilanBerglund, [name], MD  lisinopril (PRINIVIL,ZESTRIL) 5 MG tablet Take 1 tablet (5 mg total) by mouth daily. 10/10/17  Yes Reubin MilanBerglund, [name], MD  meloxicam (MOBIC) 15 MG tablet TAKE 1 TABLET(15 MG) BY MOUTH DAILY 10/20/17  Yes Reubin MilanBerglund, [name], MD  nitroGLYCERIN (NITROSTAT) 0.4 MG SL tablet Place 1 tablet (0.4 mg total) under the tongue  every 5 (five) minutes as needed for chest pain. 02/04/17  Yes [name], MD  pantoprazole (PROTONIX) 40 MG tablet Take 1 tablet (40 mg total) by mouth 2 (two) times daily. 10/10/17  Yes [name], MD  predniSONE (DELTASONE) 10 MG tablet Take 6 on day 1, 5 on day 2, 4 on day 3, 3 on day 4, 2 on day 5 and 1 on day 1 then stop. 10/24/17  Yes [name], MD  ACCU-CHEK AVIVA PLUS test strip  TEST BLOOD SUGAR DAILY AS DIRECTED 11/03/16   [name], MD   Allergies  Allergen Reactions  . Penicillins Other (See Comments)    Other reaction(s): Unknown, pt does not know reactions    FAMILY HISTORY:  family history includes Diabetes in his brother and father; Lung cancer in his brother; Prostate cancer in his brother. SOCIAL HISTORY:  reports that he quit smoking about 22 years ago. His smoking use included Cigarettes. He has never used smokeless tobacco. He reports that he does not drink alcohol or use drugs.  REVIEW OF SYSTEMS: Positives in BOLD   Constitutional: Negative for fever, chills, weight loss, malaise/fatigue and diaphoresis.  HENT: Negative for hearing loss, ear pain, nosebleeds, congestion, sore throat, neck pain, tinnitus and ear discharge.   Eyes: Negative for blurred vision, double vision, photophobia, pain, discharge and redness.  Respiratory: cough, hemoptysis, sputum production, shortness of breath, wheezing and stridor.   Cardiovascular: Negative for chest pain, palpitations, orthopnea, claudication, leg swelling and PND.  Gastrointestinal: Negative for heartburn, nausea, vomiting, abdominal pain, diarrhea, constipation, blood in stool and melena.  Genitourinary: Negative for dysuria, urgency, frequency, hematuria and flank pain.  Musculoskeletal: Negative for myalgias, back pain, joint pain and falls.  Skin: Negative for itching and rash.  Neurological: Negative for dizziness, tingling, tremors, sensory change, speech change, focal weakness, seizures, loss of consciousness, weakness and headaches.  Endo/Heme/Allergies: Negative for environmental allergies and polydipsia. Does not bruise/bleed easily.  SUBJECTIVE:  Pt off Bipap currently on 2L O2 via nasal canula and states breathing has improved significantly.  VITAL SIGNS: Pulse Rate:  [111-158] 114 (10/31 0900) Resp:  [14-60] 21 (10/31 0900) BP: (108-207)/(79-168) 145/107 (10/31 0900) SpO2:  [89  %-100 %] 90 % (10/31 0900) Weight:  [90.7 kg (200 lb)] 90.7 kg (200 lb) (10/31 0534)  PHYSICAL EXAMINATION: General: well developed, well nourished male, NAD  Neuro: alert and oriented, follows commands  HEENT: supple, no JVD  Cardiovascular: sinus tach, s1s2, no M/R/G  Lungs: faint crackles throughout, even, non labored  Abdomen: +BS x4, soft, non distended, non tender  Musculoskeletal: normal bulk and tone, no edema  Skin: intact no rashes or lesions   Recent Labs Lab 10/29/17 0522  NA 139  K 3.4*  CL 104  CO2 26  BUN 19  CREATININE 1.20  GLUCOSE 205*    Recent Labs Lab 10/29/17 0522  HGB 17.1  HCT 52.7*  WBC 16.0*  PLT 372   Dg Chest Portable 1 View  Result Date: 10/29/2017 CLINICAL DATA:  Respiratory distress.  Shortness breath. EXAM: PORTABLE CHEST 1 VIEW COMPARISON:  10/21/2017 FINDINGS: Post median sternotomy. Interval cardiac enlargement. Development of diffuse interstitial and peribronchial thickening. Possible right pleural effusion. No pneumothorax or confluent airspace disease. IMPRESSION: Increase cardiomegaly with moderate pulmonary edema and possible right pleural effusion, consistent with CHF. Electronically Signed   By: Rubye Oaks M.D.   On: 10/29/2017 05:40    ASSESSMENT / PLAN: Acute on chronic hypoxic respiratory failure  secondary to CHF exacerbation Hypokalemia  Elevated troponin's likely secondary to demand ischemia  Diabetes Mellitus  Hx: HTN, CAD, and Hyperlipidemia P: Prn Bipap or supplemental O2 for dyspnea Maintain O2 sats >92% Repeat CXR in the am  Continue scheduled and prn bronchodilator therapy  Continue iv lasix  Cardiology consulted appreciate input  Continuous telemetry monitoring  Trend troponin's  Continue outpatient cardiac medications  Subq heparin for VTE prophylaxis Trend CBC Trend BMP Replace electrolytes as indicated  Monitor UOP Continue glipizide, tradjenta, and SSI   -If pt remains off Bipap will  transfer pt out of the Stepdown Unit today 10/29/17 and PCCM will sign off once bed becomes available.  Sonda Rumble, AGNP  Pulmonary/Critical Care Pager [phone] (please enter 7 digits) PCCM Consult Pager [phone] (please enter 7 digits)   PCCM ATTENDING ATTESTATION: I have evaluated patient with the APP [place], reviewed database in its entirety and discussed care plan in detail. In addition, this patient was discussed on multidisciplinary rounds.   Important exam findings: NAD on [place] O2 No wheezes Faint B basilar crackles  Major problems addressed by PCCM team: Acute hypoxemic respiratory failure CHF exacerbation with pulmonary edema - improved after diuresis   PLAN/REC: As above Transfer to MedSurg with cardiac monitoring After transfer, PCCM will sign off  Billy Fischer, MD PCCM service Mobile [phone] Pager [phone] 10/29/2017 2:17 PM

## 2017-10-29 NOTE — Discharge Instructions (Signed)
Heart Failure Clinic appointment on November 10 2017 at 11:20am with Clarisa Kindredina Celes Dedic, FNP. Please call [phone] to reschedule.

## 2017-10-29 NOTE — ED Notes (Signed)
Pt calm at present, able to sit back on stretcher for CXR; reports feeling some better at present; O2 sat 100%, HR 135; RR 34

## 2017-10-30 ENCOUNTER — Inpatient Hospital Stay: Payer: Medicare Other

## 2017-10-30 DIAGNOSIS — I509 Heart failure, unspecified: Secondary | ICD-10-CM | POA: Diagnosis not present

## 2017-10-30 DIAGNOSIS — J449 Chronic obstructive pulmonary disease, unspecified: Secondary | ICD-10-CM | POA: Diagnosis not present

## 2017-10-30 DIAGNOSIS — I5022 Chronic systolic (congestive) heart failure: Secondary | ICD-10-CM | POA: Diagnosis not present

## 2017-10-30 DIAGNOSIS — J9600 Acute respiratory failure, unspecified whether with hypoxia or hypercapnia: Secondary | ICD-10-CM | POA: Diagnosis not present

## 2017-10-30 DIAGNOSIS — I10 Essential (primary) hypertension: Secondary | ICD-10-CM | POA: Diagnosis not present

## 2017-10-30 DIAGNOSIS — I2510 Atherosclerotic heart disease of native coronary artery without angina pectoris: Secondary | ICD-10-CM | POA: Diagnosis not present

## 2017-10-30 DIAGNOSIS — Z7902 Long term (current) use of antithrombotics/antiplatelets: Secondary | ICD-10-CM | POA: Diagnosis not present

## 2017-10-30 DIAGNOSIS — I5023 Acute on chronic systolic (congestive) heart failure: Secondary | ICD-10-CM | POA: Diagnosis not present

## 2017-10-30 DIAGNOSIS — E785 Hyperlipidemia, unspecified: Secondary | ICD-10-CM | POA: Diagnosis not present

## 2017-10-30 DIAGNOSIS — Z7984 Long term (current) use of oral hypoglycemic drugs: Secondary | ICD-10-CM | POA: Diagnosis not present

## 2017-10-30 DIAGNOSIS — Z79899 Other long term (current) drug therapy: Secondary | ICD-10-CM | POA: Diagnosis not present

## 2017-10-30 DIAGNOSIS — J9601 Acute respiratory failure with hypoxia: Secondary | ICD-10-CM | POA: Diagnosis not present

## 2017-10-30 DIAGNOSIS — Z951 Presence of aortocoronary bypass graft: Secondary | ICD-10-CM | POA: Diagnosis not present

## 2017-10-30 DIAGNOSIS — E876 Hypokalemia: Secondary | ICD-10-CM | POA: Diagnosis not present

## 2017-10-30 DIAGNOSIS — I110 Hypertensive heart disease with heart failure: Secondary | ICD-10-CM | POA: Diagnosis not present

## 2017-10-30 DIAGNOSIS — Z880 Allergy status to penicillin: Secondary | ICD-10-CM | POA: Diagnosis not present

## 2017-10-30 DIAGNOSIS — E119 Type 2 diabetes mellitus without complications: Secondary | ICD-10-CM | POA: Diagnosis not present

## 2017-10-30 DIAGNOSIS — I25810 Atherosclerosis of coronary artery bypass graft(s) without angina pectoris: Secondary | ICD-10-CM | POA: Diagnosis not present

## 2017-10-30 DIAGNOSIS — Z87891 Personal history of nicotine dependence: Secondary | ICD-10-CM | POA: Diagnosis not present

## 2017-10-30 LAB — GLUCOSE, CAPILLARY
GLUCOSE-CAPILLARY: 115 mg/dL — AB (ref 65–99)
Glucose-Capillary: 121 mg/dL — ABNORMAL HIGH (ref 65–99)
Glucose-Capillary: 208 mg/dL — ABNORMAL HIGH (ref 65–99)

## 2017-10-30 LAB — BASIC METABOLIC PANEL
Anion gap: 9 (ref 5–15)
BUN: 30 mg/dL — ABNORMAL HIGH (ref 6–20)
CHLORIDE: 102 mmol/L (ref 101–111)
CO2: 27 mmol/L (ref 22–32)
CREATININE: 0.98 mg/dL (ref 0.61–1.24)
Calcium: 9.2 mg/dL (ref 8.9–10.3)
GFR calc non Af Amer: >60 mL/min (ref >60)
GLUCOSE: 102 mg/dL — AB (ref 65–99)
Potassium: 3.7 mmol/L (ref 3.5–5.1)
Sodium: 138 mmol/L (ref 135–145)

## 2017-10-30 MED ORDER — FUROSEMIDE 40 MG PO TABS: 40.0000 mg | ORAL_TABLET | Freq: Two times a day (BID) | ORAL | 0 refills | Status: DC

## 2017-10-30 MED ORDER — MELOXICAM 15 MG PO TABS: 15.0000 mg | ORAL_TABLET | Freq: Every day | ORAL | 0 refills | Status: DC

## 2017-10-30 MED ORDER — POTASSIUM CHLORIDE CRYS ER 20 MEQ PO TBCR: 20.0000 meq | EXTENDED_RELEASE_TABLET | Freq: Every day | ORAL | 0 refills | Status: DC

## 2017-10-30 MED ORDER — CARVEDILOL 12.5 MG PO TABS: 12.5000 mg | ORAL_TABLET | Freq: Two times a day (BID) | ORAL | 0 refills | Status: DC

## 2017-10-30 NOTE — Care Management (Signed)
Patient has access to scales and an appointment has been made with Heart Failure Clinic

## 2017-10-30 NOTE — Progress Notes (Signed)
[facility]  Patient: Gary Gutierrez / Admit Date: 10/29/2017 / Date of Encounter: 10/30/2017, 8:34 AM   Subjective: Significant improvements in shortness of breath. Patient no longer having heart failure type symptoms. Patienton apropriatemeicationmaagment a his imeand nt toleratingwell. To cardiogram showing sevee LV systolic dysfunction with ejection fration of 20%  Review of Systems: Positive for: shortness of breath Negative for: Vision change, hearing change, syncope, dizziness, nausea, vomiting,diarrhea, bloody stool, stomach pain, cough, congestion, diaphoresis, urinary frequency, urinary pain,skin lesions, skin rashes Others previously listed  Objective: Telemetry: normal sinu rhythm Physical Exam: Blood pressure (!) 128/100, pulse 84, temperature 98 F (36.7 C), temperature source Oral, resp. rate 18, height 5\' 10"  (1.778 m), weight 84 kg (185 lb 1.6 oz), SpO2 98 %. Body mass index is 26.56 kg/m. General: Well developed, well nourished, in no acute distress. Head: Normocephalic, atraumatic, sclera non-icteric, no xanthomas, nares are without discharge. Neck: No apparent masses Lungs: Normal respirations with no wheezes, no rhonchi, no rales , no crackles   Heart: Regular rate and rhythm, normal S1 S2, no murmur, no rub, no gallop, PMI is normal size and placement, carotid upstroke normal without bruit, jugular venous pressure normal Abdomen: Soft, non-tender, non-distended with normoactive bowel sounds. No hepatosplenomegaly. Abdominal aorta is normal size without bruit Extremities: No edema, no clubbing, no cyanosis, no ulcers,  Peripheral: 2+ radial, 2+ femoral, 2+ dorsal pedal pulses Neuro: Alert and oriented. Moves all extremities spontaneously. Psych:  Responds to questions appropriately with a normal affect.   Intake/Output Summary (Last 24 hours) at 10/30/17 0834 Last data filed at 10/29/17 1948  Gross per 24 hour  Intake                 0 ml  Output             2850 ml  Net            -2850 ml    Inpatient Medications:  . allopurinol  300 mg Oral Daily  . aspirin EC  81 mg Oral Daily  . atorvastatin  80 mg Oral QHS  . carvedilol  12.5 mg Oral BID WC  . clopidogrel  75 mg Oral Daily  . furosemide  60 mg Intravenous Q12H  . glipiZIDE  10 mg Oral Daily  . heparin  5,000 Units Subcutaneous Q8H  . insulin aspart  0-15 Units Subcutaneous TID WC  . insulin aspart  0-5 Units Subcutaneous QHS  . insulin glargine  10 Units Subcutaneous QHS  . isosorbide mononitrate  30 mg Oral Daily  . lisinopril  5 mg Oral Daily  . meloxicam  15 mg Oral Daily  . pantoprazole  40 mg Oral BID  . potassium chloride  20 mEq Oral Daily  . potassium chloride  40 mEq Oral BID  . sodium chloride flush  3 mL Intravenous Q12H   Infusions:  . sodium chloride      Labs:  Recent Labs  10/29/17 0522 10/30/17 0432  NA 139 138  K 3.4* 3.7  CL 104 102  CO2 26 27  GLUCOSE 205* 102*  BUN 19 30*  CREATININE 1.20 0.98  CALCIUM 9.0 9.2    Recent Labs  10/29/17 0522  AST 49*  ALT 53  ALKPHOS 109  BILITOT 0.9  PROT 7.4  ALBUMIN 3.8    Recent Labs  10/29/17 0522  WBC 16.0*  HGB 17.1  HCT 52.7*  MCV 95.2  PLT 372    Recent  Labs  10/29/17 0522 10/29/17 0839 10/29/17 1352 10/29/17 1953  TROPONINI 0.03* 0.44* 1.01* 0.81*   Invalid input(s): POCBNP No results for input(s): HGBA1C in the last 72 hours.   Weights: Filed Weights   10/29/17 0534 10/30/17 0500  Weight: 90.7 kg (200 lb) 84 kg (185 lb 1.6 oz)     Radiology/Studies:  Dg Chest 2 View  Result Date: 10/21/2017 CLINICAL DATA:  Shortness of breath, cough, wheezing, chest congestion EXAM: CHEST  2 VIEW COMPARISON:  02/13/2017 FINDINGS: Prior CABG. There is hyperinflation of the lungs compatible with COPD. Chronic interstitial prominence throughout the lungs. Scarring in the lung bases. No effusions or confluent airspace opacities. Heart is normal size.  IMPRESSION: COPD/chronic changes.  No active disease. Electronically Signed   By: Charlett Nose M.D.   On: 10/21/2017 10:22   Dg Chest Port 1 View  Result Date: 10/30/2017 CLINICAL DATA:  Acute respiratory failure. EXAM: PORTABLE CHEST 1 VIEW COMPARISON:  Chest x-ray from yesterday. FINDINGS: Stable cardiomediastinal silhouette. Pulmonary vascular congestion and basilar predominant interstitial and alveolar opacities, improved when compared to prior study. No large pleural effusion. No pneumothorax. No acute osseous abnormality. Prior median sternotomy. IMPRESSION: Improved pulmonary vascular congestion and edema. Electronically Signed   By: Obie Dredge M.D.   On: 10/30/2017 08:27   Dg Chest Portable 1 View  Result Date: 10/29/2017 CLINICAL DATA:  Respiratory distress.  Shortness breath. EXAM: PORTABLE CHEST 1 VIEW COMPARISON:  10/21/2017 FINDINGS: Post median sternotomy. Interval cardiac enlargement. Development of diffuse interstitial and peribronchial thickening. Possible right pleural effusion. No pneumothorax or confluent airspace disease. IMPRESSION: Increase cardiomegaly with moderate pulmonary edema and possible right pleural effusion, consistent with CHF. Electronically Signed   By: Rubye Oaks M.D.   On: 10/29/2017 05:40     Assessment and Recommendation  66 y.o. male  With known coronary artery disease status post cnary atery bypass graft ad ultiplpevius myoardia farctions having episodes of acuteon chronic systolic dysfuction congestiv heart failure still on appropriat medicationgnificant pulmonary edema multifactorial in nature but possibly consistent with dietary indiscretion.  1. Continue carvedilol lisinopril and increase doses asnecessry for uther tretetof acute onchronc sstolc dysfunctihat ailue  2. Contiu fuosemide changed to oral medication at   40 mg  3. Dual antiplatelet therapy for previous pCI nd stent placement erlier this year 4. Continue treatment oflow-sodium  diet 5. Okay for discharge to home with follow-up next wek    Signed, Arnoldo Hooker M.D. FACC

## 2017-10-30 NOTE — Progress Notes (Addendum)
Len BlalockJames W Baka to be D/C'd Home per MD order.  Discussed prescriptions and follow up appointments with the patient. Prescriptions were e-prescribed, medication list explained in detail. Explained CHF packet and CHF videos with pt and wife, Pt verbalized understanding.  Allergies as of 10/30/2017      Reactions   Penicillins Other (See Comments)   Other reaction(s): Unknown, pt does not know reactions      Medication List    STOP taking these medications   doxycycline 100 MG tablet Commonly known as:  VIBRA-TABS   ibuprofen 800 MG tablet Commonly known as:  ADVIL,MOTRIN   pantoprazole 40 MG tablet Commonly known as:  PROTONIX   predniSONE 10 MG tablet Commonly known as:  DELTASONE     TAKE these medications   ACCU-CHEK AVIVA PLUS test strip Generic drug:  glucose blood TEST BLOOD SUGAR DAILY AS DIRECTED   albuterol 108 (90 Base) MCG/ACT inhaler Commonly known as:  PROVENTIL HFA;VENTOLIN HFA Inhale 1-2 puffs into the lungs every 6 (six) hours as needed for wheezing or shortness of breath.   allopurinol 300 MG tablet Commonly known as:  ZYLOPRIM TAKE 1 TABLET BY MOUTH EVERY DAY   aspirin EC 81 MG tablet Take 81 mg by mouth daily. Reported on 04/10/2016   atorvastatin 80 MG tablet Commonly known as:  LIPITOR TAKE 1 TABLET BY MOUTH AT BEDTIME   carvedilol 12.5 MG tablet Commonly known as:  COREG Take 1 tablet (12.5 mg total) by mouth 2 (two) times daily with a meal. What changed:  medication strength  See the new instructions.   clopidogrel 75 MG tablet Commonly known as:  PLAVIX Take 75 mg by mouth daily.   furosemide 40 MG tablet Commonly known as:  LASIX Take 1 tablet (40 mg total) by mouth 2 (two) times daily.   glipiZIDE 10 MG 24 hr tablet Commonly known as:  GLUCOTROL XL Take 1 tablet (10 mg total) by mouth daily.   isosorbide mononitrate 30 MG 24 hr tablet Commonly known as:  IMDUR Take 1 tablet (30 mg total) by mouth daily.   JARDIANCE 25 MG  Tabs tablet Generic drug:  empagliflozin TAKE 1 TABLET BY MOUTH DAILY   KOMBIGLYZE XR 2.04-999 MG Tb24 Generic drug:  Saxagliptin-Metformin TAKE 2 TABLETS BY MOUTH DAILY   lisinopril 5 MG tablet Commonly known as:  PRINIVIL,ZESTRIL Take 1 tablet (5 mg total) by mouth daily.   meloxicam 15 MG tablet Commonly known as:  MOBIC Take 1 tablet (15 mg total) by mouth daily. What changed:  See the new instructions.   nitroGLYCERIN 0.4 MG SL tablet Commonly known as:  NITROSTAT Place 1 tablet (0.4 mg total) under the tongue every 5 (five) minutes as needed for chest pain.   potassium chloride SA 20 MEQ tablet Commonly known as:  K-DUR,KLOR-CON Take 1 tablet (20 mEq total) by mouth daily.       Vitals:   10/30/17 0900 10/30/17 1216  BP: 132/86 109/74  Pulse: 94 100  Resp:  18  Temp:  (!) 97.5 F (36.4 C)  SpO2: 95% 95%    Skin clean, dry and intact without evidence of skin break down, no evidence of skin tears noted. IV catheter discontinued intact. Site without signs and symptoms of complications. Dressing and pressure applied. Pt denies pain at this time. Tele box removed and returned.No complaints noted.  An After Visit Summary was printed and given to the patient. Patient escorted via WC, and D/C home via private auto.  Rolley Sims

## 2017-10-30 NOTE — Care Management Obs Status (Signed)
MEDICARE OBSERVATION STATUS NOTIFICATION   Patient Details  Name: Roney MarionJames W Ibbotson MRN: [id number] Date of Birth: Mar 31, 1951   Medicare Observation Status Notification Given:  Yes  Notice signed, one given to patient and the other to HIM for scanning   Eber HongGreene, Lisaann Atha R, RN 10/30/2017, 2:19 PM

## 2017-10-30 NOTE — Care Management CC44 (Signed)
Condition Code 44 Documentation Completed  Patient Details  Name: Gary Gutierrez MRN: [id number] Date of Birth: 06-23-1951   Condition Code 44 given:  Yes Patient signature on Condition Code 44 notice:  Yes Documentation of 2 MD's agreement:  Yes Code 44 added to claim:  Yes    Eber HongGreene, Tavius Turgeon R, RN 10/30/2017, 2:20 PM

## 2017-10-31 ENCOUNTER — Telehealth: Payer: Self-pay

## 2017-10-31 NOTE — Telephone Encounter (Signed)
Transition Care Management Follow-Up Telephone Call   Date [facility] and where[facility]  How have you been since you were released from the hospital? States dyspnea has improved  Any patient concerns? Verbalized understanding about the changes made to his medication regimen but voiced concerns about the recommendation to stop his Protonix. States he is concerned his acid reflux will return if he discontinues this medication. Advised I would discuss his concerns with Dr. Judithann GravesBerglund. In the meantime, reminded to avoid caffeine, spicy and/or fatty foods and eliminate carbonated beverages from diet. May also sleep with head elevated to reduce indigestion.  Reminded pt to monitor for swelling in feet, ankles and hands. Will also want to weigh himself daily. If weight increases more than 3-4 pounds within 1-2 days, call Dr. Karn CassisBerglund's office for further instructions.    Items Reviewed:   Meds: Verbalized acceptance and understanding of changes made to his medication regimen. Confirmed he picked up his medications from [facility] [place].  Allergies: Denies any new medication allergies  Dietary Changes Reviewed: Verbalized acceptance and understanding of maintaining a low sodium diet.  Functional Questionnaire:  Independent-I Dependent-D  ADLs:   Dressing- I    Eating- I   Maintaining continence- I   Transferring- I   Transportation- I   Meal Prep- I   Managing Meds- I  Confirmed importance and Date/Time of follow-up visits scheduled: Confirmed f/u appt with Dr. Judithann GravesBerglund on 11/07/17 @ 11:00am. Also confirmed his appt with Dr. Juliann Paresallwood at the [facility] on 11/10/17 @ 12:00pm.   Confirmed with patient if condition worsens to call PCP or go to the Emergency Dept. Patient was given office number and encouraged to call back with questions or concerns:

## 2017-11-03 NOTE — Discharge Summary (Signed)
Gary Gutierrez, is a 66 y.o. male  DOB [birth year]  MRN [id number].  Admission date:  10/29/2017  Admitting Physician  Ihor Austin, MD  Discharge Date:  10/30/2017   Primary MD  [name], MD  Recommendations for primary care physician for things to follow:   Follow with PCP in 1 week Follow-up with Dr. Juliann Pares in 1 week Follow-up with heart failure clinic on November 12.  Admission Diagnosis  Acute respiratory failure with hypoxia (HCC) [J96.01] Hypertensive emergency [I16.1] Acute on chronic congestive heart failure, unspecified heart failure type (HCC) [I50.9]   Discharge Diagnosis  Acute respiratory failure with hypoxia (HCC) [J96.01] Hypertensive emergency [I16.1] Acute on chronic congestive heart failure, unspecified heart failure type (HCC) [I50.9]    Active Problems:   CHF (congestive heart failure) (HCC)      Past Medical History:  Diagnosis Date  . Allergy   . CHF (congestive heart failure) (HCC)   . Coronary artery disease   . Diabetes mellitus without complication (HCC)   . Hyperlipidemia   . Hypertension     Past Surgical History:  Procedure Laterality Date  . APPENDECTOMY    . colonscopy  2010   benign polyps  . CORONARY ARTERY BYPASS GRAFT  1995  . ESOPHAGOGASTRODUODENOSCOPY  2010   normal  . PERCUTANEOUS CORONARY STENT INTERVENTION (PCI-S)  2010, 2015   4 stents total       History of present illness and  Hospital Course:     Kindly see H&P for history of present illness and admission details, please review complete Labs, Consult reports and Test reports for all details in brief  HPI  from the history and physical done on the day of admission 66 year old male patient with history of coronary artery disease, PCI, diabetes mellitus type 2, hypertension, hyperlipidemia  came in because of shortness of breath, orthopnea, PND, pedal edema.  Admitted to ICU for acute on chronic systolic heart failure, patient required BiPAP transiently initially.   Hospital Course  #1 acute respiratory failure secondary to acute on chronic systolic heart failure.  Patient initially placed on 100% nonrebreather, because of worsening of shortness of breath patient received BiPAP temporarily, chest x-ray showed pulmonary edema and he is admitted to stepdown.  Patient received IV Lasix, symptoms of shortness of breath improved with Lasix, patient did not require any BiPAP after.  Transferred to telemetry, patient oxygenation improved, oxygen saturation 95% on room air at the time of discharge.  Discharged home in stable condition with Lasix 40 mg p.o. twice daily for a few days and then follow-up with her cardiologist to adjust the dose of Lasix as needed, he is on lisinopril, Coreg, echocardiogram showed EF 20-25% with diffuse hypokinesia, patient seen by Dr. Gwen Pounds from cardiology, and history of previous cardiac bypass surgery, recent PCI in February.  Increase the dose of Coreg, ACE inhibitors, cardiology is planning to start him on Aldactone as an outpatient.  Patient is counseled about low salt diet, checking daily weights.  Patient told me that he uses a lot of canned foods, additional salt  In  his food and we counseled him about salt intake.  Advised the patient to continue aspirin, Plavix, nitrates, statins, beta-blockers, ACE inhibitors, cardiology is planning to start the patient on Aldactone as outpatient.  #2 slightly elevated troponin secondary to demand ischemia from heart failure. 3.  Diabetes mellitus type 2: Advised the patient to continue his, home medication. 4.  Recent COPD, chronic bronchitis flareup.  Received prednisone, antibiotics.  Patient did not have any wheezing, because of CHF symptoms patient advised not to take prednisone anymore, also repeat his doxycycline.   No further need of antibiotics.   Discharge Condition: Stable   Follow UP  Follow-up Information    [facility] Follow up on 11/10/2017.   Specialty:  Cardiology Why:  at 11:20am Contact information: [address] [place] [id number] [phone]       Reubin MilanBerglund, [name], MD. Go on 11/07/2017.   Specialty:  Internal Medicine Why:   @11am  Contact information: [address] [place] [address] [phone]        Alwyn Peaallwood, Dwayne D, MD. Schedule an appointment as soon as possible for a visit on 11/10/2017.   Specialties:  Cardiology, Internal Medicine Why:  @ 12  Contact information: [address] [facility] [place] [address] [phone]             Discharge Instructions  and  Discharge Medications      Allergies as of 10/30/2017      Reactions   Penicillins Other (See Comments)   Other reaction(s): Unknown, pt does not know reactions      Medication List    STOP taking these medications   doxycycline 100 MG tablet Commonly known as:  VIBRA-TABS   ibuprofen 800 MG tablet Commonly known as:  ADVIL,MOTRIN   pantoprazole 40 MG tablet Commonly known as:  PROTONIX   predniSONE 10 MG tablet Commonly known as:  DELTASONE     TAKE these medications   ACCU-CHEK AVIVA PLUS test strip Generic drug:  glucose blood TEST BLOOD SUGAR DAILY AS DIRECTED   albuterol 108 (90 Base) MCG/ACT inhaler Commonly known as:  PROVENTIL HFA;VENTOLIN HFA Inhale 1-2 puffs into the lungs every 6 (six) hours as needed for wheezing or shortness of breath.   allopurinol 300 MG tablet Commonly known as:  ZYLOPRIM TAKE 1 TABLET BY MOUTH EVERY DAY   aspirin EC 81 MG tablet Take 81 mg by mouth daily. Reported on 04/10/2016   atorvastatin 80 MG tablet Commonly known as:  LIPITOR TAKE 1 TABLET BY MOUTH AT BEDTIME   carvedilol 12.5 MG tablet Commonly  known as:  COREG Take 1 tablet (12.5 mg total) by mouth 2 (two) times daily with a meal. What changed:    medication strength  See the new instructions.   clopidogrel 75 MG tablet Commonly known as:  PLAVIX Take 75 mg by mouth daily.   furosemide 40 MG tablet Commonly known as:  LASIX Take 1 tablet (40 mg total) by mouth 2 (two) times daily.   glipiZIDE 10 MG 24 hr tablet Commonly known as:  GLUCOTROL XL Take 1 tablet (10 mg total) by mouth daily.   isosorbide mononitrate 30 MG 24 hr tablet Commonly known as:  IMDUR Take 1 tablet (30 mg total) by mouth daily.   JARDIANCE 25 MG Tabs tablet Generic drug:  empagliflozin TAKE 1 TABLET BY MOUTH DAILY   KOMBIGLYZE XR 2.04-999 MG Tb24 Generic drug:  Saxagliptin-Metformin TAKE 2 TABLETS BY MOUTH DAILY   lisinopril 5 MG tablet Commonly known as:  PRINIVIL,ZESTRIL Take 1 tablet (5 mg total) by mouth daily.   meloxicam 15 MG tablet Commonly known as:  MOBIC Take 1 tablet (15 mg total) by mouth daily. What changed:  See the new instructions.   nitroGLYCERIN 0.4 MG SL tablet Commonly known as:  NITROSTAT  Place 1 tablet (0.4 mg total) under the tongue every 5 (five) minutes as needed for chest pain.   potassium chloride SA 20 MEQ tablet Commonly known as:  K-DUR,KLOR-CON Take 1 tablet (20 mEq total) by mouth daily.         Diet and Activity recommendation: See Discharge Instructions above   Consults obtained -cardiology, critical care   Major procedures and Radiology Reports - PLEASE review detailed and final reports for all details, in brief -      Dg Chest 2 View  Result Date: 10/21/2017 CLINICAL DATA:  Shortness of breath, cough, wheezing, chest congestion EXAM: CHEST  2 VIEW COMPARISON:  02/13/2017 FINDINGS: Prior CABG. There is hyperinflation of the lungs compatible with COPD. Chronic interstitial prominence throughout the lungs. Scarring in the lung bases. No effusions or confluent airspace opacities. Heart  is normal size. IMPRESSION: COPD/chronic changes.  No active disease. Electronically Signed   By: Charlett Nose M.D.   On: 10/21/2017 10:22   Dg Chest Port 1 View  Result Date: 10/30/2017 CLINICAL DATA:  Acute respiratory failure. EXAM: PORTABLE CHEST 1 VIEW COMPARISON:  Chest x-ray from yesterday. FINDINGS: Stable cardiomediastinal silhouette. Pulmonary vascular congestion and basilar predominant interstitial and alveolar opacities, improved when compared to prior study. No large pleural effusion. No pneumothorax. No acute osseous abnormality. Prior median sternotomy. IMPRESSION: Improved pulmonary vascular congestion and edema. Electronically Signed   By: Obie Dredge M.D.   On: 10/30/2017 08:27   Dg Chest Portable 1 View  Result Date: 10/29/2017 CLINICAL DATA:  Respiratory distress.  Shortness breath. EXAM: PORTABLE CHEST 1 VIEW COMPARISON:  10/21/2017 FINDINGS: Post median sternotomy. Interval cardiac enlargement. Development of diffuse interstitial and peribronchial thickening. Possible right pleural effusion. No pneumothorax or confluent airspace disease. IMPRESSION: Increase cardiomegaly with moderate pulmonary edema and possible right pleural effusion, consistent with CHF. Electronically Signed   By: Rubye Oaks M.D.   On: 10/29/2017 05:40    Micro Results     Recent Results (from the past 240 hour(s))  MRSA PCR Screening     Status: None   Collection Time: 10/29/17  8:43 AM  Result Value Ref Range Status   MRSA by PCR NEGATIVE NEGATIVE Final    Comment:        The GeneXpert MRSA Assay (FDA approved for NASAL specimens only), is one component of a comprehensive MRSA colonization surveillance program. It is not intended to diagnose MRSA infection nor to guide or monitor treatment for MRSA infections.        Today   Subjective:   Gary Gutierrez today has no headache,no chest abdominal pain,no new weakness tingling or numbness, feels much better wants to go home  today.  Objective:   Blood pressure 109/74, pulse 100, temperature (!) 97.5 F (36.4 C), temperature source Oral, resp. rate 18, height 5\' 10"  (1.778 m), weight 84 kg (185 lb 1.6 oz), SpO2 95 %.  No intake or output data in the 24 hours ending 11/03/17 0917  Exam Awake Alert, Oriented x 3, No new F.N deficits, Normal affect [place].AT,PERRAL Supple Neck,No JVD, No cervical lymphadenopathy appriciated.  Symmetrical Chest wall movement, Good air movement bilaterally, CTAB RRR,No Gallops,Rubs or new Murmurs, No Parasternal Heave +ve B.Sounds, Abd Soft, Non tender, No organomegaly appriciated, No rebound -guarding or rigidity. No Cyanosis, Clubbing or edema, No new Rash or bruise  Data Review   CBC w Diff:  Lab Results  Component Value Date   WBC 16.0 (H) 10/29/2017   HGB 17.1  10/29/2017   HGB 15.9 10/10/2017   HCT 52.7 (H) 10/29/2017   HCT 48.0 10/10/2017   PLT 372 10/29/2017   PLT 174 10/10/2017    CMP:  Lab Results  Component Value Date   NA 138 10/30/2017   NA 141 10/10/2017   NA 142 10/11/2014   K 3.7 10/30/2017   K 4.1 10/11/2014   CL 102 10/30/2017   CL 107 10/11/2014   CO2 27 10/30/2017   CO2 27 10/11/2014   BUN 30 (H) 10/30/2017   BUN 18 10/10/2017   BUN 16 10/11/2014   CREATININE 0.98 10/30/2017   CREATININE 1.19 10/11/2014   PROT 7.4 10/29/2017   PROT 7.0 10/10/2017   ALBUMIN 3.8 10/29/2017   ALBUMIN 4.4 10/10/2017   BILITOT 0.9 10/29/2017   BILITOT 0.7 10/10/2017   ALKPHOS 109 10/29/2017   AST 49 (H) 10/29/2017   ALT 53 10/29/2017  .   Total Time in preparing paper work, data evaluation and todays exam - 35 minutes  [name] M.D on 10/30/2017 at 9:17 AM    Note: This dictation was prepared with Dragon dictation along with smaller phrase technology. Any transcriptional errors that result from this process are unintentional.

## 2017-11-04 ENCOUNTER — Telehealth: Payer: Self-pay

## 2017-11-04 NOTE — Telephone Encounter (Signed)
Patient called stating he is currently taking 40 mg of Lasix, and Lisinopril for BP. Wants to know can these together cause his BP to drop low. He states his BP is running at 90/60 and he does not feel well and feels drowsy aftter taking both medications. He feels fine until taking meds. Please Advise.

## 2017-11-04 NOTE — Telephone Encounter (Signed)
Patient informed. 

## 2017-11-04 NOTE — Telephone Encounter (Signed)
Tell him to space out the medication - take lasix in the morning and late evening and take the lisinopril in between.  Call Dr. Juliann Paresallwood for further instructions regarding reduction in either dose.

## 2017-11-07 ENCOUNTER — Ambulatory Visit (INDEPENDENT_AMBULATORY_CARE_PROVIDER_SITE_OTHER): Payer: Medicare Other | Admitting: Internal Medicine

## 2017-11-07 ENCOUNTER — Encounter: Payer: Self-pay | Admitting: Internal Medicine

## 2017-11-07 ENCOUNTER — Other Ambulatory Visit
Admission: RE | Admit: 2017-11-07 | Discharge: 2017-11-07 | Disposition: A | Discharge status: Home / Self Care | Payer: Medicare Other | Source: Ambulatory Visit | Attending: Internal Medicine | Admitting: Internal Medicine

## 2017-11-07 VITALS — BP 100/70 | HR 84 | Ht 69.5 in | Wt 187.0 lb

## 2017-11-07 DIAGNOSIS — E1165 Type 2 diabetes mellitus with hyperglycemia: Secondary | ICD-10-CM

## 2017-11-07 DIAGNOSIS — I509 Heart failure, unspecified: Secondary | ICD-10-CM | POA: Diagnosis not present

## 2017-11-07 DIAGNOSIS — I10 Essential (primary) hypertension: Secondary | ICD-10-CM

## 2017-11-07 DIAGNOSIS — IMO0001 Reserved for inherently not codable concepts without codable children: Secondary | ICD-10-CM

## 2017-11-07 DIAGNOSIS — I25119 Atherosclerotic heart disease of native coronary artery with unspecified angina pectoris: Secondary | ICD-10-CM

## 2017-11-07 LAB — BASIC METABOLIC PANEL
Anion gap: 12 (ref 5–15)
BUN: 38 mg/dL — ABNORMAL HIGH (ref 6–20)
CALCIUM: 9.5 mg/dL (ref 8.9–10.3)
CHLORIDE: 92 mmol/L — AB (ref 101–111)
CO2: 26 mmol/L (ref 22–32)
CREATININE: 1.46 mg/dL — AB (ref 0.61–1.24)
GFR, EST AFRICAN AMERICAN: 56 mL/min — AB (ref >60)
GFR, EST NON AFRICAN AMERICAN: 48 mL/min — AB (ref >60)
Glucose, Bld: 152 mg/dL — ABNORMAL HIGH (ref 65–99)
Potassium: 4.4 mmol/L (ref 3.5–5.1)
SODIUM: 130 mmol/L — AB (ref 135–145)

## 2017-11-07 NOTE — Progress Notes (Addendum)
Date:  11/07/2017   Name:  Gary Gutierrez   DOB:  08-23-1951   MRN:  [id number]   Chief Complaint: Hospitalization Follow-up HPI CHF - admitted to [facility] 10/29/17 to 10/30/17. patient with history of coronary artery disease, PCI, diabetes mellitus type 2, hypertension, hyperlipidemia came in because of shortness of breath, orthopnea, PND, pedal edema.  Admitted to ICU for acute on chronic systolic heart failure, patient required BiPAP transiently initially. ECHO showed EF 25%.  Diuresed fluid and improved.  Troponin was consistent with demand ischemia. Coreg dose reduced, furosemide 40 mg bid added.  ACE continued at discharge but he stopped it since due to low BP.  Wt Readings from Last 3 Encounters:  11/07/17 187 lb (84.8 kg)  10/30/17 185 lb 1.6 oz (84 kg)  10/24/17 200 lb 6.4 oz (90.9 kg)   DM - glucoses are running in the 180-200 range. He is not eating much.  He continues on the same medications.    Review of Systems  Constitutional: Positive for unexpected weight change. Negative for chills, fatigue and fever.  Eyes: Negative for visual disturbance.  Respiratory: Positive for cough. Negative for chest tightness, shortness of breath and wheezing.   Cardiovascular: Negative for chest pain, palpitations and leg swelling.    Patient Active Problem List   Diagnosis Date Noted  . CHF (congestive heart failure) (HCC) 10/29/2017  . Diabetic autonomic neuropathy associated with type 2 diabetes mellitus (HCC) 07/01/2017  . DJD (degenerative joint disease), lumbosacral 07/01/2017  . PAD (peripheral artery disease) (HCC) 06/30/2017  . DOE (dyspnea on exertion) 04/10/2017  . Tobacco use disorder, mild, in sustained remission, abuse 04/10/2017  . Uncontrolled type 2 diabetes mellitus without complication, without long-term current use of insulin (HCC) 08/12/2016  . Hx of colonic polyps 04/10/2016  . Hearing loss of both ears 01/03/2016  . Acid reflux 06/09/2015  . Mixed  hyperlipidemia 06/09/2015  . Essential hypertension 06/09/2015  . Controlled gout 06/09/2015  . Multilevel degenerative disc disease 06/09/2015  . Coronary artery disease involving native coronary artery of native heart with angina pectoris (HCC) 05/09/2014  . Coronary artery disease involving autologous vein bypass graft 05/09/2014    Prior to Admission medications   Medication Sig Start Date End Date Taking? Authorizing Provider  ACCU-CHEK AVIVA PLUS test strip TEST BLOOD SUGAR DAILY AS DIRECTED 11/03/16  Yes [name], MD  albuterol (PROVENTIL HFA;VENTOLIN HFA) 108 (90 Base) MCG/ACT inhaler Inhale 1-2 puffs into the lungs every 6 (six) hours as needed for wheezing or shortness of breath. 10/21/17  Yes Cook, Jayce G, DO  allopurinol (ZYLOPRIM) 300 MG tablet TAKE 1 TABLET BY MOUTH EVERY DAY 06/18/17  Yes [name], MD  aspirin EC 81 MG tablet Take 81 mg by mouth daily. Reported on 04/10/2016   Yes [provider]  atorvastatin (LIPITOR) 80 MG tablet TAKE 1 TABLET BY MOUTH AT BEDTIME 10/30/17  Yes [name], MD  carvedilol (COREG) 12.5 MG tablet Take 1 tablet (12.5 mg total) by mouth 2 (two) times daily with a meal. 10/30/17  Yes [name], MD  clopidogrel (PLAVIX) 75 MG tablet Take 75 mg by mouth daily.   Yes [provider]  furosemide (LASIX) 40 MG tablet Take 1 tablet (40 mg total) by mouth 2 (two) times daily. 10/30/17 10/30/18 Yes [name], MD  glipiZIDE (GLUCOTROL XL) 10 MG 24 hr tablet Take 1 tablet (10 mg total) by mouth daily. 06/11/17  Yes [name], MD  isosorbide mononitrate (IMDUR) 30 MG 24 hr tablet Take 1 tablet (30 mg total) by mouth daily. 02/13/17 02/13/18 Yes [name], [name], MD  JARDIANCE 25 MG TABS tablet TAKE 1 TABLET BY MOUTH DAILY 06/18/17  Yes Reubin MilanBerglund, [name], MD  KOMBIGLYZE XR 2.04-999 MG TB24 TAKE 2 TABLETS BY MOUTH DAILY 10/20/17  Yes Reubin MilanBerglund, [name], MD  meloxicam (MOBIC) 15 MG tablet Take 1 tablet  (15 mg total) by mouth daily. 10/31/17  Yes Katha HammingKonidena, [name], MD  nitroGLYCERIN (NITROSTAT) 0.4 MG SL tablet Place 1 tablet (0.4 mg total) under the tongue every 5 (five) minutes as needed for chest pain. 02/04/17  Yes Katha HammingKonidena, [name], MD  pantoprazole (PROTONIX) 40 MG tablet Take 40 mg daily by mouth. 09/11/17  Yes [provider]  potassium chloride SA (K-DUR,KLOR-CON) 20 MEQ tablet Take 1 tablet (20 mEq total) by mouth daily. 10/31/17  Yes Katha HammingKonidena, [name], MD  lisinopril (PRINIVIL,ZESTRIL) 5 MG tablet Take 1 tablet (5 mg total) by mouth daily. Patient not taking: Reported on 11/07/2017 10/10/17   Reubin MilanBerglund, [name], MD    Allergies  Allergen Reactions  . Penicillins Other (See Comments)    Other reaction(s): Unknown, pt does not know reactions    Past Surgical History:  Procedure Laterality Date  . APPENDECTOMY    . colonscopy  2010   benign polyps  . CORONARY ARTERY BYPASS GRAFT  1995  . ESOPHAGOGASTRODUODENOSCOPY  2010   normal  . PERCUTANEOUS CORONARY STENT INTERVENTION (PCI-S)  2010, 2015   4 stents total    Social History   Tobacco Use  . Smoking status: Former Smoker    Types: Cigarettes    Last attempt to quit: 12/30/1994    Years since quitting: 22.8  . Smokeless tobacco: Never Used  Substance Use Topics  . Alcohol use: No    Alcohol/week: 0.0 oz  . Drug use: No     Medication list has been reviewed and updated.  PHQ 2/9 Scores 06/10/2017 08/12/2016 07/27/2015  PHQ - 2 Score 0 0 0    Physical Exam  Constitutional: He is oriented to person, place, and time. He appears well-developed. No distress.  HENT:  Head: Normocephalic and atraumatic.  Neck: Normal range of motion. Neck supple. No hepatojugular reflux present. Carotid bruit is not present.  Cardiovascular: Normal rate, regular rhythm and normal heart sounds.  Pulmonary/Chest: Effort normal and breath sounds normal. No respiratory distress. He has no wheezes.  Abdominal: Soft. Normal  appearance and bowel sounds are normal. He exhibits no distension and no fluid wave. There is no tenderness.  Musculoskeletal: Normal range of motion.  Neurological: He is alert and oriented to person, place, and time.  Skin: Skin is warm and dry. No rash noted.  Psychiatric: He has a normal mood and affect. His speech is normal and behavior is normal. Thought content normal.  Nursing note and vitals reviewed.   BP 100/70   Pulse 84   Ht 5' 9.5" (1.765 m)   Wt 187 lb (84.8 kg)   SpO2 98%   BMI 27.22 kg/m   Assessment and Plan: 1. Acute congestive heart failure, unspecified heart failure type (HCC) Improving but with low BP Check labs Discuss med adjustments with Cardiology on Monday - Basic metabolic panel  2. Coronary artery disease involving native coronary artery of native heart with angina pectoris (HCC) Followed by Dr. Juliann Paresallwood  3. Essential hypertension Continue coreg, lasix  4. Uncontrolled type 2 diabetes mellitus without complication, without long-term current use  of insulin (HCC) Continue same medications.  No orders of the defined types were placed in this encounter.  Lab Results  Component Value Date   CREATININE 1.46 (H) 11/07/2017   BUN 38 (H) 11/07/2017   NA 130 (L) 11/07/2017   K 4.4 11/07/2017   CL 92 (L) 11/07/2017   CO2 26 11/07/2017   The above labs returned after his visit today.  He was called and instructed to reduce lasix to 1/2 tablet twice a day and liberalize fluids to 60 oz per day. He will follow up with cardiology on Monday.  Partially dictated using Animal nutritionistDragon software. Any errors are unintentional.  Bari EdwardLaura Berglund, MD [facility]  11/07/2017

## 2017-11-07 NOTE — Patient Instructions (Signed)
Continue same medications until you are seen by Dr. Juliann Paresallwood next week.

## 2017-11-08 ENCOUNTER — Other Ambulatory Visit: Payer: Self-pay | Admitting: Family Medicine

## 2017-11-10 ENCOUNTER — Ambulatory Visit: Payer: Medicare Other | Attending: Family | Admitting: Family

## 2017-11-10 ENCOUNTER — Other Ambulatory Visit: Payer: Self-pay

## 2017-11-10 ENCOUNTER — Encounter: Payer: Self-pay | Admitting: Family

## 2017-11-10 VITALS — BP 125/80 | HR 88 | Resp 18 | Ht 69.0 in | Wt 190.2 lb

## 2017-11-10 DIAGNOSIS — E119 Type 2 diabetes mellitus without complications: Secondary | ICD-10-CM | POA: Insufficient documentation

## 2017-11-10 DIAGNOSIS — Z79899 Other long term (current) drug therapy: Secondary | ICD-10-CM | POA: Insufficient documentation

## 2017-11-10 DIAGNOSIS — Z955 Presence of coronary angioplasty implant and graft: Secondary | ICD-10-CM | POA: Diagnosis not present

## 2017-11-10 DIAGNOSIS — Z87891 Personal history of nicotine dependence: Secondary | ICD-10-CM | POA: Diagnosis not present

## 2017-11-10 DIAGNOSIS — I5022 Chronic systolic (congestive) heart failure: Secondary | ICD-10-CM

## 2017-11-10 DIAGNOSIS — I2510 Atherosclerotic heart disease of native coronary artery without angina pectoris: Secondary | ICD-10-CM | POA: Diagnosis not present

## 2017-11-10 DIAGNOSIS — I10 Essential (primary) hypertension: Secondary | ICD-10-CM | POA: Diagnosis not present

## 2017-11-10 DIAGNOSIS — I509 Heart failure, unspecified: Secondary | ICD-10-CM | POA: Insufficient documentation

## 2017-11-10 DIAGNOSIS — Z7982 Long term (current) use of aspirin: Secondary | ICD-10-CM | POA: Diagnosis not present

## 2017-11-10 DIAGNOSIS — I208 Other forms of angina pectoris: Secondary | ICD-10-CM | POA: Diagnosis not present

## 2017-11-10 DIAGNOSIS — I429 Cardiomyopathy, unspecified: Secondary | ICD-10-CM | POA: Diagnosis not present

## 2017-11-10 DIAGNOSIS — E1143 Type 2 diabetes mellitus with diabetic autonomic (poly)neuropathy: Secondary | ICD-10-CM

## 2017-11-10 DIAGNOSIS — I110 Hypertensive heart disease with heart failure: Secondary | ICD-10-CM | POA: Insufficient documentation

## 2017-11-10 DIAGNOSIS — E7849 Other hyperlipidemia: Secondary | ICD-10-CM | POA: Diagnosis not present

## 2017-11-10 DIAGNOSIS — R0602 Shortness of breath: Secondary | ICD-10-CM | POA: Diagnosis not present

## 2017-11-10 DIAGNOSIS — Z951 Presence of aortocoronary bypass graft: Secondary | ICD-10-CM | POA: Diagnosis not present

## 2017-11-10 DIAGNOSIS — Z9889 Other specified postprocedural states: Secondary | ICD-10-CM | POA: Insufficient documentation

## 2017-11-10 DIAGNOSIS — M1990 Unspecified osteoarthritis, unspecified site: Secondary | ICD-10-CM | POA: Diagnosis not present

## 2017-11-10 DIAGNOSIS — Z880 Allergy status to penicillin: Secondary | ICD-10-CM | POA: Insufficient documentation

## 2017-11-10 DIAGNOSIS — E669 Obesity, unspecified: Secondary | ICD-10-CM | POA: Diagnosis not present

## 2017-11-10 DIAGNOSIS — Z7689 Persons encountering health services in other specified circumstances: Secondary | ICD-10-CM | POA: Diagnosis not present

## 2017-11-10 DIAGNOSIS — K219 Gastro-esophageal reflux disease without esophagitis: Secondary | ICD-10-CM | POA: Diagnosis not present

## 2017-11-10 NOTE — Patient Instructions (Signed)
Continue weighing daily and call for an overnight weight gain of > 2 pounds or a weekly weight gain of >5 pounds. 

## 2017-11-10 NOTE — Progress Notes (Signed)
Patient ID: Gary Gutierrez, male    DOB: 10/02/1951, 66 y.o.   MRN: [id number]  HPI  Mr Gary Gutierrez is a 66 y/o male with a history of CAD, DM, hyperlipidemia, HTN, remote tobacco use and chronic heart failure.   Echo report from 10/29/17 reviewed and shows an EF of 20-25%. EF has declined from previous echo February 2018. Cardiac catheterization done on 02/03/17 showed multivessel disease. Successful PCI with DES from SVG to circumflex.    Admitted 10/29/17 due to HF. Initially needed bipap along with IV lasix. Cardiology consult was obtained. Medications were adjusted and he was discharged the next day. Was in the ED 10/21/17 due to COPD exacerbation where he was treated and released.   He presents today for his initial visit with a chief complaint of minimal fatigue upon moderate exertion. He describes this as chronic in nature having been present for several months. He has associated shortness of breath along with this. He denies chest pain, edema, palpitations, dizziness, difficulty sleeping or weight gain.   Past Medical History:  Diagnosis Date  . Allergy   . CHF (congestive heart failure) (HCC)   . Coronary artery disease   . Diabetes mellitus without complication (HCC)   . Hyperlipidemia   . Hypertension    Past Surgical History:  Procedure Laterality Date  . APPENDECTOMY    . colonscopy  2010   benign polyps  . CORONARY ARTERY BYPASS GRAFT  1995  . ESOPHAGOGASTRODUODENOSCOPY  2010   normal  . PERCUTANEOUS CORONARY STENT INTERVENTION (PCI-S)  2010, 2015   4 stents total   Family History  Problem Relation Age of Onset  . Diabetes Father   . Diabetes Brother   . Prostate cancer Brother   . Lung cancer Brother    Social History   Tobacco Use  . Smoking status: Former Smoker    Types: Cigarettes    Last attempt to quit: 12/30/1994    Years since quitting: 22.8  . Smokeless tobacco: Never Used  Substance Use Topics  . Alcohol use: No    Alcohol/week: 0.0 oz    Allergies  Allergen Reactions  . Penicillins Other (See Comments)    Other reaction(s): Unknown, pt does not know reactions   Prior to Admission medications   Medication Sig Start Date End Date Taking? Authorizing Provider  ACCU-CHEK AVIVA PLUS test strip TEST BLOOD SUGAR DAILY AS DIRECTED 11/03/16  Yes [name], MD  carvedilol (COREG) 12.5 MG tablet Take 1 tablet (12.5 mg total) by mouth 2 (two) times daily with a meal. 10/30/17  Yes [name], MD  clopidogrel (PLAVIX) 75 MG tablet Take 75 mg by mouth daily.   Yes [provider]  furosemide (LASIX) 40 MG tablet Take 20 mg 2 (two) times daily by mouth.   Yes [provider]  potassium chloride SA (K-DUR,KLOR-CON) 20 MEQ tablet Take 1 tablet (20 mEq total) by mouth daily. 10/31/17  Yes [name], MD  albuterol (PROVENTIL HFA;VENTOLIN HFA) 108 (90 Base) MCG/ACT inhaler Inhale 1-2 puffs into the lungs every 6 (six) hours as needed for wheezing or shortness of breath. 10/21/17   [name], DO  allopurinol (ZYLOPRIM) 300 MG tablet TAKE 1 TABLET BY MOUTH EVERY DAY 06/18/17   [name], MD  aspirin EC 81 MG tablet Take 81 mg by mouth daily. Reported on 04/10/2016    [provider]  atorvastatin (LIPITOR) 80 MG tablet TAKE 1 TABLET BY MOUTH AT BEDTIME 10/30/17  Reubin MilanBerglund, [name], MD  glipiZIDE (GLUCOTROL XL) 10 MG 24 hr tablet Take 1 tablet (10 mg total) by mouth daily. 06/11/17   Reubin MilanBerglund, [name], MD  isosorbide mononitrate (IMDUR) 30 MG 24 hr tablet Take 1 tablet (30 mg total) by mouth daily. 02/13/17 02/13/18  [name], [name], MD  JARDIANCE 25 MG TABS tablet TAKE 1 TABLET BY MOUTH DAILY 06/18/17   Reubin MilanBerglund, [name], MD  KOMBIGLYZE XR 2.04-999 MG TB24 TAKE 2 TABLETS BY MOUTH DAILY 10/20/17   Reubin MilanBerglund, [name], MD  lisinopril (PRINIVIL,ZESTRIL) 5 MG tablet Take 1 tablet (5 mg total) by mouth daily. 10/10/17   Reubin MilanBerglund, [name], MD  meloxicam (MOBIC) 15 MG tablet Take 1 tablet (15 mg  total) by mouth daily. 10/31/17   Katha HammingKonidena, [name], MD  nitroGLYCERIN (NITROSTAT) 0.4 MG SL tablet Place 1 tablet (0.4 mg total) under the tongue every 5 (five) minutes as needed for chest pain. 02/04/17   Katha HammingKonidena, [name], MD  pantoprazole (PROTONIX) 40 MG tablet Take 40 mg daily by mouth. 09/11/17   [provider]   Review of Systems  Constitutional: Positive for fatigue (improving). Negative for appetite change.  HENT: Negative for congestion, postnasal drip and sore throat.   Eyes: Negative.   Respiratory: Positive for shortness of breath. Negative for cough and chest tightness.   Cardiovascular: Negative for chest pain, palpitations and leg swelling.  Gastrointestinal: Negative for abdominal distention and abdominal pain.  Endocrine: Negative.   Genitourinary: Negative.   Musculoskeletal: Negative for back pain and neck pain.  Skin: Negative.   Allergic/Immunologic: Negative.   Neurological: Negative for dizziness and light-headedness.  Hematological: Negative for adenopathy. Does not bruise/bleed easily.  Psychiatric/Behavioral: Negative for dysphoric mood and sleep disturbance. The patient is not nervous/anxious.    Vitals:   11/10/17 1118  BP: 125/80  Pulse: 88  Resp: 18  SpO2: 98%  Weight: 190 lb 4 oz (86.3 kg)  Height: 5\' 9"  (1.753 m)   Wt Readings from Last 3 Encounters:  11/10/17 190 lb 4 oz (86.3 kg)  11/07/17 187 lb (84.8 kg)  10/30/17 185 lb 1.6 oz (84 kg)   Lab Results  Component Value Date   CREATININE 1.46 (H) 11/07/2017   CREATININE 0.98 10/30/2017   CREATININE 1.20 10/29/2017   Physical Exam  Constitutional: He is oriented to person, place, and time. He appears well-developed and well-nourished.  HENT:  Head: Normocephalic and atraumatic.  Neck: Normal range of motion. Neck supple. No JVD present.  Cardiovascular: Normal rate and regular rhythm.  Pulmonary/Chest: Effort normal. No respiratory distress. He has no wheezes. He has no rales.   Abdominal: Soft. He exhibits no distension. There is no tenderness.  Musculoskeletal: He exhibits no edema or tenderness.  Neurological: He is alert and oriented to person, place, and time.  Skin: Skin is warm and dry.  Psychiatric: He has a normal mood and affect. His behavior is normal. Thought content normal.  Nursing note and vitals reviewed.  Assessment & Plan:  1: Chronic heart failure with reduced ejection fraction- - NYHA class II - euvolemic today - already weighing daily and says that his weight has been stable. Discussed calling for an overnight weight gain of >2 pounds or a weekly weight gain of >5 pounds - not adding salt and they have been closely reading food labels. Discussed closely following a 2000mg  sodium diet and written dietary information was given to him about this.  - saw cardiologist Juliann Pares([place]) earlier today and returns on  12/10/17 - BMP from 11/07/17 reviewed and showed sodium 130, potassium 4.4 and GFR 48 - patient reports receiving his flu vaccine for the season already  2: HTN- - BP looks good today - discussed changing his lisinopril to entresto if his BP allows - saw PCP [name]([place]) 11/07/17  3: Diabetes- - glucose at home this morning was 143 - A1c on 10/10/17 was 6.8%  Patient did not bring his medications nor a list and is unable to state all the medications that he's taking. Discussed the importance of bringing medications to appointments so that all providers can know exactly what he's taking.  Return in 2 months or sooner for any questions/problems before then.

## 2017-11-23 ENCOUNTER — Other Ambulatory Visit: Payer: Self-pay | Admitting: Internal Medicine

## 2017-11-24 ENCOUNTER — Ambulatory Visit: Payer: Medicare Other | Attending: Family | Admitting: Family

## 2017-11-24 ENCOUNTER — Encounter: Payer: Self-pay | Admitting: Family

## 2017-11-24 ENCOUNTER — Other Ambulatory Visit: Payer: Self-pay

## 2017-11-24 ENCOUNTER — Telehealth: Payer: Self-pay

## 2017-11-24 ENCOUNTER — Ambulatory Visit: Payer: Medicare Other | Admitting: Internal Medicine

## 2017-11-24 VITALS — BP 133/89 | HR 90 | Resp 18 | Ht 70.0 in | Wt 197.0 lb

## 2017-11-24 DIAGNOSIS — Z7982 Long term (current) use of aspirin: Secondary | ICD-10-CM | POA: Diagnosis not present

## 2017-11-24 DIAGNOSIS — Z9889 Other specified postprocedural states: Secondary | ICD-10-CM | POA: Diagnosis not present

## 2017-11-24 DIAGNOSIS — I10 Essential (primary) hypertension: Secondary | ICD-10-CM

## 2017-11-24 DIAGNOSIS — I110 Hypertensive heart disease with heart failure: Secondary | ICD-10-CM | POA: Diagnosis not present

## 2017-11-24 DIAGNOSIS — Z79899 Other long term (current) drug therapy: Secondary | ICD-10-CM | POA: Insufficient documentation

## 2017-11-24 DIAGNOSIS — I509 Heart failure, unspecified: Secondary | ICD-10-CM | POA: Diagnosis not present

## 2017-11-24 DIAGNOSIS — E119 Type 2 diabetes mellitus without complications: Secondary | ICD-10-CM | POA: Insufficient documentation

## 2017-11-24 DIAGNOSIS — E1143 Type 2 diabetes mellitus with diabetic autonomic (poly)neuropathy: Secondary | ICD-10-CM

## 2017-11-24 DIAGNOSIS — I5021 Acute systolic (congestive) heart failure: Secondary | ICD-10-CM | POA: Insufficient documentation

## 2017-11-24 DIAGNOSIS — Z880 Allergy status to penicillin: Secondary | ICD-10-CM | POA: Diagnosis not present

## 2017-11-24 DIAGNOSIS — Z87891 Personal history of nicotine dependence: Secondary | ICD-10-CM | POA: Diagnosis not present

## 2017-11-24 LAB — BASIC METABOLIC PANEL
ANION GAP: 10 (ref 5–15)
BUN: 22 mg/dL — ABNORMAL HIGH (ref 6–20)
CHLORIDE: 104 mmol/L (ref 101–111)
CO2: 26 mmol/L (ref 22–32)
Calcium: 9.2 mg/dL (ref 8.9–10.3)
Creatinine, Ser: 1.13 mg/dL (ref 0.61–1.24)
GFR calc Af Amer: >60 mL/min (ref >60)
GFR calc non Af Amer: >60 mL/min (ref >60)
GLUCOSE: 143 mg/dL — AB (ref 65–99)
POTASSIUM: 3.9 mmol/L (ref 3.5–5.1)
Sodium: 140 mmol/L (ref 135–145)

## 2017-11-24 MED ORDER — TORSEMIDE 20 MG PO TABS: 40.0000 mg | ORAL_TABLET | Freq: Two times a day (BID) | ORAL | 3 refills | Status: DC

## 2017-11-24 NOTE — Telephone Encounter (Signed)
Patient contacted the office this morning due to sob and weight gain. Patient is currently taking 40 mg twice daily of Furosemide.  BP  143/79 today. He was taking his Furosemide 20 mg twice a day, than four days ago his weight was 187. He increased his Furosemide to 20 mg in the AM and 40 mg that evening. The next day his weight decreased to 182. He then increased the furosemide to 40 mg twice daily, his weight the next day then went back  up to 188 then again increased up to 190 today. He is experiencing shortness of breath, and wasn't sure what he should do but wanted to make us aware.   Please advise.

## 2017-11-24 NOTE — Patient Instructions (Addendum)
Continue weighing daily and call for an overnight weight gain of > 2 pounds or a weekly weight gain of >5 pounds.  Drink between 40-60 ounces of fluid daily.   Switching your fluid pill to torsemide 40mg  twice daily. Stop taking the furosemide with starting the new fluid pill

## 2017-11-24 NOTE — Progress Notes (Signed)
Patient ID: Gary Gutierrez, male    DOB: 1951/12/02, 66 y.o.   MRN: [id number]  HPI  Mr Frederich Charaughber is a 66 y/o male with a history of CAD, DM, hyperlipidemia, HTN, remote tobacco use and chronic heart failure.   Echo report from 10/29/17 reviewed and shows an EF of 20-25%. EF has declined from previous echo February 2018. Cardiac catheterization done on 02/03/17 showed multivessel disease. Successful PCI with DES from SVG to circumflex.    Admitted 10/29/17 due to HF. Initially needed bipap along with IV lasix. Cardiology consult was obtained. Medications were adjusted and he was discharged the next day. Was in the ED 10/21/17 due to COPD exacerbation where he was treated and released.   He presents today for a follow-up visit with a chief complaint of minimal shortness of breath upon moderate exertion. He describes this as chronic in nature having been present for several years with varying levels of severity. He has associated fatigue, abdominal distention and weight gain along with this. He denies any chest pain, edema, palpitations, dizziness or difficulty sleeping. Admits to dietary indiscretion over Thanksgiving holiday.   Past Medical History:  Diagnosis Date  . Allergy   . CHF (congestive heart failure) (HCC)   . Coronary artery disease   . Diabetes mellitus without complication (HCC)   . Hyperlipidemia   . Hypertension    Past Surgical History:  Procedure Laterality Date  . APPENDECTOMY    . colonscopy  2010   benign polyps  . CORONARY ARTERY BYPASS GRAFT  1995  . CORONARY STENT INTERVENTION N/A 02/03/2017   Procedure: Coronary Stent Intervention;  Surgeon: Alwyn Peawayne D [place], MD;  Location: [facility] IN[facility]IVE [facility];  Service: Cardiovascular;  Laterality: N/A;  . ESOPHAGOGASTRODUODENOSCOPY  2010   normal  . LEFT HEART CATH AND CORONARY ANGIOGRAPHY N/A 02/03/2017   Procedure: Left Heart Cath and Coronary Angiography;  Surgeon: Alwyn Peawayne D [place], MD;  Location: [facility];   Service: Cardiovascular;  Laterality: N/A;  . PERCUTANEOUS CORONARY STENT INTERVENTION (PCI-S)  2010, 2015   4 stents total   Family History  Problem Relation Age of Onset  . Diabetes Father   . Diabetes Brother   . Prostate cancer Brother   . Lung cancer Brother    Social History   Tobacco Use  . Smoking status: Former Smoker    Types: Cigarettes    Last attempt to quit: 12/30/1994    Years since quitting: 22.9  . Smokeless tobacco: Never Used  Substance Use Topics  . Alcohol use: No    Alcohol/week: 0.0 oz   Allergies  Allergen Reactions  . Penicillins Other (See Comments)    Other reaction(s): Unknown, pt does not know reactions   Prior to Admission medications   Medication Sig Start Date End Date Taking? Authorizing Provider  ACCU-CHEK AVIVA PLUS test strip TEST BLOOD SUGAR DAILY AS DIRECTED 11/03/16  Yes Reubin MilanBerglund, [name], MD  albuterol (PROVENTIL HFA;VENTOLIN HFA) 108 (90 Base) MCG/ACT inhaler Inhale 1-2 puffs into the lungs every 6 (six) hours as needed for wheezing or shortness of breath. 10/21/17  Yes Cook, Jayce G, DO  allopurinol (ZYLOPRIM) 300 MG tablet TAKE 1 TABLET BY MOUTH EVERY DAY 06/18/17  Yes Reubin MilanBerglund, [name], MD  aspirin EC 81 MG tablet Take 81 mg by mouth daily. Reported on 04/10/2016   Yes [provider]  atorvastatin (LIPITOR) 80 MG tablet TAKE 1 TABLET BY MOUTH AT BEDTIME 11/23/17  Yes Reubin MilanBerglund, [name], MD  carvedilol (COREG) 12.5 MG tablet Take 1 tablet (12.5 mg total) by mouth 2 (two) times daily with a meal. 10/30/17  Yes [name], MD  clopidogrel (PLAVIX) 75 MG tablet Take 75 mg by mouth daily.   Yes [provider]  glipiZIDE (GLUCOTROL XL) 10 MG 24 hr tablet Take 1 tablet (10 mg total) by mouth daily. 06/11/17  Yes [name], MD  isosorbide mononitrate (IMDUR) 30 MG 24 hr tablet Take 1 tablet (30 mg total) by mouth daily. 02/13/17 02/13/18 Yes Jennye Moccasin, MD  JARDIANCE 25 MG TABS tablet TAKE 1 TABLET BY MOUTH  DAILY 06/18/17  Yes [name], MD  KOMBIGLYZE XR 2.04-999 MG TB24 TAKE 2 TABLETS BY MOUTH DAILY 10/20/17  Yes [name], MD  meloxicam (MOBIC) 15 MG tablet Take 1 tablet (15 mg total) by mouth daily. 10/31/17  Yes [name], MD  nitroGLYCERIN (NITROSTAT) 0.4 MG SL tablet Place 1 tablet (0.4 mg total) under the tongue every 5 (five) minutes as needed for chest pain. 02/04/17  Yes [name], MD  pantoprazole (PROTONIX) 40 MG tablet Take 40 mg daily by mouth. 09/11/17  Yes [provider]  potassium chloride SA (K-DUR,KLOR-CON) 20 MEQ tablet Take 1 tablet (20 mEq total) by mouth daily. 10/31/17  Yes [name], MD  lisinopril (PRINIVIL,ZESTRIL) 5 MG tablet Take 1 tablet (5 mg total) by mouth daily. Patient not taking: Reported on 11/24/2017 10/10/17   [name], MD  Furosemide 40mg  tablet Take 1/2 tablets (20 mg total) by mouth 2 (two) times daily.        Review of Systems  Constitutional: Positive for fatigue (improving). Negative for appetite change.  HENT: Negative for congestion, postnasal drip and sore throat.   Eyes: Negative.   Respiratory: Positive for shortness of breath. Negative for cough and chest tightness.   Cardiovascular: Negative for chest pain, palpitations and leg swelling.  Gastrointestinal: Positive for abdominal distention. Negative for abdominal pain and constipation.  Endocrine: Negative.   Genitourinary: Negative.   Musculoskeletal: Negative for back pain and neck pain.  Skin: Negative.   Allergic/Immunologic: Negative.   Neurological: Negative for dizziness and light-headedness.  Hematological: Negative for adenopathy. Does not bruise/bleed easily.  Psychiatric/Behavioral: Negative for dysphoric mood and sleep disturbance. The patient is not nervous/anxious.    Vitals:   11/24/17 1137  BP: 133/89  Pulse: 90  Resp: 18  SpO2: 100%  Weight: 197 lb (89.4 kg)  Height: 5\' 10"  (1.778 m)   Wt Readings from  Last 3 Encounters:  11/24/17 197 lb (89.4 kg)  11/10/17 190 lb 4 oz (86.3 kg)  11/07/17 187 lb (84.8 kg)    Lab Results  Component Value Date   CREATININE 1.46 (H) 11/07/2017   CREATININE 0.98 10/30/2017   CREATININE 1.20 10/29/2017   Physical Exam  Constitutional: He is oriented to person, place, and time. He appears well-developed and well-nourished.  HENT:  Head: Normocephalic and atraumatic.  Neck: Normal range of motion. Neck supple. No JVD present.  Cardiovascular: Normal rate and regular rhythm.  Pulmonary/Chest: Effort normal. No respiratory distress. He has no wheezes. He has no rales.  Abdominal: Soft. He exhibits distension. There is no tenderness.  Musculoskeletal: He exhibits no edema or tenderness.  Neurological: He is alert and oriented to person, place, and time.  Skin: Skin is warm and dry.  Psychiatric: He has a normal mood and affect. His behavior is normal. Thought content normal.  Nursing note and vitals reviewed.  Assessment & Plan:  1: Acute heart failure with reduced ejection fraction- - NYHA class II - mildly fluid overloaded today with increased weight and abdominal distention - already weighing daily and says that his weight has risen. Discussed calling for an overnight weight gain of >2 pounds or a weekly weight gain of >5 pounds - weight up 7 pounds since he was last here - not adding salt and has been reading food labels. Discussed closely following a 2000mg  sodium diet  - does admit to eating "a lot" at Thanksgiving at a friend's house, eating leftovers and then eating a burger and fries yesterday. Discussed potential sodium content of fast food as well as eating other people's cooking when you don't know if they prepare their food with salt - saw cardiologist Juliann Pares([place]) 11/10/17 and returns on 12/10/17 - BMP from 11/07/17 reviewed and showed sodium 130, potassium 4.4 and GFR 48 - patient reports receiving his flu vaccine for the season already -  will change his diuretic to torsemide 40mg  twice daily. He was instructed to stop taking the furosemide and begin torsemide. Discussed hopefully not needing 40mg  twice daily long term.  - will get a BMP today to evaluate renal status and potassium level  2: HTN- - BP looks good today - says that his BP at home runs 90-100's/ 60-70's - could consider entresto if his BP allows - saw PCP [name]([place]) 11/07/17  3: Diabetes- - glucose at home this morning was 179 - A1c on 10/10/17 was 6.8%  Medication bottles were reviewed.  Return here in 1 week for a recheck or sooner for any questions/problems before then. Will check labs at that time since diuretic has been changed.

## 2017-11-26 ENCOUNTER — Other Ambulatory Visit: Payer: Self-pay | Admitting: Internal Medicine

## 2017-12-01 ENCOUNTER — Telehealth: Payer: Self-pay | Admitting: Family

## 2017-12-01 ENCOUNTER — Ambulatory Visit: Payer: Medicare Other | Attending: Family | Admitting: Family

## 2017-12-01 ENCOUNTER — Other Ambulatory Visit: Payer: Self-pay

## 2017-12-01 ENCOUNTER — Encounter: Payer: Self-pay | Admitting: Family

## 2017-12-01 VITALS — BP 114/77 | HR 92 | Resp 18 | Ht 69.0 in | Wt 189.5 lb

## 2017-12-01 DIAGNOSIS — Z7982 Long term (current) use of aspirin: Secondary | ICD-10-CM | POA: Diagnosis not present

## 2017-12-01 DIAGNOSIS — Z87891 Personal history of nicotine dependence: Secondary | ICD-10-CM | POA: Insufficient documentation

## 2017-12-01 DIAGNOSIS — I10 Essential (primary) hypertension: Secondary | ICD-10-CM

## 2017-12-01 DIAGNOSIS — J449 Chronic obstructive pulmonary disease, unspecified: Secondary | ICD-10-CM | POA: Diagnosis not present

## 2017-12-01 DIAGNOSIS — I110 Hypertensive heart disease with heart failure: Secondary | ICD-10-CM | POA: Insufficient documentation

## 2017-12-01 DIAGNOSIS — E785 Hyperlipidemia, unspecified: Secondary | ICD-10-CM | POA: Insufficient documentation

## 2017-12-01 DIAGNOSIS — I5022 Chronic systolic (congestive) heart failure: Secondary | ICD-10-CM | POA: Diagnosis not present

## 2017-12-01 DIAGNOSIS — Z79899 Other long term (current) drug therapy: Secondary | ICD-10-CM | POA: Diagnosis not present

## 2017-12-01 DIAGNOSIS — E1143 Type 2 diabetes mellitus with diabetic autonomic (poly)neuropathy: Secondary | ICD-10-CM

## 2017-12-01 DIAGNOSIS — I2510 Atherosclerotic heart disease of native coronary artery without angina pectoris: Secondary | ICD-10-CM | POA: Diagnosis not present

## 2017-12-01 DIAGNOSIS — E119 Type 2 diabetes mellitus without complications: Secondary | ICD-10-CM | POA: Diagnosis not present

## 2017-12-01 DIAGNOSIS — Z880 Allergy status to penicillin: Secondary | ICD-10-CM | POA: Insufficient documentation

## 2017-12-01 DIAGNOSIS — Z7984 Long term (current) use of oral hypoglycemic drugs: Secondary | ICD-10-CM | POA: Diagnosis not present

## 2017-12-01 LAB — BASIC METABOLIC PANEL
Anion gap: 15 (ref 5–15)
BUN: 33 mg/dL — ABNORMAL HIGH (ref 6–20)
CHLORIDE: 95 mmol/L — AB (ref 101–111)
CO2: 26 mmol/L (ref 22–32)
Calcium: 9.7 mg/dL (ref 8.9–10.3)
Creatinine, Ser: 1.31 mg/dL — ABNORMAL HIGH (ref 0.61–1.24)
GFR, EST NON AFRICAN AMERICAN: 55 mL/min — AB (ref >60)
Glucose, Bld: 262 mg/dL — ABNORMAL HIGH (ref 65–99)
POTASSIUM: 3.5 mmol/L (ref 3.5–5.1)
SODIUM: 136 mmol/L (ref 135–145)

## 2017-12-01 MED ORDER — NITROGLYCERIN 0.4 MG SL SUBL
0.4000 mg | SUBLINGUAL_TABLET | SUBLINGUAL | 1 refills | Status: DC | PRN
Start: 2017-12-01 — End: 2019-10-18

## 2017-12-01 NOTE — Telephone Encounter (Signed)
Reviewed lab results with patient that were drawn today (12/01/17). Potassium has continued to decline and is now 3.5. GFR has also declined slightly since beginning torsemide. He has been taking torsemide 40mg  twice daily since he was last seen here.  Advised patient to decrease his torsemide to 40mg  AM/ 20mg  PM and increase his potassium to 20meq BID (or 40meq daily).  Glucose was elevated at 262. He says that he ate raisin bran cereal with milk and coffee with creamer this morning. Did eat some chocolate last night. He does check his glucose at home and says that it was 132 fasting this morning. He will keep an eye on his glucose levels and follow-up with his PCP should they remain elevated.   Will recheck his lab work at his next visit with us if not done before then.

## 2017-12-01 NOTE — Progress Notes (Signed)
Patient ID: Gary Gutierrez, male    DOB: Jul 02, 1951, 66 y.o.   MRN: [id number]  HPI  Gary Gutierrez is a 66 y/o male with a history of CAD, DM, hyperlipidemia, HTN, remote tobacco use and chronic heart failure.   Echo report from 10/29/17 reviewed and shows an EF of 20-25%. EF has declined from previous echo February 2018. Cardiac catheterization done on 02/03/17 showed multivessel disease. Successful PCI with DES from SVG to circumflex.    Admitted 10/29/17 due to HF. Initially needed bipap along with IV lasix. Cardiology consult was obtained. Medications were adjusted and he was discharged the next day. Was in the ED 10/21/17 due to COPD exacerbation where he was treated and released.   He presents today for a follow-up visit with a chief complaint of minimal fatigue upon moderate exertion. He describes this as chronic in nature having been present for several years with varying levels of severity. He has associated shortness of breath along with this. He denies any chest pain, edema, palpitations, dizziness, difficulty sleeping or weight gain. Feels like his abdominal distention has improved quite a bit since changing his diuretic to torsemide.   Past Medical History:  Diagnosis Date  . Allergy   . CHF (congestive heart failure) (HCC)   . Coronary artery disease   . Diabetes mellitus without complication (HCC)   . Hyperlipidemia   . Hypertension    Past Surgical History:  Procedure Laterality Date  . APPENDECTOMY    . colonscopy  2010   benign polyps  . CORONARY ARTERY BYPASS GRAFT  1995  . CORONARY STENT INTERVENTION N/A 02/03/2017   Procedure: Coronary Stent Intervention;  Surgeon: Alwyn Peawayne D Callwood, MD;  Location: [facility] INVASIVE CV LAB;  Service: Cardiovascular;  Laterality: N/A;  . ESOPHAGOGASTRODUODENOSCOPY  2010   normal  . LEFT HEART CATH AND CORONARY ANGIOGRAPHY N/A 02/03/2017   Procedure: Left Heart Cath and Coronary Angiography;  Surgeon: Alwyn Peawayne D Callwood, MD;  Location: [facility]  INVASIVE CV LAB;  Service: Cardiovascular;  Laterality: N/A;  . PERCUTANEOUS CORONARY STENT INTERVENTION (PCI-S)  2010, 2015   4 stents total   Family History  Problem Relation Age of Onset  . Diabetes Father   . Diabetes Brother   . Prostate cancer Brother   . Lung cancer Brother    Social History   Tobacco Use  . Smoking status: Former Smoker    Types: Cigarettes    Last attempt to quit: 12/30/1994    Years since quitting: 22.9  . Smokeless tobacco: Never Used  Substance Use Topics  . Alcohol use: No    Alcohol/week: 0.0 oz   Allergies  Allergen Reactions  . Penicillins Other (See Comments)    Other reaction(s): Unknown, pt does not know reactions   Prior to Admission medications   Medication Sig Start Date End Date Taking? Authorizing Provider  ACCU-CHEK AVIVA PLUS test strip TEST BLOOD SUGAR DAILY AS DIRECTED 11/03/16  Yes Reubin MilanBerglund, [name], MD  albuterol (PROVENTIL HFA;VENTOLIN HFA) 108 (90 Base) MCG/ACT inhaler Inhale 1-2 puffs into the lungs every 6 (six) hours as needed for wheezing or shortness of breath. 10/21/17  Yes Cook, Jayce G, DO  allopurinol (ZYLOPRIM) 300 MG tablet TAKE 1 TABLET BY MOUTH EVERY DAY 06/18/17  Yes Reubin MilanBerglund, [name], MD  aspirin EC 81 MG tablet Take 81 mg by mouth daily. Reported on 04/10/2016   Yes [provider]  atorvastatin (LIPITOR) 80 MG tablet TAKE 1 TABLET BY MOUTH AT BEDTIME 11/23/17  Yes [name], MD  carvedilol (COREG) 12.5 MG tablet TAKE 1 TABLET(12.5 MG) BY MOUTH TWICE DAILY WITH A MEAL 11/27/17  Yes [name], MD  clopidogrel (PLAVIX) 75 MG tablet Take 75 mg by mouth daily.   Yes [provider]  glipiZIDE (GLUCOTROL XL) 10 MG 24 hr tablet Take 1 tablet (10 mg total) by mouth daily. 06/11/17  Yes [name], MD  isosorbide mononitrate (IMDUR) 30 MG 24 hr tablet Take 1 tablet (30 mg total) by mouth daily. 02/13/17 02/13/18 Yes Jennye Moccasin, MD  JARDIANCE 25 MG TABS tablet TAKE 1 TABLET BY MOUTH  DAILY 06/18/17  Yes [name], MD  KOMBIGLYZE XR 2.04-999 MG TB24 TAKE 2 TABLETS BY MOUTH DAILY 10/20/17  Yes [name], MD  meloxicam (MOBIC) 15 MG tablet Take 1 tablet (15 mg total) by mouth daily. 10/31/17  Yes [name], MD  nitroGLYCERIN (NITROSTAT) 0.4 MG SL tablet Place 1 tablet (0.4 mg total) under the tongue every 5 (five) minutes as needed for chest pain. 12/01/17  Yes Clarisa Kindred A, FNP  potassium chloride SA (K-DUR,KLOR-CON) 20 MEQ tablet TAKE 1 TABLET(20 MEQ) BY MOUTH DAILY 11/27/17  Yes [name], MD  torsemide (DEMADEX) 20 MG tablet Take 2 tablets (40 mg total) by mouth 2 (two) times daily. 11/24/17 02/22/18 Yes Hackney, Inetta Fermo A, FNP  lisinopril (PRINIVIL,ZESTRIL) 5 MG tablet Take 1 tablet (5 mg total) by mouth daily. Patient not taking: Reported on 11/24/2017 10/10/17   [name], MD  pantoprazole (PROTONIX) 40 MG tablet Take 40 mg daily by mouth. 09/11/17   [provider]    Review of Systems  Constitutional: Positive for fatigue (improving) and fever (yesterday). Negative for appetite change.  HENT: Negative for congestion, postnasal drip and sore throat.   Eyes: Negative.   Respiratory: Positive for shortness of breath. Negative for cough and chest tightness.   Cardiovascular: Negative for chest pain, palpitations and leg swelling.  Gastrointestinal: Negative for abdominal distention, abdominal pain and constipation.  Endocrine: Negative.   Genitourinary: Negative.   Musculoskeletal: Negative for back pain and neck pain.  Skin: Negative.   Allergic/Immunologic: Negative.   Neurological: Negative for dizziness and light-headedness.  Hematological: Negative for adenopathy. Does not bruise/bleed easily.  Psychiatric/Behavioral: Negative for dysphoric mood and sleep disturbance. The patient is not nervous/anxious.    Vitals:   12/01/17 0947  BP: 114/77  Pulse: 92  Resp: 18  SpO2: 97%  Weight: 189 lb 8 oz (86 kg)   Height: 5\' 9"  (1.753 m)   Wt Readings from Last 3 Encounters:  12/01/17 189 lb 8 oz (86 kg)  11/24/17 197 lb (89.4 kg)  11/10/17 190 lb 4 oz (86.3 kg)    Lab Results  Component Value Date   CREATININE 1.13 11/24/2017   CREATININE 1.46 (H) 11/07/2017   CREATININE 0.98 10/30/2017   Physical Exam  Constitutional: He is oriented to person, place, and time. He appears well-developed and well-nourished.  HENT:  Head: Normocephalic and atraumatic.  Neck: Normal range of motion. Neck supple. No JVD present.  Cardiovascular: Normal rate and regular rhythm.  Pulmonary/Chest: Effort normal. No respiratory distress. He has no wheezes. He has no rales.  Abdominal: Soft. He exhibits no distension. There is no tenderness.  Musculoskeletal: He exhibits no edema or tenderness.  Neurological: He is alert and oriented to person, place, and time.  Skin: Skin is warm and dry.  Psychiatric: He has a normal mood and  affect. His behavior is normal. Thought content normal.  Nursing note and vitals reviewed.  Assessment & Plan:  1: Chronic heart failure with reduced ejection fraction- - NYHA class II - euvolemic today - weighing daily and says that his weight has declined. Discussed calling for an overnight weight gain of >2 pounds or a weekly weight gain of >5 pounds - weight down 7.2 pounds since he was last here - not adding salt and has been reading food labels. Discussed closely following a 2000mg  sodium diet  - ate fried fish yesterday - saw cardiologist Juliann Pares(Callwood) 11/10/17 and returns on 12/10/17 - BMP from 11/24/17 reviewed and showed sodium 140, potassium 3.9 and GFR >60 - patient reports receiving his flu vaccine for the season already - changed his diuretic to torsemide at his last visit; he started off taking 40mg  twice daily and then decreased it to 20mg  twice daily. When he decreased it to 20mg  BID, he felt like he became bloated so he went back up to 40mg  BID - currently taking 20meq  potassium once a day - will get a BMP today   2: HTN- - BP looks good today - says that his BP at home runs 90-100's/ 60-70's - could consider entresto if his BP allows - saw PCP [name](Berglund) 11/07/17  3: Diabetes- - glucose at home this morning was 132 - A1c on 10/10/17 was 6.8%  Medication bottles were reviewed.  Return in 1 month or sooner for any questions/problems before then.

## 2017-12-01 NOTE — Patient Instructions (Signed)
Continue weighing daily and call for an overnight weight gain of > 2 pounds or a weekly weight gain of >5 pounds. 

## 2017-12-03 ENCOUNTER — Other Ambulatory Visit: Payer: Self-pay | Admitting: Internal Medicine

## 2017-12-13 ENCOUNTER — Other Ambulatory Visit: Payer: Self-pay | Admitting: Internal Medicine

## 2017-12-16 ENCOUNTER — Other Ambulatory Visit: Payer: Self-pay

## 2017-12-16 MED ORDER — POTASSIUM CHLORIDE CRYS ER 20 MEQ PO TBCR: 40.0000 meq | EXTENDED_RELEASE_TABLET | Freq: Every day | ORAL | 5 refills | Status: DC

## 2017-12-18 DIAGNOSIS — Z951 Presence of aortocoronary bypass graft: Secondary | ICD-10-CM | POA: Diagnosis not present

## 2017-12-18 DIAGNOSIS — K219 Gastro-esophageal reflux disease without esophagitis: Secondary | ICD-10-CM | POA: Diagnosis not present

## 2017-12-18 DIAGNOSIS — R0602 Shortness of breath: Secondary | ICD-10-CM | POA: Diagnosis not present

## 2017-12-18 DIAGNOSIS — I429 Cardiomyopathy, unspecified: Secondary | ICD-10-CM | POA: Diagnosis not present

## 2017-12-18 DIAGNOSIS — E7849 Other hyperlipidemia: Secondary | ICD-10-CM | POA: Diagnosis not present

## 2017-12-18 DIAGNOSIS — I10 Essential (primary) hypertension: Secondary | ICD-10-CM | POA: Diagnosis not present

## 2017-12-18 DIAGNOSIS — I208 Other forms of angina pectoris: Secondary | ICD-10-CM | POA: Diagnosis not present

## 2017-12-18 DIAGNOSIS — E669 Obesity, unspecified: Secondary | ICD-10-CM | POA: Diagnosis not present

## 2017-12-18 DIAGNOSIS — I2510 Atherosclerotic heart disease of native coronary artery without angina pectoris: Secondary | ICD-10-CM | POA: Diagnosis not present

## 2017-12-18 DIAGNOSIS — M1990 Unspecified osteoarthritis, unspecified site: Secondary | ICD-10-CM | POA: Diagnosis not present

## 2017-12-18 DIAGNOSIS — Z955 Presence of coronary angioplasty implant and graft: Secondary | ICD-10-CM | POA: Diagnosis not present

## 2017-12-18 DIAGNOSIS — I5022 Chronic systolic (congestive) heart failure: Secondary | ICD-10-CM | POA: Diagnosis not present

## 2018-01-06 ENCOUNTER — Other Ambulatory Visit: Payer: Self-pay | Admitting: Internal Medicine

## 2018-01-06 NOTE — Progress Notes (Deleted)
Patient ID: Gary Gutierrez, male    DOB: 03/02/1951, 67 y.o.   MRN: [id number]  HPI  Gary Gutierrez is a 67 y/o male with a history of CAD, DM, hyperlipidemia, HTN, remote tobacco use and chronic heart failure.   Echo report from 10/29/17 reviewed and shows an EF of 20-25%. EF has declined from previous echo February 2018. Cardiac catheterization done on 02/03/17 showed multivessel disease. Successful PCI with DES from SVG to circumflex.    Admitted 10/29/17 due to HF. Initially needed bipap along with IV lasix. Cardiology consult was obtained. Medications were adjusted and he was discharged the next day. Was in the ED 10/21/17 due to COPD exacerbation where he was treated and released.   He presents today for a follow-up visit with a chief complaint of    Past Medical History:  Diagnosis Date  . Allergy   . CHF (congestive heart failure) (HCC)   . Coronary artery disease   . Diabetes mellitus without complication (HCC)   . Hyperlipidemia   . Hypertension    Past Surgical History:  Procedure Laterality Date  . APPENDECTOMY    . colonscopy  2010   benign polyps  . CORONARY ARTERY BYPASS GRAFT  1995  . CORONARY STENT INTERVENTION N/A 02/03/2017   Procedure: Coronary Stent Intervention;  Surgeon: Alwyn Peawayne D Callwood, MD;  Location: [facility] INVASIVE CV LAB;  Service: Cardiovascular;  Laterality: N/A;  . ESOPHAGOGASTRODUODENOSCOPY  2010   normal  . LEFT HEART CATH AND CORONARY ANGIOGRAPHY N/A 02/03/2017   Procedure: Left Heart Cath and Coronary Angiography;  Surgeon: Alwyn Peawayne D Callwood, MD;  Location: [facility] INVASIVE CV LAB;  Service: Cardiovascular;  Laterality: N/A;  . PERCUTANEOUS CORONARY STENT INTERVENTION (PCI-S)  2010, 2015   4 stents total   Family History  Problem Relation Age of Onset  . Diabetes Father   . Diabetes Brother   . Prostate cancer Brother   . Lung cancer Brother    Social History   Tobacco Use  . Smoking status: Former Smoker    Types: Cigarettes    Last attempt  to quit: 12/30/1994    Years since quitting: 23.0  . Smokeless tobacco: Never Used  Substance Use Topics  . Alcohol use: No    Alcohol/week: 0.0 oz   Allergies  Allergen Reactions  . Penicillins Other (See Comments)    Other reaction(s): Unknown, pt does not know reactions     Review of Systems  Constitutional: Positive for fatigue (improving) and fever (yesterday). Negative for appetite change.  HENT: Negative for congestion, postnasal drip and sore throat.   Eyes: Negative.   Respiratory: Positive for shortness of breath. Negative for cough and chest tightness.   Cardiovascular: Negative for chest pain, palpitations and leg swelling.  Gastrointestinal: Negative for abdominal distention, abdominal pain and constipation.  Endocrine: Negative.   Genitourinary: Negative.   Musculoskeletal: Negative for back pain and neck pain.  Skin: Negative.   Allergic/Immunologic: Negative.   Neurological: Negative for dizziness and light-headedness.  Hematological: Negative for adenopathy. Does not bruise/bleed easily.  Psychiatric/Behavioral: Negative for dysphoric mood and sleep disturbance. The patient is not nervous/anxious.       Physical Exam  Constitutional: He is oriented to person, place, and time. He appears well-developed and well-nourished.  HENT:  Head: Normocephalic and atraumatic.  Neck: Normal range of motion. Neck supple. No JVD present.  Cardiovascular: Normal rate and regular rhythm.  Pulmonary/Chest: Effort normal. No respiratory distress. He has no wheezes. He has no  rales.  Abdominal: Soft. He exhibits no distension. There is no tenderness.  Musculoskeletal: He exhibits no edema or tenderness.  Neurological: He is alert and oriented to person, place, and time.  Skin: Skin is warm and dry.  Psychiatric: He has a normal mood and affect. His behavior is normal. Thought content normal.  Nursing note and vitals reviewed.  Assessment & Plan:  1: Chronic heart failure  with reduced ejection fraction- - NYHA class II - euvolemic today - weighing daily and says that his weight has declined. Discussed calling for an overnight weight gain of >2 pounds or a weekly weight gain of >5 pounds - weight down 7.2 pounds since he was last here - not adding salt and has been reading food labels. Discussed closely following a 2000mg  sodium diet  - ate fried fish yesterday - saw cardiologist Juliann Pares) 11/10/17  -  - patient reports receiving his flu vaccine for the season already - at last visit after receiving lab results, torsemide was decreased to 40mg  AM/ 20mg  PM along with increasing potassium to daily - will get a BMP today   2: HTN- - BP looks good today - says that his BP at home runs 90-100's/ 60-70's - could consider entresto if his BP allows - saw PCP [name]) 11/07/17 - BMP from 12/01/17 reviewed and showed sodium 136, potassium 3.5 and GFR 55  3: Diabetes- - glucose at home this morning was  - A1c on 10/10/17 was 6.8%  Medication bottles were reviewed.

## 2018-01-07 ENCOUNTER — Ambulatory Visit: Payer: Medicare Other | Admitting: Family

## 2018-01-14 ENCOUNTER — Other Ambulatory Visit: Payer: Self-pay | Admitting: Internal Medicine

## 2018-02-04 ENCOUNTER — Ambulatory Visit: Payer: Medicare Other

## 2018-02-11 ENCOUNTER — Ambulatory Visit (INDEPENDENT_AMBULATORY_CARE_PROVIDER_SITE_OTHER): Payer: Medicare Other | Admitting: Internal Medicine

## 2018-02-11 ENCOUNTER — Encounter: Payer: Self-pay | Admitting: Internal Medicine

## 2018-02-11 VITALS — BP 108/68 | HR 88 | Ht 69.0 in | Wt 192.0 lb

## 2018-02-11 DIAGNOSIS — J449 Chronic obstructive pulmonary disease, unspecified: Secondary | ICD-10-CM | POA: Diagnosis not present

## 2018-02-11 DIAGNOSIS — F17201 Nicotine dependence, unspecified, in remission: Secondary | ICD-10-CM

## 2018-02-11 DIAGNOSIS — E1143 Type 2 diabetes mellitus with diabetic autonomic (poly)neuropathy: Secondary | ICD-10-CM | POA: Diagnosis not present

## 2018-02-11 DIAGNOSIS — I10 Essential (primary) hypertension: Secondary | ICD-10-CM | POA: Diagnosis not present

## 2018-02-11 DIAGNOSIS — IMO0001 Reserved for inherently not codable concepts without codable children: Secondary | ICD-10-CM

## 2018-02-11 DIAGNOSIS — M539 Dorsopathy, unspecified: Secondary | ICD-10-CM

## 2018-02-11 DIAGNOSIS — I739 Peripheral vascular disease, unspecified: Secondary | ICD-10-CM

## 2018-02-11 DIAGNOSIS — Z720 Tobacco use: Secondary | ICD-10-CM | POA: Diagnosis not present

## 2018-02-11 DIAGNOSIS — E1165 Type 2 diabetes mellitus with hyperglycemia: Secondary | ICD-10-CM | POA: Diagnosis not present

## 2018-02-11 NOTE — Progress Notes (Signed)
Date:  02/11/2018   Name:  Gary Gutierrez   DOB:  07-05-1951   MRN:  [id number]   Chief Complaint: Hypertension and Diabetes Diabetes  He presents for his follow-up diabetic visit. He has type 2 diabetes mellitus. His disease course has been fluctuating. Pertinent negatives for hypoglycemia include no dizziness, headaches or tremors. Associated symptoms include foot paresthesias. Pertinent negatives for diabetes include no chest pain, no fatigue, no polydipsia and no polyuria. Symptoms are stable. Diabetic complications include PVD. Current diabetic treatments: kombiglyze, jardiance, glipizide. He is compliant with treatment most of the time. He monitors blood glucose at home 1-2 x per day. His breakfast blood glucose is taken between 6-7 am. His breakfast blood glucose range is generally 90-110 mg/dl. An ACE inhibitor/angiotensin II receptor blocker is being taken.  Hypertension  This is a chronic problem. The problem is unchanged. Associated symptoms include neck pain and shortness of breath. Pertinent negatives include no chest pain, headaches or palpitations. Past treatments include beta blockers, ACE inhibitors, diuretics and direct vasodilators. Hypertensive end-organ damage includes CAD/MI and PVD.  Neck Pain   This is a chronic problem. The problem occurs intermittently. The pain is associated with nothing. The pain is present in the right side. Pertinent negatives include no chest pain, headaches or numbness. He has tried NSAIDs for the symptoms. The treatment provided mild relief.  CAD - followed by cardiology. Last seen in December, no medication changes were made.  He was stable at that time. He denies any chest pains.  His SOB is stable.  CHF - chronic stable currently.  His ECHO last fall showed EF 20-25%.  PVD - has been seen by vascular surgery.  No intervention has been performed. He is on plavix and aspirin. He has some leg heaviness with walking but it is not worsening. He  can walk about one block before he has to stop due to SOB and fatigue.  COPD - no longer smoking.  CXR last fall showed changes of COPD.  He has been treated recently for shortness of breath in the UC.  He only uses albuterol MDI as needed.  He is not on maintenance medication. He denies significant symptoms.  He tells me that he no longer has any disability income.  He has never contributed to [facility] but was getting a small check.  However, they found out that he had a truck and a small life insurance policy in his name.  He is appealing and asks if we can provide records and information.  Review of Systems  Constitutional: Negative for appetite change, chills, fatigue and unexpected weight change.  Eyes: Negative for visual disturbance.  Respiratory: Positive for shortness of breath. Negative for cough and wheezing.   Cardiovascular: Negative for chest pain, palpitations and leg swelling.  Gastrointestinal: Negative for abdominal pain and blood in stool.  Endocrine: Negative for polydipsia and polyuria.  Genitourinary: Negative for dysuria and hematuria.  Musculoskeletal: Positive for arthralgias, back pain and neck pain.  Skin: Negative for color change and rash.  Neurological: Negative for dizziness, tremors, syncope, numbness and headaches.  Psychiatric/Behavioral: Negative for dysphoric mood and sleep disturbance.    Patient Active Problem List   Diagnosis Date Noted  . CHF (congestive heart failure) (HCC) 10/29/2017  . Diabetic autonomic neuropathy associated with type 2 diabetes mellitus (HCC) 07/01/2017  . DJD (degenerative joint disease), lumbosacral 07/01/2017  . PAD (peripheral artery disease) (HCC) 06/30/2017  . Tobacco use disorder, mild, in sustained remission, abuse  04/10/2017  . Uncontrolled type 2 diabetes mellitus without complication, without long-term current use of insulin (HCC) 08/12/2016  . Hx of colonic polyps 04/10/2016  . Hearing loss of both ears 01/03/2016  .  Acid reflux 06/09/2015  . Mixed hyperlipidemia 06/09/2015  . Essential hypertension 06/09/2015  . Multilevel degenerative disc disease 06/09/2015  . Coronary artery disease involving native coronary artery of native heart with angina pectoris (HCC) 05/09/2014  . Coronary artery disease involving autologous vein bypass graft 05/09/2014    Prior to Admission medications   Medication Sig Start Date End Date Taking? Authorizing Provider  ACCU-CHEK AVIVA PLUS test strip TEST BLOOD SUGAR EVERY DAY AS DIRECTED 01/15/18   Reubin MilanBerglund, Diantha Paxson H, MD  albuterol (PROVENTIL HFA;VENTOLIN HFA) 108 (90 Base) MCG/ACT inhaler Inhale 1-2 puffs into the lungs every 6 (six) hours as needed for wheezing or shortness of breath. 10/21/17   Tommie Samsook, Jayce G, DO  allopurinol (ZYLOPRIM) 300 MG tablet TAKE 1 TABLET BY MOUTH EVERY DAY 12/03/17   Reubin MilanBerglund, Kelley Knoth H, MD  aspirin EC 81 MG tablet Take 81 mg by mouth daily. Reported on 04/10/2016    [provider]  atorvastatin (LIPITOR) 80 MG tablet TAKE 1 TABLET BY MOUTH AT BEDTIME 11/23/17   Reubin MilanBerglund, Brooklyn Jeff H, MD  carvedilol (COREG) 12.5 MG tablet TAKE 1 TABLET(12.5 MG) BY MOUTH TWICE DAILY WITH A MEAL 11/27/17   Reubin MilanBerglund, Rhia Blatchford H, MD  clopidogrel (PLAVIX) 75 MG tablet Take 75 mg by mouth daily.    [provider]  glipiZIDE (GLUCOTROL XL) 10 MG 24 hr tablet TAKE 1 TABLET BY MOUTH EVERY DAY 12/03/17   Reubin MilanBerglund, Breckan Cafiero H, MD  isosorbide mononitrate (IMDUR) 30 MG 24 hr tablet Take 1 tablet (30 mg total) by mouth daily. 02/13/17 02/13/18  [name], [name], MD  JARDIANCE 25 MG TABS tablet TAKE 1 TABLET BY MOUTH DAILY 01/06/18   Reubin MilanBerglund, Rumor Sun H, MD  KOMBIGLYZE XR 2.04-999 MG TB24 TAKE 2 TABLETS BY MOUTH DAILY 10/20/17   Reubin MilanBerglund, Tomy Khim H, MD  lisinopril (PRINIVIL,ZESTRIL) 5 MG tablet Take 1 tablet (5 mg total) by mouth daily. Patient not taking: Reported on 11/24/2017 10/10/17   Reubin MilanBerglund, Rhodes Calvert H, MD  meloxicam (MOBIC) 15 MG tablet Take 1 tablet (15 mg total) by mouth daily.  10/31/17   Katha HammingKonidena, [name], MD  nitroGLYCERIN (NITROSTAT) 0.4 MG SL tablet Place 1 tablet (0.4 mg total) under the tongue every 5 (five) minutes as needed for chest pain. 12/01/17   Delma FreezeHackney, Tina A, FNP  pantoprazole (PROTONIX) 40 MG tablet Take 40 mg daily by mouth. 09/11/17   [provider]  potassium chloride SA (K-DUR,KLOR-CON) 20 MEQ tablet Take 2 tablets (40 mEq total) by mouth daily. 12/16/17   Delma FreezeHackney, Tina A, FNP  torsemide (DEMADEX) 20 MG tablet Take 2 tablets (40 mg total) by mouth 2 (two) times daily. 11/24/17 02/22/18  Delma FreezeHackney, Tina A, FNP    Allergies  Allergen Reactions  . Penicillins Other (See Comments)    Other reaction(s): Unknown, pt does not know reactions    Past Surgical History:  Procedure Laterality Date  . APPENDECTOMY    . colonscopy  2010   benign polyps  . CORONARY ARTERY BYPASS GRAFT  1995  . CORONARY STENT INTERVENTION N/A 02/03/2017   Procedure: Coronary Stent Intervention;  Surgeon: Alwyn Peawayne D Callwood, MD;  Location: [facility] INVASIVE CV [facility];  Service: Cardiovascular;  Laterality: N/A;  . ESOPHAGOGASTRODUODENOSCOPY  2010   normal  . LEFT HEART CATH AND CORONARY ANGIOGRAPHY N/A 02/03/2017  Procedure: Left Heart Cath and Coronary Angiography;  Surgeon: Alwyn Pea, MD;  Location: [facility] INVASIVE CV [facility];  Service: Cardiovascular;  Laterality: N/A;  . PERCUTANEOUS CORONARY STENT INTERVENTION (PCI-S)  2010, 2015   4 stents total    Social History   Tobacco Use  . Smoking status: Former Smoker    Types: Cigarettes    Last attempt to quit: 12/30/1994    Years since quitting: 23.1  . Smokeless tobacco: Never Used  Substance Use Topics  . Alcohol use: No    Alcohol/week: 0.0 oz  . Drug use: No     Medication list has been reviewed and updated.  PHQ 2/9 Scores 12/01/2017 11/24/2017 11/10/2017 06/10/2017  PHQ - 2 Score 0 0 0 0    Physical Exam  Constitutional: He is oriented to person, place, and time. He appears well-developed. No distress.   HENT:  Head: Normocephalic and atraumatic.  Neck: Normal range of motion. Neck supple. No thyromegaly present.  Cardiovascular: Normal rate, regular rhythm and normal heart sounds. Exam reveals no friction rub.  No murmur heard. Pulmonary/Chest: Effort normal. No respiratory distress.  Musculoskeletal:       Cervical back: He exhibits decreased range of motion and bony tenderness.  Neurological: He is alert and oriented to person, place, and time. He has normal strength. A sensory deficit is present.  Skin: Skin is warm and dry. No rash noted.  Psychiatric: He has a normal mood and affect. His speech is normal and behavior is normal. Thought content normal.  Nursing note and vitals reviewed.   BP 108/68   Pulse 88   Ht 5\' 9"  (1.753 m)   Wt 192 lb (87.1 kg)   SpO2 97%   BMI 28.35 kg/m   Assessment and Plan: 1. Essential hypertension controlled  2. Uncontrolled type 2 diabetes mellitus without complication, without long-term current use of insulin (HCC) Doing well on medication - Hemoglobin A1c - Basic metabolic panel  3. Diabetic autonomic neuropathy associated with type 2 diabetes mellitus (HCC) Mild, unchanged  4. Chronic obstructive pulmonary disease, unspecified COPD type (HCC) Continue albuterol as needed only  5. PAD (peripheral artery disease) (HCC) Continue plavix  6. Multilevel degenerative disc disease Continue mobic but may have to stop due to renal function decline  7. Tobacco use disorder, mild, in sustained remission, abuse Remains tobacco free   No orders of the defined types were placed in this encounter.   Partially dictated using Animal nutritionist. Any errors are unintentional.  Bari Edward, MD [facility]  02/11/2018

## 2018-02-12 LAB — BASIC METABOLIC PANEL
BUN / CREAT RATIO: 20 (ref 10–24)
BUN: 25 mg/dL (ref 8–27)
CHLORIDE: 95 mmol/L — AB (ref 96–106)
CO2: 25 mmol/L (ref 20–29)
Calcium: 9.7 mg/dL (ref 8.6–10.2)
Creatinine, Ser: 1.24 mg/dL (ref 0.76–1.27)
GFR, EST AFRICAN AMERICAN: 69 mL/min/{1.73_m2} (ref >59)
GFR, EST NON AFRICAN AMERICAN: 60 mL/min/{1.73_m2} (ref >59)
Glucose: 191 mg/dL — ABNORMAL HIGH (ref 65–99)
POTASSIUM: 4.6 mmol/L (ref 3.5–5.2)
SODIUM: 140 mmol/L (ref 134–144)

## 2018-02-12 LAB — HEMOGLOBIN A1C
ESTIMATED AVERAGE GLUCOSE: 169 mg/dL
HEMOGLOBIN A1C: 7.5 % — AB (ref 4.8–5.6)

## 2018-02-20 ENCOUNTER — Ambulatory Visit (INDEPENDENT_AMBULATORY_CARE_PROVIDER_SITE_OTHER): Payer: Medicare Other | Admitting: Internal Medicine

## 2018-02-20 ENCOUNTER — Encounter: Payer: Self-pay | Admitting: Internal Medicine

## 2018-02-20 VITALS — BP 128/82 | HR 91 | Temp 97.6°F | Ht 69.0 in | Wt 192.0 lb

## 2018-02-20 DIAGNOSIS — J440 Chronic obstructive pulmonary disease with acute lower respiratory infection: Secondary | ICD-10-CM

## 2018-02-20 DIAGNOSIS — I509 Heart failure, unspecified: Secondary | ICD-10-CM

## 2018-02-20 MED ORDER — PREDNISONE 10 MG PO TABS: ORAL_TABLET | ORAL | 0 refills | Status: DC

## 2018-02-20 MED ORDER — DOXYCYCLINE HYCLATE 100 MG PO TABS: 100.0000 mg | ORAL_TABLET | Freq: Two times a day (BID) | ORAL | 0 refills | Status: AC

## 2018-02-20 NOTE — Progress Notes (Signed)
Patient ID: Gary Gutierrez, male    DOB: 1951-03-14, 67 y.o.   MRN: [id number]  HPI  Gary Gutierrez is a 67 y/o male with a history of CAD, DM, hyperlipidemia, HTN, remote tobacco use and chronic heart failure.   Echo report from 10/29/17 reviewed and shows an EF of 20-25%. EF has declined from previous echo February 2018. Cardiac catheterization done on 02/03/17 showed multivessel disease. Successful PCI with DES from SVG to circumflex.    Admitted 10/29/17 due to HF. Initially needed bipap along with IV lasix. Cardiology consult was obtained. Medications were adjusted and he was discharged the next day. Was in the ED 10/21/17 due to COPD exacerbation where he was treated and released.   He presents today for a follow-up visit with a chief complaint of minimal shortness of breath upon moderate exertion. He describes this as chronic in nature having been present for several years. He has associated fatigue (improving) along with this. He denies any difficulty sleeping, weight gain, abdominal distention, palpitations, chest pain, edema, cough or dizziness. Wasn't sure if he needed to still be taking plavix so once the prescription ran out, he didn't call to get it refilled.   Past Medical History:  Diagnosis Date  . Allergy   . CHF (congestive heart failure) (HCC)   . Coronary artery disease   . Diabetes mellitus without complication (HCC)   . Hyperlipidemia   . Hypertension    Past Surgical History:  Procedure Laterality Date  . APPENDECTOMY    . colonscopy  2010   benign polyps  . CORONARY ARTERY BYPASS GRAFT  1995  . CORONARY STENT INTERVENTION N/A 02/03/2017   Procedure: Coronary Stent Intervention;  Surgeon: Alwyn Pea, MD;  Location: [facility];  Service: Cardiovascular;  Laterality: N/A;  . ESOPHAGOGASTRODUODENOSCOPY  2010   normal  . LEFT HEART CATH AND CORONARY ANGIOGRAPHY N/A 02/03/2017   Procedure: Left Heart Cath and Coronary Angiography;  Surgeon: Alwyn Pea, MD;  Location: [facility];  Service: Cardiovascular;  Laterality: N/A;  . PERCUTANEOUS CORONARY STENT INTERVENTION (PCI-S)  2010, 2015   4 stents total   Family History  Problem Relation Age of Onset  . Diabetes Father   . Diabetes Brother   . Prostate cancer Brother   . Lung cancer Brother    Social History   Tobacco Use  . Smoking status: Former Smoker    Types: Cigarettes    Last attempt to quit: 12/30/1994    Years since quitting: 23.1  . Smokeless tobacco: Never Used  Substance Use Topics  . Alcohol use: No    Alcohol/week: 0.0 oz   Allergies  Allergen Reactions  . Penicillins Other (See Comments)    Other reaction(s): Unknown, pt does not know reactions   Prior to Admission medications   Medication Sig Start Date End Date Taking? Authorizing Provider  ACCU-CHEK AVIVA PLUS test strip TEST BLOOD SUGAR EVERY DAY AS DIRECTED 01/15/18  Yes [name], MD  allopurinol (ZYLOPRIM) 300 MG tablet TAKE 1 TABLET BY MOUTH EVERY DAY 12/03/17  Yes [name], MD  aspirin EC 81 MG tablet Take 81 mg by mouth daily. Reported on 04/10/2016   Yes [provider]  atorvastatin (LIPITOR) 80 MG tablet TAKE 1 TABLET BY MOUTH AT BEDTIME 11/23/17  Yes [name], MD  carvedilol (COREG) 12.5 MG tablet TAKE 1 TABLET(12.5 MG) BY MOUTH TWICE DAILY WITH A MEAL 11/27/17  Yes Bari Edward  H, MD  doxycycline (VIBRA-TABS) 100 MG tablet Take 1 tablet (100 mg total) by mouth 2 (two) times daily for 10 days. 02/20/18 03/02/18 Yes Reubin MilanBerglund, [name], MD  glipiZIDE (GLUCOTROL XL) 10 MG 24 hr tablet TAKE 1 TABLET BY MOUTH EVERY DAY 12/03/17  Yes Reubin MilanBerglund, [name], MD  isosorbide mononitrate (IMDUR) 30 MG 24 hr tablet Take 1 tablet (30 mg total) by mouth daily. 02/13/17 02/23/18 Yes [name], [name], MD  JARDIANCE 25 MG TABS tablet TAKE 1 TABLET BY MOUTH DAILY 01/06/18  Yes Reubin MilanBerglund, [name], MD  KOMBIGLYZE XR 2.04-999 MG TB24 TAKE 2 TABLETS BY MOUTH DAILY 10/20/17  Yes  Reubin MilanBerglund, [name], MD  nitroGLYCERIN (NITROSTAT) 0.4 MG SL tablet Place 1 tablet (0.4 mg total) under the tongue every 5 (five) minutes as needed for chest pain. 12/01/17  Yes Casi Westerfeld, Inetta Fermoina A, FNP  pantoprazole (PROTONIX) 40 MG tablet Take 40 mg by mouth 2 (two) times daily.  09/11/17  Yes [provider]  potassium chloride SA (K-DUR,KLOR-CON) 20 MEQ tablet Take 2 tablets (40 mEq total) by mouth daily. 12/16/17  Yes Morelia Cassells, Inetta Fermoina A, FNP  predniSONE (DELTASONE) 10 MG tablet Take 6 on day 1, 5 on day 2, 4 on day 3, 3 on day 4, 2 on day 5 and 1 on day 1 then stop. 02/20/18  Yes Reubin MilanBerglund, [name], MD  ranitidine (ZANTAC) 150 MG capsule Take 150 mg by mouth 2 (two) times daily.   Yes [provider]  torsemide (DEMADEX) 20 MG tablet Take 2 tablets (40 mg total) by mouth 2 (two) times daily. 11/24/17 02/23/18 Yes Mareli Antunes, Jarold Songina A, FNP  albuterol (PROVENTIL HFA;VENTOLIN HFA) 108 (90 Base) MCG/ACT inhaler Inhale 1-2 puffs into the lungs every 6 (six) hours as needed for wheezing or shortness of breath. Patient not taking: Reported on 02/23/2018 10/21/17   Tommie Samsook, Jayce G, DO     Review of Systems  Constitutional: Positive for fatigue (improving). Negative for appetite change and fever.  HENT: Negative for congestion, postnasal drip and sore throat.   Eyes: Negative.   Respiratory: Positive for shortness of breath. Negative for cough and chest tightness.   Cardiovascular: Negative for chest pain, palpitations and leg swelling.  Gastrointestinal: Negative for abdominal distention, abdominal pain and constipation.  Endocrine: Negative.   Genitourinary: Negative.   Musculoskeletal: Negative for back pain and neck pain.  Skin: Negative.   Allergic/Immunologic: Negative.   Neurological: Negative for dizziness and light-headedness.  Hematological: Negative for adenopathy. Does not bruise/bleed easily.  Psychiatric/Behavioral: Negative for dysphoric mood and sleep disturbance. The patient is not  nervous/anxious.    Vitals:   02/23/18 0932  BP: 121/77  Pulse: 82  Resp: 18  SpO2: 98%  Weight: 192 lb (87.1 kg)   Wt Readings from Last 3 Encounters:  02/23/18 192 lb (87.1 kg)  02/20/18 192 lb (87.1 kg)  02/11/18 192 lb (87.1 kg)   Lab Results  Component Value Date   CREATININE 1.24 02/11/2018   CREATININE 1.31 (H) 12/01/2017   CREATININE 1.13 11/24/2017    Physical Exam  Constitutional: He is oriented to person, place, and time. He appears well-developed and well-nourished.  HENT:  Head: Normocephalic and atraumatic.  Neck: Normal range of motion. Neck supple. No JVD present.  Cardiovascular: Normal rate and regular rhythm.  Pulmonary/Chest: Effort normal. No respiratory distress. He has no wheezes. He has no rales.  Abdominal: Soft. He exhibits no distension. There is no tenderness.  Musculoskeletal: He exhibits no edema  or tenderness.  Neurological: He is alert and oriented to person, place, and time.  Skin: Skin is warm and dry.  Psychiatric: He has a normal mood and affect. His behavior is normal. Thought content normal.  Nursing note and vitals reviewed.  Assessment & Plan:  1: Chronic heart failure with reduced ejection fraction- - NYHA class II - euvolemic today - weighing daily and says that his weight has stabilized. Reminded to call for an overnight weight gain of >2 pounds or a weekly weight gain of >5 pounds - weight stable since he was last here - not adding salt and has been reading food labels. Discussed closely following a 2000mg  sodium diet  - saw cardiologist Juliann Pares) 12/10/17 & he mentioned to continue plavix so a new RX was sent to his pharmacy - will add entresto 24/26mg  BID. 30 day voucher given to patient - since starting entresto,will decrease his potassium down to daily (was daily) as most recent potassium level was 4.6 - may be able to also decrease diuretic in the future - will get a BMP at his next visit - patient reports  receiving his flu vaccine for the season already  2: HTN- - BP looks good today - says that his BP at home has been "good" - saw PCP [name]) 02/11/18 - BMP from 02/11/18 reviewed and showed sodium 140, potassium 4.6 and GFR 60  3: Diabetes- - glucose at home this morning was 176 after drinking coffee with creamer - A1c on 10/10/17 was 6.8%  Medication bottles were reviewed.  Return in 1 month or sooner for any questions/problems before then.

## 2018-02-20 NOTE — Progress Notes (Signed)
Date:  02/20/2018   Name:  Gary Gutierrez   DOB:  09/06/1951   MRN:  [id number]   Chief Complaint: Cough (Feels unwell- chest cold. Taking nyquil and getting worse. Yellow mucous with cough. )  Cough  This is a new problem. The current episode started in the past 7 days. The problem has been gradually worsening. The problem occurs every few minutes. The cough is productive of sputum. Associated symptoms include nasal congestion, shortness of breath and wheezing. Pertinent negatives include no chest pain, chills, fever, headaches, heartburn or sore throat. The symptoms are aggravated by exercise and cold air. He has tried OTC cough suppressant for the symptoms. The treatment provided no relief. His past medical history is significant for COPD.   CHF - weight is stable, taking lasix 3-4 per day.  No LE edema and no PND.  He is struggling to find low sodium foods.   Review of Systems  Constitutional: Positive for fatigue. Negative for chills and fever.  HENT: Negative for congestion, sore throat and trouble swallowing.   Respiratory: Positive for cough, shortness of breath and wheezing.   Cardiovascular: Negative for chest pain and leg swelling.  Gastrointestinal: Negative for constipation, diarrhea, heartburn and vomiting.  Neurological: Negative for dizziness and headaches.    Patient Active Problem List   Diagnosis Date Noted  . Chronic obstructive pulmonary disease (HCC) 02/11/2018  . CHF (congestive heart failure) (HCC) 10/29/2017  . Diabetic autonomic neuropathy associated with type 2 diabetes mellitus (HCC) 07/01/2017  . DJD (degenerative joint disease), lumbosacral 07/01/2017  . PAD (peripheral artery disease) (HCC) 06/30/2017  . Tobacco use disorder, mild, in sustained remission, abuse 04/10/2017  . Uncontrolled type 2 diabetes mellitus without complication, without long-term current use of insulin (HCC) 08/12/2016  . Hx of colonic polyps 04/10/2016  . Hearing loss of both  ears 01/03/2016  . Acid reflux 06/09/2015  . Mixed hyperlipidemia 06/09/2015  . Essential hypertension 06/09/2015  . Multilevel degenerative disc disease 06/09/2015  . Coronary artery disease involving native coronary artery of native heart with angina pectoris (HCC) 05/09/2014  . Coronary artery disease involving autologous vein bypass graft 05/09/2014    Prior to Admission medications   Medication Sig Start Date End Date Taking? Authorizing Provider  ACCU-CHEK AVIVA PLUS test strip TEST BLOOD SUGAR EVERY DAY AS DIRECTED 01/15/18  Yes [name], MD  albuterol (PROVENTIL HFA;VENTOLIN HFA) 108 (90 Base) MCG/ACT inhaler Inhale 1-2 puffs into the lungs every 6 (six) hours as needed for wheezing or shortness of breath. 10/21/17  Yes Cook, Jayce G, DO  allopurinol (ZYLOPRIM) 300 MG tablet TAKE 1 TABLET BY MOUTH EVERY DAY 12/03/17  Yes [name], MD  aspirin EC 81 MG tablet Take 81 mg by mouth daily. Reported on 04/10/2016   Yes [provider]  atorvastatin (LIPITOR) 80 MG tablet TAKE 1 TABLET BY MOUTH AT BEDTIME 11/23/17  Yes [name], MD  carvedilol (COREG) 12.5 MG tablet TAKE 1 TABLET(12.5 MG) BY MOUTH TWICE DAILY WITH A MEAL 11/27/17  Yes [name], MD  clopidogrel (PLAVIX) 75 MG tablet Take 75 mg by mouth daily.   Yes [provider]  glipiZIDE (GLUCOTROL XL) 10 MG 24 hr tablet TAKE 1 TABLET BY MOUTH EVERY DAY 12/03/17  Yes [name], MD  JARDIANCE 25 MG TABS tablet TAKE 1 TABLET BY MOUTH DAILY 01/06/18  Yes [name], MD  KOMBIGLYZE XR 2.04-999 MG TB24 TAKE 2 TABLETS BY MOUTH  DAILY 10/20/17  Yes Reubin MilanBerglund, Wyatt Galvan H, MD  meloxicam (MOBIC) 15 MG tablet Take 1 tablet (15 mg total) by mouth daily. 10/31/17  Yes Katha HammingKonidena, [name], MD  nitroGLYCERIN (NITROSTAT) 0.4 MG SL tablet Place 1 tablet (0.4 mg total) under the tongue every 5 (five) minutes as needed for chest pain. 12/01/17  Yes Hackney, Tina A, FNP  pantoprazole (PROTONIX) 40 MG  tablet Take 40 mg daily by mouth. 09/11/17  Yes [provider]  potassium chloride SA (K-DUR,KLOR-CON) 20 MEQ tablet Take 2 tablets (40 mEq total) by mouth daily. 12/16/17  Yes Clarisa KindredHackney, Tina A, FNP  torsemide (DEMADEX) 20 MG tablet Take 2 tablets (40 mg total) by mouth 2 (two) times daily. 11/24/17 02/22/18 Yes Clarisa KindredHackney, Tina A, FNP  isosorbide mononitrate (IMDUR) 30 MG 24 hr tablet Take 1 tablet (30 mg total) by mouth daily. 02/13/17 02/13/18  [name], [name], MD    Allergies  Allergen Reactions  . Penicillins Other (See Comments)    Other reaction(s): Unknown, pt does not know reactions    Past Surgical History:  Procedure Laterality Date  . APPENDECTOMY    . colonscopy  2010   benign polyps  . CORONARY ARTERY BYPASS GRAFT  1995  . CORONARY STENT INTERVENTION N/A 02/03/2017   Procedure: Coronary Stent Intervention;  Surgeon: Alwyn Peawayne D Callwood, MD;  Location: [facility];  Service: Cardiovascular;  Laterality: N/A;  . ESOPHAGOGASTRODUODENOSCOPY  2010   normal  . LEFT HEART CATH AND CORONARY ANGIOGRAPHY N/A 02/03/2017   Procedure: Left Heart Cath and Coronary Angiography;  Surgeon: Alwyn Peawayne D Callwood, MD;  Location: [facility];  Service: Cardiovascular;  Laterality: N/A;  . PERCUTANEOUS CORONARY STENT INTERVENTION (PCI-S)  2010, 2015   4 stents total    Social History   Tobacco Use  . Smoking status: Former Smoker    Types: Cigarettes    Last attempt to quit: 12/30/1994    Years since quitting: 23.1  . Smokeless tobacco: Never Used  Substance Use Topics  . Alcohol use: No    Alcohol/week: 0.0 oz  . Drug use: No     Medication list has been reviewed and updated.  PHQ 2/9 Scores 12/01/2017 11/24/2017 11/10/2017 06/10/2017  PHQ - 2 Score 0 0 0 0    Physical Exam  Constitutional: He is oriented to person, place, and time. He appears well-developed. No distress.  HENT:  Head: Normocephalic and atraumatic.  Cardiovascular: Normal rate, regular rhythm and  normal heart sounds.  Pulmonary/Chest: Effort normal. No respiratory distress. He has decreased breath sounds in the right upper field and the right lower field. He has wheezes. He has no rhonchi. He has no rales.  Musculoskeletal: Normal range of motion.  Neurological: He is alert and oriented to person, place, and time.  Skin: Skin is warm and dry. No rash noted.  Psychiatric: He has a normal mood and affect. His behavior is normal. Thought content normal.  Nursing note and vitals reviewed.   BP 128/82   Pulse 91   Temp 97.6 F (36.4 C) (Oral)   Ht 5\' 9"  (1.753 m)   Wt 192 lb (87.1 kg)   SpO2 98%   BMI 28.35 kg/m   Assessment and Plan: 1. Chronic obstructive pulmonary disease with acute lower respiratory infection (HCC) Breo sample given Continue otc cough syrup - predniSONE (DELTASONE) 10 MG tablet; Take 6 on day 1, 5 on day 2, 4 on day 3, 3 on day 4, 2 on day 5  and 1 on day 1 then stop.  Dispense: 21 tablet; Refill: 0 - doxycycline (VIBRA-TABS) 100 MG tablet; Take 1 tablet (100 mg total) by mouth 2 (two) times daily for 10 days.  Dispense: 20 tablet; Refill: 0  2. Acute congestive heart failure, unspecified heart failure type (HCC) Fluid status appears euvolemic today Continue current regiment and follow up with cardiology   Meds ordered this encounter  Medications  . predniSONE (DELTASONE) 10 MG tablet    Sig: Take 6 on day 1, 5 on day 2, 4 on day 3, 3 on day 4, 2 on day 5 and 1 on day 1 then stop.    Dispense:  21 tablet    Refill:  0  . doxycycline (VIBRA-TABS) 100 MG tablet    Sig: Take 1 tablet (100 mg total) by mouth 2 (two) times daily for 10 days.    Dispense:  20 tablet    Refill:  0    Partially dictated using Animal nutritionist. Any errors are unintentional.  Bari Edward, MD [facility]  02/20/2018

## 2018-02-23 ENCOUNTER — Encounter: Payer: Self-pay | Admitting: Family

## 2018-02-23 ENCOUNTER — Ambulatory Visit: Payer: Medicare Other | Attending: Family | Admitting: Family

## 2018-02-23 VITALS — BP 121/77 | HR 82 | Resp 18 | Wt 192.0 lb

## 2018-02-23 DIAGNOSIS — E119 Type 2 diabetes mellitus without complications: Secondary | ICD-10-CM | POA: Diagnosis not present

## 2018-02-23 DIAGNOSIS — Z951 Presence of aortocoronary bypass graft: Secondary | ICD-10-CM | POA: Diagnosis not present

## 2018-02-23 DIAGNOSIS — Z7902 Long term (current) use of antithrombotics/antiplatelets: Secondary | ICD-10-CM | POA: Insufficient documentation

## 2018-02-23 DIAGNOSIS — IMO0001 Reserved for inherently not codable concepts without codable children: Secondary | ICD-10-CM

## 2018-02-23 DIAGNOSIS — Z87891 Personal history of nicotine dependence: Secondary | ICD-10-CM | POA: Diagnosis not present

## 2018-02-23 DIAGNOSIS — E1165 Type 2 diabetes mellitus with hyperglycemia: Secondary | ICD-10-CM

## 2018-02-23 DIAGNOSIS — Z833 Family history of diabetes mellitus: Secondary | ICD-10-CM | POA: Insufficient documentation

## 2018-02-23 DIAGNOSIS — Z955 Presence of coronary angioplasty implant and graft: Secondary | ICD-10-CM | POA: Insufficient documentation

## 2018-02-23 DIAGNOSIS — Z7952 Long term (current) use of systemic steroids: Secondary | ICD-10-CM | POA: Diagnosis not present

## 2018-02-23 DIAGNOSIS — Z880 Allergy status to penicillin: Secondary | ICD-10-CM | POA: Insufficient documentation

## 2018-02-23 DIAGNOSIS — I110 Hypertensive heart disease with heart failure: Secondary | ICD-10-CM | POA: Diagnosis not present

## 2018-02-23 DIAGNOSIS — Z801 Family history of malignant neoplasm of trachea, bronchus and lung: Secondary | ICD-10-CM | POA: Diagnosis not present

## 2018-02-23 DIAGNOSIS — E785 Hyperlipidemia, unspecified: Secondary | ICD-10-CM | POA: Insufficient documentation

## 2018-02-23 DIAGNOSIS — Z9889 Other specified postprocedural states: Secondary | ICD-10-CM | POA: Insufficient documentation

## 2018-02-23 DIAGNOSIS — Z79899 Other long term (current) drug therapy: Secondary | ICD-10-CM | POA: Diagnosis not present

## 2018-02-23 DIAGNOSIS — I5022 Chronic systolic (congestive) heart failure: Secondary | ICD-10-CM | POA: Diagnosis not present

## 2018-02-23 DIAGNOSIS — Z8042 Family history of malignant neoplasm of prostate: Secondary | ICD-10-CM | POA: Insufficient documentation

## 2018-02-23 DIAGNOSIS — I10 Essential (primary) hypertension: Secondary | ICD-10-CM

## 2018-02-23 DIAGNOSIS — Z7982 Long term (current) use of aspirin: Secondary | ICD-10-CM | POA: Insufficient documentation

## 2018-02-23 DIAGNOSIS — I2510 Atherosclerotic heart disease of native coronary artery without angina pectoris: Secondary | ICD-10-CM | POA: Insufficient documentation

## 2018-02-23 MED ORDER — CLOPIDOGREL BISULFATE 75 MG PO TABS: 75.0000 mg | ORAL_TABLET | Freq: Every day | ORAL | 5 refills | Status: DC

## 2018-02-23 MED ORDER — SACUBITRIL-VALSARTAN 24-26 MG PO TABS: 1.0000 | ORAL_TABLET | Freq: Two times a day (BID) | ORAL | 5 refills | Status: DC

## 2018-02-23 NOTE — Patient Instructions (Addendum)
Continue weighing daily and call for an overnight weight gain of > 2 pounds or a weekly weight gain of >5 pounds.  Decrease potassium to one daily.  Begin entresto 24/26mg  twice daily.

## 2018-03-06 ENCOUNTER — Other Ambulatory Visit: Payer: Self-pay | Admitting: Internal Medicine

## 2018-03-18 ENCOUNTER — Other Ambulatory Visit: Payer: Self-pay | Admitting: Family

## 2018-03-19 NOTE — Progress Notes (Deleted)
Patient ID: Gary Gutierrez, male    DOB: [birth year], 67 y.o.   MRN: [id number]  HPI  Gary Gutierrez is a 67 y/o male with a history of CAD, DM, hyperlipidemia, HTN, remote tobacco use and chronic heart failure.   Echo report from 10/29/17 reviewed and shows an EF of 20-25%. EF has declined from previous echo February 2018. Cardiac catheterization done on 02/03/17 showed multivessel disease. Successful PCI with DES from SVG to circumflex.    Admitted 10/29/17 due to HF. Initially needed bipap along with IV lasix. Cardiology consult was obtained. Medications were adjusted and he was discharged the next day. Was in the ED 10/21/17 due to COPD exacerbation where he was treated and released.   He presents today for a follow-up visit with a chief complaint of   Past Medical History:  Diagnosis Date  . Allergy   . CHF (congestive heart failure) (HCC)   . Coronary artery disease   . Diabetes mellitus without complication (HCC)   . Hyperlipidemia   . Hypertension    Past Surgical History:  Procedure Laterality Date  . APPENDECTOMY    . colonscopy  2010   benign polyps  . CORONARY ARTERY BYPASS GRAFT  1995  . CORONARY STENT INTERVENTION N/A 02/03/2017   Procedure: Coronary Stent Intervention;  Surgeon: Alwyn Pea, MD;  Location: [facility];  Service: Cardiovascular;  Laterality: N/A;  . ESOPHAGOGASTRODUODENOSCOPY  2010   normal  . LEFT HEART CATH AND CORONARY ANGIOGRAPHY N/A 02/03/2017   Procedure: Left Heart Cath and Coronary Angiography;  Surgeon: Alwyn Pea, MD;  Location: [facility];  Service: Cardiovascular;  Laterality: N/A;  . PERCUTANEOUS CORONARY STENT INTERVENTION (PCI-S)  2010, 2015   4 stents total   Family History  Problem Relation Age of Onset  . Diabetes Father   . Diabetes Brother   . Prostate cancer Brother   . Lung cancer Brother    Social History   Tobacco Use  . Smoking status: Former Smoker    Types: Cigarettes    Last attempt  to quit: 12/30/1994    Years since quitting: 23.2  . Smokeless tobacco: Never Used  Substance Use Topics  . Alcohol use: No    Alcohol/week: 0.0 oz   Allergies  Allergen Reactions  . Penicillins Other (See Comments)    Other reaction(s): Unknown, pt does not know reactions      Review of Systems  Constitutional: Positive for fatigue (improving). Negative for appetite change and fever.  HENT: Negative for congestion, postnasal drip and sore throat.   Eyes: Negative.   Respiratory: Positive for shortness of breath. Negative for cough and chest tightness.   Cardiovascular: Negative for chest pain, palpitations and leg swelling.  Gastrointestinal: Negative for abdominal distention, abdominal pain and constipation.  Endocrine: Negative.   Genitourinary: Negative.   Musculoskeletal: Negative for back pain and neck pain.  Skin: Negative.   Allergic/Immunologic: Negative.   Neurological: Negative for dizziness and light-headedness.  Hematological: Negative for adenopathy. Does not bruise/bleed easily.  Psychiatric/Behavioral: Negative for dysphoric mood and sleep disturbance. The patient is not nervous/anxious.      Physical Exam  Constitutional: He is oriented to person, place, and time. He appears well-developed and well-nourished.  HENT:  Head: Normocephalic and atraumatic.  Neck: Normal range of motion. Neck supple. No JVD present.  Cardiovascular: Normal rate and regular rhythm.  Pulmonary/Chest: Effort normal. No respiratory distress. He has no wheezes. He has no rales.  Abdominal: Soft. He exhibits no distension. There is no tenderness.  Musculoskeletal: He exhibits no edema or tenderness.  Neurological: He is alert and oriented to person, place, and time.  Skin: Skin is warm and dry.  Psychiatric: He has a normal mood and affect. His behavior is normal. Thought content normal.  Nursing note and vitals reviewed.  Assessment & Plan:  1: Chronic heart failure with reduced  ejection fraction- - NYHA class II - euvolemic today - weighing daily and says that his weight has stabilized. Reminded to call for an overnight weight gain of >2 pounds or a weekly weight gain of >5 pounds - weight stable since he was last here - not adding salt and has been reading food labels. Discussed closely following a 2000mg  sodium diet  - saw cardiologist Juliann Pares([place]) 12/10/17  - will add entresto 24/26mg  BID. 30 day voucher given to patient - since starting entresto,will decrease his potassium down to 20meq daily (was 40meq daily) as most recent potassium level was 4.6 - may be able to also decrease diuretic in the future - will get a BMP at his next visit - patient reports receiving his flu vaccine for the season already  2: HTN- - BP looks good today - says that his BP at home has been "good" - saw PCP [name]([place]) 02/20/18 - BMP from 02/11/18 reviewed and showed sodium 140, potassium 4.6 and GFR 60  3: Diabetes- - glucose at home this morning was  - A1c on 10/10/17 was 6.8%  Medication bottles were reviewed.

## 2018-03-23 ENCOUNTER — Ambulatory Visit: Payer: Medicare Other | Admitting: Family

## 2018-04-03 NOTE — Progress Notes (Signed)
Patient ID: Roney MarionJames W Houp, male    DOB: 03-Jan-1951, 67 y.o.   MRN: [id number]  HPI  Mr Frederich Charaughber is a 67 y/o male with a history of CAD, DM, hyperlipidemia, HTN, remote tobacco use and chronic heart failure.   Echo report from 10/29/17 reviewed and shows an EF of 20-25%. EF has declined from previous echo February 2018. Cardiac catheterization done on 02/03/17 showed multivessel disease. Successful PCI with DES from SVG to circumflex.    Admitted 10/29/17 due to HF. Initially needed bipap along with IV lasix. Cardiology consult was obtained. Medications were adjusted and he was discharged the next day. Was in the ED 10/21/17 due to COPD exacerbation where he was treated and released.   He presents today for a follow-up visit with a chief complaint of minimal shortness of breath upon moderate exertion. He says this has been present for several years with varying levels of severity. He has associated cough, fatigue, back pain and neck pain along with this. Has some soreness in both upper arms due a recent fall down his outside steps when it was sleeting. He denies any difficulty sleeping, abdominal distention, palpitations, edema, chest pain, dizziness or weight gain. He says that when he was taking the entresto, his blood pressure got low and he felt dizzy and "bad". He stopped it and his blood pressure and dizziness improved. He tried taking it again and symptoms returned so he has since stopped it completely. No longer has any dizziness. Still feels like his BP is a little low for him.   Past Medical History:  Diagnosis Date  . Allergy   . CHF (congestive heart failure) (HCC)   . Coronary artery disease   . Diabetes mellitus without complication (HCC)   . Hyperlipidemia   . Hypertension    Past Surgical History:  Procedure Laterality Date  . APPENDECTOMY    . colonscopy  2010   benign polyps  . CORONARY ARTERY BYPASS GRAFT  1995  . CORONARY STENT INTERVENTION N/A 02/03/2017   Procedure:  Coronary Stent Intervention;  Surgeon: Alwyn Peawayne D Callwood, MD;  Location: [facility];  Service: Cardiovascular;  Laterality: N/A;  . ESOPHAGOGASTRODUODENOSCOPY  2010   normal  . LEFT HEART CATH AND CORONARY ANGIOGRAPHY N/A 02/03/2017   Procedure: Left Heart Cath and Coronary Angiography;  Surgeon: Alwyn Peawayne D Callwood, MD;  Location: [facility];  Service: Cardiovascular;  Laterality: N/A;  . PERCUTANEOUS CORONARY STENT INTERVENTION (PCI-S)  2010, 2015   4 stents total   Family History  Problem Relation Age of Onset  . Diabetes Father   . Diabetes Brother   . Prostate cancer Brother   . Lung cancer Brother    Social History   Tobacco Use  . Smoking status: Former Smoker    Types: Cigarettes    Last attempt to quit: 12/30/1994    Years since quitting: 23.2  . Smokeless tobacco: Never Used  Substance Use Topics  . Alcohol use: No    Alcohol/week: 0.0 oz   Allergies  Allergen Reactions  . Penicillins Other (See Comments)    Other reaction(s): Unknown, pt does not know reactions   Prior to Admission medications   Medication Sig Start Date End Date Taking? Authorizing Provider  ACCU-CHEK AVIVA PLUS test strip TEST BLOOD SUGAR EVERY DAY AS DIRECTED 01/15/18  Yes Reubin MilanBerglund, [name], MD  allopurinol (ZYLOPRIM) 300 MG tablet TAKE 1 TABLET BY MOUTH EVERY DAY 12/03/17  Yes Reubin MilanBerglund, [name], MD  aspirin EC 81 MG tablet Take 81 mg by mouth daily. Reported on 04/10/2016   Yes [provider]  atorvastatin (LIPITOR) 80 MG tablet TAKE 1 TABLET BY MOUTH AT BEDTIME 11/23/17  Yes [name], MD  carvedilol (COREG) 12.5 MG tablet TAKE 1 TABLET(12.5 MG) BY MOUTH TWICE DAILY WITH A MEAL 11/27/17  Yes [name], MD  clopidogrel (PLAVIX) 75 MG tablet Take 1 tablet (75 mg total) by mouth daily. 02/23/18  Yes Clarisa Kindred A, FNP  glipiZIDE (GLUCOTROL XL) 10 MG 24 hr tablet TAKE 1 TABLET BY MOUTH EVERY DAY 12/03/17  Yes [name], MD  isosorbide mononitrate  (IMDUR) 30 MG 24 hr tablet Take 1 tablet (30 mg total) by mouth daily. 02/13/17 04/06/18 Yes Jennye Moccasin, MD  KOMBIGLYZE XR 2.04-999 MG TB24 TAKE 2 TABLETS BY MOUTH DAILY 10/20/17  Yes [name], MD  nitroGLYCERIN (NITROSTAT) 0.4 MG SL tablet Place 1 tablet (0.4 mg total) under the tongue every 5 (five) minutes as needed for chest pain. Patient taking differently: Place 0.4 mg under the tongue every 5 (five) minutes as needed for chest pain.  12/01/17  Yes Hackney, Inetta Fermo A, FNP  pantoprazole (PROTONIX) 40 MG tablet Take 40 mg by mouth 2 (two) times daily.  09/11/17  Yes [provider]  potassium chloride SA (K-DUR,KLOR-CON) 20 MEQ tablet Take 40 mEq by mouth daily.    Yes [provider]  torsemide (DEMADEX) 20 MG tablet Take 2 tablets (40 mg total) by mouth 2 (two) times daily. 03/18/18  Yes Hackney, Inetta Fermo A, FNP  albuterol (PROVENTIL HFA;VENTOLIN HFA) 108 (90 Base) MCG/ACT inhaler Inhale 1-2 puffs into the lungs every 6 (six) hours as needed for wheezing or shortness of breath. Patient not taking: Reported on 04/06/2018 10/21/17   [name], DO  JARDIANCE 25 MG TABS tablet TAKE 1 TABLET BY MOUTH DAILY Patient not taking: Reported on 04/06/2018 03/06/18   [name], MD    Review of Systems  Constitutional: Positive for fatigue (improving). Negative for appetite change and fever.  HENT: Negative for congestion, postnasal drip and sore throat.   Eyes: Negative.   Respiratory: Positive for cough (productive cough) and shortness of breath. Negative for chest tightness.   Cardiovascular: Negative for chest pain, palpitations and leg swelling.  Gastrointestinal: Negative for abdominal distention, abdominal pain and constipation.  Endocrine: Negative.   Genitourinary: Negative.   Musculoskeletal: Positive for back pain, myalgias (both upper arms "sore" due to fall) and neck pain.  Skin: Negative.   Allergic/Immunologic: Negative.   Neurological: Negative for dizziness  and light-headedness.  Hematological: Negative for adenopathy. Does not bruise/bleed easily.  Psychiatric/Behavioral: Negative for dysphoric mood and sleep disturbance. The patient is not nervous/anxious.    Vitals:   04/06/18 1139  BP: 107/79  Pulse: 98  Resp: 18  SpO2: 97%  Weight: 190 lb 8 oz (86.4 kg)  Height: 5\' 9"  (1.753 m)   Wt Readings from Last 3 Encounters:  04/06/18 190 lb 8 oz (86.4 kg)  02/23/18 192 lb (87.1 kg)  02/20/18 192 lb (87.1 kg)   Lab Results  Component Value Date   CREATININE 1.24 02/11/2018   CREATININE 1.31 (H) 12/01/2017   CREATININE 1.13 11/24/2017    Physical Exam  Constitutional: He is oriented to person, place, and time. He appears well-developed and well-nourished.  HENT:  Head: Normocephalic and atraumatic.  Neck: Normal range of motion. Neck supple. No JVD present.  Cardiovascular: Normal rate  and regular rhythm.  Pulmonary/Chest: Effort normal. No respiratory distress. He has no wheezes. He has no rales.  Abdominal: Soft. He exhibits no distension. There is no tenderness.  Musculoskeletal: He exhibits no edema or tenderness.  Neurological: He is alert and oriented to person, place, and time.  Skin: Skin is warm and dry.  Psychiatric: He has a normal mood and affect. His behavior is normal. Thought content normal.  Nursing note and vitals reviewed.  Assessment & Plan:  1: Chronic heart failure with reduced ejection fraction- - NYHA class II - euvolemic today - weighing daily. Reminded to call for an overnight weight gain of >2 pounds or a weekly weight gain of >5 pounds - weight down 2 pounds since 02/23/18 - not adding salt and has been reading food labels. Discussed closely following a 2000mg  sodium diet  - saw cardiologist Juliann Pares) 12/18/17  - patient was unable to tolerate entresto as it made his BP too low & made him feel "bad" so he stopped it and started feeling better - discussed possibly adding another medication (ACE-I/ARB)  but will not do so today since he had side effects from entresto and a recent fall that he's trying to recover from  2: HTN- - BP looks good today although on the low side - says that his BP at home had been low due to entresto - saw PCP [name]) 02/20/18 & returns June 2019 - BMP from 02/11/18 reviewed and showed sodium 140, potassium 4.6 and GFR 60  3: Diabetes- - glucose at home yesterday was "ok" - has been out of jardiance for about a week so instructed him to call PCP to get a new prescription - A1c on 10/10/17 was 6.8%  Medication bottles were reviewed.  Return in 3 months or sooner for any questions/problems before then.

## 2018-04-06 ENCOUNTER — Encounter: Payer: Self-pay | Admitting: Family

## 2018-04-06 ENCOUNTER — Ambulatory Visit: Payer: Medicare Other | Attending: Family | Admitting: Family

## 2018-04-06 ENCOUNTER — Telehealth: Payer: Self-pay

## 2018-04-06 ENCOUNTER — Other Ambulatory Visit: Payer: Self-pay

## 2018-04-06 VITALS — BP 107/79 | HR 98 | Resp 18 | Ht 69.0 in | Wt 190.5 lb

## 2018-04-06 DIAGNOSIS — Z8042 Family history of malignant neoplasm of prostate: Secondary | ICD-10-CM | POA: Insufficient documentation

## 2018-04-06 DIAGNOSIS — Z7982 Long term (current) use of aspirin: Secondary | ICD-10-CM | POA: Diagnosis not present

## 2018-04-06 DIAGNOSIS — Z801 Family history of malignant neoplasm of trachea, bronchus and lung: Secondary | ICD-10-CM | POA: Insufficient documentation

## 2018-04-06 DIAGNOSIS — E785 Hyperlipidemia, unspecified: Secondary | ICD-10-CM | POA: Insufficient documentation

## 2018-04-06 DIAGNOSIS — Z955 Presence of coronary angioplasty implant and graft: Secondary | ICD-10-CM | POA: Insufficient documentation

## 2018-04-06 DIAGNOSIS — I2510 Atherosclerotic heart disease of native coronary artery without angina pectoris: Secondary | ICD-10-CM | POA: Insufficient documentation

## 2018-04-06 DIAGNOSIS — E1143 Type 2 diabetes mellitus with diabetic autonomic (poly)neuropathy: Secondary | ICD-10-CM

## 2018-04-06 DIAGNOSIS — Z951 Presence of aortocoronary bypass graft: Secondary | ICD-10-CM | POA: Insufficient documentation

## 2018-04-06 DIAGNOSIS — I5022 Chronic systolic (congestive) heart failure: Secondary | ICD-10-CM

## 2018-04-06 DIAGNOSIS — Z7984 Long term (current) use of oral hypoglycemic drugs: Secondary | ICD-10-CM | POA: Diagnosis not present

## 2018-04-06 DIAGNOSIS — Z87891 Personal history of nicotine dependence: Secondary | ICD-10-CM | POA: Insufficient documentation

## 2018-04-06 DIAGNOSIS — I110 Hypertensive heart disease with heart failure: Secondary | ICD-10-CM | POA: Insufficient documentation

## 2018-04-06 DIAGNOSIS — Z880 Allergy status to penicillin: Secondary | ICD-10-CM | POA: Diagnosis not present

## 2018-04-06 DIAGNOSIS — Z7902 Long term (current) use of antithrombotics/antiplatelets: Secondary | ICD-10-CM | POA: Diagnosis not present

## 2018-04-06 DIAGNOSIS — I10 Essential (primary) hypertension: Secondary | ICD-10-CM

## 2018-04-06 DIAGNOSIS — Z79899 Other long term (current) drug therapy: Secondary | ICD-10-CM | POA: Diagnosis not present

## 2018-04-06 DIAGNOSIS — Z833 Family history of diabetes mellitus: Secondary | ICD-10-CM | POA: Diagnosis not present

## 2018-04-06 DIAGNOSIS — E119 Type 2 diabetes mellitus without complications: Secondary | ICD-10-CM | POA: Diagnosis not present

## 2018-04-06 MED ORDER — EMPAGLIFLOZIN 25 MG PO TABS: 25.0000 mg | ORAL_TABLET | Freq: Every day | ORAL | 2 refills | Status: DC

## 2018-04-06 NOTE — Telephone Encounter (Signed)
06/11/18 AWV rescheduled to 06/15/18 d/t program no longer available on Thurs.  While on phone with pt, pt states he is need of refill on Jardiance. States his pharmacy advised to call office for refill. Pharmacy listed in chart has been confirmed with pt and is requesting refill be sent there.

## 2018-04-06 NOTE — Telephone Encounter (Signed)
Refill patients medication and called to inform him. Thank you.

## 2018-04-06 NOTE — Patient Instructions (Signed)
Continue weighing daily and call for an overnight weight gain of > 2 pounds or a weekly weight gain of >5 pounds. 

## 2018-04-17 ENCOUNTER — Other Ambulatory Visit: Payer: Self-pay | Admitting: Internal Medicine

## 2018-04-17 DIAGNOSIS — IMO0001 Reserved for inherently not codable concepts without codable children: Secondary | ICD-10-CM

## 2018-04-17 DIAGNOSIS — E1165 Type 2 diabetes mellitus with hyperglycemia: Principal | ICD-10-CM

## 2018-05-19 ENCOUNTER — Other Ambulatory Visit: Payer: Self-pay | Admitting: Internal Medicine

## 2018-06-11 ENCOUNTER — Ambulatory Visit: Payer: Medicare Other

## 2018-06-12 ENCOUNTER — Ambulatory Visit (INDEPENDENT_AMBULATORY_CARE_PROVIDER_SITE_OTHER): Payer: Medicare Other | Admitting: Internal Medicine

## 2018-06-12 ENCOUNTER — Other Ambulatory Visit: Payer: Self-pay | Admitting: Internal Medicine

## 2018-06-12 ENCOUNTER — Encounter: Payer: Self-pay | Admitting: Internal Medicine

## 2018-06-12 VITALS — BP 138/88 | HR 79 | Temp 98.0°F | Resp 16 | Ht 69.0 in | Wt 188.0 lb

## 2018-06-12 DIAGNOSIS — I10 Essential (primary) hypertension: Secondary | ICD-10-CM | POA: Diagnosis not present

## 2018-06-12 DIAGNOSIS — E782 Mixed hyperlipidemia: Secondary | ICD-10-CM | POA: Diagnosis not present

## 2018-06-12 DIAGNOSIS — I5022 Chronic systolic (congestive) heart failure: Secondary | ICD-10-CM

## 2018-06-12 DIAGNOSIS — IMO0001 Reserved for inherently not codable concepts without codable children: Secondary | ICD-10-CM

## 2018-06-12 DIAGNOSIS — Z1211 Encounter for screening for malignant neoplasm of colon: Secondary | ICD-10-CM | POA: Diagnosis not present

## 2018-06-12 DIAGNOSIS — E1165 Type 2 diabetes mellitus with hyperglycemia: Secondary | ICD-10-CM

## 2018-06-12 DIAGNOSIS — Z720 Tobacco use: Secondary | ICD-10-CM | POA: Diagnosis not present

## 2018-06-12 DIAGNOSIS — F17201 Nicotine dependence, unspecified, in remission: Secondary | ICD-10-CM

## 2018-06-12 MED ORDER — GLIPIZIDE ER 10 MG PO TB24: 10.0000 mg | ORAL_TABLET | Freq: Every day | ORAL | 1 refills | Status: DC

## 2018-06-12 MED ORDER — ATORVASTATIN CALCIUM 80 MG PO TABS: 80.0000 mg | ORAL_TABLET | Freq: Every day | ORAL | 1 refills | Status: DC

## 2018-06-12 MED ORDER — CARVEDILOL 12.5 MG PO TABS: 12.5000 mg | ORAL_TABLET | Freq: Two times a day (BID) | ORAL | 1 refills | Status: DC

## 2018-06-12 MED ORDER — EMPAGLIFLOZIN 25 MG PO TABS: 25.0000 mg | ORAL_TABLET | Freq: Every day | ORAL | 2 refills | Status: DC

## 2018-06-12 NOTE — Patient Instructions (Signed)
Test Blood sugar twice a day - in the morning before eating and 2 hours after evening meal.

## 2018-06-12 NOTE — Progress Notes (Signed)
Date:  06/12/2018   Name:  Gary Gutierrez   DOB:  1951/05/25   MRN:  [id number]   Chief Complaint: Diabetes Diabetes  He presents for his follow-up diabetic visit. He has type 2 diabetes mellitus. Pertinent negatives for hypoglycemia include no dizziness, headaches or tremors. Pertinent negatives for diabetes include no chest pain, no fatigue, no polydipsia and no polyuria. There are no hypoglycemic complications. Risk factors for coronary artery disease include dyslipidemia and diabetes mellitus. Current diabetic treatments: 4 oral agents. His weight is stable. He is following a generally healthy diet. An ACE inhibitor/angiotensin II receptor blocker is not being taken.  Hypertension  This is a chronic problem. The problem is unchanged. The problem is controlled. Associated symptoms include shortness of breath. Pertinent negatives include no chest pain, headaches or palpitations. Past treatments include calcium channel blockers, diuretics and direct vasodilators. The current treatment provides significant improvement.  Congestive Heart Failure  Presents for follow-up visit. Associated symptoms include shortness of breath. Pertinent negatives include no abdominal pain, chest pain, fatigue, orthopnea, palpitations, paroxysmal nocturnal dyspnea or unexpected weight change. The symptoms have been stable. Compliance with total regimen is 76-100%. Compliance with medications is 76-100%.   Lab Results  Component Value Date   HGBA1C 7.5 (H) 02/11/2018   He has been out of medications off and on for a few weeks - jardiance was left out of refills due to pharmacy error.  He does know his medications well but thinks that he took some old Janumet during that time when glucoses were elevated.  He does not check fasting - only randomly.   Review of Systems  Constitutional: Negative for appetite change, fatigue and unexpected weight change.  HENT: Negative for sinus pressure and trouble swallowing.     Eyes: Negative for visual disturbance.  Respiratory: Positive for shortness of breath. Negative for cough, chest tightness and wheezing.   Cardiovascular: Negative for chest pain, palpitations and leg swelling.  Gastrointestinal: Negative for abdominal pain and blood in stool.  Endocrine: Negative for polydipsia and polyuria.  Genitourinary: Negative for dysuria and hematuria.  Musculoskeletal: Negative for arthralgias.  Skin: Negative for color change and rash.  Allergic/Immunologic: Negative for environmental allergies.  Neurological: Negative for dizziness, tremors, numbness and headaches.  Psychiatric/Behavioral: Negative for dysphoric mood and sleep disturbance.    Patient Active Problem List   Diagnosis Date Noted  . Chronic obstructive pulmonary disease (HCC) 02/11/2018  . CHF (congestive heart failure) (HCC) 10/29/2017  . Diabetic autonomic neuropathy associated with type 2 diabetes mellitus (HCC) 07/01/2017  . DJD (degenerative joint disease), lumbosacral 07/01/2017  . PAD (peripheral artery disease) (HCC) 06/30/2017  . Tobacco use disorder, mild, in sustained remission, abuse 04/10/2017  . Uncontrolled type 2 diabetes mellitus without complication, without long-term current use of insulin (HCC) 08/12/2016  . Hx of colonic polyps 04/10/2016  . Hearing loss of both ears 01/03/2016  . Mixed hyperlipidemia 06/09/2015  . Essential hypertension 06/09/2015  . Multilevel degenerative disc disease 06/09/2015  . Coronary artery disease involving native coronary artery of native heart with angina pectoris (HCC) 05/09/2014  . Coronary artery disease involving autologous vein bypass graft 05/09/2014    Prior to Admission medications   Medication Sig Start Date End Date Taking? Authorizing Provider  ACCU-CHEK AVIVA PLUS test strip TEST BLOOD SUGAR EVERY DAY AS DIRECTED 01/15/18   [name], MD  albuterol (PROVENTIL HFA;VENTOLIN HFA) 108 (90 Base) MCG/ACT inhaler Inhale 1-2  puffs into the lungs every 6 (six)  hours as needed for wheezing or shortness of breath. Patient not taking: Reported on 04/06/2018 10/21/17   [name], DO  allopurinol (ZYLOPRIM) 300 MG tablet TAKE 1 TABLET BY MOUTH EVERY DAY 12/03/17   [name], MD  aspirin EC 81 MG tablet Take 81 mg by mouth daily. Reported on 04/10/2016    [provider]  atorvastatin (LIPITOR) 80 MG tablet TAKE 1 TABLET BY MOUTH AT BEDTIME 05/20/18   [name], MD  carvedilol (COREG) 12.5 MG tablet TAKE 1 TABLET(12.5 MG) BY MOUTH TWICE DAILY WITH A MEAL 05/20/18   [name], MD  clopidogrel (PLAVIX) 75 MG tablet Take 1 tablet (75 mg total) by mouth daily. 02/23/18   Delma Freeze, FNP  empagliflozin (JARDIANCE) 25 MG TABS tablet Take 25 mg by mouth daily. 04/06/18   [name], MD  glipiZIDE (GLUCOTROL XL) 10 MG 24 hr tablet TAKE 1 TABLET BY MOUTH EVERY DAY 12/03/17   [name], MD  isosorbide mononitrate (IMDUR) 30 MG 24 hr tablet Take 1 tablet (30 mg total) by mouth daily. 02/13/17 04/06/18  Jennye Moccasin, MD  KOMBIGLYZE XR 2.04-999 MG TB24 TAKE 2 TABLETS BY MOUTH DAILY 04/17/18   [name], MD  nitroGLYCERIN (NITROSTAT) 0.4 MG SL tablet Place 1 tablet (0.4 mg total) under the tongue every 5 (five) minutes as needed for chest pain. Patient taking differently: Place 0.4 mg under the tongue every 5 (five) minutes as needed for chest pain.  12/01/17   Delma Freeze, FNP  pantoprazole (PROTONIX) 40 MG tablet Take 40 mg by mouth 2 (two) times daily.  09/11/17   [provider]  potassium chloride SA (K-DUR,KLOR-CON) 20 MEQ tablet Take 40 mEq by mouth daily.     [provider]  torsemide (DEMADEX) 20 MG tablet Take 2 tablets (40 mg total) by mouth 2 (two) times daily. 03/18/18   Delma Freeze, FNP    Allergies  Allergen Reactions  . Entresto [Sacubitril-Valsartan] Other (See Comments)    Low blood pressure  . Penicillins Other (See Comments)    Other  reaction(s): Unknown, pt does not know reactions    Past Surgical History:  Procedure Laterality Date  . APPENDECTOMY    . colonscopy  2010   benign polyps  . CORONARY ARTERY BYPASS GRAFT  1995  . CORONARY STENT INTERVENTION N/A 02/03/2017   Procedure: Coronary Stent Intervention;  Surgeon: Alwyn Pea, MD;  Location: [facility];  Service: Cardiovascular;  Laterality: N/A;  . ESOPHAGOGASTRODUODENOSCOPY  2010   normal  . LEFT HEART CATH AND CORONARY ANGIOGRAPHY N/A 02/03/2017   Procedure: Left Heart Cath and Coronary Angiography;  Surgeon: Alwyn Pea, MD;  Location: [facility];  Service: Cardiovascular;  Laterality: N/A;  . PERCUTANEOUS CORONARY STENT INTERVENTION (PCI-S)  2010, 2015   4 stents total    Social History   Tobacco Use  . Smoking status: Former Smoker    Types: Cigarettes    Last attempt to quit: 12/30/1994    Years since quitting: 23.4  . Smokeless tobacco: Never Used  Substance Use Topics  . Alcohol use: No    Alcohol/week: 0.0 oz  . Drug use: No     Medication list has been reviewed and updated.  Current Meds  Medication Sig  . ACCU-CHEK AVIVA PLUS test strip TEST BLOOD SUGAR EVERY DAY AS DIRECTED  . albuterol (PROVENTIL HFA;VENTOLIN HFA) 108 (90 Base) MCG/ACT inhaler Inhale  1-2 puffs into the lungs every 6 (six) hours as needed for wheezing or shortness of breath.  . allopurinol (ZYLOPRIM) 300 MG tablet TAKE 1 TABLET BY MOUTH EVERY DAY  . aspirin EC 81 MG tablet Take 81 mg by mouth daily. Reported on 04/10/2016  . atorvastatin (LIPITOR) 80 MG tablet Take 1 tablet (80 mg total) by mouth at bedtime.  . carvedilol (COREG) 12.5 MG tablet Take 1 tablet (12.5 mg total) by mouth 2 (two) times daily with a meal.  . clopidogrel (PLAVIX) 75 MG tablet Take 1 tablet (75 mg total) by mouth daily.  . empagliflozin (JARDIANCE) 25 MG TABS tablet Take 25 mg by mouth daily.  Marland Kitchen. glipiZIDE (GLUCOTROL XL) 10 MG 24 hr tablet Take 1 tablet (10 mg total)  by mouth daily.  Marland Kitchen. KOMBIGLYZE XR 2.04-999 MG TB24 TAKE 2 TABLETS BY MOUTH DAILY  . nitroGLYCERIN (NITROSTAT) 0.4 MG SL tablet Place 1 tablet (0.4 mg total) under the tongue every 5 (five) minutes as needed for chest pain. (Patient taking differently: Place 0.4 mg under the tongue every 5 (five) minutes as needed for chest pain. )  . pantoprazole (PROTONIX) 40 MG tablet Take 40 mg by mouth 2 (two) times daily.   . potassium chloride SA (K-DUR,KLOR-CON) 20 MEQ tablet Take 40 mEq by mouth daily.   Marland Kitchen. torsemide (DEMADEX) 20 MG tablet Take 2 tablets (40 mg total) by mouth 2 (two) times daily.  . [DISCONTINUED] atorvastatin (LIPITOR) 80 MG tablet TAKE 1 TABLET BY MOUTH AT BEDTIME  . [DISCONTINUED] carvedilol (COREG) 12.5 MG tablet TAKE 1 TABLET(12.5 MG) BY MOUTH TWICE DAILY WITH A MEAL  . [DISCONTINUED] empagliflozin (JARDIANCE) 25 MG TABS tablet Take 25 mg by mouth daily.  . [DISCONTINUED] glipiZIDE (GLUCOTROL XL) 10 MG 24 hr tablet TAKE 1 TABLET BY MOUTH EVERY DAY    PHQ 2/9 Scores 04/06/2018 02/23/2018 12/01/2017 11/24/2017  PHQ - 2 Score 0 0 0 0   Wt Readings from Last 3 Encounters:  06/12/18 188 lb (85.3 kg)  04/06/18 190 lb 8 oz (86.4 kg)  02/23/18 192 lb (87.1 kg)    Physical Exam  Constitutional: He is oriented to person, place, and time. He appears well-developed. No distress.  HENT:  Head: Normocephalic and atraumatic.  Neck: Carotid bruit is not present. No edema present.  Cardiovascular: Normal rate, regular rhythm, intact distal pulses and normal pulses. Exam reveals distant heart sounds.  No murmur heard. Pulmonary/Chest: Effort normal and breath sounds normal. No respiratory distress. He has no wheezes. He has no rhonchi.  Musculoskeletal: Normal range of motion.  Neurological: He is alert and oriented to person, place, and time.  Skin: Skin is warm and dry. No rash noted.  Psychiatric: He has a normal mood and affect. His behavior is normal. Thought content normal.  Nursing note  and vitals reviewed.   BP 138/88   Pulse 79   Temp 98 F (36.7 C) (Oral)   Resp 16   Ht 5\' 9"  (1.753 m)   Wt 188 lb (85.3 kg)   SpO2 98%   BMI 27.76 kg/m   Assessment and Plan: 1. Uncontrolled type 2 diabetes mellitus without complication, without long-term current use of insulin (HCC) Continue Jardiance, Kombiglyze and glucotrol Pt urged not to take discontinued meds unless instructed by MD Pt to schedule diabetic eye exam - Basic metabolic panel - Hemoglobin A1c - Microalbumin / creatinine urine ratio - empagliflozin (JARDIANCE) 25 MG TABS tablet; Take 25 mg by mouth daily.  Dispense:  30 tablet; Refill: 2 - glipiZIDE (GLUCOTROL XL) 10 MG 24 hr tablet; Take 1 tablet (10 mg total) by mouth daily.  Dispense: 90 tablet; Refill: 1  2. Essential hypertension controlled  3. Chronic systolic congestive heart failure (HCC) Appears euvolemic - carvedilol (COREG) 12.5 MG tablet; Take 1 tablet (12.5 mg total) by mouth 2 (two) times daily with a meal.  Dispense: 180 tablet; Refill: 1  4. Mixed hyperlipidemia On statin therapy - atorvastatin (LIPITOR) 80 MG tablet; Take 1 tablet (80 mg total) by mouth at bedtime.  Dispense: 90 tablet; Refill: 1  5. Tobacco use disorder, mild, in sustained remission, abuse Still not smoking  6. Colon cancer screening Unable to do easily due to Plavix therapy Should be able to stop briefly in the near future - Fecal occult blood, imunochemical   Meds ordered this encounter  Medications  . atorvastatin (LIPITOR) 80 MG tablet    Sig: Take 1 tablet (80 mg total) by mouth at bedtime.    Dispense:  90 tablet    Refill:  1  . carvedilol (COREG) 12.5 MG tablet    Sig: Take 1 tablet (12.5 mg total) by mouth 2 (two) times daily with a meal.    Dispense:  180 tablet    Refill:  1  . empagliflozin (JARDIANCE) 25 MG TABS tablet    Sig: Take 25 mg by mouth daily.    Dispense:  30 tablet    Refill:  2  . glipiZIDE (GLUCOTROL XL) 10 MG 24 hr tablet     Sig: Take 1 tablet (10 mg total) by mouth daily.    Dispense:  90 tablet    Refill:  1    Partially dictated using Animal nutritionist. Any errors are unintentional.  Bari Edward, MD [facility]  06/12/2018

## 2018-06-13 LAB — BASIC METABOLIC PANEL
BUN / CREAT RATIO: 16 (ref 10–24)
BUN: 21 mg/dL (ref 8–27)
CO2: 26 mmol/L (ref 20–29)
Calcium: 9.6 mg/dL (ref 8.6–10.2)
Chloride: 95 mmol/L — ABNORMAL LOW (ref 96–106)
Creatinine, Ser: 1.3 mg/dL — ABNORMAL HIGH (ref 0.76–1.27)
GFR calc Af Amer: 65 mL/min/{1.73_m2} (ref >59)
GFR calc non Af Amer: 56 mL/min/{1.73_m2} — ABNORMAL LOW (ref >59)
GLUCOSE: 166 mg/dL — AB (ref 65–99)
POTASSIUM: 4.5 mmol/L (ref 3.5–5.2)
SODIUM: 139 mmol/L (ref 134–144)

## 2018-06-13 LAB — HEMOGLOBIN A1C
Est. average glucose Bld gHb Est-mCnc: 200 mg/dL
HEMOGLOBIN A1C: 8.6 % — AB (ref 4.8–5.6)

## 2018-06-15 ENCOUNTER — Ambulatory Visit: Payer: Medicare Other

## 2018-06-15 ENCOUNTER — Telehealth: Payer: Self-pay

## 2018-06-15 ENCOUNTER — Ambulatory Visit: Payer: Self-pay

## 2018-06-15 NOTE — Telephone Encounter (Signed)
Opened in error

## 2018-06-17 ENCOUNTER — Encounter: Payer: Self-pay | Admitting: Internal Medicine

## 2018-06-17 ENCOUNTER — Ambulatory Visit (INDEPENDENT_AMBULATORY_CARE_PROVIDER_SITE_OTHER): Payer: Medicare Other | Admitting: Internal Medicine

## 2018-06-17 VITALS — BP 126/76 | HR 70 | Temp 98.3°F | Resp 16 | Ht 69.0 in | Wt 190.0 lb

## 2018-06-17 DIAGNOSIS — IMO0001 Reserved for inherently not codable concepts without codable children: Secondary | ICD-10-CM

## 2018-06-17 DIAGNOSIS — E1165 Type 2 diabetes mellitus with hyperglycemia: Secondary | ICD-10-CM | POA: Diagnosis not present

## 2018-06-17 LAB — MICROALBUMIN / CREATININE URINE RATIO
CREATININE, UR: 18.9 mg/dL
Microalb/Creat Ratio: <15.9 mg/g creat (ref 0.0–30.0)

## 2018-06-17 LAB — SPECIMEN STATUS REPORT

## 2018-06-17 MED ORDER — "PEN NEEDLES 5/16"" 31G X 8 MM MISC": 1.0000 | Freq: Every day | 3 refills | Status: DC

## 2018-06-17 MED ORDER — INSULIN GLARGINE 100 UNIT/ML SOLOSTAR PEN: 10.0000 [IU] | PEN_INJECTOR | Freq: Every day | SUBCUTANEOUS | 99 refills | Status: DC

## 2018-06-17 NOTE — Progress Notes (Signed)
Date:  06/17/2018   Name:  Gary Gutierrez   DOB:  December 07, 1951   MRN:  [id number]   Chief Complaint: Diabetes Diabetes  He presents for his follow-up diabetic visit. He has type 2 diabetes mellitus. His disease course has been worsening. There are no hypoglycemic associated symptoms. Pertinent negatives for hypoglycemia include no dizziness or headaches. Pertinent negatives for diabetes include no chest pain and no fatigue. Current diabetic treatments: 4 oral agents. He is compliant with treatment most of the time.  He is here to discuss starting insulin therapy.  Lab Results  Component Value Date   HGBA1C 8.6 (H) 06/12/2018      Review of Systems  Constitutional: Negative for chills, fatigue and fever.  Eyes: Negative for visual disturbance.  Respiratory: Negative for chest tightness and shortness of breath.   Cardiovascular: Negative for chest pain and palpitations.  Neurological: Negative for dizziness and headaches.    Patient Active Problem List   Diagnosis Date Noted  . Chronic obstructive pulmonary disease (HCC) 02/11/2018  . CHF (congestive heart failure) (HCC) 10/29/2017  . Diabetic autonomic neuropathy associated with type 2 diabetes mellitus (HCC) 07/01/2017  . DJD (degenerative joint disease), lumbosacral 07/01/2017  . PAD (peripheral artery disease) (HCC) 06/30/2017  . Tobacco use disorder, mild, in sustained remission, abuse 04/10/2017  . Uncontrolled type 2 diabetes mellitus without complication, without long-term current use of insulin (HCC) 08/12/2016  . Hx of colonic polyps 04/10/2016  . Hearing loss of both ears 01/03/2016  . Mixed hyperlipidemia 06/09/2015  . Essential hypertension 06/09/2015  . Multilevel degenerative disc disease 06/09/2015  . Coronary artery disease involving native coronary artery of native heart with angina pectoris (HCC) 05/09/2014  . Coronary artery disease involving autologous vein bypass graft 05/09/2014    Prior to  Admission medications   Medication Sig Start Date End Date Taking? Authorizing Provider  ACCU-CHEK AVIVA PLUS test strip TEST BLOOD SUGAR EVERY DAY AS DIRECTED 01/15/18   [name], MD  albuterol (PROVENTIL HFA;VENTOLIN HFA) 108 (90 Base) MCG/ACT inhaler Inhale 1-2 puffs into the lungs every 6 (six) hours as needed for wheezing or shortness of breath. 10/21/17   [name], DO  allopurinol (ZYLOPRIM) 300 MG tablet TAKE 1 TABLET BY MOUTH EVERY DAY 12/03/17   [name], MD  aspirin EC 81 MG tablet Take 81 mg by mouth daily. Reported on 04/10/2016    [provider]  atorvastatin (LIPITOR) 80 MG tablet Take 1 tablet (80 mg total) by mouth at bedtime. 06/12/18   [name], MD  carvedilol (COREG) 12.5 MG tablet Take 1 tablet (12.5 mg total) by mouth 2 (two) times daily with a meal. 06/12/18   [name], MD  clopidogrel (PLAVIX) 75 MG tablet Take 1 tablet (75 mg total) by mouth daily. 02/23/18   Delma Freeze, FNP  empagliflozin (JARDIANCE) 25 MG TABS tablet Take 25 mg by mouth daily. 06/12/18   [name], MD  glipiZIDE (GLUCOTROL XL) 10 MG 24 hr tablet Take 1 tablet (10 mg total) by mouth daily. 06/12/18   [name], MD  isosorbide mononitrate (IMDUR) 30 MG 24 hr tablet Take 1 tablet (30 mg total) by mouth daily. 02/13/17 04/06/18  Jennye Moccasin, MD  KOMBIGLYZE XR 2.04-999 MG TB24 TAKE 2 TABLETS BY MOUTH DAILY 04/17/18   [name], MD  nitroGLYCERIN (NITROSTAT) 0.4 MG SL tablet Place 1 tablet (0.4 mg total) under the tongue every 5 (five)  minutes as needed for chest pain. Patient taking differently: Place 0.4 mg under the tongue every 5 (five) minutes as needed for chest pain.  12/01/17   Delma Freeze, FNP  pantoprazole (PROTONIX) 40 MG tablet Take 40 mg by mouth 2 (two) times daily.  09/11/17   [provider]  potassium chloride SA (K-DUR,KLOR-CON) 20 MEQ tablet Take 40 mEq by mouth daily.     [provider]  torsemide  (DEMADEX) 20 MG tablet Take 2 tablets (40 mg total) by mouth 2 (two) times daily. 03/18/18   Delma Freeze, FNP    Allergies  Allergen Reactions  . Entresto [Sacubitril-Valsartan] Other (See Comments)    Low blood pressure  . Penicillins Other (See Comments)    Other reaction(s): Unknown, pt does not know reactions    Past Surgical History:  Procedure Laterality Date  . APPENDECTOMY    . colonscopy  2010   benign polyps  . CORONARY ARTERY BYPASS GRAFT  1995  . CORONARY STENT INTERVENTION N/A 02/03/2017   Procedure: Coronary Stent Intervention;  Surgeon: Alwyn Pea, MD;  Location: [facility];  Service: Cardiovascular;  Laterality: N/A;  . ESOPHAGOGASTRODUODENOSCOPY  2010   normal  . LEFT HEART CATH AND CORONARY ANGIOGRAPHY N/A 02/03/2017   Procedure: Left Heart Cath and Coronary Angiography;  Surgeon: Alwyn Pea, MD;  Location: [facility];  Service: Cardiovascular;  Laterality: N/A;  . PERCUTANEOUS CORONARY STENT INTERVENTION (PCI-S)  2010, 2015   4 stents total    Social History   Tobacco Use  . Smoking status: Former Smoker    Types: Cigarettes    Last attempt to quit: 12/30/1994    Years since quitting: 23.4  . Smokeless tobacco: Never Used  Substance Use Topics  . Alcohol use: No    Alcohol/week: 0.0 oz  . Drug use: No     Medication list has been reviewed and updated.  Current Meds  Medication Sig  . ACCU-CHEK AVIVA PLUS test strip TEST BLOOD SUGAR EVERY DAY AS DIRECTED  . albuterol (PROVENTIL HFA;VENTOLIN HFA) 108 (90 Base) MCG/ACT inhaler Inhale 1-2 puffs into the lungs every 6 (six) hours as needed for wheezing or shortness of breath.  . allopurinol (ZYLOPRIM) 300 MG tablet TAKE 1 TABLET BY MOUTH EVERY DAY  . aspirin EC 81 MG tablet Take 81 mg by mouth daily. Reported on 04/10/2016  . atorvastatin (LIPITOR) 80 MG tablet Take 1 tablet (80 mg total) by mouth at bedtime.  . carvedilol (COREG) 12.5 MG tablet Take 1 tablet (12.5 mg  total) by mouth 2 (two) times daily with a meal.  . clopidogrel (PLAVIX) 75 MG tablet Take 1 tablet (75 mg total) by mouth daily.  . empagliflozin (JARDIANCE) 25 MG TABS tablet Take 25 mg by mouth daily.  Marland Kitchen glipiZIDE (GLUCOTROL XL) 10 MG 24 hr tablet Take 1 tablet (10 mg total) by mouth daily.  Marland Kitchen KOMBIGLYZE XR 2.04-999 MG TB24 TAKE 2 TABLETS BY MOUTH DAILY  . nitroGLYCERIN (NITROSTAT) 0.4 MG SL tablet Place 1 tablet (0.4 mg total) under the tongue every 5 (five) minutes as needed for chest pain. (Patient taking differently: Place 0.4 mg under the tongue every 5 (five) minutes as needed for chest pain. )  . pantoprazole (PROTONIX) 40 MG tablet Take 40 mg by mouth 2 (two) times daily.   . potassium chloride SA (K-DUR,KLOR-CON) 20 MEQ tablet Take 40 mEq by mouth daily.   Marland Kitchen torsemide (DEMADEX) 20 MG tablet Take  2 tablets (40 mg total) by mouth 2 (two) times daily.    PHQ 2/9 Scores 04/06/2018 02/23/2018 12/01/2017 11/24/2017  PHQ - 2 Score 0 0 0 0    Physical Exam  Constitutional: He is oriented to person, place, and time. He appears well-developed. No distress.  HENT:  Head: Normocephalic and atraumatic.  Cardiovascular: Normal rate, regular rhythm and normal heart sounds.  Pulmonary/Chest: Effort normal and breath sounds normal. No respiratory distress.  Musculoskeletal: Normal range of motion.  Neurological: He is alert and oriented to person, place, and time.  Skin: Skin is warm and dry. No rash noted.  Psychiatric: He has a normal mood and affect. His behavior is normal. Thought content normal.  Nursing note and vitals reviewed.   BP 126/76   Pulse 70   Temp 98.3 F (36.8 C) (Oral)   Resp 16   Ht 5\' 9"  (1.753 m)   Wt 190 lb (86.2 kg)   SpO2 98%   BMI 28.06 kg/m   Assessment and Plan: 1. Uncontrolled type 2 diabetes mellitus without complication, without long-term current use of insulin (HCC) Begin insulin 10 units Call me in 2 weeks to report BS and get titration  instructions Will change kombiglyze to metformin

## 2018-06-17 NOTE — Patient Instructions (Addendum)
Begin Lantus insulin 10 units every morning Test BS every morning and record it in the book Call me in 2 weeks to report your blood sugars  Finish current bottle on kombiglyze then start metformin ER 1000 mg - 2 per day.

## 2018-06-18 LAB — FECAL OCCULT BLOOD, IMMUNOCHEMICAL: Fecal Occult Bld: NEGATIVE

## 2018-06-19 DIAGNOSIS — Z951 Presence of aortocoronary bypass graft: Secondary | ICD-10-CM | POA: Diagnosis not present

## 2018-06-19 DIAGNOSIS — I208 Other forms of angina pectoris: Secondary | ICD-10-CM | POA: Diagnosis not present

## 2018-06-19 DIAGNOSIS — K219 Gastro-esophageal reflux disease without esophagitis: Secondary | ICD-10-CM | POA: Diagnosis not present

## 2018-06-19 DIAGNOSIS — I10 Essential (primary) hypertension: Secondary | ICD-10-CM | POA: Diagnosis not present

## 2018-06-19 DIAGNOSIS — R0602 Shortness of breath: Secondary | ICD-10-CM | POA: Diagnosis not present

## 2018-06-19 DIAGNOSIS — Z87891 Personal history of nicotine dependence: Secondary | ICD-10-CM | POA: Diagnosis not present

## 2018-06-19 DIAGNOSIS — I5022 Chronic systolic (congestive) heart failure: Secondary | ICD-10-CM | POA: Diagnosis not present

## 2018-06-19 DIAGNOSIS — I2510 Atherosclerotic heart disease of native coronary artery without angina pectoris: Secondary | ICD-10-CM | POA: Diagnosis not present

## 2018-06-19 DIAGNOSIS — Z955 Presence of coronary angioplasty implant and graft: Secondary | ICD-10-CM | POA: Diagnosis not present

## 2018-06-19 DIAGNOSIS — E7849 Other hyperlipidemia: Secondary | ICD-10-CM | POA: Diagnosis not present

## 2018-06-19 DIAGNOSIS — M1990 Unspecified osteoarthritis, unspecified site: Secondary | ICD-10-CM | POA: Diagnosis not present

## 2018-06-19 DIAGNOSIS — I429 Cardiomyopathy, unspecified: Secondary | ICD-10-CM | POA: Diagnosis not present

## 2018-07-13 ENCOUNTER — Ambulatory Visit: Payer: Medicare Other

## 2018-07-14 ENCOUNTER — Encounter: Payer: Self-pay | Admitting: Family

## 2018-07-14 ENCOUNTER — Ambulatory Visit: Payer: Medicare Other | Attending: Family | Admitting: Family

## 2018-07-14 VITALS — BP 99/62 | HR 90 | Resp 18 | Ht 70.0 in | Wt 191.4 lb

## 2018-07-14 DIAGNOSIS — E785 Hyperlipidemia, unspecified: Secondary | ICD-10-CM | POA: Diagnosis not present

## 2018-07-14 DIAGNOSIS — Z87891 Personal history of nicotine dependence: Secondary | ICD-10-CM | POA: Diagnosis not present

## 2018-07-14 DIAGNOSIS — I5022 Chronic systolic (congestive) heart failure: Secondary | ICD-10-CM | POA: Diagnosis not present

## 2018-07-14 DIAGNOSIS — R0602 Shortness of breath: Secondary | ICD-10-CM | POA: Insufficient documentation

## 2018-07-14 DIAGNOSIS — Z7982 Long term (current) use of aspirin: Secondary | ICD-10-CM | POA: Insufficient documentation

## 2018-07-14 DIAGNOSIS — E119 Type 2 diabetes mellitus without complications: Secondary | ICD-10-CM | POA: Insufficient documentation

## 2018-07-14 DIAGNOSIS — Z79899 Other long term (current) drug therapy: Secondary | ICD-10-CM | POA: Diagnosis not present

## 2018-07-14 DIAGNOSIS — I2510 Atherosclerotic heart disease of native coronary artery without angina pectoris: Secondary | ICD-10-CM | POA: Diagnosis not present

## 2018-07-14 DIAGNOSIS — Z794 Long term (current) use of insulin: Secondary | ICD-10-CM | POA: Diagnosis not present

## 2018-07-14 DIAGNOSIS — I110 Hypertensive heart disease with heart failure: Secondary | ICD-10-CM | POA: Insufficient documentation

## 2018-07-14 DIAGNOSIS — I10 Essential (primary) hypertension: Secondary | ICD-10-CM

## 2018-07-14 DIAGNOSIS — E1165 Type 2 diabetes mellitus with hyperglycemia: Secondary | ICD-10-CM

## 2018-07-14 DIAGNOSIS — IMO0001 Reserved for inherently not codable concepts without codable children: Secondary | ICD-10-CM

## 2018-07-14 NOTE — Patient Instructions (Signed)
Continue weighing daily and call for an overnight weight gain of > 2 pounds or a weekly weight gain of >5 pounds. 

## 2018-07-14 NOTE — Progress Notes (Signed)
Patient ID: Gary Gutierrez, male    DOB: April 06, 1951, 67 y.o.   MRN: [id number]  HPI  Gary Gutierrez is a 66 y/o male with a history of CAD, DM, hyperlipidemia, HTN, remote tobacco use and chronic heart failure.   Echo report from 10/29/17 reviewed and shows an EF of 20-25%. EF has declined from previous echo February 2018. Cardiac catheterization done on 02/03/17 showed multivessel disease. Successful PCI with DES from SVG to circumflex.    Has not been admitted or been in the ED in the last 6 months.   He presents today for a follow-up visit with a chief complaint of minimal shortness of breath upon moderate exertion. He describes this as chronic in nature having been present for several months. He has associated fatigue, cough and chronic pain along with this. He denies any difficulty sleeping, abdominal distention, palpitations, pedal edema, chest pain, dizziness or weight gain.   Past Medical History:  Diagnosis Date  . Allergy   . CHF (congestive heart failure) (HCC)   . Coronary artery disease   . Diabetes mellitus without complication (HCC)   . Hyperlipidemia   . Hypertension    Past Surgical History:  Procedure Laterality Date  . APPENDECTOMY    . colonscopy  2010   benign polyps  . CORONARY ARTERY BYPASS GRAFT  1995  . CORONARY STENT INTERVENTION N/A 02/03/2017   Procedure: Coronary Stent Intervention;  Surgeon: Alwyn Pea, MD;  Location: [facility];  Service: Cardiovascular;  Laterality: N/A;  . ESOPHAGOGASTRODUODENOSCOPY  2010   normal  . LEFT HEART CATH AND CORONARY ANGIOGRAPHY N/A 02/03/2017   Procedure: Left Heart Cath and Coronary Angiography;  Surgeon: Alwyn Pea, MD;  Location: [facility];  Service: Cardiovascular;  Laterality: N/A;  . PERCUTANEOUS CORONARY STENT INTERVENTION (PCI-S)  2010, 2015   4 stents total   Family History  Problem Relation Age of Onset  . Diabetes Father   . Diabetes Brother   . Prostate cancer Brother   .  Lung cancer Brother    Social History   Tobacco Use  . Smoking status: Former Smoker    Types: Cigarettes    Last attempt to quit: 12/30/1994    Years since quitting: 23.5  . Smokeless tobacco: Never Used  Substance Use Topics  . Alcohol use: No    Alcohol/week: 0.0 oz   Allergies  Allergen Reactions  . Entresto [Sacubitril-Valsartan] Other (See Comments)    Low blood pressure  . Penicillins Other (See Comments)    Other reaction(s): Unknown, pt does not know reactions   Prior to Admission medications   Medication Sig Start Date End Date Taking? Authorizing Provider  ACCU-CHEK AVIVA PLUS test strip TEST BLOOD SUGAR EVERY DAY AS DIRECTED 01/15/18  Yes [name], MD  allopurinol (ZYLOPRIM) 300 MG tablet TAKE 1 TABLET BY MOUTH EVERY DAY 12/03/17  Yes [name], MD  aspirin EC 81 MG tablet Take 81 mg by mouth daily. Reported on 04/10/2016   Yes [provider]  atorvastatin (LIPITOR) 80 MG tablet Take 1 tablet (80 mg total) by mouth at bedtime. 06/12/18  Yes [name], MD  carvedilol (COREG) 12.5 MG tablet Take 1 tablet (12.5 mg total) by mouth 2 (two) times daily with a meal. 06/12/18  Yes [name], MD  clopidogrel (PLAVIX) 75 MG tablet Take 1 tablet (75 mg total) by mouth daily. 02/23/18  Yes Delma Freeze, FNP  empagliflozin (JARDIANCE)  25 MG TABS tablet Take 25 mg by mouth daily. 06/12/18  Yes [name], MD  glipiZIDE (GLUCOTROL XL) 10 MG 24 hr tablet Take 1 tablet (10 mg total) by mouth daily. 06/12/18  Yes [name], MD  Insulin Glargine (LANTUS SOLOSTAR) 100 UNIT/ML Solostar Pen Inject 10-50 Units into the skin daily at 10 pm. 06/17/18  Yes [name], MD  Insulin Pen Needle (PEN NEEDLES 31GX5/16") 31G X 8 MM MISC 1 each by Does not apply route daily. 06/17/18  Yes [name], MD  isosorbide mononitrate (IMDUR) 30 MG 24 hr tablet Take 1 tablet (30 mg total) by mouth daily. 02/13/17 07/14/18 Yes Jennye Moccasin, MD   KOMBIGLYZE XR 2.04-999 MG TB24 TAKE 2 TABLETS BY MOUTH DAILY 04/17/18  Yes [name], MD  nitroGLYCERIN (NITROSTAT) 0.4 MG SL tablet Place 1 tablet (0.4 mg total) under the tongue every 5 (five) minutes as needed for chest pain. Patient taking differently: Place 0.4 mg under the tongue every 5 (five) minutes as needed for chest pain.  12/01/17  Yes Hackney, Tina A, FNP  pantoprazole (PROTONIX) 40 MG tablet Take 40 mg by mouth daily.  09/11/17  Yes [provider]  potassium chloride SA (K-DUR,KLOR-CON) 20 MEQ tablet Take 40 mEq by mouth daily.    Yes [provider]  torsemide (DEMADEX) 20 MG tablet Take 2 tablets (40 mg total) by mouth 2 (two) times daily. 03/18/18  Yes Hackney, Inetta Fermo A, FNP  albuterol (PROVENTIL HFA;VENTOLIN HFA) 108 (90 Base) MCG/ACT inhaler Inhale 1-2 puffs into the lungs every 6 (six) hours as needed for wheezing or shortness of breath. Patient not taking: Reported on 07/14/2018 10/21/17   [name], DO    Review of Systems  Constitutional: Positive for fatigue. Negative for appetite change and fever.  HENT: Negative for congestion, postnasal drip and sore throat.   Eyes: Negative.   Respiratory: Positive for cough (productive cough) and shortness of breath. Negative for chest tightness.   Cardiovascular: Negative for chest pain, palpitations and leg swelling.  Gastrointestinal: Negative for abdominal distention and abdominal pain.  Endocrine: Negative.   Genitourinary: Negative.   Musculoskeletal: Positive for back pain, myalgias (both upper arms "sore" due to fall) and neck pain.  Skin: Negative.   Allergic/Immunologic: Negative.   Neurological: Negative for dizziness and light-headedness.  Hematological: Negative for adenopathy. Does not bruise/bleed easily.  Psychiatric/Behavioral: Negative for dysphoric mood and sleep disturbance. The patient is not nervous/anxious.    Vitals:   07/14/18 1158  BP: 99/62  Pulse: 90  Resp: 18  SpO2:  99%  Weight: 191 lb 6 oz (86.8 kg)  Height: 5\' 10"  (1.778 m)   Wt Readings from Last 3 Encounters:  07/14/18 191 lb 6 oz (86.8 kg)  06/17/18 190 lb (86.2 kg)  06/12/18 188 lb (85.3 kg)   Lab Results  Component Value Date   CREATININE 1.30 (H) 06/12/2018   CREATININE 1.24 02/11/2018   CREATININE 1.31 (H) 12/01/2017    Physical Exam  Constitutional: He is oriented to person, place, and time. He appears well-developed and well-nourished.  HENT:  Head: Normocephalic and atraumatic.  Neck: Normal range of motion. Neck supple. No JVD present.  Cardiovascular: Normal rate and regular rhythm.  Pulmonary/Chest: Effort normal. No respiratory distress. He has no wheezes. He has no rales.  Abdominal: Soft. He exhibits no distension. There is no tenderness.  Musculoskeletal: He exhibits no edema or tenderness.  Neurological: He is alert and  oriented to person, place, and time.  Skin: Skin is warm and dry.  Psychiatric: He has a normal mood and affect. His behavior is normal. Thought content normal.  Nursing note and vitals reviewed.  Assessment & Plan:  1: Chronic heart failure with reduced ejection fraction- - NYHA class II - euvolemic today - weighing daily. Reminded to call for an overnight weight gain of >2 pounds or a weekly weight gain of >5 pounds - weight stable from last time he was here ~ 3 months ago - not adding salt and has been reading food labels. Discussed closely following a 2000mg  sodium diet  - saw cardiologist Juliann Pares([place]) 06/19/18 and is supposed to have echo done in a few weeks - patient was unable to tolerate entresto as it made his BP too low & made him feel "bad" so he stopped it and started feeling better - current BP will not tolerate initiation of ACE-I/ ARB  2: HTN- - BP on the low side today - says that his BP at home had been low due to entresto - saw PCP [name]([place]) 06/17/18 - BMP from 06/12/18 reviewed and showed sodium 139, potassium 4.5 and GFR 56  3:  Diabetes- - glucose at home today was 224 - A1c on 06/12/18 was 8.6% (Previously was 7.5%)  Patient did not bring his medications nor a list. Each medication was verbally reviewed with the patient and he was encouraged to bring the bottles to every visit to confirm accuracy of list.  Return in 6 months or sooner for any questions/problems before then.

## 2018-07-18 ENCOUNTER — Other Ambulatory Visit: Payer: Self-pay | Admitting: Internal Medicine

## 2018-07-20 DIAGNOSIS — I5022 Chronic systolic (congestive) heart failure: Secondary | ICD-10-CM | POA: Diagnosis not present

## 2018-07-20 DIAGNOSIS — R0602 Shortness of breath: Secondary | ICD-10-CM | POA: Diagnosis not present

## 2018-07-20 DIAGNOSIS — I429 Cardiomyopathy, unspecified: Secondary | ICD-10-CM | POA: Diagnosis not present

## 2018-07-22 ENCOUNTER — Ambulatory Visit (INDEPENDENT_AMBULATORY_CARE_PROVIDER_SITE_OTHER): Payer: Medicare Other

## 2018-07-22 VITALS — BP 104/60 | HR 80 | Temp 98.0°F | Resp 12 | Ht 69.0 in | Wt 189.8 lb

## 2018-07-22 DIAGNOSIS — IMO0001 Reserved for inherently not codable concepts without codable children: Secondary | ICD-10-CM

## 2018-07-22 DIAGNOSIS — E1165 Type 2 diabetes mellitus with hyperglycemia: Secondary | ICD-10-CM

## 2018-07-22 DIAGNOSIS — Z0000 Encounter for general adult medical examination without abnormal findings: Secondary | ICD-10-CM | POA: Diagnosis not present

## 2018-07-22 NOTE — Progress Notes (Signed)
Subjective:   Gary Gutierrez is a 67 y.o. male who presents for Medicare Annual/Subsequent preventive examination.  Review of Systems:  N/A Cardiac Risk Factors include: advanced age (>81men, >68 women);diabetes mellitus;dyslipidemia;hypertension;male gender;sedentary lifestyle     Objective:    Vitals: BP 104/60 (BP Location: Right Arm, Patient Position: Sitting, Cuff Size: Normal)   Pulse 80   Temp 98 F (36.7 C) (Oral)   Resp 12   Ht 5\' 9"  (1.753 m)   Wt 189 lb 12.8 oz (86.1 kg)   SpO2 94%   BMI 28.03 kg/m   Body mass index is 28.03 kg/m.  Advanced Directives 07/22/2018 10/29/2017 10/21/2017 06/30/2017 02/02/2017 01/03/2016  Does Patient Have a Medical Advance Directive? No No No No No No  Would patient like information on creating a medical advance directive? Yes (MAU/Ambulatory/Procedural Areas - Information given) No - Patient declined - - No - Patient declined -    Tobacco Social History   Tobacco Use  Smoking Status Former Smoker  . Packs/day: 1.50  . Years: 29.00  . Pack years: 43.50  . Types: Cigarettes  . Last attempt to quit: 12/30/1994  . Years since quitting: 23.5  Smokeless Tobacco Never Used  Tobacco Comment   smoking cessation materials not required     Counseling given: No Comment: smoking cessation materials not required  Clinical Intake:  Pre-visit preparation completed: Yes  Pain : No/denies pain   BMI - recorded: 28.03 Nutritional Status: BMI 25 -29 Overweight Nutritional Risks: None  Pt mentioned having a sore on his R anterior foot. Wound 1.0cm x 1.0cm and beefy red, and without drainage. No redness, warmth or swelling noted to surrounding tissue. Denies having any pain or tenderness at the site. Advised to wash area daily with soap and water, pat dry and LOTA. Also recommended f/u with Dr. [name] within 2-3 days for further evaluation.   Nutrition Risk Assessment: Has the patient had any N/V/D within the last 2 months?  No Does the  patient have any non-healing wounds?  No Has the patient had any unintentional weight loss or weight gain?  No  Is the patient diabetic?  Yes If diabetic, was a CBG obtained today?  No Did the patient bring in their glucometer from home?  No Comments: Pt monitors CBG's daily. Denies any financial strains with the device or supplies. Pt states his CBG's have been around 200 daily. Recently started Lantus but does not feel this is controlling his blood glucose. Given recent wound to R anterior foot and his concerns about his CBG's, pt has been advised to schedule an appt within 2-3 days for further evaluation.   Diabetic Exams: Diabetic Eye Exam: Overdue for diabetic eye exam. Pt has been advised about the importance in completing this exam. A referral has been placed today. Message sent to referral coordinator for scheduling purposes. Advised pt to expect a call from our office re: appt. Diabetic Foot Exam: Completed 02/11/18.   How often do you need to have someone help you when you read instructions, pamphlets, or other written materials from your doctor or pharmacy?: 1 - Never  Interpreter Needed?: No  Information entered by :: Martina Sinner, LPN  Past Medical History:  Diagnosis Date  . Allergy   . CHF (congestive heart failure) (HCC)   . Coronary artery disease   . Diabetes mellitus without complication (HCC)   . Hyperlipidemia   . Hypertension    Past Surgical History:  Procedure Laterality Date  . APPENDECTOMY    .  colonscopy  2010   benign polyps  . CORONARY ARTERY BYPASS GRAFT  1995  . CORONARY STENT INTERVENTION N/A 02/03/2017   Procedure: Coronary Stent Intervention;  Surgeon: Alwyn Pea, MD;  Location: [facility] INVASIVE CV [facility];  Service: Cardiovascular;  Laterality: N/A;  . ESOPHAGOGASTRODUODENOSCOPY  2010   normal  . LEFT HEART CATH AND CORONARY ANGIOGRAPHY N/A 02/03/2017   Procedure: Left Heart Cath and Coronary Angiography;  Surgeon: Alwyn Pea, MD;  Location:  [facility] INVASIVE CV [facility];  Service: Cardiovascular;  Laterality: N/A;  . PERCUTANEOUS CORONARY STENT INTERVENTION (PCI-S)  2010, 2015   4 stents total   Family History  Problem Relation Age of Onset  . Heart disease Father   . Lung cancer Brother   . Prostate cancer Brother   . Diabetes Mother   . Heart disease Mother   . Heart attack Mother   . Congestive Heart Failure Mother    Social History   Socioeconomic History  . Marital status: Legally Separated    Spouse name: Not on file  . Number of children: 8  . Years of education: Not on file  . Highest education level: 11th grade  Occupational History  . Occupation: Retired  Engineer, production  . Financial resource strain: Not hard at all  . Food insecurity:    Worry: Never true    Inability: Never true  . Transportation needs:    Medical: No    Non-medical: No  Tobacco Use  . Smoking status: Former Smoker    Packs/day: 1.50    Years: 29.00    Pack years: 43.50    Types: Cigarettes    Last attempt to quit: 12/30/1994    Years since quitting: 23.5  . Smokeless tobacco: Never Used  . Tobacco comment: smoking cessation materials not required  Substance and Sexual Activity  . Alcohol use: No    Alcohol/week: 0.0 oz  . Drug use: No  . Sexual activity: Not Currently  Lifestyle  . Physical activity:    Days per week: 0 days    Minutes per session: 0 min  . Stress: Not at all  Relationships  . Social connections:    Talks on phone: Patient refused    Gets together: Patient refused    Attends religious service: Patient refused    Active member of club or organization: Patient refused    Attends meetings of clubs or organizations: Patient refused    Relationship status: Married  Other Topics Concern  . Not on file  Social History Narrative  . Not on file    Outpatient Encounter Medications as of 07/22/2018  Medication Sig  . ACCU-CHEK AVIVA PLUS test strip TEST BLOOD SUGAR EVERY DAY AS DIRECTED  . albuterol (PROVENTIL  HFA;VENTOLIN HFA) 108 (90 Base) MCG/ACT inhaler Inhale 1-2 puffs into the lungs every 6 (six) hours as needed for wheezing or shortness of breath.  . allopurinol (ZYLOPRIM) 300 MG tablet TAKE 1 TABLET BY MOUTH EVERY DAY  . aspirin EC 81 MG tablet Take 81 mg by mouth daily. Reported on 04/10/2016  . atorvastatin (LIPITOR) 80 MG tablet Take 1 tablet (80 mg total) by mouth at bedtime.  . carvedilol (COREG) 12.5 MG tablet Take 1 tablet (12.5 mg total) by mouth 2 (two) times daily with a meal.  . clopidogrel (PLAVIX) 75 MG tablet Take 1 tablet (75 mg total) by mouth daily.  . empagliflozin (JARDIANCE) 25 MG TABS tablet Take 25 mg by mouth daily.  Marland Kitchen glipiZIDE (GLUCOTROL  XL) 10 MG 24 hr tablet TAKE 1 TABLET BY MOUTH EVERY DAY  . Insulin Glargine (LANTUS SOLOSTAR) 100 UNIT/ML Solostar Pen Inject 10-50 Units into the skin daily at 10 pm.  . Insulin Pen Needle (PEN NEEDLES 31GX5/16") 31G X 8 MM MISC 1 each by Does not apply route daily.  . isosorbide mononitrate (IMDUR) 30 MG 24 hr tablet Take 1 tablet (30 mg total) by mouth daily.  Marland Kitchen KOMBIGLYZE XR 2.04-999 MG TB24 TAKE 2 TABLETS BY MOUTH DAILY  . nitroGLYCERIN (NITROSTAT) 0.4 MG SL tablet Place 1 tablet (0.4 mg total) under the tongue every 5 (five) minutes as needed for chest pain. (Patient taking differently: Place 0.4 mg under the tongue every 5 (five) minutes as needed for chest pain. )  . pantoprazole (PROTONIX) 40 MG tablet Take 40 mg by mouth daily.   . potassium chloride SA (K-DUR,KLOR-CON) 20 MEQ tablet Take 40 mEq by mouth daily.   Marland Kitchen torsemide (DEMADEX) 20 MG tablet Take 2 tablets (40 mg total) by mouth 2 (two) times daily.   No facility-administered encounter medications on file as of 07/22/2018.     Activities of Daily Living In your present state of health, do you have any difficulty performing the following activities: 07/22/2018 07/14/2018  Hearing? N N  Comment denies hearing aids -  Vision? N N  Comment wears eyeglasses -  Difficulty  concentrating or making decisions? N N  Walking or climbing stairs? Y N  Comment dyspnea -  Dressing or bathing? N N  Doing errands, shopping? N N  Preparing Food and eating ? N -  Comment upper dentures -  Using the Toilet? N -  In the past six months, have you accidently leaked urine? N -  Do you have problems with loss of bowel control? N -  Managing your Medications? N -  Managing your Finances? N -  Housekeeping or managing your Housekeeping? N -  Some recent data might be hidden    Patient Care Team: [name], MD as PCP - General (Internal Medicine) Midge Minium, MD as Consulting Physician (Gastroenterology) Delma Freeze, FNP as Nurse Practitioner (Cardiology) Alwyn Pea, MD as Consulting Physician (Cardiology) Gilda Crease, Latina Craver, MD (Vascular Surgery)   Assessment:   This is a routine wellness examination for Gary Gutierrez.  Exercise Activities and Dietary recommendations Current Exercise Habits: The patient does not participate in regular exercise at present, Exercise limited by: None identified  Goals    . DIET - INCREASE WATER INTAKE     Recommend to drink at least 6-8 8oz glasses of water per day.       Fall Risk Fall Risk  07/22/2018 07/14/2018 04/06/2018 02/23/2018 12/01/2017  Falls in the past year? Yes No Yes No No  Comment slipped on ice - - - -  Number falls in past yr: 1 - 1 - -  Injury with Fall? No - No - -  Risk for fall due to : Impaired vision;Medication side effect - Medication side effect - -  Risk for fall due to: Comment wears eyeglasses - - - -  Follow up Falls evaluation completed;Education provided;Falls prevention discussed - - - -   FALL RISK PREVENTION PERTAINING TO HOME: Is your home free of loose throw rugs in walkways, pet beds, electrical cords, etc? Yes Is there adequate lighting in your home to reduce risk of falls?  Yes Are there stairs in or around your home WITH handrails? Yes  ASSISTIVE DEVICES UTILIZED TO  PREVENT  FALLS: Use of a cane, walker or w/c? Yes, cane on occassion Grab bars in the bathroom? Yes  Shower chair or a place to sit while bathing? Yes An elevated toilet seat or a handicapped toilet? No  Timed Get Up and Go Performed: Yes. Pt ambulated 10 feet within 12 sec. Gait slow, steady and without the use of an assistive device. No intervention required at this time. Fall risk prevention has been discussed.  Community Resource Referral:  Pt declined my offer to send [employer] Referral to Care Guide for an elevated toilet seat.  Depression Screen PHQ 2/9 Scores 07/22/2018 07/14/2018 04/06/2018 02/23/2018  PHQ - 2 Score 0 0 0 0  PHQ- 9 Score 0 - - -    Cognitive Function     6CIT Screen 07/22/2018 06/10/2017  What Year? 0 points 0 points  What month? 0 points 0 points  What time? 0 points 0 points  Count back from 20 0 points 0 points  Months in reverse 2 points 0 points  Repeat phrase 2 points 4 points  Total Score 4 4    Immunization History  Administered Date(s) Administered  . Influenza, High Dose Seasonal PF 10/10/2017  . Influenza,inj,Quad PF,6+ Mos 01/03/2016  . Pneumococcal Conjugate-13 04/10/2016  . Pneumococcal Polysaccharide-23 06/10/2017  . Tdap 08/16/2013    Qualifies for Shingles Vaccine? Yes. Due for Shingrix. Education has been provided regarding the importance of this vaccine. Pt has been advised to call insurance company to determine out of pocket expense. Advised may also receive vaccine at local pharmacy or Health Dept. Verbalized acceptance and understanding.  Screening Tests Health Maintenance  Topic Date Due  . OPHTHALMOLOGY EXAM  04/27/1961  . INFLUENZA VACCINE  07/30/2018  . HEMOGLOBIN A1C  12/12/2018  . FOOT EXAM  02/11/2019  . URINE MICROALBUMIN  06/13/2019  . COLONOSCOPY  06/19/2023  . TETANUS/TDAP  08/17/2023  . Hepatitis C Screening  Completed  . PNA vac Low Risk Adult  Completed   Cancer Screenings: Lung: Low Dose CT Chest  recommended if Age 40-80 years, 30 pack-year currently smoking OR have quit w/in 15years. Patient does not qualify. Colorectal: Completed FIT test 06/18/18. Repeat every 5 years. Pt is unable to proceed with colonoscopy until cleared by cardiology d/t recently beginning use of Plavix.  Additional Screenings: Hepatitis C Screening: Completed 06/10/17     Plan:  I have personally reviewed and addressed the Medicare Annual Wellness questionnaire and have noted the following in the patient's chart:  A. Medical and social history B. Use of alcohol, tobacco or illicit drugs  C. Current medications and supplements D. Functional ability and status E.  Nutritional status F.  Physical activity G. Advance directives H. List of other physicians I.  Hospitalizations, surgeries, and ER visits in previous 12 months J.  Vitals K. Screenings such as hearing and vision if needed, cognitive and depression L. Referrals and appointments  In addition, I have reviewed and discussed with patient certain preventive protocols, quality metrics, and best practice recommendations. A written personalized care plan for preventive services as well as general preventive health recommendations were provided to patient.  Signed,  Deon Pilling, LPN Nurse Health Advisor  MD Recommendations: Due for Shingrix. Education has been provided regarding the importance of this vaccine. Pt has been advised to call insurance company to determine out of pocket expense. Advised may also receive vaccine at local pharmacy or Health Dept. Verbalized acceptance and understanding.  Diabetic Eye Exam: Overdue for diabetic  eye exam. Pt has been advised about the importance in completing this exam. A referral has been placed today. Message sent to referral coordinator for scheduling purposes. Advised pt to expect a call from our office re: appt.  Pt mentioned having a sore on his R anterior foot. Wound 1.0cm x 1.0cm and beefy red, and without  drainage. No redness, warmth or swelling noted to surrounding tissue. Denies having any pain or tenderness at the site. Advised to wash area daily with soap and water, pat dry and LOTA. Also recommended f/u with Dr. Judithann GravesBerglund within 2-3 days for further evaluation. Pt also states he monitors CBG's daily. Denies any financial strains with the device or supplies. Expressed concern about CBG's being in the 200's daily. Recently started Lantus but does not feel this is controlling his blood glucose. Given recent wound to R anterior foot and his concerns about his CBG's, pt has been advised to schedule an appt with Dr. Judithann GravesBerglund within 2-3 days for further evaluation.

## 2018-07-22 NOTE — Patient Instructions (Addendum)
Gary Gutierrez , Thank you for taking time to come for your Medicare Wellness Visit. I appreciate your ongoing commitment to your health goals. Please review the following plan we discussed and let me know if I can assist you in the future.   Screening recommendations/referrals: Colorectal Screening: Up to date  Vision and Dental Exams: Recommended annual ophthalmology exams for early detection of glaucoma and other disorders of the eye Recommended annual dental exams for proper oral hygiene  Diabetic Exams: Recommended annual diabetic eye exams for early detection of retinopathy Recommended annual diabetic foot exams for early detection of peripheral neuropathy.  Diabetic Eye Exam: You will receive a call from our office regarding your appointment Diabetic Foot Exam: Up to date  Vaccinations: Influenza vaccine: Up to date Pneumococcal vaccine: Up to date Tdap vaccine: Up to date Shingles vaccine: Please call your insurance company to determine your out of pocket expense for the Shingrix vaccine. You may also receive this vaccine at your local pharmacy or Health Dept.    Advanced directives: Advance directive discussed with you today. I have provided a copy for you to complete at home and have notarized. Once this is complete please bring a copy in to our office so we can scan it into your chart.  Goals: Recommend to drink at least 6-8 8oz glasses of water per day.  Next appointment: Please schedule your Annual Wellness Visit with your Nurse Health Advisor in one year.  Preventive Care 9165 Years and Older, Male Preventive care refers to lifestyle choices and visits with your health care provider that can promote health and wellness. What does preventive care include?  A yearly physical exam. This is also called an annual well check.  Dental exams once or twice a year.  Routine eye exams. Ask your health care provider how often you should have your eyes checked.  Personal lifestyle  choices, including:  Daily care of your teeth and gums.  Regular physical activity.  Eating a healthy diet.  Avoiding tobacco and drug use.  Limiting alcohol use.  Practicing safe sex.  Taking low doses of aspirin every day.  Taking vitamin and mineral supplements as recommended by your health care provider. What happens during an annual well check? The services and screenings done by your health care provider during your annual well check will depend on your age, overall health, lifestyle risk factors, and family history of disease. Counseling  Your health care provider may ask you questions about your:  Alcohol use.  Tobacco use.  Drug use.  Emotional well-being.  Home and relationship well-being.  Sexual activity.  Eating habits.  History of falls.  Memory and ability to understand (cognition).  Work and work Astronomerenvironment. Screening  You may have the following tests or measurements:  Height, weight, and BMI.  Blood pressure.  Lipid and cholesterol levels. These may be checked every 5 years, or more frequently if you are over 68 years old.  Skin check.  Lung cancer screening. You may have this screening every year starting at age 68 if you have a 30-pack-year history of smoking and currently smoke or have quit within the past 15 years.  Fecal occult blood test (FOBT) of the stool. You may have this test every year starting at age 68.  Flexible sigmoidoscopy or colonoscopy. You may have a sigmoidoscopy every 5 years or a colonoscopy every 10 years starting at age 68.  Prostate cancer screening. Recommendations will vary depending on your family history and other risks.  Hepatitis C blood test.  Hepatitis B blood test.  Sexually transmitted disease (STD) testing.  Diabetes screening. This is done by checking your blood sugar (glucose) after you have not eaten for a while (fasting). You may have this done every 1-3 years.  Abdominal aortic aneurysm  (AAA) screening. You may need this if you are a current or former smoker.  Osteoporosis. You may be screened starting at age 32 if you are at high risk. Talk with your health care provider about your test results, treatment options, and if necessary, the need for more tests. Vaccines  Your health care provider may recommend certain vaccines, such as:  Influenza vaccine. This is recommended every year.  Tetanus, diphtheria, and acellular pertussis (Tdap, Td) vaccine. You may need a Td booster every 10 years.  Zoster vaccine. You may need this after age 85.  Pneumococcal 13-valent conjugate (PCV13) vaccine. One dose is recommended after age 31.  Pneumococcal polysaccharide (PPSV23) vaccine. One dose is recommended after age 85. Talk to your health care provider about which screenings and vaccines you need and how often you need them. This information is not intended to replace advice given to you by your health care provider. Make sure you discuss any questions you have with your health care provider. Document Released: 01/12/2016 Document Revised: 09/04/2016 Document Reviewed: 10/17/2015 Elsevier Interactive Patient Education  2017 [place] Prevention in the Home Falls can cause injuries. They can happen to people of all ages. There are many things you can do to make your home safe and to help prevent falls. What can I do on the outside of my home?  Regularly fix the edges of walkways and driveways and fix any cracks.  Remove anything that might make you trip as you walk through a door, such as a raised step or threshold.  Trim any bushes or trees on the path to your home.  Use bright outdoor lighting.  Clear any walking paths of anything that might make someone trip, such as rocks or tools.  Regularly check to see if handrails are loose or broken. Make sure that both sides of any steps have handrails.  Any raised decks and porches should have guardrails on the  edges.  Have any leaves, snow, or ice cleared regularly.  Use sand or salt on walking paths during winter.  Clean up any spills in your garage right away. This includes oil or grease spills. What can I do in the bathroom?  Use night lights.  Install grab bars by the toilet and in the tub and shower. Do not use towel bars as grab bars.  Use non-skid mats or decals in the tub or shower.  If you need to sit down in the shower, use a plastic, non-slip stool.  Keep the floor dry. Clean up any water that spills on the floor as soon as it happens.  Remove soap buildup in the tub or shower regularly.  Attach bath mats securely with double-sided non-slip rug tape.  Do not have throw rugs and other things on the floor that can make you trip. What can I do in the bedroom?  Use night lights.  Make sure that you have a light by your bed that is easy to reach.  Do not use any sheets or blankets that are too big for your bed. They should not hang down onto the floor.  Have a firm chair that has side arms. You can use this for support while you  get dressed.  Do not have throw rugs and other things on the floor that can make you trip. What can I do in the kitchen?  Clean up any spills right away.  Avoid walking on wet floors.  Keep items that you use a lot in easy-to-reach places.  If you need to reach something above you, use a strong step stool that has a grab bar.  Keep electrical cords out of the way.  Do not use floor polish or wax that makes floors slippery. If you must use wax, use non-skid floor wax.  Do not have throw rugs and other things on the floor that can make you trip. What can I do with my stairs?  Do not leave any items on the stairs.  Make sure that there are handrails on both sides of the stairs and use them. Fix handrails that are broken or loose. Make sure that handrails are as long as the stairways.  Check any carpeting to make sure that it is firmly  attached to the stairs. Fix any carpet that is loose or worn.  Avoid having throw rugs at the top or bottom of the stairs. If you do have throw rugs, attach them to the floor with carpet tape.  Make sure that you have a light switch at the top of the stairs and the bottom of the stairs. If you do not have them, ask someone to add them for you. What else can I do to help prevent falls?  Wear shoes that:  Do not have high heels.  Have rubber bottoms.  Are comfortable and fit you well.  Are closed at the toe. Do not wear sandals.  If you use a stepladder:  Make sure that it is fully opened. Do not climb a closed stepladder.  Make sure that both sides of the stepladder are locked into place.  Ask someone to hold it for you, if possible.  Clearly mark and make sure that you can see:  Any grab bars or handrails.  First and last steps.  Where the edge of each step is.  Use tools that help you move around (mobility aids) if they are needed. These include:  Canes.  Walkers.  Scooters.  Crutches.  Turn on the lights when you go into a dark area. Replace any light bulbs as soon as they burn out.  Set up your furniture so you have a clear path. Avoid moving your furniture around.  If any of your floors are uneven, fix them.  If there are any pets around you, be aware of where they are.  Review your medicines with your doctor. Some medicines can make you feel dizzy. This can increase your chance of falling. Ask your doctor what other things that you can do to help prevent falls. This information is not intended to replace advice given to you by your health care provider. Make sure you discuss any questions you have with your health care provider. Document Released: 10/12/2009 Document Revised: 05/23/2016 Document Reviewed: 01/20/2015 Elsevier Interactive Patient Education  2017 [employer].

## 2018-07-28 ENCOUNTER — Ambulatory Visit: Payer: Medicare Other | Admitting: Internal Medicine

## 2018-08-03 ENCOUNTER — Other Ambulatory Visit: Payer: Self-pay

## 2018-08-03 MED ORDER — POTASSIUM CHLORIDE CRYS ER 20 MEQ PO TBCR: 40.0000 meq | EXTENDED_RELEASE_TABLET | Freq: Every day | ORAL | 0 refills | Status: DC

## 2018-09-04 DIAGNOSIS — E119 Type 2 diabetes mellitus without complications: Secondary | ICD-10-CM | POA: Diagnosis not present

## 2018-09-04 LAB — HM DIABETES EYE EXAM

## 2018-09-10 ENCOUNTER — Other Ambulatory Visit: Payer: Self-pay | Admitting: Family

## 2018-09-14 ENCOUNTER — Other Ambulatory Visit: Payer: Self-pay | Admitting: Internal Medicine

## 2018-09-14 ENCOUNTER — Encounter: Payer: Self-pay | Admitting: Internal Medicine

## 2018-09-15 ENCOUNTER — Other Ambulatory Visit: Payer: Self-pay | Admitting: Internal Medicine

## 2018-09-15 DIAGNOSIS — E1165 Type 2 diabetes mellitus with hyperglycemia: Principal | ICD-10-CM

## 2018-09-15 DIAGNOSIS — IMO0001 Reserved for inherently not codable concepts without codable children: Secondary | ICD-10-CM

## 2018-10-12 ENCOUNTER — Ambulatory Visit: Payer: Medicare Other | Admitting: Internal Medicine

## 2018-10-13 ENCOUNTER — Other Ambulatory Visit: Payer: Self-pay | Admitting: Internal Medicine

## 2018-10-17 ENCOUNTER — Other Ambulatory Visit: Payer: Self-pay | Admitting: Internal Medicine

## 2018-10-18 ENCOUNTER — Other Ambulatory Visit: Payer: Self-pay | Admitting: Internal Medicine

## 2018-10-18 ENCOUNTER — Other Ambulatory Visit: Payer: Self-pay | Admitting: Family

## 2018-10-18 DIAGNOSIS — E1165 Type 2 diabetes mellitus with hyperglycemia: Principal | ICD-10-CM

## 2018-10-18 DIAGNOSIS — IMO0001 Reserved for inherently not codable concepts without codable children: Secondary | ICD-10-CM

## 2018-10-27 ENCOUNTER — Encounter: Payer: Self-pay | Admitting: Internal Medicine

## 2018-10-27 ENCOUNTER — Ambulatory Visit (INDEPENDENT_AMBULATORY_CARE_PROVIDER_SITE_OTHER): Payer: Medicare Other | Admitting: Internal Medicine

## 2018-10-27 VITALS — BP 124/82 | HR 81 | Ht 69.0 in | Wt 187.0 lb

## 2018-10-27 DIAGNOSIS — J449 Chronic obstructive pulmonary disease, unspecified: Secondary | ICD-10-CM | POA: Diagnosis not present

## 2018-10-27 DIAGNOSIS — Z23 Encounter for immunization: Secondary | ICD-10-CM | POA: Diagnosis not present

## 2018-10-27 DIAGNOSIS — E118 Type 2 diabetes mellitus with unspecified complications: Secondary | ICD-10-CM | POA: Insufficient documentation

## 2018-10-27 DIAGNOSIS — I10 Essential (primary) hypertension: Secondary | ICD-10-CM | POA: Diagnosis not present

## 2018-10-27 DIAGNOSIS — E1159 Type 2 diabetes mellitus with other circulatory complications: Secondary | ICD-10-CM

## 2018-10-27 MED ORDER — ALBUTEROL SULFATE HFA 108 (90 BASE) MCG/ACT IN AERS: 1.0000 | INHALATION_SPRAY | Freq: Four times a day (QID) | RESPIRATORY_TRACT | 0 refills | Status: DC | PRN

## 2018-10-27 NOTE — Progress Notes (Signed)
Date:  10/27/2018   Name:  Gary Gutierrez   DOB:  08-27-1951   MRN:  [id number]   Chief Complaint: Hypertension; Diabetes; and Shortness of Breath (Would like inhaler refilled. Has not had it in a while. Wants to know if its okay to take 2.5 tablets of torsemide.  )  Diabetes  He presents for his follow-up diabetic visit. He has type 2 diabetes mellitus. His disease course has been improving. Pertinent negatives for hypoglycemia include no dizziness, headaches or tremors. Pertinent negatives for diabetes include no chest pain, no fatigue, no polydipsia and no polyuria. Current diabetic treatment includes insulin injections and oral agent (triple therapy) (glipizide, jardiance, kombiglyze). He monitors blood glucose at home 1-2 x per day. His home blood glucose trend is decreasing steadily. His breakfast blood glucose is taken between 6-7 am. His breakfast blood glucose range is generally 90-110 mg/dl.  Hypertension  Associated symptoms include shortness of breath. Pertinent negatives include no chest pain, headaches or palpitations.  COPD - having a bit more shortness of breath and is out of his albuterol.  He has no sputum, fever or chills.  Review of Systems  Constitutional: Negative for appetite change, chills, fatigue, fever and unexpected weight change.  Eyes: Negative for visual disturbance.  Respiratory: Positive for cough and shortness of breath. Negative for wheezing.   Cardiovascular: Negative for chest pain, palpitations and leg swelling.  Gastrointestinal: Negative for abdominal pain and blood in stool.  Endocrine: Negative for polydipsia and polyuria.  Genitourinary: Negative for dysuria and hematuria.  Skin: Negative for color change and rash.  Neurological: Negative for dizziness, tremors, numbness and headaches.  Psychiatric/Behavioral: Negative for dysphoric mood.    Patient Active Problem List   Diagnosis Date Noted  . Type 2 diabetes mellitus with vascular  disease (HCC) 10/27/2018  . Chronic obstructive pulmonary disease (HCC) 02/11/2018  . CHF (congestive heart failure) (HCC) 10/29/2017  . Type 2 diabetes mellitus with polyneuropathy (HCC) 07/01/2017  . DJD (degenerative joint disease), lumbosacral 07/01/2017  . PAD (peripheral artery disease) (HCC) 06/30/2017  . Tobacco use disorder, mild, in sustained remission, abuse 04/10/2017  . Hx of colonic polyps 04/10/2016  . Hearing loss of both ears 01/03/2016  . Mixed hyperlipidemia 06/09/2015  . Essential hypertension 06/09/2015  . Multilevel degenerative disc disease 06/09/2015  . Coronary artery disease involving native coronary artery of native heart with angina pectoris (HCC) 05/09/2014  . Coronary artery disease involving autologous vein bypass graft 05/09/2014    Allergies  Allergen Reactions  . Entresto [Sacubitril-Valsartan] Other (See Comments)    Low blood pressure  . Penicillins Other (See Comments)    Other reaction(s): Unknown, pt does not know reactions    Past Surgical History:  Procedure Laterality Date  . APPENDECTOMY    . colonscopy  2010   benign polyps  . CORONARY ARTERY BYPASS GRAFT  1995  . CORONARY STENT INTERVENTION N/A 02/03/2017   Procedure: Coronary Stent Intervention;  Surgeon: Alwyn Pea, MD;  Location: [facility];  Service: Cardiovascular;  Laterality: N/A;  . ESOPHAGOGASTRODUODENOSCOPY  2010   normal  . LEFT HEART CATH AND CORONARY ANGIOGRAPHY N/A 02/03/2017   Procedure: Left Heart Cath and Coronary Angiography;  Surgeon: Alwyn Pea, MD;  Location: [facility] CV [facility];  Service: Cardiovascular;  Laterality: N/A;  . PERCUTANEOUS CORONARY STENT INTERVENTION (PCI-S)  2010, 2015   4 stents total    Social History   Tobacco Use  . Smoking status: Former  Smoker    Packs/day: 1.50    Years: 29.00    Pack years: 43.50    Types: Cigarettes    Last attempt to quit: 12/30/1994    Years since quitting: 23.8  . Smokeless tobacco: Never  Used  . Tobacco comment: smoking cessation materials not required  Substance Use Topics  . Alcohol use: No    Alcohol/week: 0.0 standard drinks  . Drug use: No     Medication list has been reviewed and updated.  Current Meds  Medication Sig  . ACCU-CHEK AVIVA PLUS test strip TEST BLOOD SUGAR EVERY DAY AS DIRECTED  . allopurinol (ZYLOPRIM) 300 MG tablet TAKE 1 TABLET BY MOUTH EVERY DAY  . aspirin EC 81 MG tablet Take 81 mg by mouth daily. Reported on 04/10/2016  . atorvastatin (LIPITOR) 80 MG tablet Take 1 tablet (80 mg total) by mouth at bedtime.  . carvedilol (COREG) 12.5 MG tablet Take 1 tablet (12.5 mg total) by mouth 2 (two) times daily with a meal.  . clopidogrel (PLAVIX) 75 MG tablet TAKE 1 TABLET(75 MG) BY MOUTH DAILY  . glipiZIDE (GLUCOTROL XL) 10 MG 24 hr tablet TAKE 1 TABLET BY MOUTH EVERY DAY  . Insulin Glargine (LANTUS SOLOSTAR) 100 UNIT/ML Solostar Pen Inject 10-50 Units into the skin daily at 10 pm.  . Insulin Pen Needle (PEN NEEDLES 31GX5/16") 31G X 8 MM MISC 1 each by Does not apply route daily.  . isosorbide mononitrate (IMDUR) 30 MG 24 hr tablet Take 1 tablet (30 mg total) by mouth daily.  Marland Kitchen JARDIANCE 25 MG TABS tablet TAKE 1 TABLET BY MOUTH DAILY  . KOMBIGLYZE XR 2.04-999 MG TB24 TAKE 2 TABLETS BY MOUTH DAILY  . nitroGLYCERIN (NITROSTAT) 0.4 MG SL tablet Place 1 tablet (0.4 mg total) under the tongue every 5 (five) minutes as needed for chest pain. (Patient taking differently: Place 0.4 mg under the tongue every 5 (five) minutes as needed for chest pain. )  . pantoprazole (PROTONIX) 40 MG tablet TAKE 1 TABLET(40 MG) BY MOUTH TWICE DAILY  . potassium chloride SA (K-DUR,KLOR-CON) 20 MEQ tablet Take 2 tablets (40 mEq total) by mouth daily.  Marland Kitchen torsemide (DEMADEX) 20 MG tablet TAKE 2 TABLETS(40 MG) BY MOUTH TWICE DAILY    PHQ 2/9 Scores 07/22/2018 07/14/2018 04/06/2018 02/23/2018  PHQ - 2 Score 0 0 0 0  PHQ- 9 Score 0 - - -    Physical Exam  Constitutional: He is oriented  to person, place, and time. He appears well-developed. No distress.  HENT:  Head: Normocephalic and atraumatic.  Neck: Normal range of motion. Neck supple.  Cardiovascular: Normal rate and regular rhythm.  Pulmonary/Chest: Effort normal and breath sounds normal. No respiratory distress. He has no wheezes. He has no rales.  Musculoskeletal: Normal range of motion.       Right lower leg: He exhibits no edema.       Left lower leg: He exhibits no edema.  Neurological: He is alert and oriented to person, place, and time.  Skin: Skin is warm and dry. No rash noted.  Healing puncture wound on lateral left palm and on the right palm near the thumb  Psychiatric: He has a normal mood and affect. His behavior is normal. Thought content normal.  Nursing note and vitals reviewed.   BP 124/82 (BP Location: Right Arm, Patient Position: Sitting, Cuff Size: Normal)   Pulse 81   Ht 5\' 9"  (1.753 m)   Wt 187 lb (84.8 kg)   SpO2  100%   BMI 27.62 kg/m   Assessment and Plan: 1. Type 2 diabetes mellitus with vascular disease (HCC) Improved with the addition of insulin - Basic metabolic panel - Hemoglobin A1c  2. Essential hypertension controlled  3. Chronic obstructive pulmonary disease, unspecified COPD type (HCC) stable - albuterol (PROVENTIL HFA;VENTOLIN HFA) 108 (90 Base) MCG/ACT inhaler; Inhale 1-2 puffs into the lungs every 6 (six) hours as needed for wheezing or shortness of breath.  Dispense: 1 Inhaler; Refill: 0   Partially dictated using Animal nutritionist. Any errors are unintentional.  Bari Edward, MD [facility]  10/27/2018

## 2018-10-28 LAB — HEMOGLOBIN A1C
ESTIMATED AVERAGE GLUCOSE: 151 mg/dL
Hgb A1c MFr Bld: 6.9 % — ABNORMAL HIGH (ref 4.8–5.6)

## 2018-10-28 LAB — BASIC METABOLIC PANEL
BUN/Creatinine Ratio: 22 (ref 10–24)
BUN: 26 mg/dL (ref 8–27)
CALCIUM: 9.8 mg/dL (ref 8.6–10.2)
CO2: 25 mmol/L (ref 20–29)
CREATININE: 1.18 mg/dL (ref 0.76–1.27)
Chloride: 96 mmol/L (ref 96–106)
GFR calc Af Amer: 73 mL/min/{1.73_m2} (ref >59)
GFR calc non Af Amer: 63 mL/min/{1.73_m2} (ref >59)
Glucose: 171 mg/dL — ABNORMAL HIGH (ref 65–99)
Potassium: 4.2 mmol/L (ref 3.5–5.2)
Sodium: 140 mmol/L (ref 134–144)

## 2018-10-28 LAB — LIPID PANEL
CHOL/HDL RATIO: 5.2 ratio — AB (ref 0.0–5.0)
CHOLESTEROL TOTAL: 140 mg/dL (ref 100–199)
HDL: 27 mg/dL — ABNORMAL LOW (ref >39)
LDL Calculated: 65 mg/dL (ref 0–99)
TRIGLYCERIDES: 239 mg/dL — AB (ref 0–149)
VLDL CHOLESTEROL CAL: 48 mg/dL — AB (ref 5–40)

## 2018-10-31 ENCOUNTER — Other Ambulatory Visit: Payer: Self-pay | Admitting: Family

## 2018-11-03 ENCOUNTER — Other Ambulatory Visit: Payer: Self-pay | Admitting: Internal Medicine

## 2018-11-12 ENCOUNTER — Other Ambulatory Visit: Payer: Self-pay | Admitting: Internal Medicine

## 2018-11-23 IMAGING — CR DG CHEST 2V
1 series · 2 of 2 positions shown · non-contrast
Comparison: 10/10/2014

CLINICAL DATA: Chest pain

EXAM:
CHEST  2 VIEW

[Series 1: dg chest 2 view · 0.14mm/px · 2 of 2 slices shown]
[im 1/2]
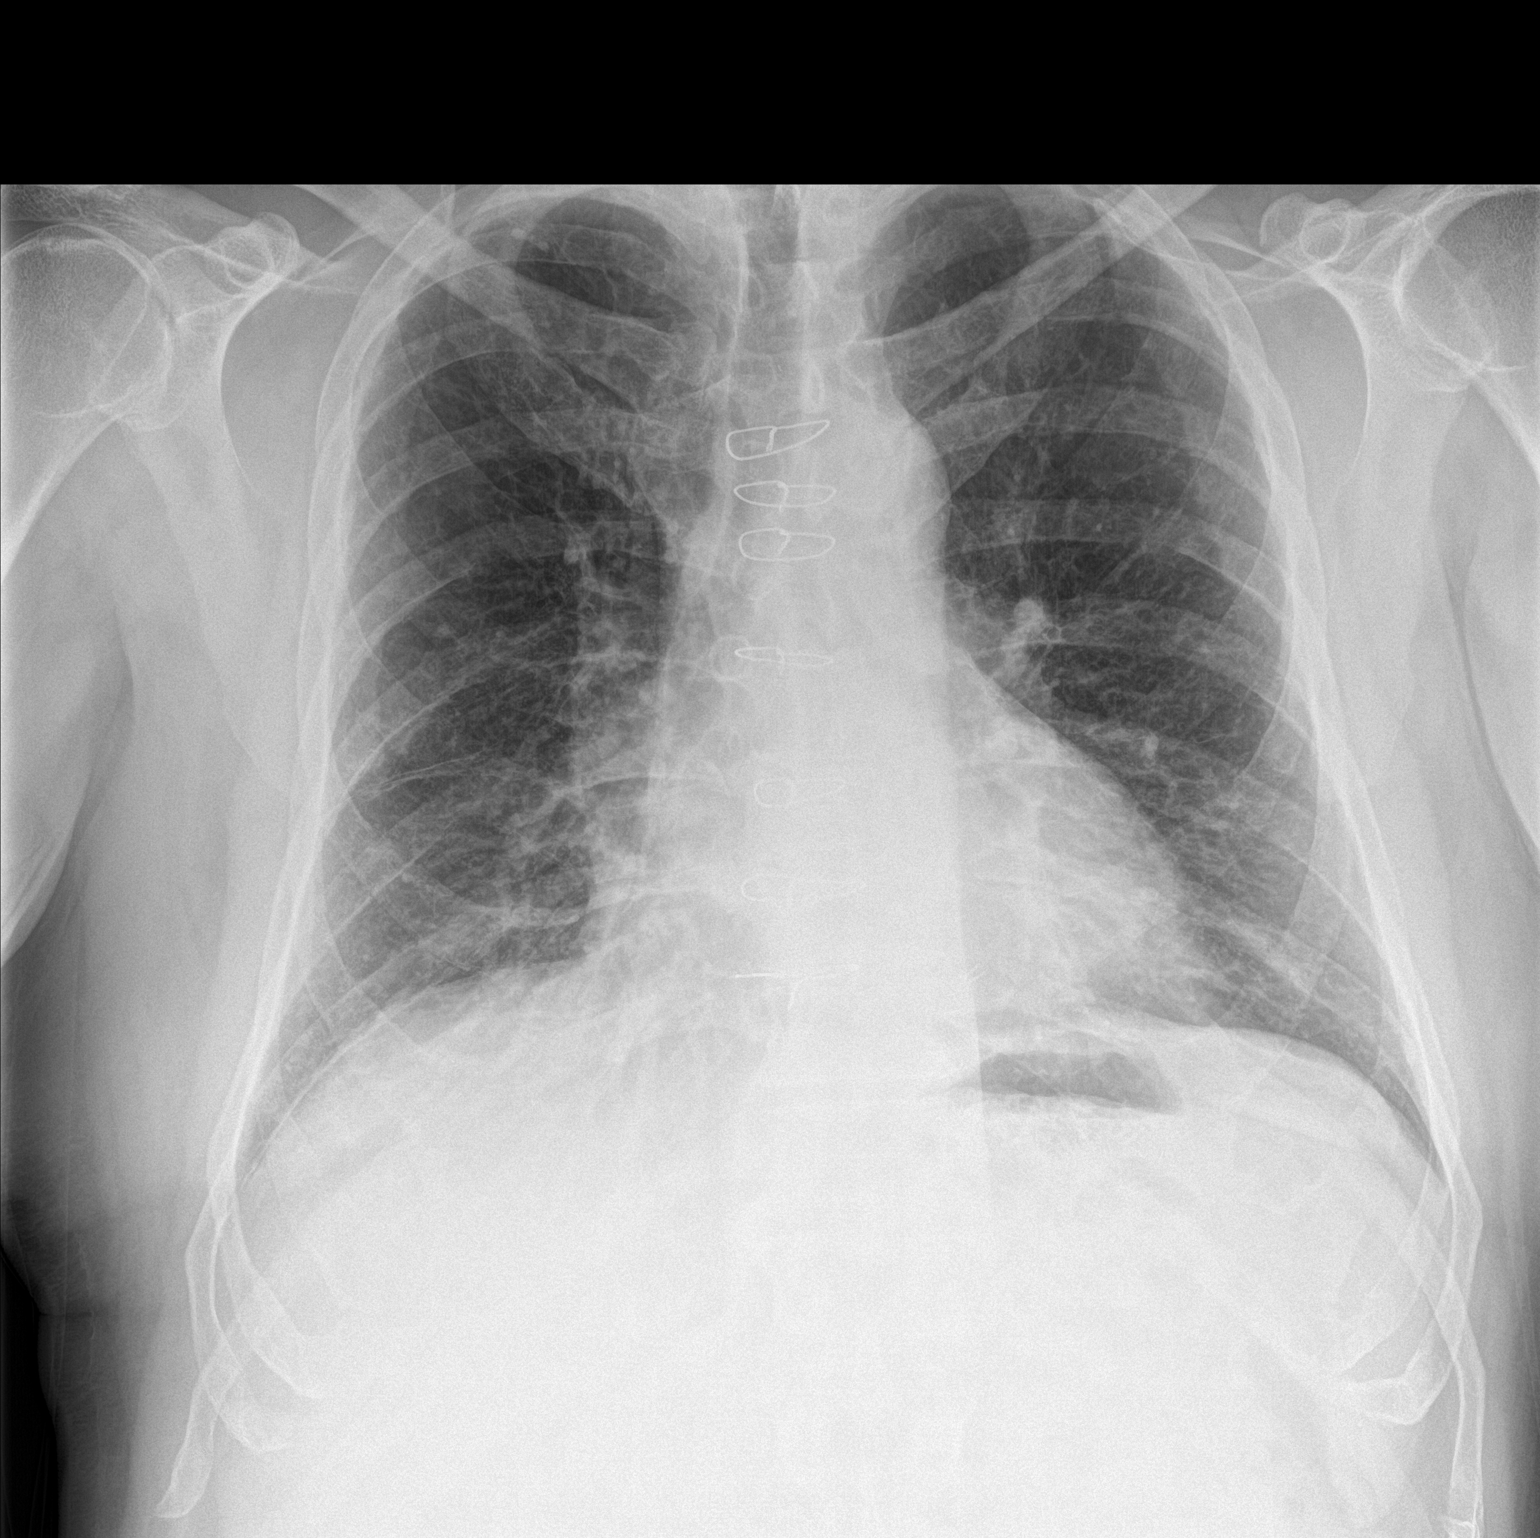
[im 2/2]
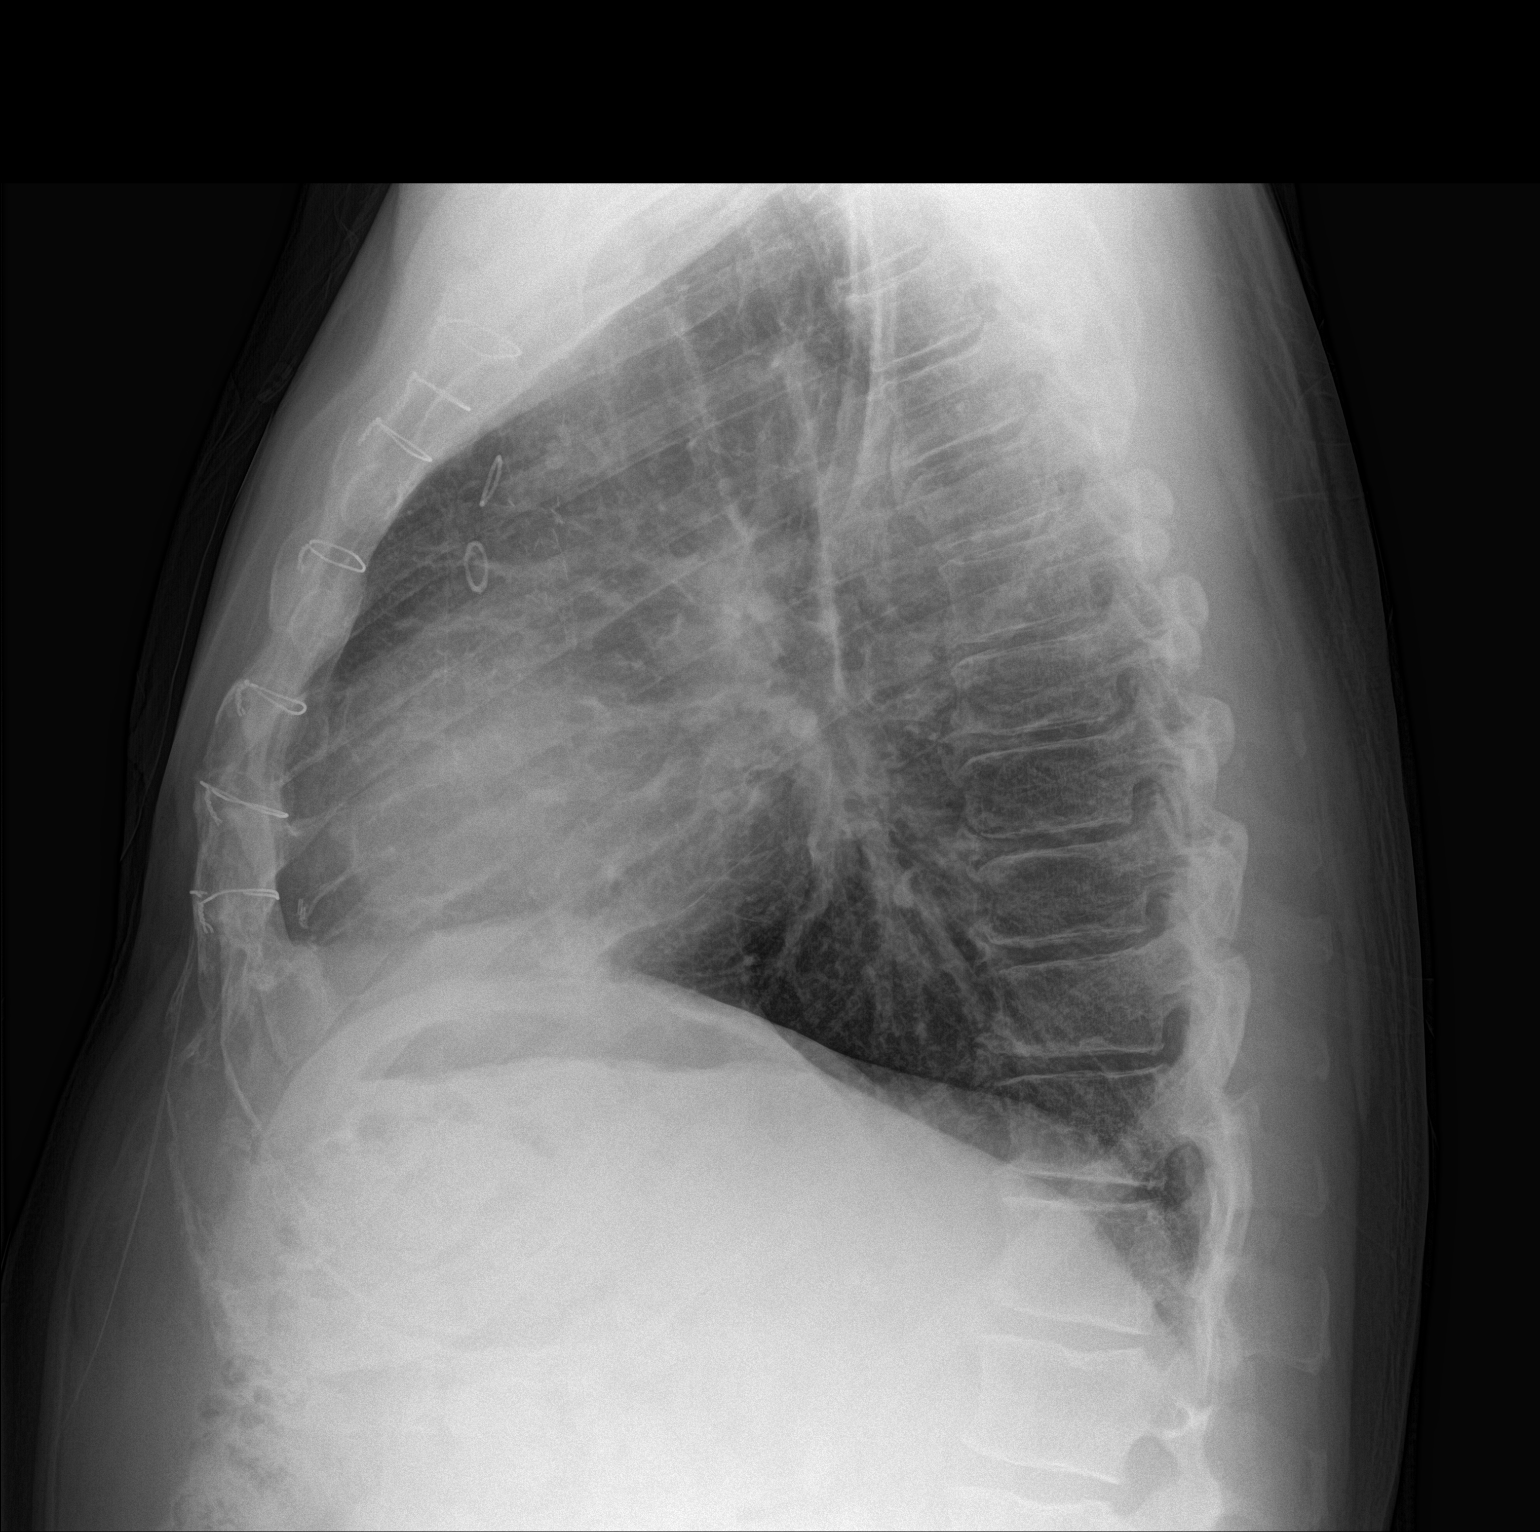

[2 of 2 positions shown; findings below may reference images not displayed]

FINDINGS: Cardiac shadow is stable. Postoperative changes are noted. The lungs
are well aerated bilaterally. Interstitial changes are seen
bilaterally without focal infiltrate. No sizable effusion is noted.
Degenerative change of the thoracic spine is noted.
IMPRESSION: Chronic changes without acute abnormality

## 2018-11-24 ENCOUNTER — Other Ambulatory Visit: Payer: Self-pay | Admitting: Internal Medicine

## 2018-11-24 DIAGNOSIS — J449 Chronic obstructive pulmonary disease, unspecified: Secondary | ICD-10-CM

## 2018-12-04 ENCOUNTER — Other Ambulatory Visit: Payer: Self-pay | Admitting: Internal Medicine

## 2018-12-04 DIAGNOSIS — E782 Mixed hyperlipidemia: Secondary | ICD-10-CM

## 2018-12-04 DIAGNOSIS — I5022 Chronic systolic (congestive) heart failure: Secondary | ICD-10-CM

## 2018-12-04 MED ORDER — ATORVASTATIN CALCIUM 80 MG PO TABS: 80.0000 mg | ORAL_TABLET | Freq: Every day | ORAL | 1 refills | Status: DC

## 2018-12-04 MED ORDER — CARVEDILOL 12.5 MG PO TABS: 12.5000 mg | ORAL_TABLET | Freq: Two times a day (BID) | ORAL | 1 refills | Status: DC

## 2018-12-16 DIAGNOSIS — I2510 Atherosclerotic heart disease of native coronary artery without angina pectoris: Secondary | ICD-10-CM | POA: Diagnosis not present

## 2018-12-16 DIAGNOSIS — I5022 Chronic systolic (congestive) heart failure: Secondary | ICD-10-CM | POA: Diagnosis not present

## 2018-12-16 DIAGNOSIS — Z955 Presence of coronary angioplasty implant and graft: Secondary | ICD-10-CM | POA: Diagnosis not present

## 2018-12-16 DIAGNOSIS — R0602 Shortness of breath: Secondary | ICD-10-CM | POA: Diagnosis not present

## 2018-12-16 DIAGNOSIS — I429 Cardiomyopathy, unspecified: Secondary | ICD-10-CM | POA: Diagnosis not present

## 2018-12-16 DIAGNOSIS — K219 Gastro-esophageal reflux disease without esophagitis: Secondary | ICD-10-CM | POA: Diagnosis not present

## 2018-12-16 DIAGNOSIS — E7849 Other hyperlipidemia: Secondary | ICD-10-CM | POA: Diagnosis not present

## 2018-12-16 DIAGNOSIS — I208 Other forms of angina pectoris: Secondary | ICD-10-CM | POA: Diagnosis not present

## 2018-12-16 DIAGNOSIS — I10 Essential (primary) hypertension: Secondary | ICD-10-CM | POA: Diagnosis not present

## 2018-12-16 DIAGNOSIS — Z951 Presence of aortocoronary bypass graft: Secondary | ICD-10-CM | POA: Diagnosis not present

## 2018-12-23 ENCOUNTER — Other Ambulatory Visit: Payer: Self-pay | Admitting: Internal Medicine

## 2018-12-23 DIAGNOSIS — J449 Chronic obstructive pulmonary disease, unspecified: Secondary | ICD-10-CM

## 2019-01-11 ENCOUNTER — Other Ambulatory Visit: Payer: Self-pay | Admitting: Internal Medicine

## 2019-01-11 NOTE — Progress Notes (Signed)
Patient ID: Gary Gutierrez, male    DOB: September 03, 1951, 68 y.o.   MRN: [id number]  HPI  Mr Ondo is a 68 y/o male with a history of CAD, DM, hyperlipidemia, HTN, remote tobacco use and chronic heart failure.   Echo report from 07/22/18 reviewed and showed an EF of 35% along with trivial AR/TR. Echo report from 10/29/17 reviewed and shows an EF of 20-25%. EF has declined from previous echo February 2018. Cardiac catheterization done on 02/03/17 showed multivessel disease. Successful PCI with DES from SVG to circumflex.    Has not been admitted or been in the ED in the last 6 months.   He presents today for a follow-up visit with a chief complaint of minimal shortness of breath upon moderate exertion. He describes this as chronic in nature having been present for several years. He has associated fatigue, cough, easy bruising and chronic pain along with this. He denies any difficulty sleeping, dizziness, abdominal distention, palpitations, pedal edema, chest pain or weight gain. Home weight has ranged from 181-182 pounds.   Past Medical History:  Diagnosis Date  . Allergy   . CHF (congestive heart failure) (HCC)   . Coronary artery disease   . Diabetes mellitus without complication (HCC)   . Hyperlipidemia   . Hypertension    Past Surgical History:  Procedure Laterality Date  . APPENDECTOMY    . colonscopy  2010   benign polyps  . CORONARY ARTERY BYPASS GRAFT  1995  . CORONARY STENT INTERVENTION N/A 02/03/2017   Procedure: Coronary Stent Intervention;  Surgeon: Alwyn Pea, MD;  Location: [facility];  Service: Cardiovascular;  Laterality: N/A;  . ESOPHAGOGASTRODUODENOSCOPY  2010   normal  . LEFT HEART CATH AND CORONARY ANGIOGRAPHY N/A 02/03/2017   Procedure: Left Heart Cath and Coronary Angiography;  Surgeon: Alwyn Pea, MD;  Location: [facility];  Service: Cardiovascular;  Laterality: N/A;  . PERCUTANEOUS CORONARY STENT INTERVENTION (PCI-S)  2010, 2015   4 stents total   Family History  Problem Relation Age of Onset  . Heart disease Father   . Lung cancer Brother   . Prostate cancer Brother   . Diabetes Mother   . Heart disease Mother   . Heart attack Mother   . Congestive Heart Failure Mother    Social History   Tobacco Use  . Smoking status: Former Smoker    Packs/day: 1.50    Years: 29.00    Pack years: 43.50    Types: Cigarettes    Last attempt to quit: 12/30/1994    Years since quitting: 24.0  . Smokeless tobacco: Never Used  . Tobacco comment: smoking cessation materials not required  Substance Use Topics  . Alcohol use: No    Alcohol/week: 0.0 standard drinks   Allergies  Allergen Reactions  . Entresto [Sacubitril-Valsartan] Other (See Comments)    Low blood pressure  . Penicillins Other (See Comments)    Other reaction(s): Unknown, pt does not know reactions   Prior to Admission medications   Medication Sig Start Date End Date Taking? Authorizing Provider  ACCU-CHEK AVIVA PLUS test strip TEST BLOOD SUGAR EVERY DAY AS DIRECTED 01/15/18  Yes [name], MD  allopurinol (ZYLOPRIM) 300 MG tablet TAKE 1 TABLET BY MOUTH EVERY DAY 01/11/19  Yes [name], MD  aspirin EC 81 MG tablet Take 81 mg by mouth daily. Reported on 04/10/2016   Yes [provider]  atorvastatin (LIPITOR) 80 MG tablet Take  1 tablet (80 mg total) by mouth at bedtime. 12/04/18  Yes [name], MD  carvedilol (COREG) 12.5 MG tablet Take 1 tablet (12.5 mg total) by mouth 2 (two) times daily with a meal. 12/04/18  Yes [name], MD  clopidogrel (PLAVIX) 75 MG tablet TAKE 1 TABLET(75 MG) BY MOUTH DAILY 10/18/18  Yes Clarisa Kindred A, FNP  glipiZIDE (GLUCOTROL XL) 10 MG 24 hr tablet TAKE 1 TABLET BY MOUTH EVERY DAY 10/18/18  Yes [name], MD  Insulin Glargine (LANTUS SOLOSTAR) 100 UNIT/ML Solostar Pen Inject 10-50 Units into the skin daily at 10 pm. 06/17/18  Yes [name], MD  Insulin Pen Needle (PEN NEEDLES  31GX5/16") 31G X 8 MM MISC 1 each by Does not apply route daily. 06/17/18  Yes [name], MD  isosorbide mononitrate (IMDUR) 30 MG 24 hr tablet Take 1 tablet (30 mg total) by mouth daily. 02/13/17 01/12/19 Yes Jennye Moccasin, MD  JARDIANCE 25 MG TABS tablet TAKE 1 TABLET BY MOUTH DAILY 10/18/18  Yes [name], MD  KOMBIGLYZE XR 2.04-999 MG TB24 TAKE 2 TABLETS BY MOUTH DAILY 09/15/18  Yes [name], MD  nitroGLYCERIN (NITROSTAT) 0.4 MG SL tablet Place 1 tablet (0.4 mg total) under the tongue every 5 (five) minutes as needed for chest pain. Patient taking differently: Place 0.4 mg under the tongue every 5 (five) minutes as needed for chest pain.  12/01/17  Yes Clarisa Kindred A, FNP  pantoprazole (PROTONIX) 40 MG tablet TAKE 1 TABLET(40 MG) BY MOUTH TWICE DAILY 11/12/18  Yes [name], MD  potassium chloride SA (K-DUR,KLOR-CON) 20 MEQ tablet TAKE 2 TABLETS(40 MEQ) BY MOUTH DAILY 11/01/18  Yes Clarisa Kindred A, FNP  torsemide (DEMADEX) 20 MG tablet TAKE 2 TABLETS(40 MG) BY MOUTH TWICE DAILY 09/10/18  Yes Clarisa Kindred A, FNP  VENTOLIN HFA 108 (90 Base) MCG/ACT inhaler INHALE 1 TO 2 PUFFS INTO THE LUNGS EVERY 6 HOURS AS NEEDED FOR WHEEZING OR SHORTNESS OF BREATH 12/24/18  Yes [name], MD    Review of Systems  Constitutional: Positive for fatigue (with moderate exertion). Negative for appetite change and fever.  HENT: Negative for congestion, postnasal drip and sore throat.   Eyes: Negative.   Respiratory: Positive for cough (productive cough) and shortness of breath (with moderate exertion). Negative for chest tightness.   Cardiovascular: Negative for chest pain, palpitations and leg swelling.  Gastrointestinal: Negative for abdominal distention and abdominal pain.  Endocrine: Negative.   Genitourinary: Negative.   Musculoskeletal: Positive for back pain and neck pain.  Skin: Negative.   Allergic/Immunologic: Negative.   Neurological: Negative for dizziness and  light-headedness.  Hematological: Negative for adenopathy. Bruises/bleeds easily.  Psychiatric/Behavioral: Negative for dysphoric mood and sleep disturbance. The patient is not nervous/anxious.    Vitals:   01/12/19 1146  BP: 115/75  Pulse: 87  Resp: 18  SpO2: 100%  Weight: 189 lb 2 oz (85.8 kg)  Height: 5\' 9"  (1.753 m)   Wt Readings from Last 3 Encounters:  01/12/19 189 lb 2 oz (85.8 kg)  10/27/18 187 lb (84.8 kg)  07/22/18 189 lb 12.8 oz (86.1 kg)   Lab Results  Component Value Date   CREATININE 1.18 10/27/2018   CREATININE 1.30 (H) 06/12/2018   CREATININE 1.24 02/11/2018    Physical Exam  Constitutional: He is oriented to person, place, and time. He appears well-developed and well-nourished.  HENT:  Head: Normocephalic and atraumatic.  Neck: Normal range of  motion. Neck supple. No JVD present.  Cardiovascular: Normal rate and regular rhythm.  Pulmonary/Chest: Effort normal. No respiratory distress. He has no wheezes. He has no rales.  Abdominal: Soft. He exhibits no distension. There is no abdominal tenderness.  Musculoskeletal:        General: No tenderness or edema.  Neurological: He is alert and oriented to person, place, and time.  Skin: Skin is warm and dry.  Psychiatric: He has a normal mood and affect. His behavior is normal. Thought content normal.  Nursing note and vitals reviewed.  Assessment & Plan:  1: Chronic heart failure with reduced ejection fraction- - NYHA class II - euvolemic today - weighing daily. Reminded to call for an overnight weight gain of >2 pounds or a weekly weight gain of >5 pounds - weight down 2 pounds from last visit 6 months ago - not adding salt and has been reading food labels. Reminded to closely follow a 2000mg  sodium diet  - saw cardiologist Juliann Pares([place]) 12/16/18  - patient was unable to tolerate entresto as it made his BP too low & made him feel "bad" so he stopped it and started feeling better - current BP will not tolerate  initiation of ACE-I/ ARB  2: HTN- - BP looks good today - says that his BP at home had been low due to entresto - saw PCP [name]([place]) 06/17/18 - BMP from 10/27/18 reviewed and showed sodium 140, potassium 4.2, creatinine 1.18 and GFR 63  3: Diabetes- - A1c on 10/27/18 was 6.9%  - glucose at home was 135  Patient did not bring his medications nor a list. Each medication was verbally reviewed with the patient and he was encouraged to bring the bottles to every visit to confirm accuracy of list.   Will not make a return appointment at this time. Advised patient that he could call back at anytime to make another appointment.

## 2019-01-12 ENCOUNTER — Encounter: Payer: Self-pay | Admitting: Family

## 2019-01-12 ENCOUNTER — Ambulatory Visit: Payer: Medicare Other | Attending: Family | Admitting: Family

## 2019-01-12 VITALS — BP 115/75 | HR 87 | Resp 18 | Ht 69.0 in | Wt 189.1 lb

## 2019-01-12 DIAGNOSIS — Z880 Allergy status to penicillin: Secondary | ICD-10-CM | POA: Diagnosis not present

## 2019-01-12 DIAGNOSIS — Z87891 Personal history of nicotine dependence: Secondary | ICD-10-CM | POA: Diagnosis not present

## 2019-01-12 DIAGNOSIS — E785 Hyperlipidemia, unspecified: Secondary | ICD-10-CM | POA: Diagnosis not present

## 2019-01-12 DIAGNOSIS — Z79899 Other long term (current) drug therapy: Secondary | ICD-10-CM | POA: Diagnosis not present

## 2019-01-12 DIAGNOSIS — I10 Essential (primary) hypertension: Secondary | ICD-10-CM

## 2019-01-12 DIAGNOSIS — Z955 Presence of coronary angioplasty implant and graft: Secondary | ICD-10-CM | POA: Insufficient documentation

## 2019-01-12 DIAGNOSIS — Z888 Allergy status to other drugs, medicaments and biological substances status: Secondary | ICD-10-CM | POA: Insufficient documentation

## 2019-01-12 DIAGNOSIS — I2510 Atherosclerotic heart disease of native coronary artery without angina pectoris: Secondary | ICD-10-CM | POA: Insufficient documentation

## 2019-01-12 DIAGNOSIS — Z794 Long term (current) use of insulin: Secondary | ICD-10-CM | POA: Insufficient documentation

## 2019-01-12 DIAGNOSIS — Z833 Family history of diabetes mellitus: Secondary | ICD-10-CM | POA: Insufficient documentation

## 2019-01-12 DIAGNOSIS — I5022 Chronic systolic (congestive) heart failure: Secondary | ICD-10-CM | POA: Diagnosis not present

## 2019-01-12 DIAGNOSIS — I110 Hypertensive heart disease with heart failure: Secondary | ICD-10-CM | POA: Insufficient documentation

## 2019-01-12 DIAGNOSIS — Z7982 Long term (current) use of aspirin: Secondary | ICD-10-CM | POA: Insufficient documentation

## 2019-01-12 DIAGNOSIS — E119 Type 2 diabetes mellitus without complications: Secondary | ICD-10-CM | POA: Insufficient documentation

## 2019-01-12 DIAGNOSIS — Z951 Presence of aortocoronary bypass graft: Secondary | ICD-10-CM | POA: Diagnosis not present

## 2019-01-12 DIAGNOSIS — E1142 Type 2 diabetes mellitus with diabetic polyneuropathy: Secondary | ICD-10-CM

## 2019-01-12 DIAGNOSIS — Z8249 Family history of ischemic heart disease and other diseases of the circulatory system: Secondary | ICD-10-CM | POA: Insufficient documentation

## 2019-01-12 NOTE — Patient Instructions (Signed)
Continue weighing daily and call for an overnight weight gain of > 2 pounds or a weekly weight gain of >5 pounds. 

## 2019-01-14 ENCOUNTER — Other Ambulatory Visit: Payer: Self-pay | Admitting: Internal Medicine

## 2019-01-14 DIAGNOSIS — J449 Chronic obstructive pulmonary disease, unspecified: Secondary | ICD-10-CM

## 2019-01-30 ENCOUNTER — Other Ambulatory Visit: Payer: Self-pay | Admitting: Internal Medicine

## 2019-02-28 ENCOUNTER — Other Ambulatory Visit: Payer: Self-pay | Admitting: Family

## 2019-03-01 ENCOUNTER — Ambulatory Visit: Payer: Medicare Other | Admitting: Internal Medicine

## 2019-03-23 ENCOUNTER — Ambulatory Visit: Payer: Medicare Other | Admitting: Internal Medicine

## 2019-04-02 ENCOUNTER — Other Ambulatory Visit: Payer: Self-pay | Admitting: Family

## 2019-04-13 ENCOUNTER — Other Ambulatory Visit: Payer: Self-pay

## 2019-04-13 ENCOUNTER — Telehealth: Payer: Self-pay | Admitting: Internal Medicine

## 2019-04-13 DIAGNOSIS — M539 Dorsopathy, unspecified: Secondary | ICD-10-CM

## 2019-04-13 MED ORDER — IBUPROFEN 800 MG PO TABS: 800.0000 mg | ORAL_TABLET | Freq: Three times a day (TID) | ORAL | 0 refills | Status: DC | PRN

## 2019-04-13 NOTE — Telephone Encounter (Signed)
Pt called requesting refill, states he is having pains and would like to start taking medication again. He is scheduled to come in on 4/23 for a follow up.  ibuprofen (ADVIL,MOTRIN) 800 MG tablet [[id number]] DISCONTINUED

## 2019-04-13 NOTE — Telephone Encounter (Signed)
Sent medication in for 30 days. He will need to discuss more at his visit with Dr B. Thanks.

## 2019-04-22 ENCOUNTER — Encounter: Payer: Self-pay | Admitting: Internal Medicine

## 2019-04-22 ENCOUNTER — Ambulatory Visit (INDEPENDENT_AMBULATORY_CARE_PROVIDER_SITE_OTHER): Payer: Medicare Other | Admitting: Internal Medicine

## 2019-04-22 ENCOUNTER — Other Ambulatory Visit: Payer: Self-pay

## 2019-04-22 ENCOUNTER — Other Ambulatory Visit: Payer: Self-pay | Admitting: Internal Medicine

## 2019-04-22 VITALS — BP 128/64 | HR 74 | Ht 69.0 in | Wt 186.4 lb

## 2019-04-22 DIAGNOSIS — E1169 Type 2 diabetes mellitus with other specified complication: Secondary | ICD-10-CM | POA: Diagnosis not present

## 2019-04-22 DIAGNOSIS — I10 Essential (primary) hypertension: Secondary | ICD-10-CM | POA: Diagnosis not present

## 2019-04-22 DIAGNOSIS — I25119 Atherosclerotic heart disease of native coronary artery with unspecified angina pectoris: Secondary | ICD-10-CM | POA: Diagnosis not present

## 2019-04-22 DIAGNOSIS — E785 Hyperlipidemia, unspecified: Secondary | ICD-10-CM | POA: Diagnosis not present

## 2019-04-22 DIAGNOSIS — E118 Type 2 diabetes mellitus with unspecified complications: Secondary | ICD-10-CM | POA: Diagnosis not present

## 2019-04-22 DIAGNOSIS — J449 Chronic obstructive pulmonary disease, unspecified: Secondary | ICD-10-CM

## 2019-04-22 MED ORDER — UMECLIDINIUM-VILANTEROL 62.5-25 MCG/INH IN AEPB: 1.0000 | INHALATION_SPRAY | Freq: Every day | RESPIRATORY_TRACT | 5 refills | Status: DC

## 2019-04-22 NOTE — Progress Notes (Signed)
Date:  04/22/2019   Name:  Gary Gutierrez   DOB:  Apr 01, 1951   MRN:  [id number]   Chief Complaint: Diabetes (Foot exam and MICRO) and Hypertension  Diabetes  He presents for his follow-up diabetic visit. He has type 2 diabetes mellitus. His disease course has been stable. There are no hypoglycemic associated symptoms. Pertinent negatives for hypoglycemia include no dizziness, headaches or tremors. Pertinent negatives for diabetes include no chest pain, no fatigue, no polydipsia and no polyuria. Current diabetic treatment includes insulin injections (jardiance, glipizide, kombiglyze). He is compliant with treatment most of the time. His weight is stable. He is following a generally healthy diet. There is no change in his home blood glucose trend. His breakfast blood glucose is taken between 7-8 am. His breakfast blood glucose range is generally 90-110 mg/dl. An ACE inhibitor/angiotensin II receptor blocker is not being taken. He does not see a podiatrist. Hypertension  This is a chronic problem. The problem is controlled. Pertinent negatives include no chest pain, headaches, palpitations or shortness of breath. Past treatments include beta blockers. Hypertensive end-organ damage includes CAD/MI.  Hyperlipidemia  This is a chronic problem. The problem is controlled. Pertinent negatives include no chest pain or shortness of breath. Current antihyperlipidemic treatment includes statins. The current treatment provides significant improvement of lipids.  CAD - he is followed by Cardiology, Dr. Juliann Pares.  Last visit 12/2018 - no changes made to medications. COPD - he has mild COPD and has some mild phlegm production.  His breathing is good and he is able to do most of what he wants.  He can climb stairs but has some mild SOB.  He uses albuterol mdi as needed Lab Results  Component Value Date   HGBA1C 6.9 (H) 10/27/2018   Lab Results  Component Value Date   CREATININE 1.18 10/27/2018   BUN 26  10/27/2018   NA 140 10/27/2018   K 4.2 10/27/2018   CL 96 10/27/2018   CO2 25 10/27/2018   Lab Results  Component Value Date   CHOL 140 10/27/2018   HDL 27 (L) 10/27/2018   LDLCALC 65 10/27/2018   TRIG 239 (H) 10/27/2018   CHOLHDL 5.2 (H) 10/27/2018     Review of Systems  Constitutional: Negative for appetite change, fatigue and unexpected weight change.  Eyes: Negative for visual disturbance.  Respiratory: Negative for cough, shortness of breath and wheezing.   Cardiovascular: Negative for chest pain, palpitations and leg swelling.  Gastrointestinal: Negative for abdominal pain and blood in stool.  Endocrine: Negative for polydipsia and polyuria.  Genitourinary: Negative for dysuria and hematuria.  Skin: Negative for color change and rash.  Neurological: Negative for dizziness, tremors, numbness and headaches.  Psychiatric/Behavioral: Negative for dysphoric mood and sleep disturbance.    Patient Active Problem List   Diagnosis Date Noted  . Type II diabetes mellitus with complication (HCC) 10/27/2018  . Chronic obstructive pulmonary disease (HCC) 02/11/2018  . CHF (congestive heart failure) (HCC) 10/29/2017  . Polyneuropathy due to type 2 diabetes mellitus (HCC) 07/01/2017  . DJD (degenerative joint disease), lumbosacral 07/01/2017  . PAD (peripheral artery disease) (HCC) 06/30/2017  . Tobacco use disorder, mild, in sustained remission, abuse 04/10/2017  . Hx of colonic polyps 04/10/2016  . Hearing loss of both ears 01/03/2016  . Hyperlipidemia associated with type 2 diabetes mellitus (HCC) 06/09/2015  . Essential hypertension 06/09/2015  . Multilevel degenerative disc disease 06/09/2015  . Coronary artery disease involving native coronary artery of native heart  with angina pectoris (HCC) 05/09/2014    Allergies  Allergen Reactions  . Entresto [Sacubitril-Valsartan] Other (See Comments)    Low blood pressure  . Penicillins Other (See Comments)    Other reaction(s):  Unknown, pt does not know reactions    Past Surgical History:  Procedure Laterality Date  . APPENDECTOMY    . colonscopy  2010   benign polyps  . CORONARY ARTERY BYPASS GRAFT  1995  . CORONARY STENT INTERVENTION N/A 02/03/2017   Procedure: Coronary Stent Intervention;  Surgeon: Alwyn Pea, MD;  Location: [facility] INVASIVE CV LAB;  Service: Cardiovascular;  Laterality: N/A;  . ESOPHAGOGASTRODUODENOSCOPY  2010   normal  . LEFT HEART CATH AND CORONARY ANGIOGRAPHY N/A 02/03/2017   Procedure: Left Heart Cath and Coronary Angiography;  Surgeon: Alwyn Pea, MD;  Location: [facility] INVASIVE CV LAB;  Service: Cardiovascular;  Laterality: N/A;  . PERCUTANEOUS CORONARY STENT INTERVENTION (PCI-S)  2010, 2015   4 stents total    Social History   Tobacco Use  . Smoking status: Former Smoker    Packs/day: 1.50    Years: 29.00    Pack years: 43.50    Types: Cigarettes    Last attempt to quit: 12/30/1994    Years since quitting: 24.3  . Smokeless tobacco: Never Used  . Tobacco comment: smoking cessation materials not required  Substance Use Topics  . Alcohol use: No    Alcohol/week: 0.0 standard drinks  . Drug use: No     Medication list has been reviewed and updated.  Current Meds  Medication Sig  . ACCU-CHEK AVIVA PLUS test strip TEST BLOOD SUGAR EVERY DAY AS DIRECTED  . allopurinol (ZYLOPRIM) 300 MG tablet TAKE 1 TABLET BY MOUTH EVERY DAY  . aspirin EC 81 MG tablet Take 81 mg by mouth daily. Reported on 04/10/2016  . atorvastatin (LIPITOR) 80 MG tablet Take 1 tablet (80 mg total) by mouth at bedtime.  . carvedilol (COREG) 12.5 MG tablet Take 1 tablet (12.5 mg total) by mouth 2 (two) times daily with a meal.  . clopidogrel (PLAVIX) 75 MG tablet TAKE 1 TABLET(75 MG) BY MOUTH DAILY  . glipiZIDE (GLUCOTROL XL) 10 MG 24 hr tablet TAKE 1 TABLET BY MOUTH EVERY DAY  . ibuprofen (ADVIL,MOTRIN) 800 MG tablet Take 1 tablet (800 mg total) by mouth every 8 (eight) hours as needed.  . Insulin  Glargine (LANTUS SOLOSTAR) 100 UNIT/ML Solostar Pen Inject 10-50 Units into the skin daily at 10 pm.  . Insulin Pen Needle (PEN NEEDLES 31GX5/16") 31G X 8 MM MISC 1 each by Does not apply route daily.  . isosorbide mononitrate (IMDUR) 30 MG 24 hr tablet Take 1 tablet (30 mg total) by mouth daily.  Marland Kitchen JARDIANCE 25 MG TABS tablet TAKE 1 TABLET BY MOUTH DAILY  . KOMBIGLYZE XR 2.04-999 MG TB24 TAKE 2 TABLETS BY MOUTH DAILY  . nitroGLYCERIN (NITROSTAT) 0.4 MG SL tablet Place 1 tablet (0.4 mg total) under the tongue every 5 (five) minutes as needed for chest pain. (Patient taking differently: Place 0.4 mg under the tongue every 5 (five) minutes as needed for chest pain. )  . pantoprazole (PROTONIX) 40 MG tablet TAKE 1 TABLET(40 MG) BY MOUTH TWICE DAILY  . potassium chloride SA (K-DUR,KLOR-CON) 20 MEQ tablet TAKE 2 TABLETS(40 MEQ) BY MOUTH DAILY  . torsemide (DEMADEX) 20 MG tablet TAKE 2 TABLETS(40 MG) BY MOUTH TWICE DAILY    PHQ 2/9 Scores 04/22/2019 07/22/2018 07/14/2018 04/06/2018  PHQ - 2 Score 0 0  0 0  PHQ- 9 Score - 0 - -    BP Readings from Last 3 Encounters:  04/22/19 128/64  01/12/19 115/75  10/27/18 124/82    Physical Exam Vitals signs and nursing note reviewed.  Constitutional:      General: He is not in acute distress.    Appearance: He is well-developed.  HENT:     Head: Normocephalic and atraumatic.  Eyes:     Pupils: Pupils are equal, round, and reactive to light.  Neck:     Musculoskeletal: Normal range of motion and neck supple.  Cardiovascular:     Rate and Rhythm: Normal rate and regular rhythm.     Pulses:          Dorsalis pedis pulses are 1+ on the right side and 1+ on the left side.       Posterior tibial pulses are 1+ on the right side and 1+ on the left side.  Pulmonary:     Effort: Pulmonary effort is normal. No respiratory distress.     Breath sounds: Rhonchi present. No wheezing.  Musculoskeletal: Normal range of motion.     Right lower leg: No edema.      Left lower leg: No edema.  Lymphadenopathy:     Cervical: No cervical adenopathy.  Skin:    General: Skin is warm and dry.     Findings: No rash.  Neurological:     Mental Status: He is alert and oriented to person, place, and time.  Psychiatric:        Behavior: Behavior normal.        Thought Content: Thought content normal.    Foot exam - normal skin, nails, pulses and sensation bilaterally  Wt Readings from Last 3 Encounters:  04/22/19 186 lb 6.4 oz (84.6 kg)  01/12/19 189 lb 2 oz (85.8 kg)  10/27/18 187 lb (84.8 kg)    BP 128/64   Pulse 74   Ht 5\' 9"  (1.753 m)   Wt 186 lb 6.4 oz (84.6 kg)   SpO2 97%   BMI 27.53 kg/m   Assessment and Plan: 1. Type II diabetes mellitus with complication (HCC) Check labs and advise - Hemoglobin A1c - Microalbumin / creatinine urine ratio  2. Essential hypertension controlled - Comprehensive metabolic panel  3. Coronary artery disease involving native coronary artery of native heart with angina pectoris (HCC) Stable, continue current therapy  4. Hyperlipidemia associated with type 2 diabetes mellitus (HCC) On statin therapy  5. Chronic obstructive pulmonary disease, unspecified COPD type (HCC) Will try Anora once daily; continue albuterol PRN - umeclidinium-vilanterol (ANORO ELLIPTA) 62.5-25 MCG/INH AEPB; Inhale 1 puff into the lungs daily.  Dispense: 1 each; Refill: 5   Partially dictated using Animal nutritionistDragon software. Any errors are unintentional.  Bari EdwardLaura Xela Oregel, MD [facility]  04/22/2019

## 2019-04-23 LAB — COMPREHENSIVE METABOLIC PANEL
ALT: 20 IU/L (ref 0–44)
AST: 19 IU/L (ref 0–40)
Albumin/Globulin Ratio: 1.5 (ref 1.2–2.2)
Albumin: 4.4 g/dL (ref 3.8–4.8)
Alkaline Phosphatase: 139 IU/L — ABNORMAL HIGH (ref 39–117)
BUN/Creatinine Ratio: 17 (ref 10–24)
BUN: 20 mg/dL (ref 8–27)
Bilirubin Total: 0.8 mg/dL (ref 0.0–1.2)
CO2: 23 mmol/L (ref 20–29)
Calcium: 9.6 mg/dL (ref 8.6–10.2)
Chloride: 99 mmol/L (ref 96–106)
Creatinine, Ser: 1.18 mg/dL (ref 0.76–1.27)
GFR calc Af Amer: 73 mL/min/{1.73_m2} (ref >59)
GFR calc non Af Amer: 63 mL/min/{1.73_m2} (ref >59)
Globulin, Total: 2.9 g/dL (ref 1.5–4.5)
Glucose: 115 mg/dL — ABNORMAL HIGH (ref 65–99)
Potassium: 4.3 mmol/L (ref 3.5–5.2)
Sodium: 141 mmol/L (ref 134–144)
Total Protein: 7.3 g/dL (ref 6.0–8.5)

## 2019-04-23 LAB — MICROALBUMIN / CREATININE URINE RATIO
Creatinine, Urine: 28.7 mg/dL
Microalb/Creat Ratio: <10 mg/g creat (ref 0–29)
Microalbumin, Urine: <3 ug/mL

## 2019-04-23 LAB — HEMOGLOBIN A1C
Est. average glucose Bld gHb Est-mCnc: 128 mg/dL
Hgb A1c MFr Bld: 6.1 % — ABNORMAL HIGH (ref 4.8–5.6)

## 2019-04-26 ENCOUNTER — Other Ambulatory Visit: Payer: Self-pay | Admitting: Internal Medicine

## 2019-04-26 DIAGNOSIS — E1165 Type 2 diabetes mellitus with hyperglycemia: Principal | ICD-10-CM

## 2019-04-26 DIAGNOSIS — IMO0001 Reserved for inherently not codable concepts without codable children: Secondary | ICD-10-CM

## 2019-05-11 ENCOUNTER — Other Ambulatory Visit: Payer: Self-pay | Admitting: Internal Medicine

## 2019-06-02 ENCOUNTER — Other Ambulatory Visit: Payer: Self-pay | Admitting: Internal Medicine

## 2019-06-02 DIAGNOSIS — I5022 Chronic systolic (congestive) heart failure: Secondary | ICD-10-CM

## 2019-06-05 ENCOUNTER — Emergency Department
Admission: EM | Admit: 2019-06-05 | Discharge: 2019-06-05 | Disposition: A | Discharge status: Home / Self Care | Payer: Medicare Other | Attending: Student in an Organized Health Care Education/Training Program | Admitting: Student in an Organized Health Care Education/Training Program

## 2019-06-05 ENCOUNTER — Other Ambulatory Visit: Payer: Self-pay

## 2019-06-05 ENCOUNTER — Emergency Department: Payer: Medicare Other

## 2019-06-05 DIAGNOSIS — E1151 Type 2 diabetes mellitus with diabetic peripheral angiopathy without gangrene: Secondary | ICD-10-CM | POA: Diagnosis not present

## 2019-06-05 DIAGNOSIS — Z7901 Long term (current) use of anticoagulants: Secondary | ICD-10-CM | POA: Insufficient documentation

## 2019-06-05 DIAGNOSIS — Z955 Presence of coronary angioplasty implant and graft: Secondary | ICD-10-CM | POA: Insufficient documentation

## 2019-06-05 DIAGNOSIS — R0602 Shortness of breath: Secondary | ICD-10-CM | POA: Diagnosis present

## 2019-06-05 DIAGNOSIS — R05 Cough: Secondary | ICD-10-CM | POA: Diagnosis not present

## 2019-06-05 DIAGNOSIS — Z7982 Long term (current) use of aspirin: Secondary | ICD-10-CM | POA: Insufficient documentation

## 2019-06-05 DIAGNOSIS — I110 Hypertensive heart disease with heart failure: Secondary | ICD-10-CM | POA: Insufficient documentation

## 2019-06-05 DIAGNOSIS — E1140 Type 2 diabetes mellitus with diabetic neuropathy, unspecified: Secondary | ICD-10-CM | POA: Diagnosis not present

## 2019-06-05 DIAGNOSIS — Z87891 Personal history of nicotine dependence: Secondary | ICD-10-CM | POA: Insufficient documentation

## 2019-06-05 DIAGNOSIS — Z79899 Other long term (current) drug therapy: Secondary | ICD-10-CM | POA: Insufficient documentation

## 2019-06-05 DIAGNOSIS — Z20828 Contact with and (suspected) exposure to other viral communicable diseases: Secondary | ICD-10-CM | POA: Diagnosis not present

## 2019-06-05 DIAGNOSIS — I25119 Atherosclerotic heart disease of native coronary artery with unspecified angina pectoris: Secondary | ICD-10-CM | POA: Diagnosis not present

## 2019-06-05 DIAGNOSIS — Z794 Long term (current) use of insulin: Secondary | ICD-10-CM | POA: Diagnosis not present

## 2019-06-05 DIAGNOSIS — I509 Heart failure, unspecified: Secondary | ICD-10-CM | POA: Diagnosis not present

## 2019-06-05 LAB — CBC WITH DIFFERENTIAL/PLATELET
Abs Immature Granulocytes: 0.03 10*3/uL (ref 0.00–0.07)
Basophils Absolute: 0 10*3/uL (ref 0.0–0.1)
Basophils Relative: 0 %
Eosinophils Absolute: 0.1 10*3/uL (ref 0.0–0.5)
Eosinophils Relative: 1 %
HCT: 45.7 % (ref 39.0–52.0)
Hemoglobin: 15.1 g/dL (ref 13.0–17.0)
Immature Granulocytes: 0 %
Lymphocytes Relative: 16 %
Lymphs Abs: 1.4 10*3/uL (ref 0.7–4.0)
MCH: 30.9 pg (ref 26.0–34.0)
MCHC: 33 g/dL (ref 30.0–36.0)
MCV: 93.6 fL (ref 80.0–100.0)
Monocytes Absolute: 0.7 10*3/uL (ref 0.1–1.0)
Monocytes Relative: 8 %
Neutro Abs: 6.8 10*3/uL (ref 1.7–7.7)
Neutrophils Relative %: 75 %
Platelets: 204 10*3/uL (ref 150–400)
RBC: 4.88 MIL/uL (ref 4.22–5.81)
RDW: 14.6 % (ref 11.5–15.5)
WBC: 9.1 10*3/uL (ref 4.0–10.5)
nRBC: 0 % (ref 0.0–0.2)

## 2019-06-05 LAB — COMPREHENSIVE METABOLIC PANEL
ALT: 20 U/L (ref 0–44)
AST: 25 U/L (ref 15–41)
Albumin: 3.9 g/dL (ref 3.5–5.0)
Alkaline Phosphatase: 130 U/L — ABNORMAL HIGH (ref 38–126)
Anion gap: 14 (ref 5–15)
BUN: 22 mg/dL (ref 8–23)
CO2: 22 mmol/L (ref 22–32)
Calcium: 8.8 mg/dL — ABNORMAL LOW (ref 8.9–10.3)
Chloride: 104 mmol/L (ref 98–111)
Creatinine, Ser: 1.43 mg/dL — ABNORMAL HIGH (ref 0.61–1.24)
GFR calc Af Amer: 58 mL/min — ABNORMAL LOW (ref >60)
GFR calc non Af Amer: 50 mL/min — ABNORMAL LOW (ref >60)
Glucose, Bld: 206 mg/dL — ABNORMAL HIGH (ref 70–99)
Potassium: 3.8 mmol/L (ref 3.5–5.1)
Sodium: 140 mmol/L (ref 135–145)
Total Bilirubin: 1.3 mg/dL — ABNORMAL HIGH (ref 0.3–1.2)
Total Protein: 7.7 g/dL (ref 6.5–8.1)

## 2019-06-05 LAB — BRAIN NATRIURETIC PEPTIDE: B Natriuretic Peptide: 350 pg/mL — ABNORMAL HIGH (ref 0.0–100.0)

## 2019-06-05 LAB — MAGNESIUM: Magnesium: 2.2 mg/dL (ref 1.7–2.4)

## 2019-06-05 LAB — SARS CORONAVIRUS 2 BY RT PCR (HOSPITAL ORDER, PERFORMED IN ~~LOC~~ HOSPITAL LAB): SARS Coronavirus 2: NEGATIVE

## 2019-06-05 LAB — TROPONIN I: Troponin I: <0.03 ng/mL (ref <0.03)

## 2019-06-05 MED ORDER — FUROSEMIDE 10 MG/ML IJ SOLN
40.0000 mg | Freq: Once | INTRAMUSCULAR | Status: AC
  Administered 2019-06-05: 40 mg via INTRAVENOUS
  Filled 2019-06-05: qty 4

## 2019-06-05 NOTE — ED Provider Notes (Signed)
[facility] Emergency Department Provider Note  ____________________________________________  Time seen: Approximately 1:20 PM  I have reviewed the triage vital signs and the nursing notes.   HISTORY  Chief Complaint Shortness of Breath    HPI Gary Gutierrez is a 68 y.o. male with a history of CHF, CAD, diabetes, hypertension and hyperlipidemia, presents to the emergency department with progressive shortness of breath worsened with exertion over the past 2 to 3 days.  Associated symptoms include orthopnea and paroxysmal nocturnal dyspnea.  Patient denies weight gain or ankle swelling.  No chest pain, chest tightness, fever or nasal congestion.  Patient states that he has an intermittent cough that is baseline for him that has not worsened since shortness of breath began. Patient states that he is trying to proactively seek care to prevent worsening.  Patient does admit to being fatigued.        Past Medical History:  Diagnosis Date  . Allergy   . CHF (congestive heart failure) ([place])   . Coronary artery disease   . Diabetes mellitus without complication ([place])   . Hyperlipidemia   . Hypertension     Patient Active Problem List   Diagnosis Date Noted  . Type II diabetes mellitus with complication ([place]) 78/29/5621  . Chronic obstructive pulmonary disease ([place]) 02/11/2018  . CHF (congestive heart failure) ([place]) 10/29/2017  . Polyneuropathy due to type 2 diabetes mellitus ([place]) 07/01/2017  . DJD (degenerative joint disease), lumbosacral 07/01/2017  . PAD (peripheral artery disease) ([place]) 06/30/2017  . Tobacco use disorder, mild, in sustained remission, abuse 04/10/2017  . Hx of colonic polyps 04/10/2016  . Hearing loss of both ears 01/03/2016  . Hyperlipidemia associated with type 2 diabetes mellitus ([place]) 06/09/2015  . Essential hypertension 06/09/2015  . Multilevel degenerative disc disease 06/09/2015  . Coronary artery disease involving native  coronary artery of native heart with angina pectoris ([place]) 05/09/2014    Past Surgical History:  Procedure Laterality Date  . APPENDECTOMY    . colonscopy  2010   benign polyps  . CORONARY ARTERY BYPASS GRAFT  1995  . CORONARY STENT INTERVENTION N/A 02/03/2017   Procedure: Coronary Stent Intervention;  Surgeon: Yolonda Kida, MD;  Location: [place] CV LAB;  Service: Cardiovascular;  Laterality: N/A;  . ESOPHAGOGASTRODUODENOSCOPY  2010   normal  . LEFT HEART CATH AND CORONARY ANGIOGRAPHY N/A 02/03/2017   Procedure: Left Heart Cath and Coronary Angiography;  Surgeon: Yolonda Kida, MD;  Location: [place] CV LAB;  Service: Cardiovascular;  Laterality: N/A;  . PERCUTANEOUS CORONARY STENT INTERVENTION (PCI-S)  2010, 2015   4 stents total    Prior to Admission medications   Medication Sig Start Date End Date Taking? Authorizing Provider  ACCU-CHEK AVIVA PLUS test strip TEST BLOOD SUGAR EVERY DAY AS DIRECTED 01/15/18   Glean Hess, MD  allopurinol (ZYLOPRIM) 300 MG tablet TAKE 1 TABLET BY MOUTH EVERY DAY 01/11/19   Glean Hess, MD  aspirin EC 81 MG tablet Take 81 mg by mouth daily. Reported on 04/10/2016    [provider]  atorvastatin (LIPITOR) 80 MG tablet Take 1 tablet (80 mg total) by mouth at bedtime. 12/04/18   Glean Hess, MD  carvedilol (COREG) 12.5 MG tablet TAKE 1 TABLET(12.5 MG) BY MOUTH TWICE DAILY WITH A MEAL 06/02/19   Glean Hess, MD  clopidogrel (PLAVIX) 75 MG tablet TAKE 1 TABLET(75 MG) BY MOUTH DAILY 01/30/19   Glean Hess, MD  glipiZIDE (GLUCOTROL  XL) 10 MG 24 hr tablet TAKE 1 TABLET(10 MG) BY MOUTH DAILY 05/11/19   Reubin MilanBerglund, [name], MD  ibuprofen (ADVIL,MOTRIN) 800 MG tablet Take 1 tablet (800 mg total) by mouth every 8 (eight) hours as needed. 04/13/19   Reubin MilanBerglund, [name], MD  Insulin Glargine (LANTUS SOLOSTAR) 100 UNIT/ML Solostar Pen Inject 10-50 Units into the skin daily at 10 pm. 06/17/18   Reubin MilanBerglund, [name], MD  Insulin  Pen Needle (PEN NEEDLES 31GX5/16") 31G X 8 MM MISC 1 each by Does not apply route daily. 06/17/18   Reubin MilanBerglund, [name], MD  isosorbide mononitrate (IMDUR) 30 MG 24 hr tablet Take 1 tablet (30 mg total) by mouth daily. 02/13/17 04/22/19  [name], [name], MD  JARDIANCE 25 MG TABS tablet TAKE 1 TABLET BY MOUTH DAILY 10/18/18   Reubin MilanBerglund, [name], MD  KOMBIGLYZE XR 2.04-999 MG TB24 TAKE 2 TABLETS BY MOUTH DAILY 04/26/19   Reubin MilanBerglund, [name], MD  nitroGLYCERIN (NITROSTAT) 0.4 MG SL tablet Place 1 tablet (0.4 mg total) under the tongue every 5 (five) minutes as needed for chest pain. Patient taking differently: Place 0.4 mg under the tongue every 5 (five) minutes as needed for chest pain.  12/01/17   Delma FreezeHackney, Tina A, FNP  pantoprazole (PROTONIX) 40 MG tablet TAKE 1 TABLET(40 MG) BY MOUTH TWICE DAILY 11/12/18   Reubin MilanBerglund, [name], MD  potassium chloride SA (K-DUR,KLOR-CON) 20 MEQ tablet TAKE 2 TABLETS(40 MEQ) BY MOUTH DAILY 11/01/18   Clarisa KindredHackney, Tina A, FNP  torsemide (DEMADEX) 20 MG tablet TAKE 2 TABLETS(40 MG) BY MOUTH TWICE DAILY 04/02/19   Clarisa KindredHackney, Tina A, FNP  umeclidinium-vilanterol (ANORO ELLIPTA) 62.5-25 MCG/INH AEPB Inhale 1 puff into the lungs daily. 04/22/19   Reubin MilanBerglund, [name], MD  VENTOLIN HFA 108 [phone](90 Base) MCG/ACT inhaler INHALE 1 TO 2 PUFFS INTO THE LUNGS EVERY 6 HOURS AS NEEDED FOR WHEEZING OR SHORTNESS OF BREATH Patient not taking: Reported on 04/22/2019 01/15/19   Reubin MilanBerglund, [name], MD    Allergies Sherryll BurgerEntresto [sacubitril-valsartan] and Penicillins  Family History  Problem Relation Age of Onset  . Heart disease Father   . Lung cancer Brother   . Prostate cancer Brother   . Diabetes Mother   . Heart disease Mother   . Heart attack Mother   . Congestive Heart Failure Mother     Social History Social History   Tobacco Use  . Smoking status: Former Smoker    Packs/day: 1.50    Years: 29.00    Pack years: 43.50    Types: Cigarettes    Last attempt to quit: 12/30/1994    Years since quitting:  24.4  . Smokeless tobacco: Never Used  . Tobacco comment: smoking cessation materials not required  Substance Use Topics  . Alcohol use: No    Alcohol/week: 0.0 standard drinks  . Drug use: No     Review of Systems  Constitutional: No fever/chills Eyes: No visual changes. No discharge ENT: No upper respiratory complaints. Cardiovascular: no chest pain. Respiratory: Patient has intermittent cough and SOB.  Gastrointestinal: No abdominal pain.  No nausea, no vomiting.  No diarrhea.  No constipation. Musculoskeletal: Negative for musculoskeletal pain. Skin: Negative for rash, abrasions, lacerations, ecchymosis. Neurological: Negative for headaches, focal weakness or numbness. ____________________________________________   PHYSICAL EXAM:  VITAL SIGNS: ED Triage Vitals  Enc Vitals Group     BP 06/05/19 1242 127/78     Pulse Rate 06/05/19 1242 62     Resp 06/05/19 1242 (!) 24  Temp 06/05/19 1244 97.6 F (36.4 C)     Temp Source 06/05/19 1242 Oral     SpO2 06/05/19 1242 98 %     Weight 06/05/19 1243 182 lb (82.6 kg)     Height 06/05/19 1243 5\' 9"  (1.753 m)     Head Circumference --      Peak Flow --      Pain Score 06/05/19 1243 0     Pain Loc --      Pain Edu? --      Excl. in GC? --      Constitutional: Alert and oriented.  Patient is able to speak in complete sentences.  He does become short of breath and needs to take a break from speaking during history. Eyes: Conjunctivae are normal. PERRL. EOMI. Head: Atraumatic. ENT:      Nose: No congestion/rhinnorhea.      Mouth/Throat: Mucous membranes are moist.  Cardiovascular: Normal rate, regular rhythm. Normal S1 and S2.  Good peripheral circulation. Respiratory: Normal respiratory effort . Patient is tachypneic. No retractions. Lung sounds diminished in the bases bilaterally. Good air entry to the bases with no decreased or absent breath sounds. Musculoskeletal: Full range of motion to all extremities. No gross  deformities appreciated. Neurologic:  Normal speech and language. No gross focal neurologic deficits are appreciated.  Skin:  Skin is warm, dry and intact. No rash noted. Psychiatric: Mood and affect are normal. Speech and behavior are normal. Patient exhibits appropriate insight and judgement.   ____________________________________________   LABS (all labs ordered are listed, but only abnormal results are displayed)  Labs Reviewed  SARS CORONAVIRUS 2 (HOSPITAL ORDER, PERFORMED IN [facility])  CBC WITH DIFFERENTIAL/PLATELET  COMPREHENSIVE METABOLIC PANEL  TROPONIN I   ____________________________________________  EKG  Ventricular rate 85 bpm.  PVCs visualized.  Sinus rhythm with only no ST segment elevation.  ____________________________________________  RADIOLOGY I personally viewed and evaluated these images as part of my medical decision making, as well as reviewing the written report by the radiologist.    No results found.  ____________________________________________    PROCEDURES  Procedure(s) performed:    Procedures    Medications - No data to display   ____________________________________________   INITIAL IMPRESSION / ASSESSMENT AND PLAN / ED COURSE  Pertinent labs & imaging results that were available during my care of the patient were reviewed by me and considered in my medical decision making (see chart for details).  Review of the [place] CSRS was performed in accordance of the [facility] prior to dispensing any controlled drugs.  Clinical Course as of Jun 04 1518  Sat Jun 05, 2019  1449 B Natriuretic Peptide(!): 350.0 [JW]  1449 Comprehensive metabolic panel(!) [JW]  1449 B Natriuretic Peptide(!): 350.0 [JW]    Clinical Course User Index [JW] Orvil FeilWoods, Hodges Treiber M, PA-C        ----------------------------------------- 3:09 PM on 06/05/2019 -----------------------------------------  Spoke with Dr.Kowalski who [facility]  Very much appreciate appreciate consult.  I reviewed patient's history, presenting symptoms and labs conducted in the emergency department today with Dr. Gwen PoundsKowalski.  Specifically, we discussed patient's renal function and concern for diuresis. Creatinine 1.43  Dr.Kowalski recommended IV Lasix in emergency department 40 to 80 mg to facilitate symptomatic improvement. Dr. Gwen PoundsKowalski conveyed that patient can be seen in the office next week.    Assessment and Plan: CHF exacerbation:  68 year old male presents to the emergency department with progressive shortness of breath worsened  with exertion with orthopnea and paroxysmal nocturnal dyspnea for the past 2 to 3 days.  On physical exam, patient appears short of breath with prolonged speech.  He is tachypneic.  Breath sounds are diminished in the lung bases bilaterally.  No accessory muscle use for respiration.  Differential diagnosis included CHF exacerbation, COVID-19, community-acquired pneumonia, STEMI...  BNP elevated.  Chest x-ray shows pulmonary congestion.  EKG revealed sinus rhythm with occasional PVCs but no ST segment elevation or apparent arrhythmia.  CMP was concerning for elevated creatinine 1.43.  Please see note documenting correspondence with cardiology.  Cardiologist recommended IV Lasix in the emergency department.  Patient was able to urinate multiple times prior to discharge and stated that he felt much better.  Patient was advised to increase his torsemide to 120 mg daily until he sees cardiology.  He voiced understanding and felt comfortable leaving the emergency department.  All patient questions were answered.       ____________________________________________  FINAL CLINICAL IMPRESSION(S) / ED DIAGNOSES  Final diagnoses:  None      NEW MEDICATIONS STARTED DURING THIS VISIT:  ED Discharge Orders    None          This chart was dictated using voice recognition software/Dragon.  Despite best efforts to proofread, errors can occur which can change the meaning. Any change was purely unintentional.    Orvil FeilWoods, Iretha Kirley M, PA-C 06/05/19 1755    Jeanmarie PlantMcShane, Delsin A, MD 06/06/19 [phone]

## 2019-06-05 NOTE — ED Provider Notes (Signed)
-----------------------------------------   3:21 PM on 06/05/2019 -----------------------------------------  Patient with a history of significant CHF, on home fluid pills, presents today because he is concerned that he might get more short of breath.  He is actually not particular short of breath he is actually walking around doing everything he normally does.  He does have increased orthopnea, he has had a slight cough for the last month, he states that he just became anxious that he might get worse.  He is taking his medications as prescribed, his vital signs are reassuring, and he has good air motion, he is slightly elevated but we do not know a baseline.  Creatinine is somewhat elevated.  We have talked to his cardiologist to recommend that we give him an IV dose of Lasix and follow-up with him closely in about 30 hours.  Patient has been having symptoms for the last month and looks quite well.  No exertional chest pain no chest pain of any variety troponin is negative COVID is negative chest x-ray is reassuring mild fluid is noted mostly probably chronic, I think that the patient is concerned about his possibility of decompensating because he got very sick last time is prompting him to come in which is certainly reasonable but at this time there is no indication for acute admission according to his cardiologist which I agree with.  We will discharge him after IV diuresis and he will follow closely with his cardiologist he will follow-up on his kidney function etc.   Schuyler Amor, MD 06/05/19 [phone]

## 2019-06-05 NOTE — ED Triage Notes (Signed)
Pt c/o increased SOB for the past week, pt c/o cough with congestion for the past month.

## 2019-06-05 NOTE — Discharge Instructions (Addendum)
Increase torsemide to 120 mg daily until you see your cardiologist. If you do not hear from your cardiologist, please contact their office on Monday.

## 2019-06-10 ENCOUNTER — Other Ambulatory Visit: Payer: Self-pay | Admitting: Internal Medicine

## 2019-06-10 DIAGNOSIS — Z951 Presence of aortocoronary bypass graft: Secondary | ICD-10-CM | POA: Diagnosis not present

## 2019-06-10 DIAGNOSIS — R001 Bradycardia, unspecified: Secondary | ICD-10-CM | POA: Diagnosis not present

## 2019-06-10 DIAGNOSIS — Z955 Presence of coronary angioplasty implant and graft: Secondary | ICD-10-CM | POA: Diagnosis not present

## 2019-06-10 DIAGNOSIS — R0602 Shortness of breath: Secondary | ICD-10-CM | POA: Diagnosis not present

## 2019-06-10 DIAGNOSIS — I10 Essential (primary) hypertension: Secondary | ICD-10-CM | POA: Diagnosis not present

## 2019-06-10 DIAGNOSIS — J449 Chronic obstructive pulmonary disease, unspecified: Secondary | ICD-10-CM | POA: Diagnosis not present

## 2019-06-10 DIAGNOSIS — I2510 Atherosclerotic heart disease of native coronary artery without angina pectoris: Secondary | ICD-10-CM | POA: Diagnosis not present

## 2019-06-10 DIAGNOSIS — E7849 Other hyperlipidemia: Secondary | ICD-10-CM | POA: Diagnosis not present

## 2019-06-10 DIAGNOSIS — K219 Gastro-esophageal reflux disease without esophagitis: Secondary | ICD-10-CM | POA: Diagnosis not present

## 2019-06-10 DIAGNOSIS — I429 Cardiomyopathy, unspecified: Secondary | ICD-10-CM | POA: Diagnosis not present

## 2019-06-10 DIAGNOSIS — I208 Other forms of angina pectoris: Secondary | ICD-10-CM | POA: Diagnosis not present

## 2019-06-10 DIAGNOSIS — I5022 Chronic systolic (congestive) heart failure: Secondary | ICD-10-CM | POA: Diagnosis not present

## 2019-06-17 DIAGNOSIS — R0600 Dyspnea, unspecified: Secondary | ICD-10-CM | POA: Diagnosis not present

## 2019-06-17 DIAGNOSIS — R05 Cough: Secondary | ICD-10-CM | POA: Diagnosis not present

## 2019-06-17 DIAGNOSIS — J439 Emphysema, unspecified: Secondary | ICD-10-CM | POA: Diagnosis not present

## 2019-06-17 DIAGNOSIS — Z87898 Personal history of other specified conditions: Secondary | ICD-10-CM | POA: Diagnosis not present

## 2019-07-20 DIAGNOSIS — R05 Cough: Secondary | ICD-10-CM | POA: Diagnosis not present

## 2019-07-20 DIAGNOSIS — J310 Chronic rhinitis: Secondary | ICD-10-CM | POA: Diagnosis not present

## 2019-07-20 DIAGNOSIS — J439 Emphysema, unspecified: Secondary | ICD-10-CM | POA: Diagnosis not present

## 2019-07-21 ENCOUNTER — Other Ambulatory Visit: Payer: Self-pay | Admitting: Internal Medicine

## 2019-07-21 DIAGNOSIS — E782 Mixed hyperlipidemia: Secondary | ICD-10-CM

## 2019-07-27 ENCOUNTER — Telehealth: Payer: Self-pay

## 2019-07-27 NOTE — Telephone Encounter (Signed)
Patient called and left Vm asking to have his appointment canceled for tomorrow. Please cancel pt appointment and call him to reschedule.  Thank you.

## 2019-07-28 ENCOUNTER — Ambulatory Visit: Payer: Medicare Other

## 2019-08-09 ENCOUNTER — Other Ambulatory Visit: Payer: Self-pay | Admitting: Internal Medicine

## 2019-08-11 IMAGING — CR DG CHEST 2V
2 series · 3 of 3 positions shown · non-contrast
Comparison: 02/13/2017

CLINICAL DATA: Shortness of breath, cough, wheezing, chest
congestion

EXAM:
CHEST  2 VIEW

[Series 1: chest pa · 0.14mm/px · 2 of 2 slices shown]
[im 1/2]
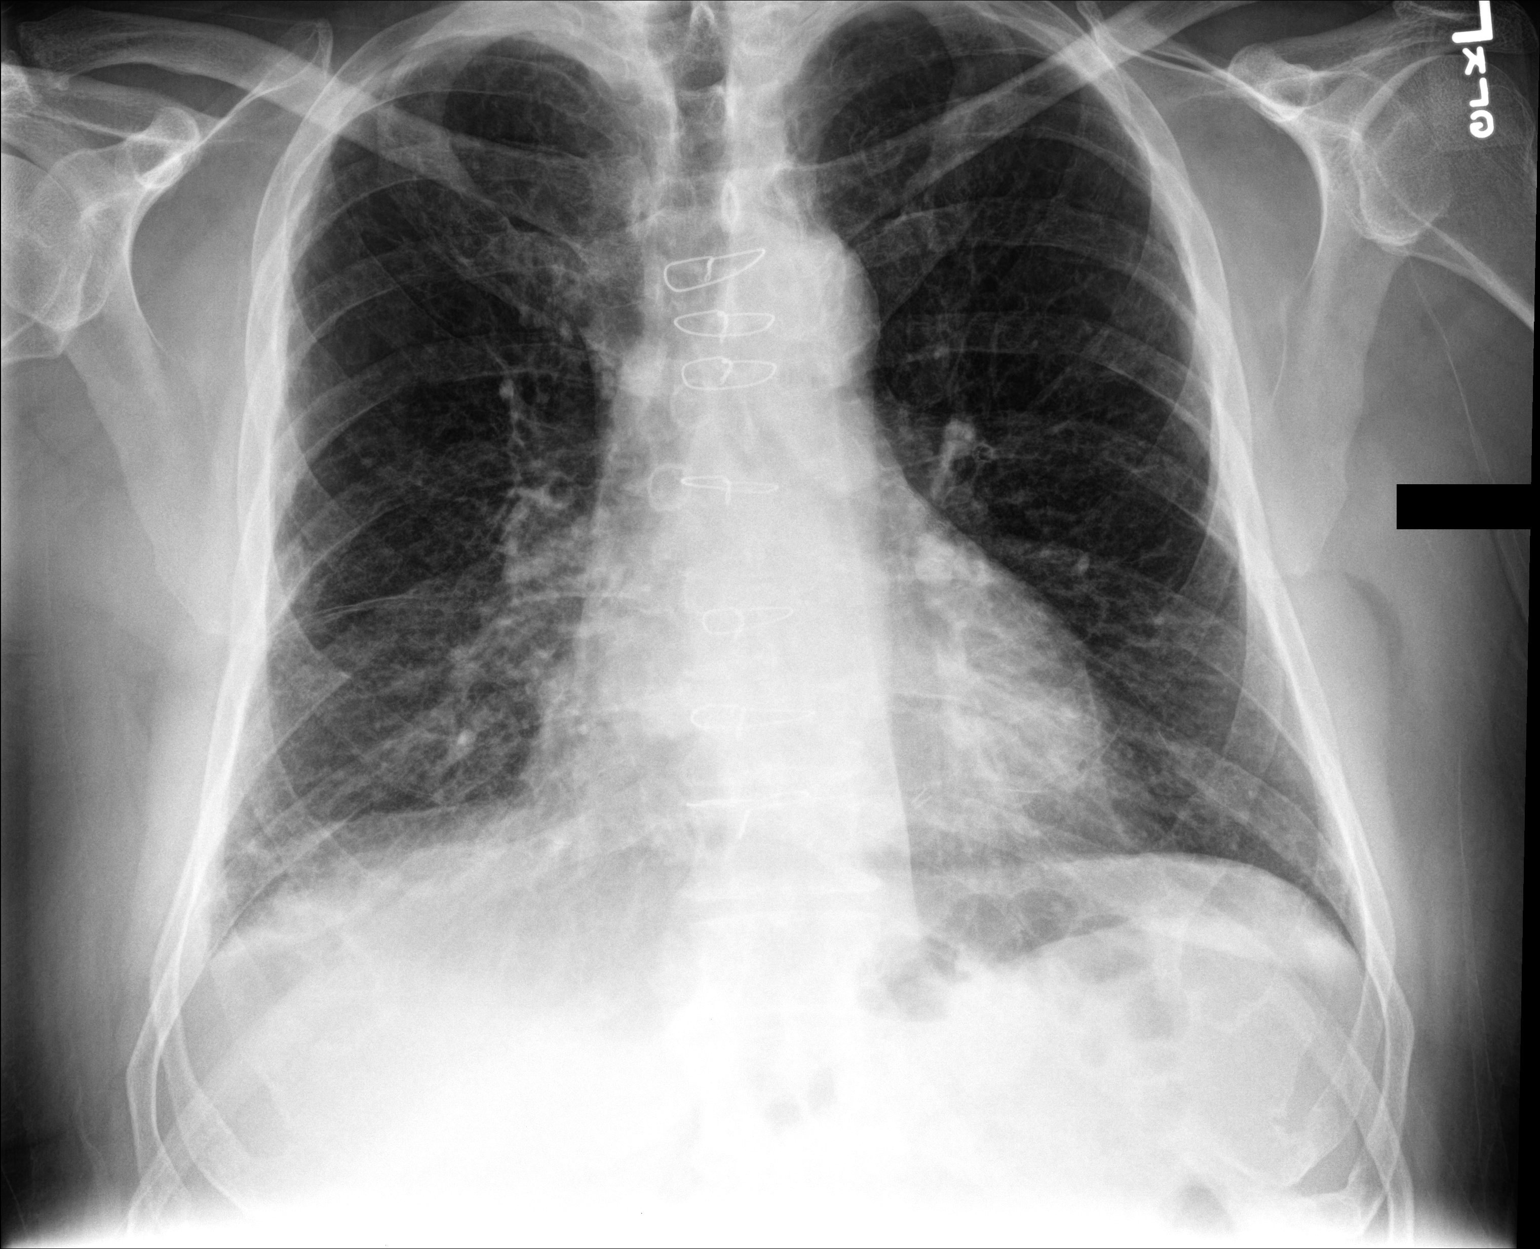
[im 2/2]
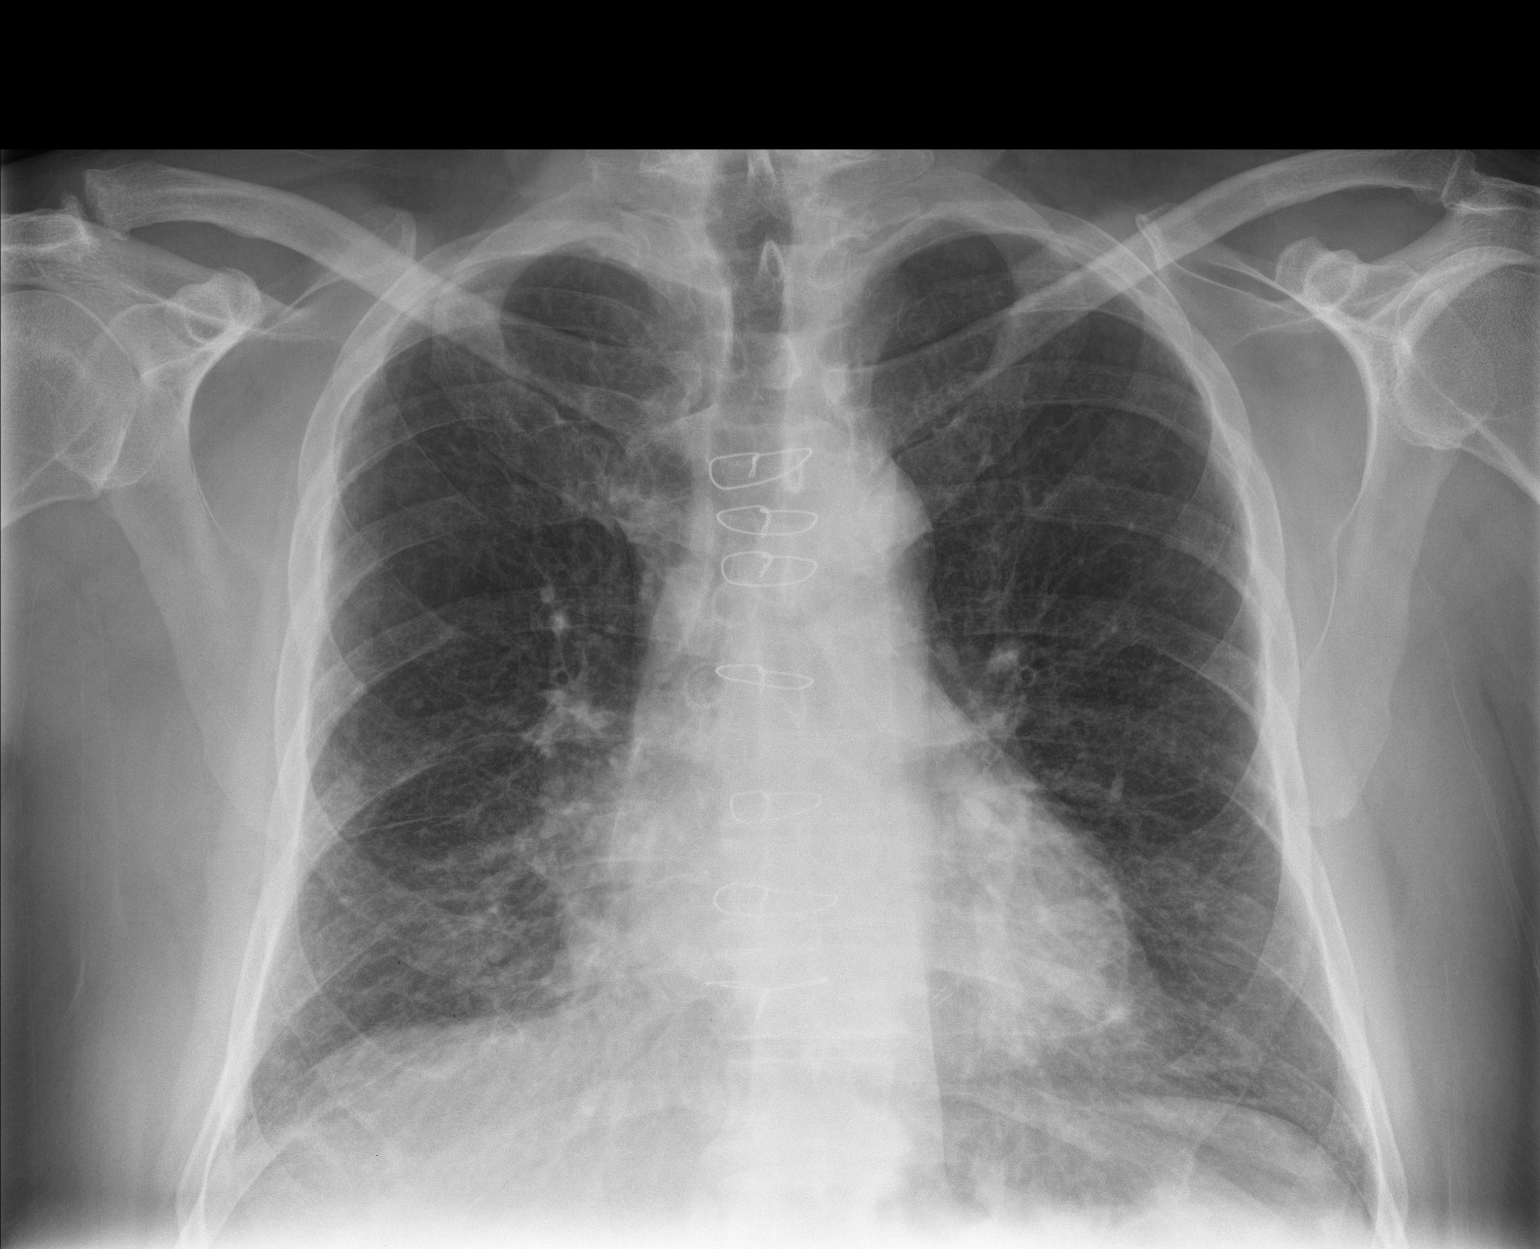

[chest lat]
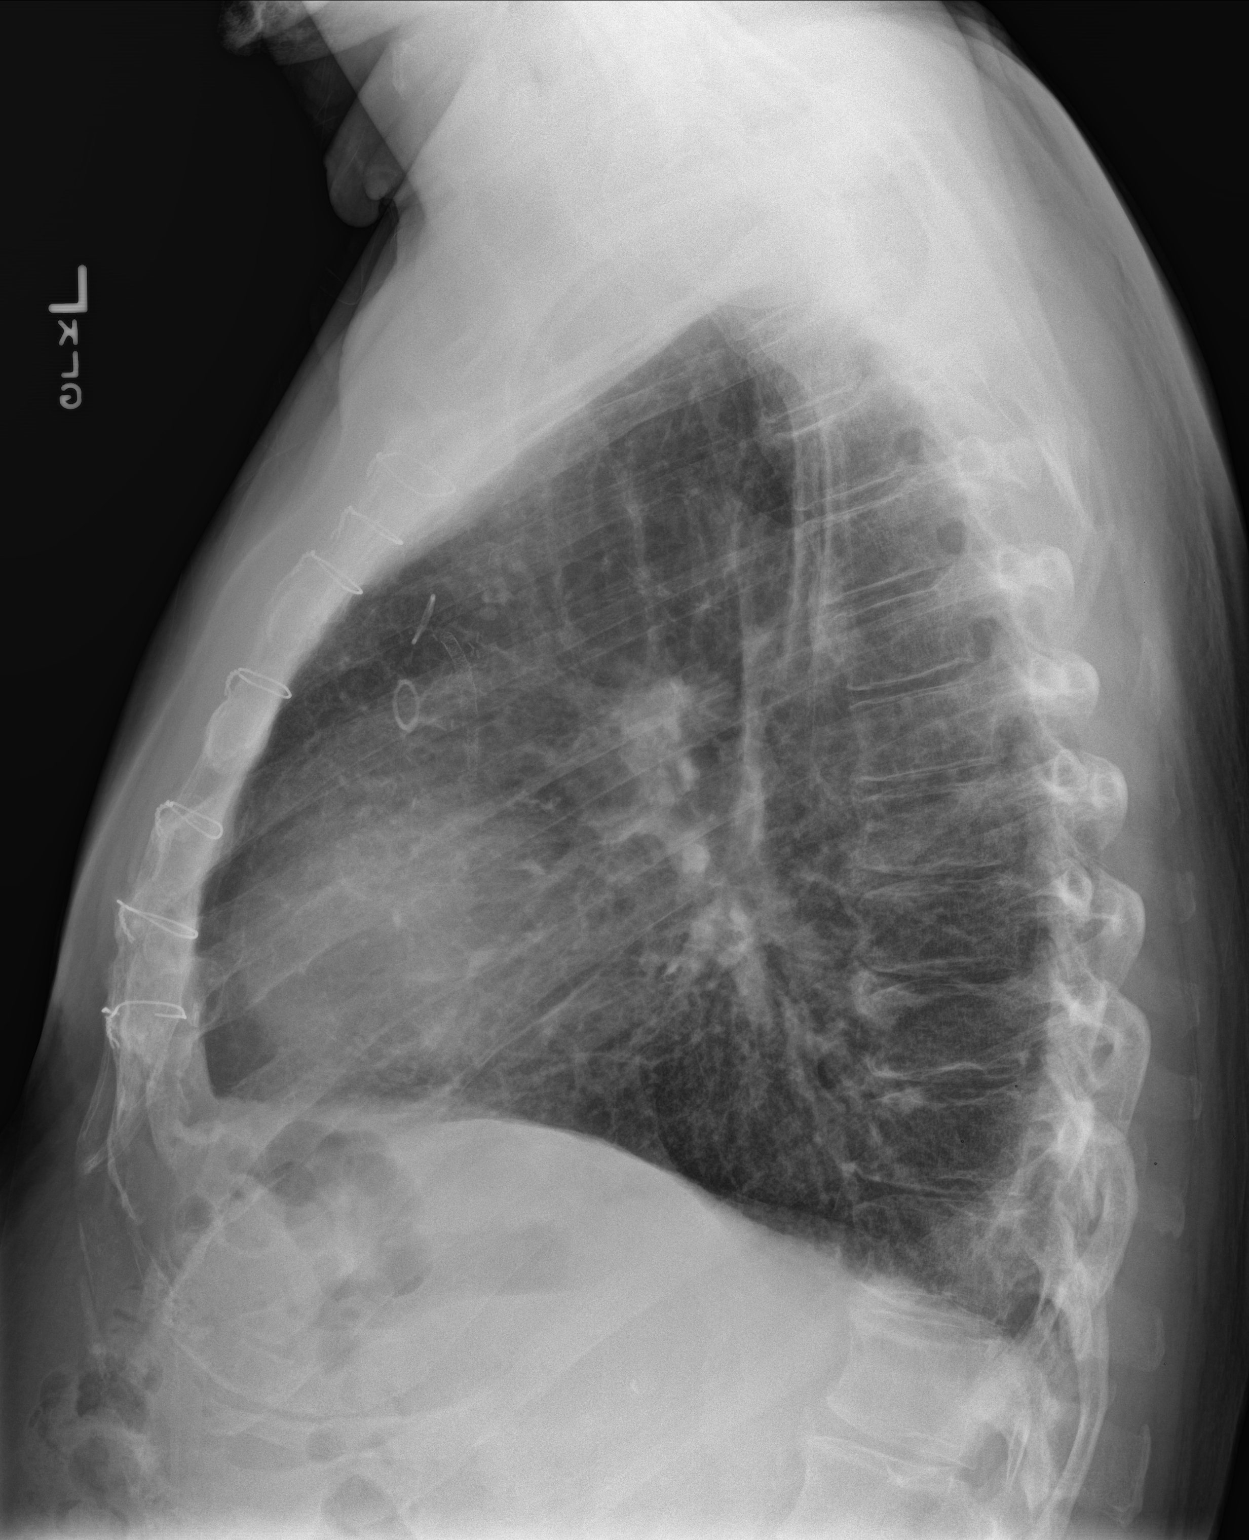

[3 of 3 positions shown; findings below may reference images not displayed]

FINDINGS: Prior CABG. There is hyperinflation of the lungs compatible with
COPD. Chronic interstitial prominence throughout the lungs. Scarring
in the lung bases. No effusions or confluent airspace opacities.
Heart is normal size.
IMPRESSION: COPD/chronic changes.  No active disease.

## 2019-08-12 ENCOUNTER — Other Ambulatory Visit: Payer: Self-pay | Admitting: Internal Medicine

## 2019-08-12 DIAGNOSIS — Z955 Presence of coronary angioplasty implant and graft: Secondary | ICD-10-CM | POA: Diagnosis not present

## 2019-08-12 DIAGNOSIS — E7849 Other hyperlipidemia: Secondary | ICD-10-CM | POA: Diagnosis not present

## 2019-08-12 DIAGNOSIS — J449 Chronic obstructive pulmonary disease, unspecified: Secondary | ICD-10-CM | POA: Diagnosis not present

## 2019-08-12 DIAGNOSIS — I10 Essential (primary) hypertension: Secondary | ICD-10-CM | POA: Diagnosis not present

## 2019-08-12 DIAGNOSIS — I5022 Chronic systolic (congestive) heart failure: Secondary | ICD-10-CM | POA: Diagnosis not present

## 2019-08-12 DIAGNOSIS — IMO0001 Reserved for inherently not codable concepts without codable children: Secondary | ICD-10-CM

## 2019-08-12 DIAGNOSIS — R0602 Shortness of breath: Secondary | ICD-10-CM | POA: Diagnosis not present

## 2019-08-12 DIAGNOSIS — I2510 Atherosclerotic heart disease of native coronary artery without angina pectoris: Secondary | ICD-10-CM | POA: Diagnosis not present

## 2019-08-12 DIAGNOSIS — K219 Gastro-esophageal reflux disease without esophagitis: Secondary | ICD-10-CM | POA: Diagnosis not present

## 2019-08-12 DIAGNOSIS — I429 Cardiomyopathy, unspecified: Secondary | ICD-10-CM | POA: Diagnosis not present

## 2019-08-12 DIAGNOSIS — I208 Other forms of angina pectoris: Secondary | ICD-10-CM | POA: Diagnosis not present

## 2019-08-12 DIAGNOSIS — R001 Bradycardia, unspecified: Secondary | ICD-10-CM | POA: Diagnosis not present

## 2019-08-12 DIAGNOSIS — Z951 Presence of aortocoronary bypass graft: Secondary | ICD-10-CM | POA: Diagnosis not present

## 2019-08-13 NOTE — Telephone Encounter (Signed)
Find out how many units he is actually taking per day.

## 2019-09-18 ENCOUNTER — Other Ambulatory Visit: Payer: Self-pay | Admitting: Internal Medicine

## 2019-09-18 DIAGNOSIS — M539 Dorsopathy, unspecified: Secondary | ICD-10-CM

## 2019-10-18 ENCOUNTER — Other Ambulatory Visit: Payer: Self-pay

## 2019-10-18 ENCOUNTER — Encounter: Payer: Self-pay | Admitting: Internal Medicine

## 2019-10-18 ENCOUNTER — Ambulatory Visit (INDEPENDENT_AMBULATORY_CARE_PROVIDER_SITE_OTHER): Payer: Medicare Other | Admitting: Internal Medicine

## 2019-10-18 VITALS — BP 118/64 | HR 89 | Ht 69.0 in | Wt 188.0 lb

## 2019-10-18 DIAGNOSIS — E1169 Type 2 diabetes mellitus with other specified complication: Secondary | ICD-10-CM | POA: Diagnosis not present

## 2019-10-18 DIAGNOSIS — E785 Hyperlipidemia, unspecified: Secondary | ICD-10-CM

## 2019-10-18 DIAGNOSIS — E118 Type 2 diabetes mellitus with unspecified complications: Secondary | ICD-10-CM | POA: Diagnosis not present

## 2019-10-18 DIAGNOSIS — I5022 Chronic systolic (congestive) heart failure: Secondary | ICD-10-CM

## 2019-10-18 DIAGNOSIS — I10 Essential (primary) hypertension: Secondary | ICD-10-CM | POA: Diagnosis not present

## 2019-10-18 DIAGNOSIS — I25119 Atherosclerotic heart disease of native coronary artery with unspecified angina pectoris: Secondary | ICD-10-CM

## 2019-10-18 DIAGNOSIS — Z23 Encounter for immunization: Secondary | ICD-10-CM

## 2019-10-18 DIAGNOSIS — J449 Chronic obstructive pulmonary disease, unspecified: Secondary | ICD-10-CM

## 2019-10-18 DIAGNOSIS — M539 Dorsopathy, unspecified: Secondary | ICD-10-CM

## 2019-10-18 MED ORDER — IBUPROFEN 800 MG PO TABS: ORAL_TABLET | ORAL | 0 refills | Status: DC

## 2019-10-18 MED ORDER — NITROGLYCERIN 0.4 MG SL SUBL: 0.4000 mg | SUBLINGUAL_TABLET | SUBLINGUAL | 1 refills | Status: DC | PRN

## 2019-10-18 NOTE — Progress Notes (Signed)
Date:  10/18/2019   Name:  Gary Gutierrez   DOB:  26-May-1951   MRN:  [id number]   Chief Complaint: Jaw Pain (High dose flu shot. Tooth pain/ possible abcess. Better now. Gums were swollen but now better. ) and Shortness of Breath (Doubled up on meds per cardiology but sometimes having chest wall pain. Patient says its not his "heart." )  Diabetes He presents for his follow-up diabetic visit. He has type 2 diabetes mellitus. His disease course has been stable. Pertinent negatives for hypoglycemia include no headaches or tremors. Pertinent negatives for diabetes include no chest pain, no fatigue, no polydipsia and no polyuria. He monitors blood glucose at home 1-2 x per day. His breakfast blood glucose is taken between 7-8 am. His breakfast blood glucose range is generally 140-180 mg/dl. An ACE inhibitor/angiotensin II receptor blocker is not being taken. Eye exam is current.  CAD/CHF - has noticed that he works harder to breath over the past few weeks.  He denies cough, wheezing, fever.  He recovers his breath in a few seconds.  Lab Results  Component Value Date   HGBA1C 6.1 (H) 04/22/2019   Lab Results  Component Value Date   CREATININE 1.43 (H) 06/05/2019   BUN 22 06/05/2019   NA 140 06/05/2019   K 3.8 06/05/2019   CL 104 06/05/2019   CO2 22 06/05/2019   Lab Results  Component Value Date   CHOL 140 10/27/2018   HDL 27 (L) 10/27/2018   LDLCALC 65 10/27/2018   TRIG 239 (H) 10/27/2018   CHOLHDL 5.2 (H) 10/27/2018    Review of Systems  Constitutional: Negative for appetite change, fatigue and unexpected weight change.  HENT: Positive for dental problem.   Eyes: Negative for visual disturbance.  Respiratory: Positive for shortness of breath. Negative for cough, choking, chest tightness and wheezing.   Cardiovascular: Negative for chest pain, palpitations and leg swelling.  Gastrointestinal: Negative for abdominal pain and blood in stool.  Endocrine: Negative for polydipsia  and polyuria.  Genitourinary: Negative for dysuria and hematuria.  Skin: Negative for color change and rash.  Neurological: Negative for tremors, numbness and headaches.  Psychiatric/Behavioral: Negative for dysphoric mood.    Patient Active Problem List   Diagnosis Date Noted  . Type II diabetes mellitus with complication ([place]) 10/23/8526  . Chronic obstructive pulmonary disease ([place]) 02/11/2018  . CHF (congestive heart failure) ([place]) 10/29/2017  . Polyneuropathy due to type 2 diabetes mellitus ([place]) 07/01/2017  . DJD (degenerative joint disease), lumbosacral 07/01/2017  . PAD (peripheral artery disease) ([place]) 06/30/2017  . Tobacco use disorder, mild, in sustained remission, abuse 04/10/2017  . Hx of colonic polyps 04/10/2016  . Hearing loss of both ears 01/03/2016  . Hyperlipidemia associated with type 2 diabetes mellitus ([place]) 06/09/2015  . Essential hypertension 06/09/2015  . Multilevel degenerative disc disease 06/09/2015  . Coronary artery disease involving native coronary artery of native heart with angina pectoris ([place]) 05/09/2014    Allergies  Allergen Reactions  . Entresto [Sacubitril-Valsartan] Other (See Comments)    Low blood pressure  . Penicillins Other (See Comments)    Other reaction(s): Unknown, pt does not know reactions    Past Surgical History:  Procedure Laterality Date  . APPENDECTOMY    . colonscopy  2010   benign polyps  . CORONARY ARTERY BYPASS GRAFT  1995  . CORONARY STENT INTERVENTION N/A 02/03/2017   Procedure: Coronary Stent Intervention;  Surgeon: Yolonda Kida, MD;  Location: [facility];  Service: Cardiovascular;  Laterality: N/A;  . ESOPHAGOGASTRODUODENOSCOPY  2010   normal  . LEFT HEART CATH AND CORONARY ANGIOGRAPHY N/A 02/03/2017   Procedure: Left Heart Cath and Coronary Angiography;  Surgeon: Alwyn Peawayne D Callwood, MD;  Location: [facility];  Service: Cardiovascular;  Laterality: N/A;  . PERCUTANEOUS CORONARY STENT  INTERVENTION (PCI-S)  2010, 2015   4 stents total    Social History   Tobacco Use  . Smoking status: Former Smoker    Packs/day: 1.50    Years: 29.00    Pack years: 43.50    Types: Cigarettes    Quit date: 12/30/1994    Years since quitting: 24.8  . Smokeless tobacco: Never Used  . Tobacco comment: smoking cessation materials not required  Substance Use Topics  . Alcohol use: No    Alcohol/week: 0.0 standard drinks  . Drug use: No     Medication list has been reviewed and updated.  Current Meds  Medication Sig  . ACCU-CHEK AVIVA PLUS test strip TEST BLOOD SUGAR EVERY DAY AS DIRECTED  . allopurinol (ZYLOPRIM) 300 MG tablet TAKE 1 TABLET BY MOUTH EVERY DAY  . aspirin EC 81 MG tablet Take 81 mg by mouth daily. Reported on 04/10/2016  . atorvastatin (LIPITOR) 80 MG tablet TAKE 1 TABLET(80 MG) BY MOUTH AT BEDTIME  . carvedilol (COREG) 12.5 MG tablet TAKE 1 TABLET(12.5 MG) BY MOUTH TWICE DAILY WITH A MEAL (Patient taking differently: Take 6.5 mg by mouth 2 (two) times daily with a meal. )  . clopidogrel (PLAVIX) 75 MG tablet TAKE 1 TABLET(75 MG) BY MOUTH DAILY  . glipiZIDE (GLUCOTROL XL) 10 MG 24 hr tablet TAKE 1 TABLET(10 MG) BY MOUTH DAILY  . ibuprofen (ADVIL) 800 MG tablet TAKE 1 TABLET(800 MG) BY MOUTH EVERY 8 HOURS AS NEEDED  . Insulin Glargine (LANTUS SOLOSTAR) 100 UNIT/ML Solostar Pen Inject 50 Units into the skin daily.  . Insulin Pen Needle (PEN NEEDLES 31GX5/16") 31G X 8 MM MISC 1 each by Does not apply route daily.  . isosorbide mononitrate (IMDUR) 30 MG 24 hr tablet Take 1 tablet (30 mg total) by mouth daily.  Marland Kitchen. JARDIANCE 25 MG TABS tablet TAKE 1 TABLET BY MOUTH DAILY  . nitroGLYCERIN (NITROSTAT) 0.4 MG SL tablet Place 1 tablet (0.4 mg total) under the tongue every 5 (five) minutes as needed for chest pain. (Patient taking differently: Place 0.4 mg under the tongue every 5 (five) minutes as needed for chest pain. )  . pantoprazole (PROTONIX) 40 MG tablet TAKE 1 TABLET(40  MG) BY MOUTH TWICE DAILY  . potassium chloride SA (K-DUR,KLOR-CON) 20 MEQ tablet TAKE 2 TABLETS(40 MEQ) BY MOUTH DAILY  . torsemide (DEMADEX) 20 MG tablet TAKE 2 TABLETS(40 MG) BY MOUTH TWICE DAILY (Patient taking differently: Take 80 mg by mouth 2 (two) times daily. )  . umeclidinium-vilanterol (ANORO ELLIPTA) 62.5-25 MCG/INH AEPB Inhale 1 puff into the lungs daily.    PHQ 2/9 Scores 04/22/2019 07/22/2018 07/14/2018 04/06/2018  PHQ - 2 Score 0 0 0 0  PHQ- 9 Score - 0 - -    BP Readings from Last 3 Encounters:  10/18/19 118/64  06/05/19 127/87  04/22/19 128/64    Physical Exam Vitals signs and nursing note reviewed.  Constitutional:      General: He is not in acute distress.    Appearance: He is well-developed.  HENT:     Head: Normocephalic and atraumatic.  Neck:     Vascular: No JVD.  Cardiovascular:     Rate and Rhythm: Normal rate and regular rhythm.  Pulmonary:     Effort: Pulmonary effort is normal. No respiratory distress.     Breath sounds: Decreased breath sounds present. No wheezing or rhonchi.  Chest:     Chest wall: No mass or tenderness.  Abdominal:     General: Bowel sounds are normal.     Palpations: Abdomen is soft. There is no shifting dullness or fluid wave.     Tenderness: There is no abdominal tenderness. There is no right CVA tenderness or left CVA tenderness.  Musculoskeletal: Normal range of motion.     Right lower leg: No edema.     Left lower leg: No edema.  Skin:    General: Skin is warm and dry.     Capillary Refill: Capillary refill takes less than 2 seconds.     Findings: No rash.  Neurological:     General: No focal deficit present.     Mental Status: He is alert and oriented to person, place, and time.  Psychiatric:        Attention and Perception: Attention normal.        Behavior: Behavior normal.        Thought Content: Thought content normal.     Wt Readings from Last 3 Encounters:  10/18/19 188 lb (85.3 kg)  06/05/19 182 lb (82.6  kg)  04/22/19 186 lb 6.4 oz (84.6 kg)    BP 118/64   Pulse 89   Ht 5\' 9"  (1.753 m)   Wt 188 lb (85.3 kg)   SpO2 97%   BMI 27.76 kg/m   Assessment and Plan: 1. Chronic systolic congestive heart failure (HCC) Mild increase in SOB since [place] Appears euvolemic today - continue current regimen and follow up with cardiology - Comprehensive metabolic panel - B Nat Peptide - nitroGLYCERIN (NITROSTAT) 0.4 MG SL tablet; Place 1 tablet (0.4 mg total) under the tongue every 5 (five) minutes as needed for chest pain.  Dispense: 30 tablet; Refill: 1  2. Essential hypertension Clinically stable exam with well controlled BP.   Tolerating medications, Coreg and torsemide, without side effects at this time. Pt to continue current regimen and low sodium diet; benefits of regular exercise as able discussed.  3. Type II diabetes mellitus with complication (HCC) Clinically stable by exam and report without s/s of hypoglycemia. DM complicated by CAD, HTN, lipids. Tolerating medications Jardiance, glipizide and insulin well without side effects or other concerns. - Comprehensive metabolic panel - Hemoglobin A1c  4. Chronic obstructive pulmonary disease, unspecified COPD type (HCC) Stable; continue Anoro  5. Multilevel degenerative disc disease - ibuprofen (ADVIL) 800 MG tablet; TAKE 1 TABLET(800 MG) BY MOUTH EVERY 8 HOURS AS NEEDED  Dispense: 30 tablet; Refill: 0  6. Hyperlipidemia associated with type 2 diabetes mellitus (HCC) Tolerating statin medication without side effects at this time LDL is at goal of < 70 on current dose Continue same therapy without change at this time. - Lipid panel   Partially dictated using . Any errors are unintentional.  Animal nutritionist, MD [facility]  10/18/2019

## 2019-10-19 ENCOUNTER — Other Ambulatory Visit: Payer: Self-pay | Admitting: Internal Medicine

## 2019-10-19 LAB — COMPREHENSIVE METABOLIC PANEL
ALT: 19 IU/L (ref 0–44)
AST: 20 IU/L (ref 0–40)
Albumin/Globulin Ratio: 1.6 (ref 1.2–2.2)
Albumin: 4.5 g/dL (ref 3.8–4.8)
Alkaline Phosphatase: 159 IU/L — ABNORMAL HIGH (ref 39–117)
BUN/Creatinine Ratio: 16 (ref 10–24)
BUN: 23 mg/dL (ref 8–27)
Bilirubin Total: 0.6 mg/dL (ref 0.0–1.2)
CO2: 27 mmol/L (ref 20–29)
Calcium: 9.5 mg/dL (ref 8.6–10.2)
Chloride: 98 mmol/L (ref 96–106)
Creatinine, Ser: 1.45 mg/dL — ABNORMAL HIGH (ref 0.76–1.27)
GFR calc Af Amer: 56 mL/min/{1.73_m2} — ABNORMAL LOW (ref >59)
GFR calc non Af Amer: 49 mL/min/{1.73_m2} — ABNORMAL LOW (ref >59)
Globulin, Total: 2.8 g/dL (ref 1.5–4.5)
Glucose: 153 mg/dL — ABNORMAL HIGH (ref 65–99)
Potassium: 4.5 mmol/L (ref 3.5–5.2)
Sodium: 142 mmol/L (ref 134–144)
Total Protein: 7.3 g/dL (ref 6.0–8.5)

## 2019-10-19 LAB — LIPID PANEL
Chol/HDL Ratio: 4.3 ratio (ref 0.0–5.0)
Cholesterol, Total: 150 mg/dL (ref 100–199)
HDL: 35 mg/dL — ABNORMAL LOW (ref >39)
LDL Chol Calc (NIH): 84 mg/dL (ref 0–99)
Triglycerides: 179 mg/dL — ABNORMAL HIGH (ref 0–149)
VLDL Cholesterol Cal: 31 mg/dL (ref 5–40)

## 2019-10-19 LAB — HEMOGLOBIN A1C
Est. average glucose Bld gHb Est-mCnc: 143 mg/dL
Hgb A1c MFr Bld: 6.6 % — ABNORMAL HIGH (ref 4.8–5.6)

## 2019-10-19 LAB — BRAIN NATRIURETIC PEPTIDE: BNP: 272 pg/mL — ABNORMAL HIGH (ref 0.0–100.0)

## 2019-10-22 ENCOUNTER — Other Ambulatory Visit
Admission: RE | Admit: 2019-10-22 | Discharge: 2019-10-22 | Disposition: A | Discharge status: Home / Self Care | Payer: Medicare Other | Source: Ambulatory Visit | Attending: Internal Medicine | Admitting: Internal Medicine

## 2019-10-22 DIAGNOSIS — I208 Other forms of angina pectoris: Secondary | ICD-10-CM | POA: Diagnosis not present

## 2019-10-22 DIAGNOSIS — R0602 Shortness of breath: Secondary | ICD-10-CM | POA: Insufficient documentation

## 2019-10-22 DIAGNOSIS — I429 Cardiomyopathy, unspecified: Secondary | ICD-10-CM | POA: Diagnosis not present

## 2019-10-22 DIAGNOSIS — I2510 Atherosclerotic heart disease of native coronary artery without angina pectoris: Secondary | ICD-10-CM | POA: Insufficient documentation

## 2019-10-22 LAB — BRAIN NATRIURETIC PEPTIDE: B Natriuretic Peptide: 329 pg/mL — ABNORMAL HIGH (ref 0.0–100.0)

## 2019-10-23 ENCOUNTER — Other Ambulatory Visit: Payer: Self-pay | Admitting: Family

## 2019-10-23 ENCOUNTER — Other Ambulatory Visit: Payer: Self-pay | Admitting: Internal Medicine

## 2019-10-25 ENCOUNTER — Encounter: Payer: Medicare Other | Admitting: Internal Medicine

## 2019-10-25 DIAGNOSIS — I208 Other forms of angina pectoris: Secondary | ICD-10-CM | POA: Diagnosis not present

## 2019-10-25 DIAGNOSIS — R0602 Shortness of breath: Secondary | ICD-10-CM | POA: Diagnosis not present

## 2019-10-25 DIAGNOSIS — Z955 Presence of coronary angioplasty implant and graft: Secondary | ICD-10-CM | POA: Diagnosis not present

## 2019-10-25 DIAGNOSIS — J449 Chronic obstructive pulmonary disease, unspecified: Secondary | ICD-10-CM | POA: Diagnosis not present

## 2019-10-25 DIAGNOSIS — E7849 Other hyperlipidemia: Secondary | ICD-10-CM | POA: Diagnosis not present

## 2019-10-25 DIAGNOSIS — I2510 Atherosclerotic heart disease of native coronary artery without angina pectoris: Secondary | ICD-10-CM | POA: Diagnosis not present

## 2019-10-25 DIAGNOSIS — I5022 Chronic systolic (congestive) heart failure: Secondary | ICD-10-CM | POA: Diagnosis not present

## 2019-10-25 DIAGNOSIS — M1990 Unspecified osteoarthritis, unspecified site: Secondary | ICD-10-CM | POA: Diagnosis not present

## 2019-10-25 DIAGNOSIS — I429 Cardiomyopathy, unspecified: Secondary | ICD-10-CM | POA: Diagnosis not present

## 2019-10-25 DIAGNOSIS — K219 Gastro-esophageal reflux disease without esophagitis: Secondary | ICD-10-CM | POA: Diagnosis not present

## 2019-10-25 DIAGNOSIS — Z951 Presence of aortocoronary bypass graft: Secondary | ICD-10-CM | POA: Diagnosis not present

## 2019-10-25 DIAGNOSIS — I10 Essential (primary) hypertension: Secondary | ICD-10-CM | POA: Diagnosis not present

## 2019-11-17 ENCOUNTER — Inpatient Hospital Stay
Admission: EM | Admit: 2019-11-17 | Discharge: 2019-11-19 | DRG: 871 | Disposition: A | Discharge status: Other Acute Inpatient Hospital | Payer: Medicare Other | Attending: Internal Medicine | Admitting: Internal Medicine

## 2019-11-17 ENCOUNTER — Emergency Department: Payer: Medicare Other

## 2019-11-17 ENCOUNTER — Other Ambulatory Visit: Payer: Self-pay

## 2019-11-17 ENCOUNTER — Inpatient Hospital Stay: Payer: Medicare Other

## 2019-11-17 DIAGNOSIS — R609 Edema, unspecified: Secondary | ICD-10-CM

## 2019-11-17 DIAGNOSIS — U071 COVID-19: Secondary | ICD-10-CM | POA: Diagnosis present

## 2019-11-17 DIAGNOSIS — Z87891 Personal history of nicotine dependence: Secondary | ICD-10-CM | POA: Diagnosis not present

## 2019-11-17 DIAGNOSIS — Z7902 Long term (current) use of antithrombotics/antiplatelets: Secondary | ICD-10-CM | POA: Diagnosis not present

## 2019-11-17 DIAGNOSIS — Z801 Family history of malignant neoplasm of trachea, bronchus and lung: Secondary | ICD-10-CM

## 2019-11-17 DIAGNOSIS — Z794 Long term (current) use of insulin: Secondary | ICD-10-CM | POA: Diagnosis not present

## 2019-11-17 DIAGNOSIS — E1122 Type 2 diabetes mellitus with diabetic chronic kidney disease: Secondary | ICD-10-CM | POA: Diagnosis not present

## 2019-11-17 DIAGNOSIS — E785 Hyperlipidemia, unspecified: Secondary | ICD-10-CM | POA: Diagnosis present

## 2019-11-17 DIAGNOSIS — R7989 Other specified abnormal findings of blood chemistry: Secondary | ICD-10-CM

## 2019-11-17 DIAGNOSIS — I130 Hypertensive heart and chronic kidney disease with heart failure and stage 1 through stage 4 chronic kidney disease, or unspecified chronic kidney disease: Secondary | ICD-10-CM | POA: Diagnosis present

## 2019-11-17 DIAGNOSIS — A419 Sepsis, unspecified organism: Secondary | ICD-10-CM

## 2019-11-17 DIAGNOSIS — N1831 Chronic kidney disease, stage 3a: Secondary | ICD-10-CM | POA: Diagnosis not present

## 2019-11-17 DIAGNOSIS — I2510 Atherosclerotic heart disease of native coronary artery without angina pectoris: Secondary | ICD-10-CM | POA: Diagnosis not present

## 2019-11-17 DIAGNOSIS — A4189 Other specified sepsis: Principal | ICD-10-CM | POA: Diagnosis present

## 2019-11-17 DIAGNOSIS — J440 Chronic obstructive pulmonary disease with acute lower respiratory infection: Secondary | ICD-10-CM | POA: Diagnosis not present

## 2019-11-17 DIAGNOSIS — Z791 Long term (current) use of non-steroidal anti-inflammatories (NSAID): Secondary | ICD-10-CM | POA: Diagnosis not present

## 2019-11-17 DIAGNOSIS — Z79899 Other long term (current) drug therapy: Secondary | ICD-10-CM | POA: Diagnosis not present

## 2019-11-17 DIAGNOSIS — Z8249 Family history of ischemic heart disease and other diseases of the circulatory system: Secondary | ICD-10-CM

## 2019-11-17 DIAGNOSIS — J9600 Acute respiratory failure, unspecified whether with hypoxia or hypercapnia: Secondary | ICD-10-CM | POA: Diagnosis not present

## 2019-11-17 DIAGNOSIS — E1165 Type 2 diabetes mellitus with hyperglycemia: Secondary | ICD-10-CM | POA: Diagnosis not present

## 2019-11-17 DIAGNOSIS — Z833 Family history of diabetes mellitus: Secondary | ICD-10-CM | POA: Diagnosis not present

## 2019-11-17 DIAGNOSIS — R600 Localized edema: Secondary | ICD-10-CM | POA: Diagnosis not present

## 2019-11-17 DIAGNOSIS — Z951 Presence of aortocoronary bypass graft: Secondary | ICD-10-CM

## 2019-11-17 DIAGNOSIS — R05 Cough: Secondary | ICD-10-CM | POA: Diagnosis not present

## 2019-11-17 DIAGNOSIS — J1289 Other viral pneumonia: Secondary | ICD-10-CM | POA: Diagnosis not present

## 2019-11-17 DIAGNOSIS — R509 Fever, unspecified: Secondary | ICD-10-CM | POA: Diagnosis not present

## 2019-11-17 DIAGNOSIS — R0602 Shortness of breath: Secondary | ICD-10-CM | POA: Diagnosis not present

## 2019-11-17 DIAGNOSIS — I5043 Acute on chronic combined systolic (congestive) and diastolic (congestive) heart failure: Secondary | ICD-10-CM | POA: Diagnosis present

## 2019-11-17 DIAGNOSIS — Z8042 Family history of malignant neoplasm of prostate: Secondary | ICD-10-CM | POA: Diagnosis not present

## 2019-11-17 DIAGNOSIS — R6520 Severe sepsis without septic shock: Secondary | ICD-10-CM

## 2019-11-17 DIAGNOSIS — E118 Type 2 diabetes mellitus with unspecified complications: Secondary | ICD-10-CM

## 2019-11-17 LAB — URINALYSIS, COMPLETE (UACMP) WITH MICROSCOPIC
Bacteria, UA: NONE SEEN
Bilirubin Urine: NEGATIVE
Glucose, UA: >=500 mg/dL — AB
Ketones, ur: NEGATIVE mg/dL
Leukocytes,Ua: NEGATIVE
Nitrite: NEGATIVE
Protein, ur: NEGATIVE mg/dL
Specific Gravity, Urine: 1.007 (ref 1.005–1.030)
Squamous Epithelial / HPF: NONE SEEN (ref 0–5)
pH: 6 (ref 5.0–8.0)

## 2019-11-17 LAB — CBC WITH DIFFERENTIAL/PLATELET
Abs Immature Granulocytes: 0.03 10*3/uL (ref 0.00–0.07)
Basophils Absolute: 0 10*3/uL (ref 0.0–0.1)
Basophils Relative: 0 %
Eosinophils Absolute: 0 10*3/uL (ref 0.0–0.5)
Eosinophils Relative: 0 %
HCT: 45.8 % (ref 39.0–52.0)
Hemoglobin: 15.7 g/dL (ref 13.0–17.0)
Immature Granulocytes: 1 %
Lymphocytes Relative: 9 %
Lymphs Abs: 0.5 10*3/uL — ABNORMAL LOW (ref 0.7–4.0)
MCH: 31.3 pg (ref 26.0–34.0)
MCHC: 34.3 g/dL (ref 30.0–36.0)
MCV: 91.2 fL (ref 80.0–100.0)
Monocytes Absolute: 0.3 10*3/uL (ref 0.1–1.0)
Monocytes Relative: 5 %
Neutro Abs: 5.1 10*3/uL (ref 1.7–7.7)
Neutrophils Relative %: 85 %
Platelets: 140 10*3/uL — ABNORMAL LOW (ref 150–400)
RBC: 5.02 MIL/uL (ref 4.22–5.81)
RDW: 15.5 % (ref 11.5–15.5)
WBC: 6 10*3/uL (ref 4.0–10.5)
nRBC: 0 % (ref 0.0–0.2)

## 2019-11-17 LAB — COMPREHENSIVE METABOLIC PANEL
ALT: 19 U/L (ref 0–44)
AST: 28 U/L (ref 15–41)
Albumin: 3.8 g/dL (ref 3.5–5.0)
Alkaline Phosphatase: 93 U/L (ref 38–126)
Anion gap: 22 — ABNORMAL HIGH (ref 5–15)
BUN: 36 mg/dL — ABNORMAL HIGH (ref 8–23)
CO2: 28 mmol/L (ref 22–32)
Calcium: 8.4 mg/dL — ABNORMAL LOW (ref 8.9–10.3)
Chloride: 85 mmol/L — ABNORMAL LOW (ref 98–111)
Creatinine, Ser: 1.57 mg/dL — ABNORMAL HIGH (ref 0.61–1.24)
GFR calc Af Amer: 51 mL/min — ABNORMAL LOW (ref >60)
GFR calc non Af Amer: 44 mL/min — ABNORMAL LOW (ref >60)
Glucose, Bld: 182 mg/dL — ABNORMAL HIGH (ref 70–99)
Potassium: 3.5 mmol/L (ref 3.5–5.1)
Sodium: 135 mmol/L (ref 135–145)
Total Bilirubin: 1 mg/dL (ref 0.3–1.2)
Total Protein: 7.9 g/dL (ref 6.5–8.1)

## 2019-11-17 LAB — FIBRIN DERIVATIVES D-DIMER (ARMC ONLY): Fibrin derivatives D-dimer (ARMC): 1944.93 ng/mL (FEU) — ABNORMAL HIGH (ref 0.00–499.00)

## 2019-11-17 LAB — C-REACTIVE PROTEIN: CRP: 10.2 mg/dL — ABNORMAL HIGH (ref <1.0)

## 2019-11-17 LAB — TROPONIN I (HIGH SENSITIVITY)
Troponin I (High Sensitivity): 84 ng/L — ABNORMAL HIGH (ref <18)
Troponin I (High Sensitivity): 139 ng/L (ref <18)
Troponin I (High Sensitivity): 158 ng/L (ref <18)

## 2019-11-17 LAB — PROTIME-INR
INR: 1 (ref 0.8–1.2)
Prothrombin Time: 13.4 seconds (ref 11.4–15.2)

## 2019-11-17 LAB — FERRITIN: Ferritin: 262 ng/mL (ref 24–336)

## 2019-11-17 LAB — APTT: aPTT: 35 seconds (ref 24–36)

## 2019-11-17 LAB — PROCALCITONIN: Procalcitonin: 1.03 ng/mL

## 2019-11-17 LAB — LACTIC ACID, PLASMA
Lactic Acid, Venous: 1.9 mmol/L (ref 0.5–1.9)
Lactic Acid, Venous: 1.8 mmol/L (ref 0.5–1.9)

## 2019-11-17 LAB — GLUCOSE, CAPILLARY: Glucose-Capillary: 138 mg/dL — ABNORMAL HIGH (ref 70–99)

## 2019-11-17 LAB — HEPARIN LEVEL (UNFRACTIONATED): Heparin Unfractionated: 0.27 IU/mL — ABNORMAL LOW (ref 0.30–0.70)

## 2019-11-17 MED ORDER — HEPARIN (PORCINE) 25000 UT/250ML-% IV SOLN
1100.0000 [IU]/h | INTRAVENOUS | Status: DC
  Administered 2019-11-17: 950 [IU]/h via INTRAVENOUS
  Filled 2019-11-17 (×2): qty 250

## 2019-11-17 MED ORDER — SODIUM CHLORIDE 0.9 % IV SOLN
500.0000 mg | INTRAVENOUS | Status: DC
  Administered 2019-11-18: 500 mg via INTRAVENOUS
  Filled 2019-11-17: qty 500

## 2019-11-17 MED ORDER — PANTOPRAZOLE SODIUM 40 MG PO TBEC
40.0000 mg | DELAYED_RELEASE_TABLET | Freq: Every day | ORAL | Status: DC
  Administered 2019-11-18: 40 mg via ORAL
  Filled 2019-11-17: qty 1

## 2019-11-17 MED ORDER — CLOPIDOGREL BISULFATE 75 MG PO TABS
75.0000 mg | ORAL_TABLET | Freq: Every day | ORAL | Status: DC
  Administered 2019-11-18: 75 mg via ORAL
  Filled 2019-11-17: qty 1

## 2019-11-17 MED ORDER — HEPARIN BOLUS VIA INFUSION
4000.0000 [IU] | Freq: Once | INTRAVENOUS | Status: AC
  Administered 2019-11-17: 4000 [IU] via INTRAVENOUS
  Filled 2019-11-17: qty 4000

## 2019-11-17 MED ORDER — LORATADINE 10 MG PO TABS
10.0000 mg | ORAL_TABLET | Freq: Every day | ORAL | Status: DC
  Administered 2019-11-18: 10 mg via ORAL
  Filled 2019-11-17: qty 1

## 2019-11-17 MED ORDER — SENNOSIDES-DOCUSATE SODIUM 8.6-50 MG PO TABS: 1.0000 | ORAL_TABLET | Freq: Every evening | ORAL | Status: DC | PRN

## 2019-11-17 MED ORDER — ONDANSETRON HCL 4 MG/2ML IJ SOLN: 4.0000 mg | Freq: Four times a day (QID) | INTRAMUSCULAR | Status: DC | PRN

## 2019-11-17 MED ORDER — ACETAMINOPHEN 500 MG PO TABS
1000.0000 mg | ORAL_TABLET | Freq: Once | ORAL | Status: AC
  Administered 2019-11-17: 1000 mg via ORAL
  Filled 2019-11-17: qty 2

## 2019-11-17 MED ORDER — INSULIN GLARGINE 100 UNIT/ML ~~LOC~~ SOLN
38.0000 [IU] | Freq: Every day | SUBCUTANEOUS | Status: DC
  Administered 2019-11-18: 38 [IU] via SUBCUTANEOUS
  Filled 2019-11-17 (×2): qty 0.38

## 2019-11-17 MED ORDER — ALLOPURINOL 300 MG PO TABS
300.0000 mg | ORAL_TABLET | Freq: Every day | ORAL | Status: DC
  Administered 2019-11-18: 300 mg via ORAL
  Filled 2019-11-17 (×3): qty 1

## 2019-11-17 MED ORDER — IPRATROPIUM-ALBUTEROL 0.5-2.5 (3) MG/3ML IN SOLN
3.0000 mL | Freq: Once | RESPIRATORY_TRACT | Status: AC
  Administered 2019-11-17: 3 mL via RESPIRATORY_TRACT
  Filled 2019-11-17: qty 3

## 2019-11-17 MED ORDER — ACETAMINOPHEN 650 MG RE SUPP: 650.0000 mg | Freq: Four times a day (QID) | RECTAL | Status: DC | PRN

## 2019-11-17 MED ORDER — BENZONATATE 100 MG PO CAPS
100.0000 mg | ORAL_CAPSULE | Freq: Three times a day (TID) | ORAL | Status: DC
  Administered 2019-11-17 – 2019-11-18 (×4): 100 mg via ORAL
  Filled 2019-11-17 (×4): qty 1

## 2019-11-17 MED ORDER — GUAIFENESIN-DM 100-10 MG/5ML PO SYRP
10.0000 mL | ORAL_SOLUTION | ORAL | Status: DC | PRN
  Administered 2019-11-17 – 2019-11-18 (×2): 10 mL via ORAL
  Filled 2019-11-17 (×3): qty 10

## 2019-11-17 MED ORDER — ISOSORBIDE MONONITRATE ER 60 MG PO TB24
30.0000 mg | ORAL_TABLET | Freq: Every day | ORAL | Status: DC
  Administered 2019-11-18: 30 mg via ORAL
  Filled 2019-11-17: qty 1

## 2019-11-17 MED ORDER — SODIUM CHLORIDE 0.9 % IV SOLN
500.0000 mg | Freq: Once | INTRAVENOUS | Status: AC
  Administered 2019-11-17: 500 mg via INTRAVENOUS
  Filled 2019-11-17: qty 500

## 2019-11-17 MED ORDER — ACETAMINOPHEN 325 MG PO TABS: 650.0000 mg | ORAL_TABLET | Freq: Four times a day (QID) | ORAL | Status: DC | PRN

## 2019-11-17 MED ORDER — ASPIRIN EC 81 MG PO TBEC
81.0000 mg | DELAYED_RELEASE_TABLET | Freq: Every day | ORAL | Status: DC
  Administered 2019-11-18: 81 mg via ORAL
  Filled 2019-11-17: qty 1

## 2019-11-17 MED ORDER — SODIUM CHLORIDE 0.9 % IV BOLUS
250.0000 mL | Freq: Once | INTRAVENOUS | Status: AC
  Administered 2019-11-17: 250 mL via INTRAVENOUS

## 2019-11-17 MED ORDER — SODIUM CHLORIDE 0.9 % IV SOLN
2.0000 g | INTRAVENOUS | Status: DC
  Administered 2019-11-17 – 2019-11-19 (×2): 2 g via INTRAVENOUS
  Filled 2019-11-17 (×2): qty 20

## 2019-11-17 MED ORDER — SACUBITRIL-VALSARTAN 24-26 MG PO TABS
0.5000 | ORAL_TABLET | Freq: Two times a day (BID) | ORAL | Status: DC
  Filled 2019-11-17 (×3): qty 0.5

## 2019-11-17 MED ORDER — ONDANSETRON HCL 4 MG PO TABS: 4.0000 mg | ORAL_TABLET | Freq: Four times a day (QID) | ORAL | Status: DC | PRN

## 2019-11-17 MED ORDER — ATORVASTATIN CALCIUM 20 MG PO TABS
80.0000 mg | ORAL_TABLET | Freq: Every day | ORAL | Status: DC
  Administered 2019-11-18: 80 mg via ORAL
  Filled 2019-11-17: qty 4

## 2019-11-17 MED ORDER — INSULIN ASPART 100 UNIT/ML ~~LOC~~ SOLN
0.0000 [IU] | Freq: Three times a day (TID) | SUBCUTANEOUS | Status: DC
  Administered 2019-11-18: 2 [IU] via SUBCUTANEOUS
  Administered 2019-11-18: 1 [IU] via SUBCUTANEOUS
  Administered 2019-11-18: 5 [IU] via SUBCUTANEOUS
  Filled 2019-11-17 (×3): qty 1

## 2019-11-17 MED ORDER — FUROSEMIDE 10 MG/ML IJ SOLN
40.0000 mg | Freq: Once | INTRAMUSCULAR | Status: DC
  Filled 2019-11-17: qty 4

## 2019-11-17 MED ORDER — HYDROCOD POLST-CPM POLST ER 10-8 MG/5ML PO SUER
5.0000 mL | Freq: Two times a day (BID) | ORAL | Status: DC | PRN
  Administered 2019-11-18: 5 mL via ORAL
  Filled 2019-11-17: qty 5

## 2019-11-17 MED ORDER — SODIUM CHLORIDE 0.9 % IV SOLN
2.0000 g | Freq: Once | INTRAVENOUS | Status: AC
  Administered 2019-11-17: 2 g via INTRAVENOUS
  Filled 2019-11-17: qty 2

## 2019-11-17 MED ORDER — INSULIN ASPART 100 UNIT/ML ~~LOC~~ SOLN
0.0000 [IU] | Freq: Every day | SUBCUTANEOUS | Status: DC
  Administered 2019-11-18: 2 [IU] via SUBCUTANEOUS
  Filled 2019-11-17: qty 1

## 2019-11-17 MED ORDER — TORSEMIDE 20 MG PO TABS
80.0000 mg | ORAL_TABLET | Freq: Two times a day (BID) | ORAL | Status: DC
  Administered 2019-11-18 (×2): 80 mg via ORAL
  Filled 2019-11-17 (×4): qty 4

## 2019-11-17 NOTE — Consult Note (Signed)
PHARMACY -  BRIEF ANTIBIOTIC NOTE   Pharmacy has received consult(s) for Cefepime from an ED provider.  The patient's profile has been reviewed for ht/wt/allergies/indication/available labs.    One time order(s) placed for Cefepime 2g x1 dose  Further antibiotics/pharmacy consults should be ordered by admitting physician if indicated.                       Thank you, Rowland Lathe 11/17/2019  3:43 PM

## 2019-11-17 NOTE — ED Notes (Signed)
Report received from Shannon RN. Patient care assumed. Patient/RN introduction complete. Will continue to monitor.  

## 2019-11-17 NOTE — ED Provider Notes (Signed)
[facility] Emergency Department Provider Note    First MD Initiated Contact with Patient 11/17/19 1517     (approximate)  I have reviewed the triage vital signs and the nursing notes.   HISTORY  Chief Complaint Shortness of Breath    HPI Gary Gutierrez is a 68 y.o. male below listed history as well as a history of COPD not on home oxygen presents the ER for shortness of breath fever chills generalized malaise.  No recent antibiotics or admissions.  No nausea or vomiting.  Denies any chest pain.  Does have exertional dyspnea and fatigue.  Does not have any known sick contacts.  Has not been recently tested for COVID-19.   Past Medical History:  Diagnosis Date  . Allergy   . CHF (congestive heart failure) (HCC)   . Coronary artery disease   . Diabetes mellitus without complication (HCC)   . Hyperlipidemia   . Hypertension    Family History  Problem Relation Age of Onset  . Heart disease Father   . Lung cancer Brother   . Prostate cancer Brother   . Diabetes Mother   . Heart disease Mother   . Heart attack Mother   . Congestive Heart Failure Mother    Past Surgical History:  Procedure Laterality Date  . APPENDECTOMY    . colonscopy  2010   benign polyps  . CORONARY ARTERY BYPASS GRAFT  1995  . CORONARY STENT INTERVENTION N/A 02/03/2017   Procedure: Coronary Stent Intervention;  Surgeon: Alwyn Pea, MD;  Location: [facility] INVASIVE CV LAB;  Service: Cardiovascular;  Laterality: N/A;  . ESOPHAGOGASTRODUODENOSCOPY  2010   normal  . LEFT HEART CATH AND CORONARY ANGIOGRAPHY N/A 02/03/2017   Procedure: Left Heart Cath and Coronary Angiography;  Surgeon: Alwyn Pea, MD;  Location: [facility] INVASIVE CV LAB;  Service: Cardiovascular;  Laterality: N/A;  . PERCUTANEOUS CORONARY STENT INTERVENTION (PCI-S)  2010, 2015   4 stents total   Patient Active Problem List   Diagnosis Date Noted  . Type II diabetes mellitus with complication (HCC)  10/27/2018  . Chronic obstructive pulmonary disease (HCC) 02/11/2018  . CHF (congestive heart failure) (HCC) 10/29/2017  . Polyneuropathy due to type 2 diabetes mellitus (HCC) 07/01/2017  . DJD (degenerative joint disease), lumbosacral 07/01/2017  . PAD (peripheral artery disease) (HCC) 06/30/2017  . Tobacco use disorder, mild, in sustained remission, abuse 04/10/2017  . Hx of colonic polyps 04/10/2016  . Hearing loss of both ears 01/03/2016  . Hyperlipidemia associated with type 2 diabetes mellitus (HCC) 06/09/2015  . Essential hypertension 06/09/2015  . Multilevel degenerative disc disease 06/09/2015  . Coronary artery disease involving native coronary artery of native heart with angina pectoris (HCC) 05/09/2014      Prior to Admission medications   Medication Sig Start Date End Date Taking? Authorizing Provider  ACCU-CHEK AVIVA PLUS test strip TEST BLOOD SUGAR EVERY DAY AS DIRECTED 10/19/19   [name], MD  allopurinol (ZYLOPRIM) 300 MG tablet TAKE 1 TABLET BY MOUTH EVERY DAY 08/09/19   [name], MD  aspirin EC 81 MG tablet Take 81 mg by mouth daily. Reported on 04/10/2016    [provider]  atorvastatin (LIPITOR) 80 MG tablet TAKE 1 TABLET(80 MG) BY MOUTH AT BEDTIME 07/21/19   [name], MD  carvedilol (COREG) 12.5 MG tablet TAKE 1 TABLET(12.5 MG) BY MOUTH TWICE DAILY WITH A MEAL Patient taking differently: Take 6.5 mg by mouth 2 (two) times daily  with a meal.  06/02/19   Reubin MilanBerglund, [name], MD  clopidogrel (PLAVIX) 75 MG tablet TAKE 1 TABLET(75 MG) BY MOUTH DAILY 08/09/19   Reubin MilanBerglund, [name], MD  glipiZIDE (GLUCOTROL XL) 10 MG 24 hr tablet TAKE 1 TABLET(10 MG) BY MOUTH DAILY 05/11/19   Reubin MilanBerglund, [name], MD  ibuprofen (ADVIL) 800 MG tablet TAKE 1 TABLET(800 MG) BY MOUTH EVERY 8 HOURS AS NEEDED 10/18/19   Reubin MilanBerglund, [name], MD  Insulin Glargine (LANTUS SOLOSTAR) 100 UNIT/ML Solostar Pen Inject 50 Units into the skin daily. 08/14/19   Reubin MilanBerglund, [name], MD   Insulin Pen Needle (PEN NEEDLES 31GX5/16") 31G X 8 MM MISC 1 each by Does not apply route daily. 06/17/18   Reubin MilanBerglund, [name], MD  isosorbide mononitrate (IMDUR) 30 MG 24 hr tablet Take 1 tablet (30 mg total) by mouth daily. 02/13/17 10/18/19  [name], [name], MD  JARDIANCE 25 MG TABS tablet TAKE 1 TABLET BY MOUTH DAILY 10/18/18   Reubin MilanBerglund, [name], MD  KOMBIGLYZE XR 2.04-999 MG TB24 TAKE 2 TABLETS BY MOUTH DAILY 10/23/19   Reubin MilanBerglund, [name], MD  nitroGLYCERIN (NITROSTAT) 0.4 MG SL tablet Place 1 tablet (0.4 mg total) under the tongue every 5 (five) minutes as needed for chest pain. 10/18/19   Reubin MilanBerglund, [name], MD  pantoprazole (PROTONIX) 40 MG tablet TAKE 1 TABLET(40 MG) BY MOUTH TWICE DAILY 06/10/19   Reubin MilanBerglund, [name], MD  potassium chloride SA (K-DUR,KLOR-CON) 20 MEQ tablet TAKE 2 TABLETS(40 MEQ) BY MOUTH DAILY 11/01/18   Clarisa KindredHackney, Tina A, FNP  torsemide (DEMADEX) 20 MG tablet TAKE 2 TABLETS(40 MG) BY MOUTH TWICE DAILY Patient taking differently: Take 80 mg by mouth 2 (two) times daily.  04/02/19   Clarisa KindredHackney, Tina A, FNP  umeclidinium-vilanterol (ANORO ELLIPTA) 62.5-25 MCG/INH AEPB Inhale 1 puff into the lungs daily. 04/22/19   Reubin MilanBerglund, [name], MD  ibuprofen (ADVIL,MOTRIN) 800 MG tablet Take 1 tablet (800 mg total) by mouth every 8 (eight) hours as needed. 04/13/19   Reubin MilanBerglund, [name], MD    Allergies Sherryll BurgerEntresto [sacubitril-valsartan] and Penicillins    Social History Social History   Tobacco Use  . Smoking status: Former Smoker    Packs/day: 1.50    Years: 29.00    Pack years: 43.50    Types: Cigarettes    Quit date: 12/30/1994    Years since quitting: 24.8  . Smokeless tobacco: Never Used  . Tobacco comment: smoking cessation materials not required  Substance Use Topics  . Alcohol use: No    Alcohol/week: 0.0 standard drinks  . Drug use: No    Review of Systems Patient denies headaches, rhinorrhea, blurry vision, numbness, shortness of breath, chest pain, edema, cough, abdominal  pain, nausea, vomiting, diarrhea, dysuria, fevers, rashes or hallucinations unless otherwise stated above in HPI. ____________________________________________   PHYSICAL EXAM:  VITAL SIGNS: Vitals:   11/17/19 1138 11/17/19 1400  BP: 104/66   Pulse: (!) 106   Resp: (!) 22   Temp: (!) 102.3 F (39.1 C) 100.3 F (37.9 C)  SpO2: 93%     Constitutional: Alert and oriented.  Eyes: Conjunctivae are normal.  Head: Atraumatic. Nose: No congestion/rhinnorhea. Mouth/Throat: Mucous membranes are moist.   Neck: No stridor. Painless ROM.  Cardiovascular: Normal rate, regular rhythm. Grossly normal heart sounds.  Good peripheral circulation. Respiratory: mild tachypnea with rhonchi in RL fields Gastrointestinal: Soft and nontender. No distention. No abdominal bruits. No CVA tenderness. Genitourinary:  Musculoskeletal: No lower extremity tenderness nor edema.  No  joint effusions. Neurologic:  Normal speech and language. No gross focal neurologic deficits are appreciated. No facial droop Skin:  Skin is warm, dry and intact. No rash noted. Psychiatric: Mood and affect are normal. Speech and behavior are normal.  ____________________________________________   LABS (all labs ordered are listed, but only abnormal results are displayed)  Results for orders placed or performed during the hospital encounter of 11/17/19 (from the past 24 hour(s))  Comprehensive metabolic panel     Status: Abnormal   Collection Time: 11/17/19 11:51 AM  Result Value Ref Range   Sodium 135 135 - 145 mmol/L   Potassium 3.5 3.5 - 5.1 mmol/L   Chloride 85 (L) 98 - 111 mmol/L   CO2 28 22 - 32 mmol/L   Glucose, Bld 182 (H) 70 - 99 mg/dL   BUN 36 (H) 8 - 23 mg/dL   Creatinine, Ser 1.61 (H) 0.61 - 1.24 mg/dL   Calcium 8.4 (L) 8.9 - 10.3 mg/dL   Total Protein 7.9 6.5 - 8.1 g/dL   Albumin 3.8 3.5 - 5.0 g/dL   AST 28 15 - 41 U/L   ALT 19 0 - 44 U/L   Alkaline Phosphatase 93 38 - 126 U/L   Total Bilirubin 1.0 0.3 -  1.2 mg/dL   GFR calc non Af Amer 44 (L) >60 mL/min   GFR calc Af Amer 51 (L) >60 mL/min   Anion gap 22 (H) 5 - 15  Lactic acid, plasma     Status: None   Collection Time: 11/17/19 11:51 AM  Result Value Ref Range   Lactic Acid, Venous 1.9 0.5 - 1.9 mmol/L  CBC with Differential     Status: Abnormal   Collection Time: 11/17/19 11:51 AM  Result Value Ref Range   WBC 6.0 4.0 - 10.5 K/uL   RBC 5.02 4.22 - 5.81 MIL/uL   Hemoglobin 15.7 13.0 - 17.0 g/dL   HCT 09.6 04.5 - 40.9 %   MCV 91.2 80.0 - 100.0 fL   MCH 31.3 26.0 - 34.0 pg   MCHC 34.3 30.0 - 36.0 g/dL   RDW 81.1 91.4 - 78.2 %   Platelets 140 (L) 150 - 400 K/uL   nRBC 0.0 0.0 - 0.2 %   Neutrophils Relative % 85 %   Neutro Abs 5.1 1.7 - 7.7 K/uL   Lymphocytes Relative 9 %   Lymphs Abs 0.5 (L) 0.7 - 4.0 K/uL   Monocytes Relative 5 %   Monocytes Absolute 0.3 0.1 - 1.0 K/uL   Eosinophils Relative 0 %   Eosinophils Absolute 0.0 0.0 - 0.5 K/uL   Basophils Relative 0 %   Basophils Absolute 0.0 0.0 - 0.1 K/uL   Immature Granulocytes 1 %   Abs Immature Granulocytes 0.03 0.00 - 0.07 K/uL  Urinalysis, Complete w Microscopic     Status: Abnormal   Collection Time: 11/17/19 11:51 AM  Result Value Ref Range   Color, Urine STRAW (A) YELLOW   APPearance CLEAR (A) CLEAR   Specific Gravity, Urine 1.007 1.005 - 1.030   pH 6.0 5.0 - 8.0   Glucose, UA >=500 (A) NEGATIVE mg/dL   Hgb urine dipstick SMALL (A) NEGATIVE   Bilirubin Urine NEGATIVE NEGATIVE   Ketones, ur NEGATIVE NEGATIVE mg/dL   Protein, ur NEGATIVE NEGATIVE mg/dL   Nitrite NEGATIVE NEGATIVE   Leukocytes,Ua NEGATIVE NEGATIVE   RBC / HPF 0-5 0 - 5 RBC/hpf   WBC, UA 0-5 0 - 5 WBC/hpf   Bacteria, UA NONE SEEN  NONE SEEN   Squamous Epithelial / LPF NONE SEEN 0 - 5  Troponin I (High Sensitivity)     Status: Abnormal   Collection Time: 11/17/19 11:51 AM  Result Value Ref Range   Troponin I (High Sensitivity) 84 (H) <18 ng/L  Protime-INR     Status: None   Collection Time:  11/17/19 12:23 PM  Result Value Ref Range   Prothrombin Time 13.4 11.4 - 15.2 seconds   INR 1.0 0.8 - 1.2  Lactic acid, plasma     Status: None   Collection Time: 11/17/19  1:55 PM  Result Value Ref Range   Lactic Acid, Venous 1.8 0.5 - 1.9 mmol/L  Troponin I (High Sensitivity)     Status: Abnormal   Collection Time: 11/17/19  1:55 PM  Result Value Ref Range   Troponin I (High Sensitivity) 139 (HH) <18 ng/L   ____________________________________________  EKG My review and personal interpretation at Time: 11:39   Indication: sob  Rate: 110  Rhythm: sinus Axis: normal Other: poor r wave progression, nonspecific st abn ____________________________________________  RADIOLOGY  I personally reviewed all radiographic images ordered to evaluate for the above acute complaints and reviewed radiology reports and findings.  These findings were personally discussed with the patient.  Please see medical record for radiology report.  ____________________________________________   PROCEDURES  Procedure(s) performed:  .Critical Care Performed by: Merlyn Lot, MD Authorized by: Merlyn Lot, MD   Critical care provider statement:    Critical care time (minutes):  30   Critical care time was exclusive of:  Separately billable procedures and treating other patients   Critical care was necessary to treat or prevent imminent or life-threatening deterioration of the following conditions:  Sepsis   Critical care was time spent personally by me on the following activities:  Development of treatment plan with patient or surrogate, discussions with consultants, evaluation of patient's response to treatment, examination of patient, obtaining history from patient or surrogate, ordering and performing treatments and interventions, ordering and review of laboratory studies, ordering and review of radiographic studies, pulse oximetry, re-evaluation of patient's condition and review of old charts       Critical Care performed: yes ____________________________________________   INITIAL IMPRESSION / [place] / ED COURSE  Pertinent labs & imaging results that were available during my care of the patient were reviewed by me and considered in my medical decision making (see chart for details).   DDX: Pneumonia, COPD, CHF, PE, ACS, COVID-19  Gary Gutierrez is a 68 y.o. who presents to the ED with symptoms as described above.  Patient currently protecting his airway.  Arrives febrile mildly tachycardic with tachypnea.  Work-up shows evidence of patchy pneumonia.  Troponin is rising which I suspect is secondary to demand ischemia.  Will order broad-spectrum antibiotics.  Will test for COVID-19.  Will give nebulizer. Will heparinize. Does not appear to be in volume overload.  Will check lactate.  Blood pressure is stable.  Given his history of heart failure will withhold aggressive resuscitation at this time until we have lactate and will bit more time to identify whether he requires IV fluids.   Clinical Course as of Nov 16 1549  Wed Nov 17, 2019  1548 Lactate is normal.  Given pulmonary vasculature seen on x-ray with stable blood pressure I think wise to give a trial of Lasix to see if that improves respiratory status.   [PR]    Clinical Course User Index [PR] Merlyn Lot, MD  The patient was evaluated in Emergency Department today for the symptoms described in the history of present illness. He/she was evaluated in the context of the global COVID-19 pandemic, which necessitated consideration that the patient might be at risk for infection with the SARS-CoV-2 virus that causes COVID-19. Institutional protocols and algorithms that pertain to the evaluation of patients at risk for COVID-19 are in a state of rapid change based on information released by regulatory bodies including the CDC and federal and state organizations. These policies and algorithms were followed  during the patient's care in the ED.  As part of my medical decision making, I reviewed the following data within the electronic MEDICAL RECORD NUMBER Nursing notes reviewed and incorporated, Labs reviewed, notes from prior ED visits and [place] Controlled Substance Database   ____________________________________________   FINAL CLINICAL IMPRESSION(S) / ED DIAGNOSES  Final diagnoses:  Sepsis with acute respiratory failure without septic shock, due to unspecified organism, unspecified whether hypoxia or hypercapnia present (HCC)      NEW MEDICATIONS STARTED DURING THIS VISIT:  New Prescriptions   No medications on file     Note:  This document was prepared using Dragon voice recognition software and may include unintentional dictation errors.    Willy Eddy, MD 11/17/19 1550

## 2019-11-17 NOTE — ED Triage Notes (Signed)
Pt comes from home with sob, fever, cough. Not a smoker. Pt also c/o lower back pain.

## 2019-11-17 NOTE — ED Notes (Signed)
Heparin drip started with Larene Beach RN

## 2019-11-17 NOTE — Consult Note (Signed)
ANTICOAGULATION CONSULT NOTE - Follow Up Consult  Pharmacy Consult for Heparin  Indication: ACS/STEMI  Allergies  Allergen Reactions  . Entresto [Sacubitril-Valsartan] Other (See Comments)    Low blood pressure  . Penicillins Other (See Comments)    Other reaction(s): Unknown, pt does not know reactions    Patient Measurements: Height: 5\' 9"  (175.3 cm) Weight: 183 lb (83 kg) IBW/kg (Calculated) : 70.7 Heparin Dosing Weight: 83 kg   Vital Signs: Temp: 100.3 F (37.9 C) (11/18 1400) Temp Source: Oral (11/18 1400) BP: 104/66 (11/18 1138) Pulse Rate: 106 (11/18 1138)  Labs: Recent Labs    11/17/19 1151 11/17/19 1223 11/17/19 1355  HGB 15.7  --   --   HCT 45.8  --   --   PLT 140*  --   --   LABPROT  --  13.4  --   INR  --  1.0  --   CREATININE 1.57*  --   --   TROPONINIHS 84*  --  139*    Estimated Creatinine Clearance: 44.4 mL/min (A) (by C-G formula based on SCr of 1.57 mg/dL (H)).   Medications:  No PTA anticoagulants per [facility] and Care Everywhere. Patient was unable to confirm whether he is on anticoagulants but reported being on Plavix and Aspirin.  Last dose of antiplatelets were this morning.  Assessment: Pharmacy has been consulted to dose heparin for ACS/STEMI. Patient presented with shortness of breath, fever, chills, generalized malaise, and denies chest pain. Patient is currently being treated for CAP with Cefepime. Patient does have a PMH significant for COPD and CHF.   Given the patient has likely only taken antiplatelets, will start heparin with bolus.   Troponin 84 > 139  Goal of Therapy:  Heparin level 0.3-0.7 units/ml Monitor platelets by anticoagulation protocol: Yes   Plan:  Baseline labs have been ordered: aPTT  Heparin DW: 83 kg  Give 4000 units bolus x 1 Start heparin infusion at 950 units/hr Check anti-Xa level in 6 hours and daily while on heparin, per protocol Continue to monitor H&H and platelets   Jaimya Feliciano R  Paula Zietz 11/17/2019,3:43 PM

## 2019-11-17 NOTE — ED Notes (Signed)
Pt found in room standing up with Nasal cannula off, SaO2 mid 80's. Pt sat back on bed and placed back on 2L [place]. SaO2 95%

## 2019-11-17 NOTE — H&P (Addendum)
[facility] History and Physical   Patient: Gary MarionJames W Nikolov ZOX:[id number]   PCP: Reubin MilanBerglund, [name], MD DOB: 1951-10-15   DOA: 11/17/2019   DOS: 11/17/2019   DOS: the patient was seen and examined on 11/17/2019  Patient coming from: The patient is coming from Home  Chief Complaint: Shortness of breath and cough  HPI: Gary Gutierrez is a 68 y.o. male with Past medical history of chronic systolic CHF, CAD, type II DM, HLD, HTN. Patient presents with complaints of cough fever chills as well as shortness of breath. This is ongoing for last 2 days. Denies any chest pain or chest tightness. Shortness of breath is not described as orthopnea or PND but it is primarily just shortness of breath on exertion. Cough is productive with yellowish expectoration without any blood. No nausea no vomiting.  No diarrhea.  No recent change in medication.  No exposure no travel.  ED Course: Presents with above complaint.  Found to be febrile with tachycardia and tachypnea.  Chest x-ray shows evidence of bilateral patchy infiltrate.  Patient referred for admission for community-acquired pneumonia.  Currently concern for PUI for Covid as well.  At his baseline ambulates without assistance independent for most of his ADL;  manages his medication on his own.  Review of Systems: as mentioned in the history of present illness.  All other systems reviewed and are negative.  Past Medical History:  Diagnosis Date  . Allergy   . CHF (congestive heart failure) (HCC)   . Coronary artery disease   . Diabetes mellitus without complication (HCC)   . Hyperlipidemia   . Hypertension    Past Surgical History:  Procedure Laterality Date  . APPENDECTOMY    . colonscopy  2010   benign polyps  . CORONARY ARTERY BYPASS GRAFT  1995  . CORONARY STENT INTERVENTION N/A 02/03/2017   Procedure: Coronary Stent Intervention;  Surgeon: Alwyn Peawayne D Callwood, MD;  Location: [facility] IN[facility]IVE [facility];  Service: Cardiovascular;   Laterality: N/A;  . ESOPHAGOGASTRODUODENOSCOPY  2010   normal  . LEFT HEART CATH AND CORONARY ANGIOGRAPHY N/A 02/03/2017   Procedure: Left Heart Cath and Coronary Angiography;  Surgeon: Alwyn Peawayne D Callwood, MD;  Location: [facility] CV [facility];  Service: Cardiovascular;  Laterality: N/A;  . PERCUTANEOUS CORONARY STENT INTERVENTION (PCI-S)  2010, 2015   4 stents total   Social History:  reports that he quit smoking about 24 years ago. His smoking use included cigarettes. He has a 43.50 pack-year smoking history. He has never used smokeless tobacco. He reports that he does not drink alcohol or use drugs.  Allergies  Allergen Reactions  . Entresto [Sacubitril-Valsartan] Other (See Comments)    Low blood pressure  . Penicillins Other (See Comments)    Other reaction(s): Unknown, pt does not know reactions   Family history reviewed and not pertinent Family History  Problem Relation Age of Onset  . Heart disease Father   . Lung cancer Brother   . Prostate cancer Brother   . Diabetes Mother   . Heart disease Mother   . Heart attack Mother   . Congestive Heart Failure Mother      Prior to Admission medications   Medication Sig Start Date End Date Taking? Authorizing Provider  ACCU-CHEK AVIVA PLUS test strip TEST BLOOD SUGAR EVERY DAY AS DIRECTED Patient taking differently: 1 each by Other route as directed.  10/19/19  Yes Reubin MilanBerglund, [name], MD  albuterol (VENTOLIN HFA) 108 (90 Base) MCG/ACT inhaler Inhale 2  puffs into the lungs every 6 (six) hours as needed for wheezing or shortness of breath.   Yes [provider]  allopurinol (ZYLOPRIM) 300 MG tablet TAKE 1 TABLET BY MOUTH EVERY DAY Patient taking differently: Take 300 mg by mouth daily.  08/09/19  Yes [name], MD  aspirin EC 81 MG tablet Take 81 mg by mouth daily.    Yes [provider]  atorvastatin (LIPITOR) 80 MG tablet TAKE 1 TABLET(80 MG) BY MOUTH AT BEDTIME Patient taking differently: Take 80 mg by mouth  at bedtime.  07/21/19  Yes [name], MD  cetirizine (ZYRTEC) 10 MG tablet Take 10 mg by mouth daily.   Yes [provider]  clopidogrel (PLAVIX) 75 MG tablet TAKE 1 TABLET(75 MG) BY MOUTH DAILY Patient taking differently: Take 75 mg by mouth daily.  08/09/19  Yes [name], MD  glipiZIDE (GLUCOTROL XL) 10 MG 24 hr tablet TAKE 1 TABLET(10 MG) BY MOUTH DAILY Patient taking differently: Take 10 mg by mouth daily.  05/11/19  Yes [name], MD  ibuprofen (ADVIL) 800 MG tablet TAKE 1 TABLET(800 MG) BY MOUTH EVERY 8 HOURS AS NEEDED Patient taking differently: Take 800 mg by mouth every 8 (eight) hours as needed for mild pain or moderate pain. TAKE 1 TABLET(800 MG) BY MOUTH EVERY 8 HOURS AS NEEDED 10/18/19  Yes [name], MD  Insulin Glargine (LANTUS SOLOSTAR) 100 UNIT/ML Solostar Pen Inject 50 Units into the skin daily. 08/14/19  Yes [name], MD  Insulin Pen Needle (PEN NEEDLES 31GX5/16") 31G X 8 MM MISC 1 each by Does not apply route daily. 06/17/18  Yes [name], MD  isosorbide mononitrate (IMDUR) 30 MG 24 hr tablet Take 1 tablet (30 mg total) by mouth daily. 02/13/17 11/17/19 Yes Jennye Moccasin, MD  JARDIANCE 25 MG TABS tablet TAKE 1 TABLET BY MOUTH DAILY Patient taking differently: Take 25 mg by mouth daily.  10/18/18  Yes [name], MD  KOMBIGLYZE XR 2.04-999 MG TB24 TAKE 2 TABLETS BY MOUTH DAILY Patient taking differently: Take 2 tablets by mouth daily.  10/23/19  Yes [name], MD  nitroGLYCERIN (NITROSTAT) 0.4 MG SL tablet Place 1 tablet (0.4 mg total) under the tongue every 5 (five) minutes as needed for chest pain. 10/18/19  Yes [name], MD  pantoprazole (PROTONIX) 40 MG tablet TAKE 1 TABLET(40 MG) BY MOUTH TWICE DAILY Patient taking differently: Take 40 mg by mouth daily.  06/10/19  Yes [name], MD  potassium chloride SA (K-DUR,KLOR-CON) 20 MEQ tablet TAKE 2 TABLETS(40 MEQ) BY MOUTH DAILY Patient taking  differently: Take 40 mEq by mouth daily.  11/01/18  Yes Hackney, Tina A, FNP  sacubitril-valsartan (ENTRESTO) 24-26 MG Take 0.5 tablets by mouth 2 (two) times daily.   Yes [provider]  torsemide (DEMADEX) 20 MG tablet TAKE 2 TABLETS(40 MG) BY MOUTH TWICE DAILY Patient taking differently: Take 80 mg by mouth 2 (two) times daily.  04/02/19  Yes Hackney, Tina A, FNP  umeclidinium-vilanterol (ANORO ELLIPTA) 62.5-25 MCG/INH AEPB Inhale 1 puff into the lungs daily. Patient not taking: Reported on 11/17/2019 04/22/19   [name], MD  ibuprofen (ADVIL,MOTRIN) 800 MG tablet Take 1 tablet (800 mg total) by mouth every 8 (eight) hours as needed. 04/13/19   [name], MD    Physical Exam: Vitals:   11/17/19 1530 11/17/19 1552 11/17/19 1600 11/17/19 1645  BP: 97/62  95/61 98/71  Pulse: 91  84 87  Resp: (!) 22  (!) 24 (!) 25  Temp:  99.4 F (37.4 C)    TempSrc:  Oral    SpO2: 98%  99% 95%  Weight:      Height:        General: alert and oriented to time, place, and person. Appear in mild distress, affect appropriate Eyes: PERRL, Conjunctiva normal ENT: Oral Mucosa Clear, moist  Neck: no JVD, no Abnormal Mass Or lumps Cardiovascular: S1 and S2 Present, no Murmur, peripheral pulses symmetrical Respiratory: increased respiratory effort, Bilateral Air entry equal and Decreased, no signs of accessory muscle use, bilateral Crackles, no wheezes Abdomen: Bowel Sound present, Soft and no tenderness, no hernia Skin: no rashes  Extremities: no Pedal edema, no calf tenderness Neurologic: without any new focal findings Gait not checked due to patient safety concerns  Data Reviewed: I have personally reviewed and interpreted labs, imaging as discussed below.  CBC: Recent Labs  Lab 11/17/19 1151  WBC 6.0  NEUTROABS 5.1  HGB 15.7  HCT 45.8  MCV 91.2  PLT 140*   Basic Metabolic Panel: Recent Labs  Lab 11/17/19 1151  NA 135  K 3.5  CL 85*  CO2 28  GLUCOSE 182*  BUN  36*  CREATININE 1.57*  CALCIUM 8.4*   GFR: Estimated Creatinine Clearance: 44.4 mL/min (A) (by C-G formula based on SCr of 1.57 mg/dL (H)). Liver Function Tests: Recent Labs  Lab 11/17/19 1151  AST 28  ALT 19  ALKPHOS 93  BILITOT 1.0  PROT 7.9  ALBUMIN 3.8   No results for input(s): LIPASE, AMYLASE in the last 168 hours. No results for input(s): AMMONIA in the last 168 hours. Coagulation Profile: Recent Labs  Lab 11/17/19 1223  INR 1.0   Cardiac Enzymes: No results for input(s): CKTOTAL, CKMB, CKMBINDEX, TROPONINI in the last 168 hours. BNP (last 3 results) No results for input(s): PROBNP in the last 8760 hours. HbA1C: No results for input(s): HGBA1C in the last 72 hours. CBG: No results for input(s): GLUCAP in the last 168 hours. Lipid Profile: No results for input(s): CHOL, HDL, LDLCALC, TRIG, CHOLHDL, LDLDIRECT in the last 72 hours. Thyroid Function Tests: No results for input(s): TSH, T4TOTAL, FREET4, T3FREE, THYROIDAB in the last 72 hours. Anemia Panel: Recent Labs    11/17/19 1539  FERRITIN 262   Urine analysis:    Component Value Date/Time   COLORURINE STRAW (A) 11/17/2019 1151   APPEARANCEUR CLEAR (A) 11/17/2019 1151   APPEARANCEUR CLEAR 07/09/2012 1250   LABSPEC 1.007 11/17/2019 1151   LABSPEC 1.025 07/09/2012 1250   PHURINE 6.0 11/17/2019 1151   GLUCOSEU >=500 (A) 11/17/2019 1151   GLUCOSEU NEGATIVE 07/09/2012 1250   HGBUR SMALL (A) 11/17/2019 1151   BILIRUBINUR NEGATIVE 11/17/2019 1151   BILIRUBINUR neg 06/10/2017 0900   BILIRUBINUR NEGATIVE 07/09/2012 1250   KETONESUR NEGATIVE 11/17/2019 1151   PROTEINUR NEGATIVE 11/17/2019 1151   UROBILINOGEN 0.2 06/10/2017 0900   NITRITE NEGATIVE 11/17/2019 1151   LEUKOCYTESUR NEGATIVE 11/17/2019 1151   LEUKOCYTESUR NEGATIVE 07/09/2012 1250    Radiological Exams on Admission: Dg Chest 2 View  Result Date: 11/17/2019 CLINICAL DATA:  Suspected sepsis. Additional history provided: Shortness of breath,  fever, cough. EXAM: CHEST - 2 VIEW COMPARISON:  Chest radiograph 06/05/2019 FINDINGS: Sequela of prior median sternotomy. The heart is at the upper limits of normal for size. Pulmonary vascular congestion. Superimposed upon chronic interstitial prominence, there is patchy opacity within the mid to lower right lung  field suspicious for pneumonia. No evidence of pneumothorax. No sizable pleural effusion. No acute bony abnormality. IMPRESSION: Patchy airspace opacities within the mid to lower right lung field suspicious for pneumonia. Pulmonary vascular congestion. Redemonstrated chronic interstitial prominence. Electronically Signed   By: Kellie Simmering DO   On: 11/17/2019 12:23   EKG: Independently reviewed. normal sinus rhythm, nonspecific ST and T waves changes. Echocardiogram: Ordered on 11/17/2019.  I reviewed all nursing notes, pharmacy notes, vitals, pertinent old records.  Assessment/Plan 1.  Community-acquired pneumonia Sepsis POA Elevated procalcitonin level.  Tachycardia.  Fever and leukocytosis on admission. We will treat with IV ceftriaxone and azithromycin. Follow-up on cultures.  Currently holding off on the afternoon dose of the diuretic.  Not providing any IV fluids.  2.  Concern for COVID-19 infection. We will maintain the patient as a PUI for now. If the test comes back positive will initiate the patient with the full treatment for remdesivir and steroids.  3.  chronic combined systolic and diastolic CHF. Initial concern was a patient presents with complaints of shortness of breath and may have acute on chronic combined systolic and diastolic CHF. Currently appears euvolemic at the clinical examination. Appears to have a respiratory illness infection rather than CHF exacerbation. We will continue with home regimen.  3.  Type 2 diabetes mellitus.  Uncontrolled with hyperglycemia.  With chronic renal complication. Continuing home insulin Lantus. Holding home oral hypoglycemic  agents. Check hemoglobin A1c.  4.  Hyperlipidemia. Continue Lipitor.  5.  Chronic kidney disease stage IIIa renal function stable. Continue home regimen for now.  6. Elevated troponin Elevated d dimer Due to h/o CAD and CHF, and presentation with Shortness of breath troponin were checked  Elevated levels seen EDP started the pt on heparin Will continue the same Low suspicion for ACS for now  Nutrition: Cardiac diet and Carb modified diet DVT Prophylaxis: Therapeutic Anticoagulation with Heparin  Advance goals of care discussion: Full code   Consults: none  Family Communication: no family was present at bedside, at the time of interview.   Disposition: Admitted as inpatient, telemetry unit. Likely to be discharged home, in 3 days.  I have discussed plan of care as described above with RN and patient/family.  Author: Berle Mull, MD [facility] 11/17/2019 6:58 PM   To reach On-call, see care teams to locate the attending and reach out to them via [email or web address]. If 7PM-7AM, please contact night-coverage If you still have difficulty reaching the attending provider, please page the [facility] (Director on Call) for [facility] on amion for assistance.

## 2019-11-17 NOTE — ED Notes (Addendum)
Pt ambulated to restroom. Pt had 1 large BM that was of a watery consistency.

## 2019-11-18 ENCOUNTER — Inpatient Hospital Stay
Admit: 2019-11-18 | Discharge: 2019-11-18 | Disposition: A | Discharge status: Home / Self Care | Payer: Medicare Other | Attending: Internal Medicine | Admitting: Internal Medicine

## 2019-11-18 ENCOUNTER — Inpatient Hospital Stay: Payer: Medicare Other

## 2019-11-18 DIAGNOSIS — A4189 Other specified sepsis: Secondary | ICD-10-CM | POA: Diagnosis not present

## 2019-11-18 DIAGNOSIS — R6520 Severe sepsis without septic shock: Secondary | ICD-10-CM | POA: Diagnosis not present

## 2019-11-18 DIAGNOSIS — J9600 Acute respiratory failure, unspecified whether with hypoxia or hypercapnia: Secondary | ICD-10-CM | POA: Diagnosis not present

## 2019-11-18 DIAGNOSIS — J1289 Other viral pneumonia: Secondary | ICD-10-CM | POA: Diagnosis not present

## 2019-11-18 DIAGNOSIS — I5043 Acute on chronic combined systolic (congestive) and diastolic (congestive) heart failure: Secondary | ICD-10-CM | POA: Diagnosis not present

## 2019-11-18 DIAGNOSIS — A419 Sepsis, unspecified organism: Secondary | ICD-10-CM | POA: Diagnosis not present

## 2019-11-18 DIAGNOSIS — J440 Chronic obstructive pulmonary disease with acute lower respiratory infection: Secondary | ICD-10-CM | POA: Diagnosis not present

## 2019-11-18 DIAGNOSIS — U071 COVID-19: Secondary | ICD-10-CM | POA: Diagnosis not present

## 2019-11-18 DIAGNOSIS — R7989 Other specified abnormal findings of blood chemistry: Secondary | ICD-10-CM | POA: Diagnosis not present

## 2019-11-18 LAB — COMPREHENSIVE METABOLIC PANEL
ALT: 19 U/L (ref 0–44)
AST: 31 U/L (ref 15–41)
Albumin: 3 g/dL — ABNORMAL LOW (ref 3.5–5.0)
Alkaline Phosphatase: 66 U/L (ref 38–126)
Anion gap: 15 (ref 5–15)
BUN: 39 mg/dL — ABNORMAL HIGH (ref 8–23)
CO2: 28 mmol/L (ref 22–32)
Calcium: 7.6 mg/dL — ABNORMAL LOW (ref 8.9–10.3)
Chloride: 90 mmol/L — ABNORMAL LOW (ref 98–111)
Creatinine, Ser: 1.53 mg/dL — ABNORMAL HIGH (ref 0.61–1.24)
GFR calc Af Amer: 53 mL/min — ABNORMAL LOW (ref >60)
GFR calc non Af Amer: 46 mL/min — ABNORMAL LOW (ref >60)
Glucose, Bld: 123 mg/dL — ABNORMAL HIGH (ref 70–99)
Potassium: 3.4 mmol/L — ABNORMAL LOW (ref 3.5–5.1)
Sodium: 133 mmol/L — ABNORMAL LOW (ref 135–145)
Total Bilirubin: 1 mg/dL (ref 0.3–1.2)
Total Protein: 6.3 g/dL — ABNORMAL LOW (ref 6.5–8.1)

## 2019-11-18 LAB — HEMOGLOBIN A1C
Hgb A1c MFr Bld: 7 % — ABNORMAL HIGH (ref 4.8–5.6)
Mean Plasma Glucose: 154.2 mg/dL

## 2019-11-18 LAB — HIV ANTIBODY (ROUTINE TESTING W REFLEX): HIV Screen 4th Generation wRfx: NONREACTIVE

## 2019-11-18 LAB — CBC
HCT: 39.5 % (ref 39.0–52.0)
Hemoglobin: 13.2 g/dL (ref 13.0–17.0)
MCH: 30.7 pg (ref 26.0–34.0)
MCHC: 33.4 g/dL (ref 30.0–36.0)
MCV: 91.9 fL (ref 80.0–100.0)
Platelets: 121 10*3/uL — ABNORMAL LOW (ref 150–400)
RBC: 4.3 MIL/uL (ref 4.22–5.81)
RDW: 15.6 % — ABNORMAL HIGH (ref 11.5–15.5)
WBC: 6.1 10*3/uL (ref 4.0–10.5)
nRBC: 0 % (ref 0.0–0.2)

## 2019-11-18 LAB — EXPECTORATED SPUTUM ASSESSMENT W GRAM STAIN, RFLX TO RESP C

## 2019-11-18 LAB — TROPONIN I (HIGH SENSITIVITY)
Troponin I (High Sensitivity): 109 ng/L (ref <18)
Troponin I (High Sensitivity): 100 ng/L (ref <18)

## 2019-11-18 LAB — GLUCOSE, CAPILLARY
Glucose-Capillary: 113 mg/dL — ABNORMAL HIGH (ref 70–99)
Glucose-Capillary: 125 mg/dL — ABNORMAL HIGH (ref 70–99)
Glucose-Capillary: 186 mg/dL — ABNORMAL HIGH (ref 70–99)
Glucose-Capillary: 261 mg/dL — ABNORMAL HIGH (ref 70–99)
Glucose-Capillary: 201 mg/dL — ABNORMAL HIGH (ref 70–99)
Glucose-Capillary: 221 mg/dL — ABNORMAL HIGH (ref 70–99)

## 2019-11-18 LAB — ABO/RH: ABO/RH(D): O POS

## 2019-11-18 LAB — BRAIN NATRIURETIC PEPTIDE: B Natriuretic Peptide: 564 pg/mL — ABNORMAL HIGH (ref 0.0–100.0)

## 2019-11-18 LAB — FIBRIN DERIVATIVES D-DIMER (ARMC ONLY): Fibrin derivatives D-dimer (ARMC): 2141.96 ng/mL (FEU) — ABNORMAL HIGH (ref 0.00–499.00)

## 2019-11-18 LAB — C-REACTIVE PROTEIN: CRP: 13.6 mg/dL — ABNORMAL HIGH (ref <1.0)

## 2019-11-18 LAB — FERRITIN: Ferritin: 541 ng/mL — ABNORMAL HIGH (ref 24–336)

## 2019-11-18 LAB — SARS CORONAVIRUS 2 (TAT 6-24 HRS): SARS Coronavirus 2: POSITIVE — AB

## 2019-11-18 LAB — HEPARIN LEVEL (UNFRACTIONATED)
Heparin Unfractionated: 0.36 IU/mL (ref 0.30–0.70)
Heparin Unfractionated: 0.36 IU/mL (ref 0.30–0.70)

## 2019-11-18 MED ORDER — VITAMIN C 500 MG PO TABS
500.0000 mg | ORAL_TABLET | Freq: Every day | ORAL | Status: DC
  Administered 2019-11-18: 500 mg via ORAL
  Filled 2019-11-18 (×2): qty 1

## 2019-11-18 MED ORDER — LOPERAMIDE HCL 2 MG PO CAPS
2.0000 mg | ORAL_CAPSULE | Freq: Once | ORAL | Status: AC
  Administered 2019-11-18: 2 mg via ORAL
  Filled 2019-11-18: qty 1

## 2019-11-18 MED ORDER — TECHNETIUM TO 99M ALBUMIN AGGREGATED
4.8200 | Freq: Once | INTRAVENOUS | Status: AC | PRN
  Administered 2019-11-18: 4.82 via INTRAVENOUS

## 2019-11-18 MED ORDER — IPRATROPIUM-ALBUTEROL 20-100 MCG/ACT IN AERS: 1.0000 | INHALATION_SPRAY | Freq: Four times a day (QID) | RESPIRATORY_TRACT | Status: DC

## 2019-11-18 MED ORDER — DEXAMETHASONE SODIUM PHOSPHATE 10 MG/ML IJ SOLN
6.0000 mg | INTRAMUSCULAR | Status: DC
  Administered 2019-11-18: 6 mg via INTRAVENOUS
  Filled 2019-11-18: qty 1

## 2019-11-18 MED ORDER — BENZONATATE 100 MG PO CAPS: 100.0000 mg | ORAL_CAPSULE | Freq: Three times a day (TID) | ORAL | 0 refills | Status: DC

## 2019-11-18 MED ORDER — ASCORBIC ACID 500 MG PO TABS: 500.0000 mg | ORAL_TABLET | Freq: Every day | ORAL | Status: DC

## 2019-11-18 MED ORDER — DEXAMETHASONE SODIUM PHOSPHATE 10 MG/ML IJ SOLN: 6.0000 mg | INTRAMUSCULAR | 0 refills | Status: DC

## 2019-11-18 MED ORDER — SODIUM CHLORIDE 0.9 % IV SOLN
100.0000 mg | INTRAVENOUS | Status: DC
  Filled 2019-11-18: qty 20

## 2019-11-18 MED ORDER — ZINC SULFATE 220 (50 ZN) MG PO CAPS: 220.0000 mg | ORAL_CAPSULE | Freq: Every day | ORAL | Status: DC

## 2019-11-18 MED ORDER — IPRATROPIUM-ALBUTEROL 20-100 MCG/ACT IN AERS
1.0000 | INHALATION_SPRAY | Freq: Four times a day (QID) | RESPIRATORY_TRACT | Status: DC
  Administered 2019-11-18 – 2019-11-19 (×4): 1 via RESPIRATORY_TRACT
  Filled 2019-11-18: qty 4

## 2019-11-18 MED ORDER — SODIUM CHLORIDE 0.9 % IV SOLN
200.0000 mg | Freq: Once | INTRAVENOUS | Status: AC
  Administered 2019-11-18: 200 mg via INTRAVENOUS
  Filled 2019-11-18: qty 40

## 2019-11-18 MED ORDER — ZINC SULFATE 220 (50 ZN) MG PO CAPS
220.0000 mg | ORAL_CAPSULE | Freq: Every day | ORAL | Status: DC
  Administered 2019-11-18: 220 mg via ORAL
  Filled 2019-11-18 (×2): qty 1

## 2019-11-18 NOTE — ED Notes (Signed)
Patient assisted to bedside toilet at this time.

## 2019-11-18 NOTE — ED Notes (Signed)
Pt up to the bathroom. 1 medium BM, of watery consistency.

## 2019-11-18 NOTE — Progress Notes (Signed)
*  PRELIMINARY RESULTS* Echocardiogram 2D Echocardiogram has been performed.  Sherrie Sport 11/18/2019, 1:49 PM

## 2019-11-18 NOTE — ED Notes (Signed)
Pt ambulated to the restroom with standby assist.

## 2019-11-18 NOTE — Progress Notes (Signed)
PT Cancellation Note  Patient Details Name: Gary Gutierrez MRN: [id number] DOB: 1951-06-09   Cancelled Treatment:    Reason Eval/Treat Not Completed: Other (comment). Per care team, pt ambulatory with stand by assist and steady gait. Reached out to RN who stated there are no acute needs at this time. Will cancel current order. Please re-order if needs change.   [name] 11/18/2019, 1:12 PM Greggory Stallion, PT, DPT [phone]

## 2019-11-18 NOTE — Progress Notes (Signed)
Remdesivir - Pharmacy Brief Note   O:  ALT: 19 CXR: patchy airspace opacities within mid to lower right lung field suspicious for pneumonia SpO2: 94% on 2L O2   A/P:  Remdesivir 200 mg IVPB once followed by 100 mg IVPB daily x 4 days.   Dorena Bodo, PharmD 11/18/2019 12:01 PM

## 2019-11-18 NOTE — Discharge Summary (Signed)
[facility] Discharge Summary   Patient: Gary Gutierrez ONG:[id number]   PCP: Glean Hess, MD DOB: [birth year]   Date of admission: 11/17/2019   Date of discharge:  11/18/2019    Discharge Diagnoses:  Principal diagnosis Acute COVID-19 viral illness causing the respiratory distress Active Problems:   Acute on chronic combined systolic and diastolic CHF (congestive heart failure) ([place])   Admitted From: Home Disposition:  [facility]   Recommendations for Outpatient Follow-up:  1. PCP: Follow-up in 1 week  Follow-up Information    Glean Hess, MD. Schedule an appointment as soon as possible for a visit in 1 week(s).   Specialty: Internal Medicine Contact information: [address] [place] [address] [phone]          Diet recommendation: Cardiac diet and Carb modified diet  Activity: The patient is advised to gradually reintroduce usual activities,as tolerated.  Discharge Condition: good  Code Status: Full code   History of present illness: As per the H and P dictated on admission, "Gary Gutierrez is a 68 y.o. male with Past medical history of chronic systolic CHF, CAD, type II DM, HLD, HTN. Patient presents with complaints of cough fever chills as well as shortness of breath. This is ongoing for last 2 days. Denies any chest pain or chest tightness. Shortness of breath is not described as orthopnea or PND but it is primarily just shortness of breath on exertion. Cough is productive with yellowish expectoration without any blood. No nausea no vomiting.  No diarrhea.  No recent change in medication.  No exposure no travel."  Hospital Course:  Summary of his active problems in the hospital is as following. 1.  Acute COVID-19 Viral illness Lab Results  Component Value Date   SARSCOV2NAA POSITIVE (A) 11/17/2019   [place] NEGATIVE 06/05/2019   CXR: hazy bilateral peripheral opacities  Recent Labs   11/17/19 1539 11/17/19 1615 11/18/19 0939  FERRITIN 262  --  541*  CRP  --  10.2* 13.6*    Tmax last 24 hours:  Temp (24hrs), Avg:98.1 F (36.7 C), Min:97.9 F (36.6 C), Max:98.4 F (36.9 C)   Oxygen requirements: On 2 LPM  Antibiotics: Ceftriaxone and azithromycin, continue monitor cultures are negative Diuretics: Home torsemide continued Vitamin C and Zinc: Started on 11/18/2019. DVT Prophylaxis: Therapeutic Anticoagulation with Heparin  Remdesivir: Started on 11/18/2019 Steroids: Decadron on 11/18/2019 Actemra: Not received.  Prone positioning: Patient encouraged to stay in prone position as much as possible.  PPE During this encounter: Patient Isolation: Airborne + Droplet + Contact HCP PPE: CAPR, gown. gloves Patient PPE: None  The treatment plan and use of medications and known side effects were discussed with patient/family. It was clearly explained that there is no proven definitive treatment for COVID-19 infection yet. Any medications used here are based on case reports/anecdotal data which are not peer-reviewed and has not been studied using randomized control trials.  Complete risks and long-term side effects are unknown, however in the best clinical judgment they seem to be of some clinical benefit rather than medical risks.  Patient/family agree with the treatment plan and want to receive these treatments as indicated.   2.   Community-acquired pneumonia Sepsis POA Elevated procalcitonin level.  Tachycardia.  Fever and leukocytosis on admission. We will treat with IV ceftriaxone and azithromycin. Follow-up on cultures.    3.  chronic combined systolic and diastolic CHF. Initial concern was a patient presents with complaints of shortness of breath  and may have acute on chronic combined systolic and diastolic CHF. Currently appears euvolemic at the clinical examination. Appears to have a respiratory illness infection rather than CHF exacerbation. We will  continue with home regimen.  3.  Type 2 diabetes mellitus.  Uncontrolled with hyperglycemia.  With chronic renal complication. Continuing home insulin Lantus. Holding home oral hypoglycemic agents. Check hemoglobin A1c.  4.  Hyperlipidemia. Continue Lipitor.  5.  Chronic kidney disease stage IIIa renal function stable. Continue home regimen for now.  6. Elevated troponin Elevated d dimer Intermediate possibility of PE based on the VQ scan. Due to h/o CAD and CHF, and presentation with Shortness of breath troponin were checked  Elevated levels seen EDP started the pt on heparin Will continue the same Low suspicion for ACS for now Continue heparin.  On the day of the discharge the patient's vitals were stable, and no other acute medical condition were reported by patient.   Consultants: none Procedures: Echocardiogram   DISCHARGE MEDICATION: Allergies as of 11/18/2019      Reactions   Entresto [sacubitril-valsartan] Other (See Comments)   Low blood pressure   Penicillins Other (See Comments)   Other reaction(s): Unknown, pt does not know reactions      Medication List    STOP taking these medications   ibuprofen 800 MG tablet Commonly known as: ADVIL     TAKE these medications   Accu-Chek Aviva Plus test strip Generic drug: glucose blood TEST BLOOD SUGAR EVERY DAY AS DIRECTED What changed: See the new instructions.   albuterol 108 (90 Base) MCG/ACT inhaler Commonly known as: VENTOLIN HFA Inhale 2 puffs into the lungs every 6 (six) hours as needed for wheezing or shortness of breath.   allopurinol 300 MG tablet Commonly known as: ZYLOPRIM TAKE 1 TABLET BY MOUTH EVERY DAY   ascorbic acid 500 MG tablet Commonly known as: VITAMIN C Take 1 tablet (500 mg total) by mouth daily. Start taking on: November 19, 2019   aspirin EC 81 MG tablet Take 81 mg by mouth daily.   atorvastatin 80 MG tablet Commonly known as: LIPITOR TAKE 1 TABLET(80 MG) BY MOUTH AT  BEDTIME What changed: See the new instructions.   benzonatate 100 MG capsule Commonly known as: TESSALON Take 1 capsule (100 mg total) by mouth 3 (three) times daily.   cetirizine 10 MG tablet Commonly known as: ZYRTEC Take 10 mg by mouth daily.   clopidogrel 75 MG tablet Commonly known as: PLAVIX TAKE 1 TABLET(75 MG) BY MOUTH DAILY What changed: See the new instructions.   dexamethasone 10 MG/ML injection Commonly known as: DECADRON Inject 0.6 mLs (6 mg total) into the vein daily. Start taking on: November 19, 2019   Entresto 24-26 [facility] Generic drug: sacubitril-valsartan Take 0.5 tablets by mouth 2 (two) times daily.   glipiZIDE 10 MG 24 hr tablet Commonly known as: GLUCOTROL XL TAKE 1 TABLET(10 MG) BY MOUTH DAILY What changed: See the new instructions.   Ipratropium-Albuterol 20-100 MCG/ACT Aers respimat Commonly known as: COMBIVENT Inhale 1 puff into the lungs every 6 (six) hours.   isosorbide mononitrate 30 MG 24 hr tablet Commonly known as: Imdur Take 1 tablet (30 mg total) by mouth daily.   Jardiance 25 MG Tabs tablet Generic drug: empagliflozin TAKE 1 TABLET BY MOUTH DAILY What changed: how much to take   Kombiglyze XR 2.04-999 MG Tb24 Generic drug: Saxagliptin-Metformin TAKE 2 TABLETS BY MOUTH DAILY   Lantus SoloStar 100 UNIT/ML Solostar Pen Generic drug: Insulin Glargine  Inject 50 Units into the skin daily.   nitroGLYCERIN 0.4 MG SL tablet Commonly known as: Nitrostat Place 1 tablet (0.4 mg total) under the tongue every 5 (five) minutes as needed for chest pain.   pantoprazole 40 MG tablet Commonly known as: PROTONIX TAKE 1 TABLET(40 MG) BY MOUTH TWICE DAILY What changed: See the new instructions.   PEN NEEDLES 31GX5/16" 31G X 8 MM Misc 1 each by Does not apply route daily.   potassium chloride SA 20 MEQ tablet Commonly known as: KLOR-CON TAKE 2 TABLETS(40 MEQ) BY MOUTH DAILY What changed: See the new instructions.   torsemide 20 MG tablet  Commonly known as: DEMADEX TAKE 2 TABLETS(40 MG) BY MOUTH TWICE DAILY What changed: See the new instructions.   umeclidinium-vilanterol 62.5-25 MCG/INH Aepb Commonly known as: ANORO ELLIPTA Inhale 1 puff into the lungs daily.   zinc sulfate 220 (50 Zn) MG capsule Take 1 capsule (220 mg total) by mouth daily. Start taking on: November 19, 2019      Allergies  Allergen Reactions  . Entresto [Sacubitril-Valsartan] Other (See Comments)    Low blood pressure  . Penicillins Other (See Comments)    Other reaction(s): Unknown, pt does not know reactions   Discharge Instructions    Diet - low sodium heart healthy   Complete by: As directed    Diet Carb Modified   Complete by: As directed    Increase activity slowly   Complete by: As directed      Discharge Exam: Filed Weights   11/17/19 1139  Weight: 83 kg   Vitals:   11/18/19 1700 11/18/19 1730  BP: (!) 97/59 92/70  Pulse: 90 95  Resp: (!) 22 20  Temp:    SpO2: 95% 95%   General: Appear in mild distress, no Rash; Oral Mucosa Clear, moist. no Abnormal Mass Or lumps Cardiovascular: S1 and S2 Present, no Murmur, Respiratory: increased respiratory effort, Bilateral Air entry present and bilateral  Crackles, pre wheezes Abdomen: Bowel Sound present, Soft and no tenderness, no hernia Extremities: no Pedal edema, no calf tenderness Neurology: alert and oriented to time, place, and person affect appropriate.  The results of significant diagnostics from this hospitalization (including imaging, microbiology, ancillary and laboratory) are listed below for reference.    Significant Diagnostic Studies: Dg Chest 2 View  Result Date: 11/17/2019 CLINICAL DATA:  Suspected sepsis. Additional history provided: Shortness of breath, fever, cough. EXAM: CHEST - 2 VIEW COMPARISON:  Chest radiograph 06/05/2019 FINDINGS: Sequela of prior median sternotomy. The heart is at the upper limits of normal for size. Pulmonary vascular congestion.  Superimposed upon chronic interstitial prominence, there is patchy opacity within the mid to lower right lung field suspicious for pneumonia. No evidence of pneumothorax. No sizable pleural effusion. No acute bony abnormality. IMPRESSION: Patchy airspace opacities within the mid to lower right lung field suspicious for pneumonia. Pulmonary vascular congestion. Redemonstrated chronic interstitial prominence. Electronically Signed   By: Jackey Loge DO   On: 11/17/2019 12:23   Nm Pulmonary Perfusion  Result Date: 11/18/2019 CLINICAL DATA:  COVID positive patient. Positive D-dimer. Negative Doppler ultrasound EXAM: NUCLEAR MEDICINE PERFUSION LUNG SCAN TECHNIQUE: Perfusion images were obtained in multiple projections after intravenous injection of radiopharmaceutical. RADIOPHARMACEUTICALS:  4.8 mCi Tc-2m MAA COMPARISON:  None. FINDINGS: Decreased peripheral perfusion in the RIGHT upper lobe anterolateral seen on 2 projections could represent pulmonary embolism. There is pneumonia in this region on radiograph. Decreased decreased perfusion lingular region is favored relate to pneumonia. IMPRESSION: Decreased perfusion  in the RIGHT upper lobe anterior laterally with differential include pulmonary embolism versus pneumonia. These results will be called to the ordering clinician or representative by the Radiologist Assistant, and communication documented in the PACS or zVision Dashboard. Electronically Signed   By: Genevive Bi M.D.   On: 11/18/2019 16:37   US Venous Img Lower Bilateral (dvt)  Result Date: 11/17/2019 CLINICAL DATA:  Edema with positive D-dimer EXAM: BILATERAL LOWER EXTREMITY VENOUS DOPPLER ULTRASOUND TECHNIQUE: Gray-scale sonography with graded compression, as well as color Doppler and duplex ultrasound were performed to evaluate the lower extremity deep venous systems from the level of the common femoral vein and including the common femoral, femoral, profunda femoral, popliteal and calf  veins including the posterior tibial, peroneal and gastrocnemius veins when visible. The superficial great saphenous vein was also interrogated. Spectral Doppler was utilized to evaluate flow at rest and with distal augmentation maneuvers in the common femoral, femoral and popliteal veins. COMPARISON:  None. FINDINGS: RIGHT LOWER EXTREMITY Common Femoral Vein: No evidence of thrombus. Normal compressibility, respiratory phasicity and response to augmentation. Saphenofemoral Junction: No evidence of thrombus. Normal compressibility and flow on color Doppler imaging. Profunda Femoral Vein: No evidence of thrombus. Normal compressibility and flow on color Doppler imaging. Femoral Vein: No evidence of thrombus. Normal compressibility, respiratory phasicity and response to augmentation. Popliteal Vein: No evidence of thrombus. Normal compressibility, respiratory phasicity and response to augmentation. Calf Veins: No evidence of thrombus. Normal compressibility and flow on color Doppler imaging. LEFT LOWER EXTREMITY Common Femoral Vein: No evidence of thrombus. Normal compressibility, respiratory phasicity and response to augmentation. Saphenofemoral Junction: No evidence of thrombus. Normal compressibility and flow on color Doppler imaging. Profunda Femoral Vein: No evidence of thrombus. Normal compressibility and flow on color Doppler imaging. Femoral Vein: No evidence of thrombus. Normal compressibility, respiratory phasicity and response to augmentation. Popliteal Vein: No evidence of thrombus. Normal compressibility, respiratory phasicity and response to augmentation. Calf Veins: No evidence of thrombus. Normal compressibility and flow on color Doppler imaging. IMPRESSION: No evidence of deep venous thrombosis in either lower extremity. Electronically Signed   By: Jasmine Pang M.D.   On: 11/17/2019 21:02    Microbiology: Recent Results (from the past 240 hour(s))  Culture, blood (Routine x 2)     Status: None  (Preliminary result)   Collection Time: 11/17/19 11:51 AM   Specimen: BLOOD  Result Value Ref Range Status   Specimen Description BLOOD LEFT ANTECUBITAL  Final   Special Requests   Final    BOTTLES DRAWN AEROBIC AND ANAEROBIC Blood Culture adequate volume   Culture   Final    NO GROWTH < 24 HOURS Performed at [facility], [address], [place], [place] [address]    Report Status PENDING  Incomplete  Culture, blood (Routine x 2)     Status: None (Preliminary result)   Collection Time: 11/17/19  1:55 PM   Specimen: BLOOD  Result Value Ref Range Status   Specimen Description BLOOD LEFT ANTECUBITAL  Final   Special Requests   Final    BOTTLES DRAWN AEROBIC AND ANAEROBIC Blood Culture adequate volume   Culture   Final    NO GROWTH < 24 HOURS Performed at [facility], [address], [place], [place] [address]    Report Status PENDING  Incomplete  SARS CORONAVIRUS 2 (TAT 6-24 HRS) Nasopharyngeal Nasopharyngeal Swab     Status: Abnormal   Collection Time: 11/17/19  3:39 PM   Specimen: Nasopharyngeal Swab  Result  Value Ref Range Status   SARS Coronavirus 2 POSITIVE (A) NEGATIVE Final    Comment: RESULT CALLED TO, READ BACK BY AND VERIFIED WITH: V ASHLEY,RN [id number] 11/18/2019 D BRADLEY (NOTE) SARS-CoV-2 target nucleic acids are DETECTED. The SARS-CoV-2 RNA is generally detectable in upper and lower respiratory specimens during the acute phase of infection. Positive results are indicative of active infection with SARS-CoV-2. Clinical  correlation with patient history and other diagnostic information is necessary to determine patient infection status. Positive results do  not rule out bacterial infection or co-infection with other viruses. The expected result is Negative. Fact Sheet for Patients: HairSlick.no Fact Sheet for Healthcare Providers: quierodirigir.com This test is not yet approved or cleared by the  [place] FDA and  has been authorized for detection and/or diagnosis of SARS-CoV-2 by FDA under an Emergency Use Authorization (EUA). This EUA will remain  in effect (meaning this test can be used) for  the duration of the COVID-19 declaration under Section 564(b)(1) of the Act, 21 U.S.C. section 360bbb-3(b)(1), unless the authorization is terminated or revoked sooner. Performed at [facility], [address], [place], [place] [address]   Sputum culture     Status: None   Collection Time: 11/18/19  9:46 AM   Specimen: Expectorated Sputum  Result Value Ref Range Status   Specimen Description EXPECTORATED SPUTUM  Final   Special Requests NONE  Final   Sputum evaluation   Final    THIS SPECIMEN IS ACCEPTABLE FOR SPUTUM CULTURE Performed at [facility], [address], [place], [place] [address]    Report Status 11/18/2019 FINAL  Final  Culture, respiratory     Status: None (Preliminary result)   Collection Time: 11/18/19  9:46 AM  Result Value Ref Range Status   Specimen Description   Final    EXPECTORATED SPUTUM Performed at [facility], [address], [place], [place] [address]    Special Requests   Final    NONE Reflexed from [id number] Performed at [facility], [address] [place], [place] [address]    Gram Stain   Final    RARE WBC PRESENT,BOTH PMN AND MONONUCLEAR NO ORGANISMS SEEN Performed at [facility], [address] [place], [place] [address]    Culture PENDING  Incomplete   Report Status PENDING  Incomplete     Labs: CBC: Recent Labs  Lab 11/17/19 1151 11/18/19 0519  WBC 6.0 6.1  NEUTROABS 5.1  --   HGB 15.7 13.2  HCT 45.8 39.5  MCV 91.2 91.9  PLT 140* 121*   Basic Metabolic Panel: Recent Labs  Lab 11/17/19 1151 11/18/19 0519  NA 135 133*  K 3.5 3.4*  CL 85* 90*  CO2 28 28  GLUCOSE 182* 123*  BUN 36* 39*  CREATININE 1.57* 1.53*  CALCIUM 8.4* 7.6*   Liver Function Tests: Recent Labs   Lab 11/17/19 1151 11/18/19 0519  AST 28 31  ALT 19 19  ALKPHOS 93 66  BILITOT 1.0 1.0  PROT 7.9 6.3*  ALBUMIN 3.8 3.0*   No results for input(s): LIPASE, AMYLASE in the last 168 hours. No results for input(s): AMMONIA in the last 168 hours. Cardiac Enzymes: No results for input(s): CKTOTAL, CKMB, CKMBINDEX, TROPONINI in the last 168 hours. BNP (last 3 results) Recent Labs    10/18/19 1002 10/22/19 0852 11/18/19 0939  BNP 272.0* 329.0* 564.0*   CBG: Recent Labs  Lab 11/17/19 2239 11/18/19 0349 11/18/19 0804 11/18/19 1156 11/18/19  1709  GLUCAP 138* 113* 125* 186* 261*    Time spent: 35 minutes  Signed:  Lynden Oxfordranav Needham Biggins  [facility]  11/18/2019 7:01 PM

## 2019-11-18 NOTE — ED Notes (Signed)
Pt lying in bed asleep, eyes closed, equal unlabored RR. Pt in NAD at this time. VS stable WNL

## 2019-11-18 NOTE — ED Notes (Signed)
Pt given breakfast tray at this time. 

## 2019-11-18 NOTE — Consult Note (Signed)
ANTICOAGULATION CONSULT NOTE  Pharmacy Consult for Heparin  Indication: ACS/STEMI  Patient Measurements: Height: 5\' 9"  (175.3 cm) Weight: 183 lb (83 kg) IBW/kg (Calculated) : 70.7 Heparin Dosing Weight: 83 kg   Vital Signs: BP: 104/74 (11/19 1300) Pulse Rate: 91 (11/19 1300)  Labs: Recent Labs    11/17/19 1151 11/17/19 1223  11/17/19 1539 11/17/19 2250 11/18/19 0519 11/18/19 0939 11/18/19 1208  HGB 15.7  --   --   --   --  13.2  --   --   HCT 45.8  --   --   --   --  39.5  --   --   PLT 140*  --   --   --   --  121*  --   --   APTT  --   --   --  35  --   --   --   --   LABPROT  --  13.4  --   --   --   --   --   --   INR  --  1.0  --   --   --   --   --   --   HEPARINUNFRC  --   --   --   --  0.27*  --  0.36  --   CREATININE 1.57*  --   --   --   --  1.53*  --   --   TROPONINIHS 84*  --    < > 158*  --   --  109* 100*   < > = values in this interval not displayed.    Estimated Creatinine Clearance: 45.6 mL/min (A) (by C-G formula based on SCr of 1.53 mg/dL (H)).   Medications:  No PTA anticoagulants per [facility] and Care Everywhere. Patient was unable to confirm whether he is on anticoagulants but reported being on Plavix and Aspirin.  Last dose of antiplatelets were this morning.  Assessment: Pharmacy has been consulted to dose heparin for ACS/STEMI. Patient presented with shortness of breath, fever, chills, generalized malaise, and denies chest pain. Patient is currently being treated for CAP with Cefepime. Patient does have a PMH significant for COPD and CHF.   Given the patient has likely only taken antiplatelets, will start heparin with bolus.   Troponin 84 > 139  Goal of Therapy:  Heparin level 0.3-0.7 units/ml Monitor platelets by anticoagulation protocol: Yes   Plan:   11/19 1700 HL 0.36 therapeutic x 2: continue heparin at 1100 units/hr  Next HL tomorrow am and each am while therapeutic  CBC daily  Vallery Sa, PharmD Clinical  Pharmacist 11/18/2019,1:57 PM

## 2019-11-18 NOTE — ED Notes (Signed)
Admitting MD in room to assess patient.  Will continue to monitor.   

## 2019-11-18 NOTE — Consult Note (Signed)
ANTICOAGULATION CONSULT NOTE - Follow Up Consult  Pharmacy Consult for Heparin  Indication: ACS/STEMI  Allergies  Allergen Reactions  . Entresto [Sacubitril-Valsartan] Other (See Comments)    Low blood pressure  . Penicillins Other (See Comments)    Other reaction(s): Unknown, pt does not know reactions    Patient Measurements: Height: 5\' 9"  (175.3 cm) Weight: 183 lb (83 kg) IBW/kg (Calculated) : 70.7 Heparin Dosing Weight: 83 kg   Vital Signs: Temp: 98.4 F (36.9 C) (11/18 1939) Temp Source: Oral (11/18 1939) BP: 103/76 (11/18 2315) Pulse Rate: 105 (11/18 2315)  Labs: Recent Labs    11/17/19 1151 11/17/19 1223 11/17/19 1355 11/17/19 1539 11/17/19 2250  HGB 15.7  --   --   --   --   HCT 45.8  --   --   --   --   PLT 140*  --   --   --   --   APTT  --   --   --  35  --   LABPROT  --  13.4  --   --   --   INR  --  1.0  --   --   --   HEPARINUNFRC  --   --   --   --  0.27*  CREATININE 1.57*  --   --   --   --   TROPONINIHS 84*  --  139* 158*  --     Estimated Creatinine Clearance: 44.4 mL/min (A) (by C-G formula based on SCr of 1.57 mg/dL (H)).   Medications:  No PTA anticoagulants per [facility] and Care Everywhere. Patient was unable to confirm whether he is on anticoagulants but reported being on Plavix and Aspirin.  Last dose of antiplatelets were this morning.  Assessment: Pharmacy has been consulted to dose heparin for ACS/STEMI. Patient presented with shortness of breath, fever, chills, generalized malaise, and denies chest pain. Patient is currently being treated for CAP with Cefepime. Patient does have a PMH significant for COPD and CHF.   Given the patient has likely only taken antiplatelets, will start heparin with bolus.   Troponin 84 > 139  Goal of Therapy:  Heparin level 0.3-0.7 units/ml Monitor platelets by anticoagulation protocol: Yes   Plan:  11/18 @ 2200 HL 0.27 subtherapeutic. Will increase rate to 1100 units/hr and will recheck HL @ 0800,  baseline CBC WNL, plts low for patient, will continue to monitor.  Tobie Lords, PharmD, BCPS Clinical Pharmacist 11/18/2019,12:10 AM

## 2019-11-18 NOTE — Consult Note (Signed)
[place] for Heparin  Indication: ACS/STEMI  Allergies  Allergen Reactions  . Entresto [Sacubitril-Valsartan] Other (See Comments)    Low blood pressure  . Penicillins Other (See Comments)    Other reaction(s): Unknown, pt does not know reactions    Patient Measurements: Height: 5\' 9"  (175.3 cm) Weight: 183 lb (83 kg) IBW/kg (Calculated) : 70.7 Heparin Dosing Weight: 83 kg   Vital Signs: Temp: 97.9 F (36.6 C) (11/19 0012) Temp Source: Oral (11/19 0012) BP: 110/60 (11/19 1030) Pulse Rate: 97 (11/19 1030)  Labs: Recent Labs    11/17/19 1151 11/17/19 1223 11/17/19 1355 11/17/19 1539 11/17/19 2250 11/18/19 0519 11/18/19 0939  HGB 15.7  --   --   --   --  13.2  --   HCT 45.8  --   --   --   --  39.5  --   PLT 140*  --   --   --   --  121*  --   APTT  --   --   --  35  --   --   --   LABPROT  --  13.4  --   --   --   --   --   INR  --  1.0  --   --   --   --   --   HEPARINUNFRC  --   --   --   --  0.27*  --  0.36  CREATININE 1.57*  --   --   --   --  1.53*  --   TROPONINIHS 84*  --  139* 158*  --   --  109*    Estimated Creatinine Clearance: 45.6 mL/min (A) (by C-G formula based on SCr of 1.53 mg/dL (H)).   Medications:  No PTA anticoagulants per [facility] and Care Everywhere. Patient was unable to confirm whether he is on anticoagulants but reported being on Plavix and Aspirin.  Last dose of antiplatelets were this morning.  Assessment: Pharmacy has been consulted to dose heparin for ACS/STEMI. Patient presented with shortness of breath, fever, chills, generalized malaise, and denies chest pain. Patient is currently being treated for CAP with Cefepime. Patient does have a PMH significant for COPD and CHF.   Given the patient has likely only taken antiplatelets, will start heparin with bolus.   Troponin 84 > 139  Goal of Therapy:  Heparin level 0.3-0.7 units/ml Monitor platelets by anticoagulation protocol: Yes   Plan:   11/19 0939 HL 0.36 therapeutic x 1. Will check HL at 1600 to confirm. CBC daily. Will follow HL daily once therapeutic x 2.  Dorena Bodo, PharmD Clinical Pharmacist 11/18/2019,10:56 AM

## 2019-11-18 NOTE — ED Notes (Signed)
Pt adjusted back in bed. Pt c/o hunger but no appetite. Pt given a water pitcher as requested. Pt dyspneic upon exertion to restroom. Pt was updated on the progress of his admission

## 2019-11-18 NOTE — ED Notes (Signed)
Pt lying in bed awake on the phone with family. VS stable and pt in NAD at this time. Nothing needed from staff but will continue to monitor

## 2019-11-19 ENCOUNTER — Encounter (HOSPITAL_COMMUNITY): Payer: Self-pay

## 2019-11-19 ENCOUNTER — Inpatient Hospital Stay (HOSPITAL_COMMUNITY)
Admission: AD | Admit: 2019-11-19 | Discharge: 2019-11-22 | DRG: 177 | Disposition: A | Discharge status: Home / Self Care | Payer: Medicare Other | Source: Other Acute Inpatient Hospital | Attending: Internal Medicine | Admitting: Internal Medicine

## 2019-11-19 ENCOUNTER — Other Ambulatory Visit: Payer: Self-pay

## 2019-11-19 DIAGNOSIS — J449 Chronic obstructive pulmonary disease, unspecified: Secondary | ICD-10-CM | POA: Diagnosis not present

## 2019-11-19 DIAGNOSIS — J9601 Acute respiratory failure with hypoxia: Secondary | ICD-10-CM | POA: Diagnosis present

## 2019-11-19 DIAGNOSIS — Z8042 Family history of malignant neoplasm of prostate: Secondary | ICD-10-CM

## 2019-11-19 DIAGNOSIS — R7989 Other specified abnormal findings of blood chemistry: Secondary | ICD-10-CM | POA: Diagnosis not present

## 2019-11-19 DIAGNOSIS — J1289 Other viral pneumonia: Secondary | ICD-10-CM | POA: Diagnosis present

## 2019-11-19 DIAGNOSIS — T380X5A Adverse effect of glucocorticoids and synthetic analogues, initial encounter: Secondary | ICD-10-CM | POA: Diagnosis not present

## 2019-11-19 DIAGNOSIS — Z888 Allergy status to other drugs, medicaments and biological substances status: Secondary | ICD-10-CM

## 2019-11-19 DIAGNOSIS — U071 COVID-19: Principal | ICD-10-CM | POA: Diagnosis present

## 2019-11-19 DIAGNOSIS — E871 Hypo-osmolality and hyponatremia: Secondary | ICD-10-CM | POA: Diagnosis not present

## 2019-11-19 DIAGNOSIS — I5022 Chronic systolic (congestive) heart failure: Secondary | ICD-10-CM | POA: Diagnosis present

## 2019-11-19 DIAGNOSIS — Z8249 Family history of ischemic heart disease and other diseases of the circulatory system: Secondary | ICD-10-CM

## 2019-11-19 DIAGNOSIS — I509 Heart failure, unspecified: Secondary | ICD-10-CM

## 2019-11-19 DIAGNOSIS — Z7902 Long term (current) use of antithrombotics/antiplatelets: Secondary | ICD-10-CM

## 2019-11-19 DIAGNOSIS — E1165 Type 2 diabetes mellitus with hyperglycemia: Secondary | ICD-10-CM | POA: Diagnosis not present

## 2019-11-19 DIAGNOSIS — E785 Hyperlipidemia, unspecified: Secondary | ICD-10-CM | POA: Diagnosis present

## 2019-11-19 DIAGNOSIS — Z951 Presence of aortocoronary bypass graft: Secondary | ICD-10-CM

## 2019-11-19 DIAGNOSIS — E875 Hyperkalemia: Secondary | ICD-10-CM | POA: Diagnosis present

## 2019-11-19 DIAGNOSIS — Z801 Family history of malignant neoplasm of trachea, bronchus and lung: Secondary | ICD-10-CM | POA: Diagnosis not present

## 2019-11-19 DIAGNOSIS — J1282 Pneumonia due to coronavirus disease 2019: Secondary | ICD-10-CM

## 2019-11-19 DIAGNOSIS — Z79899 Other long term (current) drug therapy: Secondary | ICD-10-CM

## 2019-11-19 DIAGNOSIS — J440 Chronic obstructive pulmonary disease with acute lower respiratory infection: Secondary | ICD-10-CM | POA: Diagnosis not present

## 2019-11-19 DIAGNOSIS — Z794 Long term (current) use of insulin: Secondary | ICD-10-CM

## 2019-11-19 DIAGNOSIS — E860 Dehydration: Secondary | ICD-10-CM | POA: Diagnosis not present

## 2019-11-19 DIAGNOSIS — I25119 Atherosclerotic heart disease of native coronary artery with unspecified angina pectoris: Secondary | ICD-10-CM | POA: Diagnosis not present

## 2019-11-19 DIAGNOSIS — Z7982 Long term (current) use of aspirin: Secondary | ICD-10-CM

## 2019-11-19 DIAGNOSIS — E876 Hypokalemia: Secondary | ICD-10-CM | POA: Diagnosis present

## 2019-11-19 DIAGNOSIS — E1122 Type 2 diabetes mellitus with diabetic chronic kidney disease: Secondary | ICD-10-CM | POA: Diagnosis present

## 2019-11-19 DIAGNOSIS — J9600 Acute respiratory failure, unspecified whether with hypoxia or hypercapnia: Secondary | ICD-10-CM | POA: Diagnosis not present

## 2019-11-19 DIAGNOSIS — Z880 Allergy status to penicillin: Secondary | ICD-10-CM

## 2019-11-19 DIAGNOSIS — N1831 Chronic kidney disease, stage 3a: Secondary | ICD-10-CM | POA: Diagnosis present

## 2019-11-19 DIAGNOSIS — I130 Hypertensive heart and chronic kidney disease with heart failure and stage 1 through stage 4 chronic kidney disease, or unspecified chronic kidney disease: Secondary | ICD-10-CM | POA: Diagnosis not present

## 2019-11-19 DIAGNOSIS — I10 Essential (primary) hypertension: Secondary | ICD-10-CM | POA: Diagnosis not present

## 2019-11-19 DIAGNOSIS — N179 Acute kidney failure, unspecified: Secondary | ICD-10-CM | POA: Diagnosis not present

## 2019-11-19 DIAGNOSIS — Z833 Family history of diabetes mellitus: Secondary | ICD-10-CM | POA: Diagnosis not present

## 2019-11-19 HISTORY — DX: Pneumonia due to coronavirus disease 2019: J12.82

## 2019-11-19 HISTORY — DX: COVID-19: U07.1

## 2019-11-19 HISTORY — DX: Acute respiratory failure, unspecified whether with hypoxia or hypercapnia: J96.00

## 2019-11-19 LAB — CBC WITH DIFFERENTIAL/PLATELET
Abs Immature Granulocytes: 0.02 10*3/uL (ref 0.00–0.07)
Basophils Absolute: 0 10*3/uL (ref 0.0–0.1)
Basophils Relative: 0 %
Eosinophils Absolute: 0 10*3/uL (ref 0.0–0.5)
Eosinophils Relative: 0 %
HCT: 41 % (ref 39.0–52.0)
Hemoglobin: 13.8 g/dL (ref 13.0–17.0)
Immature Granulocytes: 0 %
Lymphocytes Relative: 13 %
Lymphs Abs: 0.7 10*3/uL (ref 0.7–4.0)
MCH: 31 pg (ref 26.0–34.0)
MCHC: 33.7 g/dL (ref 30.0–36.0)
MCV: 92.1 fL (ref 80.0–100.0)
Monocytes Absolute: 0.4 10*3/uL (ref 0.1–1.0)
Monocytes Relative: 7 %
Neutro Abs: 4.5 10*3/uL (ref 1.7–7.7)
Neutrophils Relative %: 80 %
Platelets: 133 10*3/uL — ABNORMAL LOW (ref 150–400)
RBC: 4.45 MIL/uL (ref 4.22–5.81)
RDW: 15.3 % (ref 11.5–15.5)
WBC: 5.6 10*3/uL (ref 4.0–10.5)
nRBC: 0 % (ref 0.0–0.2)

## 2019-11-19 LAB — ABO/RH: ABO/RH(D): O POS

## 2019-11-19 LAB — ECHOCARDIOGRAM COMPLETE
Height: 69 in
Weight: 2928 oz

## 2019-11-19 LAB — COMPREHENSIVE METABOLIC PANEL
ALT: 23 U/L (ref 0–44)
AST: 36 U/L (ref 15–41)
Albumin: 3.4 g/dL — ABNORMAL LOW (ref 3.5–5.0)
Alkaline Phosphatase: 80 U/L (ref 38–126)
Anion gap: 16 — ABNORMAL HIGH (ref 5–15)
BUN: 45 mg/dL — ABNORMAL HIGH (ref 8–23)
CO2: 25 mmol/L (ref 22–32)
Calcium: 7.8 mg/dL — ABNORMAL LOW (ref 8.9–10.3)
Chloride: 96 mmol/L — ABNORMAL LOW (ref 98–111)
Creatinine, Ser: 1.29 mg/dL — ABNORMAL HIGH (ref 0.61–1.24)
GFR calc Af Amer: >60 mL/min (ref >60)
GFR calc non Af Amer: 56 mL/min — ABNORMAL LOW (ref >60)
Glucose, Bld: 166 mg/dL — ABNORMAL HIGH (ref 70–99)
Potassium: 3 mmol/L — ABNORMAL LOW (ref 3.5–5.1)
Sodium: 137 mmol/L (ref 135–145)
Total Bilirubin: 1.2 mg/dL (ref 0.3–1.2)
Total Protein: 6.8 g/dL (ref 6.5–8.1)

## 2019-11-19 LAB — D-DIMER, QUANTITATIVE: D-Dimer, Quant: 1.57 ug/mL-FEU — ABNORMAL HIGH (ref 0.00–0.50)

## 2019-11-19 LAB — GLUCOSE, CAPILLARY
Glucose-Capillary: 164 mg/dL — ABNORMAL HIGH (ref 70–99)
Glucose-Capillary: 301 mg/dL — ABNORMAL HIGH (ref 70–99)
Glucose-Capillary: 281 mg/dL — ABNORMAL HIGH (ref 70–99)

## 2019-11-19 LAB — PROCALCITONIN: Procalcitonin: 1.18 ng/mL

## 2019-11-19 LAB — MAGNESIUM: Magnesium: 2.2 mg/dL (ref 1.7–2.4)

## 2019-11-19 LAB — C-REACTIVE PROTEIN: CRP: 14.6 mg/dL — ABNORMAL HIGH (ref <1.0)

## 2019-11-19 MED ORDER — POTASSIUM CHLORIDE CRYS ER 20 MEQ PO TBCR
40.0000 meq | EXTENDED_RELEASE_TABLET | Freq: Once | ORAL | Status: AC
  Administered 2019-11-19: 40 meq via ORAL
  Filled 2019-11-19: qty 2

## 2019-11-19 MED ORDER — SODIUM CHLORIDE 0.9 % IV SOLN
1.0000 g | INTRAVENOUS | Status: DC
  Administered 2019-11-20: 01:00:00 1 g via INTRAVENOUS
  Filled 2019-11-19: qty 10

## 2019-11-19 MED ORDER — ACETAMINOPHEN 325 MG PO TABS: 650.0000 mg | ORAL_TABLET | Freq: Four times a day (QID) | ORAL | Status: DC | PRN

## 2019-11-19 MED ORDER — SODIUM CHLORIDE 0.9% FLUSH
3.0000 mL | INTRAVENOUS | Status: DC | PRN
  Administered 2019-11-21: 09:00:00 3 mL via INTRAVENOUS
  Filled 2019-11-19: qty 3

## 2019-11-19 MED ORDER — SODIUM CHLORIDE 0.9 % IV SOLN: 250.0000 mL | INTRAVENOUS | Status: DC | PRN

## 2019-11-19 MED ORDER — HYDROCOD POLST-CPM POLST ER 10-8 MG/5ML PO SUER: 5.0000 mL | Freq: Two times a day (BID) | ORAL | Status: DC | PRN

## 2019-11-19 MED ORDER — INSULIN ASPART 100 UNIT/ML ~~LOC~~ SOLN
0.0000 [IU] | Freq: Three times a day (TID) | SUBCUTANEOUS | Status: DC
  Administered 2019-11-19 (×2): 4 [IU] via SUBCUTANEOUS
  Administered 2019-11-19: 1 [IU] via SUBCUTANEOUS
  Administered 2019-11-20: 5 [IU] via SUBCUTANEOUS
  Administered 2019-11-20: 1 [IU] via SUBCUTANEOUS
  Administered 2019-11-20 – 2019-11-21 (×2): 4 [IU] via SUBCUTANEOUS
  Administered 2019-11-21: 12:00:00 2 [IU] via SUBCUTANEOUS
  Administered 2019-11-21 – 2019-11-22 (×2): 1 [IU] via SUBCUTANEOUS
  Administered 2019-11-22: 3 [IU] via SUBCUTANEOUS

## 2019-11-19 MED ORDER — ZINC SULFATE 220 (50 ZN) MG PO CAPS
220.0000 mg | ORAL_CAPSULE | Freq: Every day | ORAL | Status: DC
  Administered 2019-11-19 – 2019-11-22 (×4): 220 mg via ORAL
  Filled 2019-11-19 (×4): qty 1

## 2019-11-19 MED ORDER — SODIUM CHLORIDE 0.9% FLUSH
3.0000 mL | Freq: Two times a day (BID) | INTRAVENOUS | Status: DC
  Administered 2019-11-19 – 2019-11-22 (×7): 3 mL via INTRAVENOUS

## 2019-11-19 MED ORDER — HYDROCOD POLST-CPM POLST ER 10-8 MG/5ML PO SUER
5.0000 mL | Freq: Two times a day (BID) | ORAL | Status: DC
  Administered 2019-11-19 – 2019-11-22 (×7): 5 mL via ORAL
  Filled 2019-11-19 (×7): qty 5

## 2019-11-19 MED ORDER — SODIUM CHLORIDE 0.9 % IV SOLN
100.0000 mg | INTRAVENOUS | Status: AC
  Administered 2019-11-19 – 2019-11-22 (×4): 100 mg via INTRAVENOUS
  Filled 2019-11-19 (×4): qty 100

## 2019-11-19 MED ORDER — ONDANSETRON HCL 4 MG PO TABS: 4.0000 mg | ORAL_TABLET | Freq: Four times a day (QID) | ORAL | Status: DC | PRN

## 2019-11-19 MED ORDER — DEXAMETHASONE SODIUM PHOSPHATE 10 MG/ML IJ SOLN
6.0000 mg | INTRAMUSCULAR | Status: DC
  Administered 2019-11-19 – 2019-11-22 (×4): 6 mg via INTRAVENOUS
  Filled 2019-11-19 (×4): qty 1

## 2019-11-19 MED ORDER — VITAMIN C 500 MG PO TABS
500.0000 mg | ORAL_TABLET | Freq: Every day | ORAL | Status: DC
  Administered 2019-11-19 – 2019-11-22 (×4): 500 mg via ORAL
  Filled 2019-11-19 (×4): qty 1

## 2019-11-19 MED ORDER — ONDANSETRON HCL 4 MG/2ML IJ SOLN: 4.0000 mg | Freq: Four times a day (QID) | INTRAMUSCULAR | Status: DC | PRN

## 2019-11-19 MED ORDER — DEXTROSE 5 % IV SOLN
250.0000 mg | INTRAVENOUS | Status: DC
  Filled 2019-11-19: qty 250

## 2019-11-19 MED ORDER — GUAIFENESIN-DM 100-10 MG/5ML PO SYRP
10.0000 mL | ORAL_SOLUTION | ORAL | Status: DC | PRN
  Filled 2019-11-19: qty 10

## 2019-11-19 MED ORDER — ENOXAPARIN SODIUM 40 MG/0.4ML ~~LOC~~ SOLN
0.5000 mg/kg | Freq: Two times a day (BID) | SUBCUTANEOUS | Status: DC
  Administered 2019-11-19 – 2019-11-20 (×3): 40 mg via SUBCUTANEOUS
  Filled 2019-11-19 (×3): qty 0.4

## 2019-11-19 MED ORDER — SODIUM CHLORIDE 0.9 % IV SOLN: 1.0000 g | INTRAVENOUS | Status: DC

## 2019-11-19 MED ORDER — AZITHROMYCIN 250 MG PO TABS
500.0000 mg | ORAL_TABLET | Freq: Every day | ORAL | Status: DC
  Administered 2019-11-19: 17:00:00 500 mg via ORAL
  Filled 2019-11-19: qty 2

## 2019-11-19 NOTE — H&P (Signed)
History and Physical    AMIT LEECE NWG:[id number] DOB: 1951-02-05 DOA: 11/19/2019  PCP: Glean Hess, MD  Patient coming from: Home  Chief Complaint: Shortness of breath  HPI: Gary Gutierrez is a 68 y.o. male with medical history significant of chronic systolic congestive heart failure, coronary artery disease, diabetes, hypertension, chronic kidney disease, hyperlipidemia comes in with several days of cough fever chills with worsening shortness of breath for over 2 days.  Patient found to be Covid positive with bilateral pneumonia on chest x-ray.  Patient initially treated with Rocephin and azithromycin due to elevated procalcitonin level.  Patient is feeling much better on 2 L of oxygen.  Is also been given remdesivir and steroids.  Patient being referred for admission for acute hypoxic respiratory failure secondary to Covid pneumonia.  Review of Systems: As per HPI otherwise 10 point review of systems negative.   Past Medical History:  Diagnosis Date  . Allergy   . CHF (congestive heart failure) ([place])   . Coronary artery disease   . Diabetes mellitus without complication ([place])   . Hyperlipidemia   . Hypertension     Past Surgical History:  Procedure Laterality Date  . APPENDECTOMY    . colonscopy  2010   benign polyps  . CORONARY ARTERY BYPASS GRAFT  1995  . CORONARY STENT INTERVENTION N/A 02/03/2017   Procedure: Coronary Stent Intervention;  Surgeon: Yolonda Kida, MD;  Location: [facility];  Service: Cardiovascular;  Laterality: N/A;  . ESOPHAGOGASTRODUODENOSCOPY  2010   normal  . LEFT HEART CATH AND CORONARY ANGIOGRAPHY N/A 02/03/2017   Procedure: Left Heart Cath and Coronary Angiography;  Surgeon: Yolonda Kida, MD;  Location: [facility];  Service: Cardiovascular;  Laterality: N/A;  . PERCUTANEOUS CORONARY STENT INTERVENTION (PCI-S)  2010, 2015   4 stents total     reports that he quit smoking about 24 years ago. His smoking use  included cigarettes. He has a 43.50 pack-year smoking history. He has never used smokeless tobacco. He reports that he does not drink alcohol or use drugs.  Allergies  Allergen Reactions  . Entresto [Sacubitril-Valsartan] Other (See Comments)    Low blood pressure  . Penicillins Other (See Comments)    Other reaction(s): Unknown, pt does not know reactions    Family History  Problem Relation Age of Onset  . Heart disease Father   . Lung cancer Brother   . Prostate cancer Brother   . Diabetes Mother   . Heart disease Mother   . Heart attack Mother   . Congestive Heart Failure Mother     Prior to Admission medications   Medication Sig Start Date End Date Taking? Authorizing Provider  ACCU-CHEK AVIVA PLUS test strip TEST BLOOD SUGAR EVERY DAY AS DIRECTED Patient taking differently: 1 each by Other route as directed.  10/19/19   Glean Hess, MD  albuterol (VENTOLIN HFA) 108 (90 Base) MCG/ACT inhaler Inhale 2 puffs into the lungs every 6 (six) hours as needed for wheezing or shortness of breath.    [provider]  allopurinol (ZYLOPRIM) 300 MG tablet TAKE 1 TABLET BY MOUTH EVERY DAY Patient taking differently: Take 300 mg by mouth daily.  08/09/19   Glean Hess, MD  aspirin EC 81 MG tablet Take 81 mg by mouth daily.     [provider]  atorvastatin (LIPITOR) 80 MG tablet TAKE 1 TABLET(80 MG) BY MOUTH AT BEDTIME Patient taking differently: Take 80 mg by  mouth at bedtime.  07/21/19   Reubin MilanBerglund, [name], MD  benzonatate (TESSALON) 100 MG capsule Take 1 capsule (100 mg total) by mouth 3 (three) times daily. 11/18/19   Rolly SalterPatel, Pranav M, MD  cetirizine (ZYRTEC) 10 MG tablet Take 10 mg by mouth daily.    [provider]  clopidogrel (PLAVIX) 75 MG tablet TAKE 1 TABLET(75 MG) BY MOUTH DAILY Patient taking differently: Take 75 mg by mouth daily.  08/09/19   Reubin MilanBerglund, [name], MD  dexamethasone (DECADRON) 10 MG/ML injection Inject 0.6 mLs (6 mg total) into the  vein daily. 11/19/19   Rolly SalterPatel, Pranav M, MD  glipiZIDE (GLUCOTROL XL) 10 MG 24 hr tablet TAKE 1 TABLET(10 MG) BY MOUTH DAILY Patient taking differently: Take 10 mg by mouth daily.  05/11/19   Reubin MilanBerglund, [name], MD  Insulin Glargine (LANTUS SOLOSTAR) 100 UNIT/ML Solostar Pen Inject 50 Units into the skin daily. 08/14/19   Reubin MilanBerglund, [name], MD  Insulin Pen Needle (PEN NEEDLES 31GX5/16") 31G X 8 MM MISC 1 each by Does not apply route daily. 06/17/18   Reubin MilanBerglund, [name], MD  Ipratropium-Albuterol (COMBIVENT) 20-100 MCG/ACT AERS respimat Inhale 1 puff into the lungs every 6 (six) hours. 11/18/19   Rolly SalterPatel, Pranav M, MD  isosorbide mononitrate (IMDUR) 30 MG 24 hr tablet Take 1 tablet (30 mg total) by mouth daily. 02/13/17 11/17/19  [name], [name], MD  JARDIANCE 25 MG TABS tablet TAKE 1 TABLET BY MOUTH DAILY Patient taking differently: Take 25 mg by mouth daily.  10/18/18   Reubin MilanBerglund, [name], MD  KOMBIGLYZE XR 2.04-999 MG TB24 TAKE 2 TABLETS BY MOUTH DAILY Patient taking differently: Take 2 tablets by mouth daily.  10/23/19   Reubin MilanBerglund, [name], MD  nitroGLYCERIN (NITROSTAT) 0.4 MG SL tablet Place 1 tablet (0.4 mg total) under the tongue every 5 (five) minutes as needed for chest pain. 10/18/19   Reubin MilanBerglund, [name], MD  pantoprazole (PROTONIX) 40 MG tablet TAKE 1 TABLET(40 MG) BY MOUTH TWICE DAILY Patient taking differently: Take 40 mg by mouth daily.  06/10/19   Reubin MilanBerglund, [name], MD  potassium chloride SA (K-DUR,KLOR-CON) 20 MEQ tablet TAKE 2 TABLETS(40 MEQ) BY MOUTH DAILY Patient taking differently: Take 40 mEq by mouth daily.  11/01/18   Delma FreezeHackney, Tina A, FNP  sacubitril-valsartan (ENTRESTO) 24-26 MG Take 0.5 tablets by mouth 2 (two) times daily.    [provider]  torsemide (DEMADEX) 20 MG tablet TAKE 2 TABLETS(40 MG) BY MOUTH TWICE DAILY Patient taking differently: Take 80 mg by mouth 2 (two) times daily.  04/02/19   Clarisa KindredHackney, Tina A, FNP  umeclidinium-vilanterol (ANORO ELLIPTA) 62.5-25 MCG/INH AEPB  Inhale 1 puff into the lungs daily. Patient not taking: Reported on 11/17/2019 04/22/19   Reubin MilanBerglund, [name], MD  vitamin C (VITAMIN C) 500 MG tablet Take 1 tablet (500 mg total) by mouth daily. 11/19/19   Rolly SalterPatel, Pranav M, MD  zinc sulfate 220 (50 Zn) MG capsule Take 1 capsule (220 mg total) by mouth daily. 11/19/19   Rolly SalterPatel, Pranav M, MD  ibuprofen (ADVIL,MOTRIN) 800 MG tablet Take 1 tablet (800 mg total) by mouth every 8 (eight) hours as needed. 04/13/19   Reubin MilanBerglund, [name], MD    Physical Exam: Vitals:   11/19/19 0421 11/19/19 0444 11/19/19 0445 11/19/19 0600  BP: (!) 141/72 102/76    Pulse: 86 86    Resp: (!) 22 18    Temp: 98.2 F (36.8 C) 98.2 F (36.8 C)    TempSrc:  Oral Oral    SpO2: 97%   (!) 88%  Weight:  83.2 kg 83.2 kg   Height:  (1.753 m) 5' 9.02" (1.753 m)        Constitutional: NAD, calm, comfortable Vitals:   11/19/19 0421 11/19/19 0444 11/19/19 0445 11/19/19 0600  BP: (!) 141/72 102/76    Pulse: 86 86    Resp: (!) 22 18    Temp: 98.2 F (36.8 C) 98.2 F (36.8 C)    TempSrc: Oral Oral    SpO2: 97%   (!) 88%  Weight:  83.2 kg 83.2 kg   Height:  (1.753 m) 5' 9.02" (1.753 m)     Eyes: PERRL, lids and conjunctivae normal ENMT: Mucous membranes are moist. Posterior pharynx clear of any exudate or lesions.Normal dentition.  Neck: normal, supple, no masses, no thyromegaly Respiratory: clear to auscultation bilaterally, no wheezing, no crackles. Normal respiratory effort. No accessory muscle use.  Cardiovascular: Regular rate and rhythm, no murmurs / rubs / gallops. No extremity edema. 2+ pedal pulses. No carotid bruits.  Abdomen: no tenderness, no masses palpated. No hepatosplenomegaly. Bowel sounds positive.  Musculoskeletal: no clubbing / cyanosis. No joint deformity upper and lower extremities. Good ROM, no contractures. Normal muscle tone.  Skin: no rashes, lesions, ulcers. No induration Neurologic: CN 2-12 grossly intact. Sensation intact, DTR normal.  Strength 5/5 in all 4.  Psychiatric: Normal judgment and insight. Alert and oriented x 3. Normal mood.    Labs on Admission: I have personally reviewed following labs and imaging studies  CBC: Recent Labs  Lab 11/17/19 1151 11/18/19 0519  WBC 6.0 6.1  NEUTROABS 5.1  --   HGB 15.7 13.2  HCT 45.8 39.5  MCV 91.2 91.9  PLT 140* 121*   Basic Metabolic Panel: Recent Labs  Lab 11/17/19 1151 11/18/19 0519  NA 135 133*  K 3.5 3.4*  CL 85* 90*  CO2 28 28  GLUCOSE 182* 123*  BUN 36* 39*  CREATININE 1.57* 1.53*  CALCIUM 8.4* 7.6*   GFR: Estimated Creatinine Clearance: 45.6 mL/min (A) (by C-G formula based on SCr of 1.53 mg/dL (H)). Liver Function Tests: Recent Labs  Lab 11/17/19 1151 11/18/19 0519  AST 28 31  ALT 19 19  ALKPHOS 93 66  BILITOT 1.0 1.0  PROT 7.9 6.3*  ALBUMIN 3.8 3.0*   No results for input(s): LIPASE, AMYLASE in the last 168 hours. No results for input(s): AMMONIA in the last 168 hours. Coagulation Profile: Recent Labs  Lab 11/17/19 1223  INR 1.0   Cardiac Enzymes: No results for input(s): CKTOTAL, CKMB, CKMBINDEX, TROPONINI in the last 168 hours. BNP (last 3 results) No results for input(s): PROBNP in the last 8760 hours. HbA1C: Recent Labs    11/18/19 0519  HGBA1C 7.0*   CBG: Recent Labs  Lab 11/18/19 0804 11/18/19 1156 11/18/19 1709 11/18/19 2204 11/18/19 2205  GLUCAP 125* 186* 261* 201* 221*   Lipid Profile: No results for input(s): CHOL, HDL, LDLCALC, TRIG, CHOLHDL, LDLDIRECT in the last 72 hours. Thyroid Function Tests: No results for input(s): TSH, T4TOTAL, FREET4, T3FREE, THYROIDAB in the last 72 hours. Anemia Panel: Recent Labs    11/17/19 1539 11/18/19 0939  FERRITIN 262 541*   Urine analysis:    Component Value Date/Time   COLORURINE STRAW (A) 11/17/2019 1151   APPEARANCEUR CLEAR (A) 11/17/2019 1151   APPEARANCEUR CLEAR 07/09/2012 1250   LABSPEC 1.007 11/17/2019 1151   LABSPEC 1.025 07/09/2012 1250   PHURINE  6.0 11/17/2019  1151   GLUCOSEU >=500 (A) 11/17/2019 1151   GLUCOSEU NEGATIVE 07/09/2012 1250   HGBUR SMALL (A) 11/17/2019 1151   BILIRUBINUR NEGATIVE 11/17/2019 1151   BILIRUBINUR neg 06/10/2017 0900   BILIRUBINUR NEGATIVE 07/09/2012 1250   KETONESUR NEGATIVE 11/17/2019 1151   PROTEINUR NEGATIVE 11/17/2019 1151   UROBILINOGEN 0.2 06/10/2017 0900   NITRITE NEGATIVE 11/17/2019 1151   LEUKOCYTESUR NEGATIVE 11/17/2019 1151   LEUKOCYTESUR NEGATIVE 07/09/2012 1250   Sepsis Labs: !!!!!!!!!!!!!!!!!!!!!!!!!!!!!!!!!!!!!!!!!!!! (procalcitonin:4,lacticidven:4) ) Recent Results (from the past 240 hour(s))  Culture, blood (Routine x 2)     Status: None (Preliminary result)   Collection Time: 11/17/19 11:51 AM   Specimen: BLOOD  Result Value Ref Range Status   Specimen Description BLOOD LEFT ANTECUBITAL  Final   Special Requests   Final    BOTTLES DRAWN AEROBIC AND ANAEROBIC Blood Culture adequate volume   Culture   Final    NO GROWTH < 24 HOURS Performed at [facility], [address] [place], [place] [address]    Report Status PENDING  Incomplete  Culture, blood (Routine x 2)     Status: None (Preliminary result)   Collection Time: 11/17/19  1:55 PM   Specimen: BLOOD  Result Value Ref Range Status   Specimen Description BLOOD LEFT ANTECUBITAL  Final   Special Requests   Final    BOTTLES DRAWN AEROBIC AND ANAEROBIC Blood Culture adequate volume   Culture   Final    NO GROWTH < 24 HOURS Performed at [facility] [place], [address] [place], [place] [address]    Report Status PENDING  Incomplete  SARS CORONAVIRUS 2 (TAT 6-24 HRS) Nasopharyngeal Nasopharyngeal Swab     Status: Abnormal   Collection Time: 11/17/19  3:39 PM   Specimen: Nasopharyngeal Swab  Result Value Ref Range Status   SARS Coronavirus 2 POSITIVE (A) NEGATIVE Final    Comment: RESULT CALLED TO, READ BACK BY AND VERIFIED WITH: V ASHLEY,RN 0043 11/18/2019 D BRADLEY (NOTE) SARS-CoV-2 target  nucleic acids are DETECTED. The SARS-CoV-2 RNA is generally detectable in upper and lower respiratory specimens during the acute phase of infection. Positive results are indicative of active infection with SARS-CoV-2. Clinical  correlation with patient history and other diagnostic information is necessary to determine patient infection status. Positive results do  not rule out bacterial infection or co-infection with other viruses. The expected result is Negative. Fact Sheet for Patients: HairSlick.no Fact Sheet for Healthcare Providers: quierodirigir.com This test is not yet approved or cleared by the [place] FDA and  has been authorized for detection and/or diagnosis of SARS-CoV-2 by FDA under an Emergency Use Authorization (EUA). This EUA will remain  in effect (meaning this test can be used) for  the duration of the COVID-19 declaration under Section 564(b)(1) of the Act, 21 U.S.C. section 360bbb-3(b)(1), unless the authorization is terminated or revoked sooner. Performed at [facility], [address], [place], [place] [address]   Sputum culture     Status: None   Collection Time: 11/18/19  9:46 AM   Specimen: Expectorated Sputum  Result Value Ref Range Status   Specimen Description EXPECTORATED SPUTUM  Final   Special Requests NONE  Final   Sputum evaluation   Final    THIS SPECIMEN IS ACCEPTABLE FOR SPUTUM CULTURE Performed at [facility], [address] [place], [place] [address]    Report Status 11/18/2019 FINAL  Final  Culture, respiratory     Status: None (Preliminary result)  Collection Time: 11/18/19  9:46 AM  Result Value Ref Range Status   Specimen Description   Final    EXPECTORATED SPUTUM Performed at [facility], [address], [place], [place] [address]    Special Requests   Final    NONE Reflexed from [id number] Performed at [facility], [address] [place], [place] [address]    Gram Stain   Final    RARE WBC PRESENT,BOTH PMN AND MONONUCLEAR NO ORGANISMS SEEN Performed at [facility], [address], [place], [place] [address]    Culture PENDING  Incomplete   Report Status PENDING  Incomplete     Radiological Exams on Admission: Dg Chest 2 View  Result Date: 11/17/2019 CLINICAL DATA:  Suspected sepsis. Additional history provided: Shortness of breath, fever, cough. EXAM: CHEST - 2 VIEW COMPARISON:  Chest radiograph 06/05/2019 FINDINGS: Sequela of prior median sternotomy. The heart is at the upper limits of normal for size. Pulmonary vascular congestion. Superimposed upon chronic interstitial prominence, there is patchy opacity within the mid to lower right lung field suspicious for pneumonia. No evidence of pneumothorax. No sizable pleural effusion. No acute bony abnormality. IMPRESSION: Patchy airspace opacities within the mid to lower right lung field suspicious for pneumonia. Pulmonary vascular congestion. Redemonstrated chronic interstitial prominence. Electronically Signed   By: Jackey Loge DO   On: 11/17/2019 12:23   Nm Pulmonary Perfusion  Result Date: 11/18/2019 CLINICAL DATA:  COVID positive patient. Positive D-dimer. Negative Doppler ultrasound EXAM: NUCLEAR MEDICINE PERFUSION LUNG SCAN TECHNIQUE: Perfusion images were obtained in multiple projections after intravenous injection of radiopharmaceutical. RADIOPHARMACEUTICALS:  4.8 mCi Tc-49m MAA COMPARISON:  None. FINDINGS: Decreased peripheral perfusion in the RIGHT upper lobe anterolateral seen on 2 projections could represent pulmonary embolism. There is pneumonia in this region on radiograph. Decreased decreased perfusion lingular region is favored relate to pneumonia. IMPRESSION: Decreased perfusion in the RIGHT upper lobe anterior laterally with differential include pulmonary embolism versus pneumonia. These results will be called to the ordering clinician or  representative by the Radiologist Assistant, and communication documented in the PACS or zVision Dashboard. Electronically Signed   By: Genevive Bi M.D.   On: 11/18/2019 16:37   US Venous Img Lower Bilateral (dvt)  Result Date: 11/17/2019 CLINICAL DATA:  Edema with positive D-dimer EXAM: BILATERAL LOWER EXTREMITY VENOUS DOPPLER ULTRASOUND TECHNIQUE: Gray-scale sonography with graded compression, as well as color Doppler and duplex ultrasound were performed to evaluate the lower extremity deep venous systems from the level of the common femoral vein and including the common femoral, femoral, profunda femoral, popliteal and calf veins including the posterior tibial, peroneal and gastrocnemius veins when visible. The superficial great saphenous vein was also interrogated. Spectral Doppler was utilized to evaluate flow at rest and with distal augmentation maneuvers in the common femoral, femoral and popliteal veins. COMPARISON:  None. FINDINGS: RIGHT LOWER EXTREMITY Common Femoral Vein: No evidence of thrombus. Normal compressibility, respiratory phasicity and response to augmentation. Saphenofemoral Junction: No evidence of thrombus. Normal compressibility and flow on color Doppler imaging. Profunda Femoral Vein: No evidence of thrombus. Normal compressibility and flow on color Doppler imaging. Femoral Vein: No evidence of thrombus. Normal compressibility, respiratory phasicity and response to augmentation. Popliteal Vein: No evidence of thrombus. Normal compressibility, respiratory phasicity and response to augmentation. Calf Veins: No evidence of thrombus. Normal compressibility and flow on color Doppler imaging. LEFT LOWER EXTREMITY Common Femoral Vein: No evidence of thrombus. Normal compressibility, respiratory phasicity and response to augmentation. Saphenofemoral  Junction: No evidence of thrombus. Normal compressibility and flow on color Doppler imaging. Profunda Femoral Vein: No evidence of thrombus.  Normal compressibility and flow on color Doppler imaging. Femoral Vein: No evidence of thrombus. Normal compressibility, respiratory phasicity and response to augmentation. Popliteal Vein: No evidence of thrombus. Normal compressibility, respiratory phasicity and response to augmentation. Calf Veins: No evidence of thrombus. Normal compressibility and flow on color Doppler imaging. IMPRESSION: No evidence of deep venous thrombosis in either lower extremity. Electronically Signed   By: Jasmine Pang M.D.   On: 11/17/2019 21:02    Old chart review Chest x-ray reviewed opacity right lung  Assessment/Plan 68 year old male with acute respiratory failure secondary to Covid pneumonia Principal Problem:   Pneumonia due to COVID-19 virus continue remdesivir and Decadron.  Will empirically continue to cover due to elevated procalcitonin level with Rocephin and azithromycin blood cultures are negative thus far.  Patient has negative DVT ultrasound studies with intermediate VQ scan however this is consistent with COVID-19 positive test (U07.1, COVID-19) with Acute Pneumonia (J12.89, Other viral pneumonia) (If respiratory failure or sepsis present, add as separate assessment).  Monitor daily inflammatory markers.  Placed on Lovenox 0.5 mg every 12 hours and stop heparin drip at this time.  Also placed on vitamin C and zinc.  . Active Problems:   Acute respiratory failure due to COVID-19 (HCC)-as above   Coronary artery disease involving native coronary artery of native heart with angina pectoris (HCC)-stable   Essential hypertension-clarify home meds and resume   CHF (congestive heart failure) (HCC)-currently euvolemic and compensated   Chronic obstructive pulmonary disease (HCC)-lungs clear at this time     DVT prophylaxis: Lovenox Code Status: Full Family Communication: None Disposition Plan: Days Consults called: None Admission status: Admission   Kynnedy Carreno A MD [facility]  If  7PM-7AM, please contact night-coverage [email or web address] Password [email or web address]  11/19/2019, 6:18 AM

## 2019-11-19 NOTE — Progress Notes (Addendum)
PROGRESS NOTE    Gary Gutierrez  OXB:[id number] DOB: 01-29-1951 DOA: 11/19/2019 PCP: [name], MD    Brief Narrative:  68 year old male who presented with dyspnea.  He does have significant past medical history for systolic heart failure, coronary artery disease, type 2 diabetes mellitus, hypertension, chronic kidney disease and dyslipidemia.  Patient presented to [facility] with dyspnea, cough, fevers and chills for about 48 hours.  On his initial physical examination his blood pressure was 97/62, pulse rate 91, respiratory 25, temperature 99.4, oxygen saturation 93% on supplemental oxygen, he had increased work of breathing, decreased breath sounds bilaterally, positive Rales but no wheezing, heart S1-S2 present, rhythmic, abdomen soft nontender, no lower extremity edema. 135, potassium 3.5, chloride 85, bicarb 28, glucose 182, BUN 36, creatinine 1.57, white count 6.0, hemoglobin 15.7, hematocrit 45.8, platelets 140.  SARS COVID-19 was positive.  Urine analysis was negative for infection.  This radiograph had a reticular nodular infiltrate in the lower zone of the right upper lobe, left lower lobe interstitial infiltrate (personally reviewed).  EKG 108 bpm, normal axis, normal intervals, sinus rhythm, J-point elevation V1 through V3, poor R wave progression, negative T waves in lead II, III and aVF.  Patient was admitted to the hospital working diagnosis of right upper lobe community-acquired pneumonia.  Received antibiotic therapy with ceftriaxone and azithromycin, he was admitted under investigation for COVID-19 infection, his PCR for COVID-19 returned positive and he has been transferred to [facility] for further treatment.  . He has developed acute hypoxic respiratory failure with oxygen saturation down to 88%, requiring supplemental oxygen per nasal cannula up to 4 L/min.  Assessment & Plan:   Principal Problem:   Pneumonia due to COVID-19  virus Active Problems:   Coronary artery disease involving native coronary artery of native heart with angina pectoris (HCC)   Essential hypertension   CHF (congestive heart failure) (HCC)   Chronic obstructive pulmonary disease (HCC)   Acute respiratory failure due to COVID-19 (HCC)   1.  Acute hypoxic respiratory failure due to SARS COVID-19 viral pneumonia.  RR: 14  Pulse oxymetry: 96%  Fi02: 4 LPM per [place]   COVID-19 Labs  Recent Labs    11/17/19 1539 11/17/19 1615 11/18/19 0939  FERRITIN 262  --  541*  CRP  --  10.2* 13.6*    Lab Results  Component Value Date   SARSCOV2NAA POSITIVE (A) 11/17/2019   SARSCOV2NAA NEGATIVE 06/05/2019   Patient with persistent elevation in his inflammatory markers but clinically his symptoms are improving.   Continue medical therapy with Remdesivir #2/5, continue with systemic corticosteroids with dexamethasone. On antitussive agents, zinc and vitamin C. Continue with airway clearing techniques with flutter valve and incentive spirometer. Out of bed as tolerated and physical therapy evaluation.   Patient with dense infiltrate reticulonodular at the base of the right upper lobe, elevated procalcitonin, possible bacterial super infection, will continue ceftriaxone/ azithromycin for complete 5 days.    2. Systolic heart failure. No signs of acute decompensation, will continue telemetry monitoring, holding diuretic therapy for now. Further work up with echocardiography showed reduced LV systolic function down to 25 to 30%. Positive PVC on the monitor. Will keep K at 4 and Mg at 2.   3. T2DM with steroid induced hyperglycemia. Dyslipidemia. Will continue glucose cover and monitoring with insulin sliding scale. Patient is tolerating po well. Continue statin therapy.   4. CKD stage 3a. Renal function stable, will continue K correction with Kcl,  hold on diuretic therapy for now.   5. Decreased perfusion on right upper lobe anteriorly, possible PE vs  changes related to pneumonia. Clinically correlates to site of pneumonic infiltrate, will order [place] lower extremities and will call radiology for second review, in my opinion abnormal defect in perfusion due to pneumonia. Echocardiogram is negative for RV overload. For now will continue current dose of enoxaparin.   DVT prophylaxis: enoxaparin   Code Status: full Family Communication: I spoke over the phone with the patient's wife about patient's  condition, plan of care and all questions were addressed. Disposition Plan/ discharge barriers: pending clinical improvement.   Body mass index is 27.07 kg/m. Malnutrition Type:      Malnutrition Characteristics:      Nutrition Interventions:     RN Pressure Injury Documentation:     Consultants:     Procedures:     Antimicrobials:       Subjective: Patient continue to improve symptoms, but not yet back to baseline, positive dyspnea.   Objective: Vitals:   11/19/19 0620 11/19/19 0621 11/19/19 0624 11/19/19 0752  BP:    (!) 89/78  Pulse:      Resp:  18  14  Temp:      TempSrc:      SpO2: (!) 88%  (!) 88% 96%  Weight:      Height:        Intake/Output Summary (Last 24 hours) at 11/19/2019 0922 Last data filed at 11/19/2019 0500 Gross per 24 hour  Intake 200 ml  Output 350 ml  Net -150 ml   Filed Weights   11/19/19 0444 11/19/19 0445  Weight: 83.2 kg 83.2 kg    Examination:   General: Not in pain.  Neurology: Awake and alert, non focal  E ENT: no pallor, no icterus, oral mucosa moist Cardiovascular: No JVD. S1-S2 present, rhythmic, no gallops, rubs, or murmurs. No lower extremity edema. Pulmonary: positive breath sounds bilaterally. Gastrointestinal. Abdomen with no organomegaly, non tender, no rebound or guarding Skin. No rashes Musculoskeletal: no joint deformities     Data Reviewed: I have personally reviewed following labs and imaging studies  CBC: Recent Labs  Lab 11/17/19 1151 11/18/19  0519  WBC 6.0 6.1  NEUTROABS 5.1  --   HGB 15.7 13.2  HCT 45.8 39.5  MCV 91.2 91.9  PLT 140* 846*   Basic Metabolic Panel: Recent Labs  Lab 11/17/19 1151 11/18/19 0519  NA 135 133*  K 3.5 3.4*  CL 85* 90*  CO2 28 28  GLUCOSE 182* 123*  BUN 36* 39*  CREATININE 1.57* 1.53*  CALCIUM 8.4* 7.6*   GFR: Estimated Creatinine Clearance: 45.6 mL/min (A) (by C-G formula based on SCr of 1.53 mg/dL (H)). Liver Function Tests: Recent Labs  Lab 11/17/19 1151 11/18/19 0519  AST 28 31  ALT 19 19  ALKPHOS 93 66  BILITOT 1.0 1.0  PROT 7.9 6.3*  ALBUMIN 3.8 3.0*   No results for input(s): LIPASE, AMYLASE in the last 168 hours. No results for input(s): AMMONIA in the last 168 hours. Coagulation Profile: Recent Labs  Lab 11/17/19 1223  INR 1.0   Cardiac Enzymes: No results for input(s): CKTOTAL, CKMB, CKMBINDEX, TROPONINI in the last 168 hours. BNP (last 3 results) No results for input(s): PROBNP in the last 8760 hours. HbA1C: Recent Labs    11/18/19 0519  HGBA1C 7.0*   CBG: Recent Labs  Lab 11/18/19 1156 11/18/19 1709 11/18/19 2204 11/18/19 2205 11/19/19 0734  GLUCAP 186*  261* 201* 221* 164*   Lipid Profile: No results for input(s): CHOL, HDL, LDLCALC, TRIG, CHOLHDL, LDLDIRECT in the last 72 hours. Thyroid Function Tests: No results for input(s): TSH, T4TOTAL, FREET4, T3FREE, THYROIDAB in the last 72 hours. Anemia Panel: Recent Labs    11/17/19 1539 11/18/19 0939  FERRITIN 262 541*      Radiology Studies: I have reviewed all of the imaging during this hospital visit personally     Scheduled Meds: . dexamethasone (DECADRON) injection  6 mg Intravenous Q24H  . enoxaparin (LOVENOX) injection  0.5 mg/kg Subcutaneous Q12H  . insulin aspart  0-6 Units Subcutaneous TID WC  . sodium chloride flush  3 mL Intravenous Q12H  . vitamin C  500 mg Oral Daily  . zinc sulfate  220 mg Oral Daily   Continuous Infusions: . sodium chloride    . azithromycin    .  [START ON 11/20/2019] cefTRIAXone (ROCEPHIN)  IV    . remdesivir 100 mg in NS 250 mL 100 mg (11/19/19 0914)     LOS: 0 days         Annett Gulaaniel , MD

## 2019-11-19 NOTE — Plan of Care (Signed)
  Problem: Respiratory: Goal: Will maintain a patent airway Outcome: Progressing   Problem: Respiratory: Goal: Complications related to the disease process, condition or treatment will be avoided or minimized Outcome: Progressing   

## 2019-11-19 NOTE — Plan of Care (Addendum)
Patient desaturates to 70% when lying on his side. Kept on 28% Fi02 via nasal cannula.  Problem: Education: Goal: Knowledge of risk factors and measures for prevention of condition will improve 11/19/2019 0453 by Val Eagle, RN Outcome: Progressing 11/19/2019 0439 by Val Eagle, RN Outcome: Progressing   Problem: Coping: Goal: Psychosocial and spiritual needs will be supported 11/19/2019 0453 by Val Eagle, RN Outcome: Progressing 11/19/2019 0439 by Val Eagle, RN Outcome: Progressing   Problem: Respiratory: Goal: Will maintain a patent airway 11/19/2019 0453 by Val Eagle, RN Outcome: Progressing 11/19/2019 0439 by Val Eagle, RN Outcome: Progressing Goal: Complications related to the disease process, condition or treatment will be avoided or minimized 11/19/2019 0453 by Val Eagle, RN Outcome: Progressing 11/19/2019 0439 by Val Eagle, RN Outcome: Progressing

## 2019-11-19 NOTE — Progress Notes (Signed)
Pharmacy Note - Remdesivir Dosing  O:  ALT: 19  CXR:  Patchy opacities w/in mid-to-lower Rt lung field suspicious for PNA Requiring supplemental O2:  SpO2 98% on 2L per [place]   A/P:  Patient meets criteria for remdesivir.  Begin remdesivir 200 mg IV x 1, followed by 100 mg IV daily x 4 days  Monitor ALT, clinical progress  Despina Pole, Pharm. D. Clinical Pharmacist 11/19/2019 4:20 AM

## 2019-11-19 NOTE — ED Notes (Signed)
EMTALA and Medical necessity documentation reviewed at thsi time and found to be complete per policy.

## 2019-11-19 NOTE — Plan of Care (Signed)
  Problem: Respiratory: Goal: Will maintain a patent airway 11/19/2019 0735 by Iver Nestle, RN Outcome: Progressing 11/19/2019 0728 by Iver Nestle, RN Outcome: Progressing   Problem: Respiratory: Goal: Complications related to the disease process, condition or treatment will be avoided or minimized 11/19/2019 0735 by Iver Nestle, RN Outcome: Progressing 11/19/2019 0728 by Iver Nestle, RN Outcome: Progressing

## 2019-11-19 NOTE — ED Notes (Signed)
Pt ambulated to bathroom, increased work of breathing when returned to bed.  Pt instructed to use urinal.  O2 changed to 4L.  Will re-assess and adjust accordingly.

## 2019-11-19 NOTE — Progress Notes (Signed)
Pharmacy Note:   heparin infusion discontinuation   -This 68 yo male was started on a heparin infusion at [facility] for a  D-Dimer 2141.96  and was therapeutic x2 @ 1100units/hr  And VQ scan intermediate suspicion  for PE   -Admitting physician ordered prophylactic Lovenox  -Confirmed with physician that treatment dose Lovenox wasn't required and heparin infusion not to be re-started.  Despina Pole, Pharm. D. Clinical Pharmacist 11/19/2019 7:12 AM

## 2019-11-19 NOTE — Progress Notes (Signed)
Patient upset about tele monitor alarming throughout his admission. Rationale explained to patient why he is on a cardiac monitor. Patient acknowledges rationale for tele monitor. All leads and cords exchanged. MD paged about patient concerns.

## 2019-11-19 NOTE — Progress Notes (Signed)
Heparin stopped on this admission as per hospitalist. Lovenox prescribed.

## 2019-11-20 ENCOUNTER — Encounter (HOSPITAL_COMMUNITY): Payer: Medicare Other

## 2019-11-20 ENCOUNTER — Inpatient Hospital Stay (HOSPITAL_COMMUNITY): Payer: Medicare Other

## 2019-11-20 DIAGNOSIS — I5022 Chronic systolic (congestive) heart failure: Secondary | ICD-10-CM | POA: Diagnosis not present

## 2019-11-20 DIAGNOSIS — I10 Essential (primary) hypertension: Secondary | ICD-10-CM | POA: Diagnosis not present

## 2019-11-20 DIAGNOSIS — J449 Chronic obstructive pulmonary disease, unspecified: Secondary | ICD-10-CM | POA: Diagnosis not present

## 2019-11-20 DIAGNOSIS — U071 COVID-19: Secondary | ICD-10-CM | POA: Diagnosis not present

## 2019-11-20 LAB — COMPREHENSIVE METABOLIC PANEL
ALT: 25 U/L (ref 0–44)
AST: 32 U/L (ref 15–41)
Albumin: 3.2 g/dL — ABNORMAL LOW (ref 3.5–5.0)
Alkaline Phosphatase: 90 U/L (ref 38–126)
Anion gap: 11 (ref 5–15)
BUN: 50 mg/dL — ABNORMAL HIGH (ref 8–23)
CO2: 26 mmol/L (ref 22–32)
Calcium: 8.3 mg/dL — ABNORMAL LOW (ref 8.9–10.3)
Chloride: 90 mmol/L — ABNORMAL LOW (ref 98–111)
Creatinine, Ser: 1.36 mg/dL — ABNORMAL HIGH (ref 0.61–1.24)
GFR calc Af Amer: >60 mL/min (ref >60)
GFR calc non Af Amer: 53 mL/min — ABNORMAL LOW (ref >60)
Glucose, Bld: 240 mg/dL — ABNORMAL HIGH (ref 70–99)
Potassium: 4.1 mmol/L (ref 3.5–5.1)
Sodium: 127 mmol/L — ABNORMAL LOW (ref 135–145)
Total Bilirubin: 0.9 mg/dL (ref 0.3–1.2)
Total Protein: 6.8 g/dL (ref 6.5–8.1)

## 2019-11-20 LAB — GLUCOSE, CAPILLARY
Glucose-Capillary: 195 mg/dL — ABNORMAL HIGH (ref 70–99)
Glucose-Capillary: 320 mg/dL — ABNORMAL HIGH (ref 70–99)
Glucose-Capillary: 363 mg/dL — ABNORMAL HIGH (ref 70–99)

## 2019-11-20 LAB — CBC WITH DIFFERENTIAL/PLATELET
Abs Immature Granulocytes: 0.05 10*3/uL (ref 0.00–0.07)
Basophils Absolute: 0 10*3/uL (ref 0.0–0.1)
Basophils Relative: 0 %
Eosinophils Absolute: 0 10*3/uL (ref 0.0–0.5)
Eosinophils Relative: 0 %
HCT: 44 % (ref 39.0–52.0)
Hemoglobin: 14.6 g/dL (ref 13.0–17.0)
Immature Granulocytes: 1 %
Lymphocytes Relative: 7 %
Lymphs Abs: 0.6 10*3/uL — ABNORMAL LOW (ref 0.7–4.0)
MCH: 30.7 pg (ref 26.0–34.0)
MCHC: 33.2 g/dL (ref 30.0–36.0)
MCV: 92.6 fL (ref 80.0–100.0)
Monocytes Absolute: 0.6 10*3/uL (ref 0.1–1.0)
Monocytes Relative: 7 %
Neutro Abs: 7.2 10*3/uL (ref 1.7–7.7)
Neutrophils Relative %: 85 %
Platelets: 146 10*3/uL — ABNORMAL LOW (ref 150–400)
RBC: 4.75 MIL/uL (ref 4.22–5.81)
RDW: 15.3 % (ref 11.5–15.5)
WBC: 8.5 10*3/uL (ref 4.0–10.5)
nRBC: 0 % (ref 0.0–0.2)

## 2019-11-20 LAB — C-REACTIVE PROTEIN: CRP: 10.2 mg/dL — ABNORMAL HIGH (ref <1.0)

## 2019-11-20 LAB — PROCALCITONIN: Procalcitonin: 0.72 ng/mL

## 2019-11-20 LAB — CULTURE, RESPIRATORY W GRAM STAIN: Culture: NORMAL

## 2019-11-20 LAB — D-DIMER, QUANTITATIVE: D-Dimer, Quant: 1.88 ug/mL-FEU — ABNORMAL HIGH (ref 0.00–0.50)

## 2019-11-20 MED ORDER — CLOPIDOGREL BISULFATE 75 MG PO TABS
75.0000 mg | ORAL_TABLET | Freq: Every day | ORAL | Status: DC
  Administered 2019-11-20 – 2019-11-22 (×3): 75 mg via ORAL
  Filled 2019-11-20 (×3): qty 1

## 2019-11-20 MED ORDER — ASPIRIN EC 81 MG PO TBEC
81.0000 mg | DELAYED_RELEASE_TABLET | Freq: Every day | ORAL | Status: DC
  Administered 2019-11-20 – 2019-11-22 (×3): 81 mg via ORAL
  Filled 2019-11-20 (×3): qty 1

## 2019-11-20 MED ORDER — IOHEXOL 350 MG/ML SOLN
75.0000 mL | Freq: Once | INTRAVENOUS | Status: AC | PRN
  Administered 2019-11-20: 10:00:00 75 mL via INTRAVENOUS

## 2019-11-20 MED ORDER — SODIUM CHLORIDE 0.9 % IV SOLN
INTRAVENOUS | Status: AC
  Administered 2019-11-20: 11:00:00 via INTRAVENOUS

## 2019-11-20 MED ORDER — ENOXAPARIN SODIUM 40 MG/0.4ML ~~LOC~~ SOLN
40.0000 mg | SUBCUTANEOUS | Status: DC
  Administered 2019-11-21 – 2019-11-22 (×2): 40 mg via SUBCUTANEOUS
  Filled 2019-11-20 (×2): qty 0.4

## 2019-11-20 NOTE — Progress Notes (Signed)
PROGRESS NOTE    Roney MarionJames W Mazurowski  ZOX:[id number] DOB: 1951-12-19 DOA: 11/19/2019 PCP: Reubin MilanBerglund, [name], MD    Brief Narrative:  68 year old male who presented with dyspnea.  He does have significant past medical history for systolic heart failure, coronary artery disease, type 2 diabetes mellitus, hypertension, chronic kidney disease and dyslipidemia.  Patient presented to [facility] with dyspnea, cough, fevers and chills for about 48 hours.  On his initial physical examination his blood pressure was 97/62, pulse rate 91, respiratory 25, temperature 99.4, oxygen saturation 93% on supplemental oxygen, he had increased work of breathing, decreased breath sounds bilaterally, positive Rales but no wheezing, heart S1-S2 present, rhythmic, abdomen soft nontender, no lower extremity edema. 135, potassium 3.5, chloride 85, bicarb 28, glucose 182, BUN 36, creatinine 1.57, white count 6.0, hemoglobin 15.7, hematocrit 45.8, platelets 140.  SARS COVID-19 was positive.  Urine analysis was negative for infection.  This radiograph had a reticular nodular infiltrate in the lower zone of the right upper lobe, left lower lobe interstitial infiltrate (personally reviewed).  EKG 108 bpm, normal axis, normal intervals, sinus rhythm, J-point elevation V1 through V3, poor R wave progression, negative T waves in lead II, III and aVF.  Patient was admitted to the hospital working diagnosis of right upper lobe community-acquired pneumonia.  Received antibiotic therapy with ceftriaxone and azithromycin, he was admitted under investigation for COVID-19 infection, his PCR for COVID-19 returned positive and [facility]  . He has developed acute hypoxic respiratory failure with oxygen saturation down to 88%, requiring supplemental oxygen per nasal cannula up to 4 L/min.    Assessment & Plan:   Principal Problem:   Pneumonia due to  COVID-19 virus Active Problems:   Coronary artery disease involving native coronary artery of native heart with angina pectoris (HCC)   Essential hypertension   CHF (congestive heart failure) (HCC)   Chronic obstructive pulmonary disease (HCC)   Acute respiratory failure due to COVID-19 (HCC)   1.  Acute hypoxic respiratory failure due to SARS COVID-19 viral pneumonia.  RR: 18  Pulse oxymetry: 95%  Fi02: 21% room air.  COVID-19 Labs  Recent Labs    11/17/19 1539  11/18/19 0939 11/19/19 0425 11/20/19 0040  DDIMER  --   --   --  1.57* 1.88*  FERRITIN 262  --  541*  --   --   CRP  --    < > 13.6* 14.6* 10.2*   < > = values in this interval not displayed.    Lab Results  Component Value Date   SARSCOV2NAA POSITIVE (A) 11/17/2019   SARSCOV2NAA NEGATIVE 06/05/2019   Trending down inflammatory markers.    Tolerating well medical therapy with  Remdesivir 32/5, and systemic corticosteroids with dexamethasone. Continue with antitussive agents, zinc and vitamin C. Airway clearing techniques with flutter valve and incentive spirometer.    CT chest personally reviewed, noted bilateral ground glass opacities, consistent with viral pneumonia, no air bronchogram or dense consolidation, will dc antibiotic therapy, bacterial co-infection ruled out.   2. Systolic heart failure.  LV systolic function down to 25 to 30%. Clinically hypovolemic, will add 1 L ns at 50 ml per H of isotonic saline.   3. T2DM with steroid induced hyperglycemia. Dyslipidemia. Fasting glucose is 240 with capillary this am 195, will continue glucose cover and monitoring with insulin sliding scale, for now.  Continue with statin therapy.  4. CKD stage 3a with  hyponatremia/ hypokalemia. Stale renal function with serum cr at 1,36, K corrected at 4,1 and serum bicarbonate at 26. Clinically hypovolemic, will continue to hold on diuretic therapy and will add gentle hydration with isotonic saline at 50 ml per h for one  liter.   5. Decreased perfusion on right upper lobe anteriorly, possible PE vs changes related to pneumonia. [place] negative for DVT lower extremities, chest CT negative for PE. Will reduce dose of enoxaparin to 40 mg daily for prophylaxis.   6. CAD sp stent (2018). Will resume asa and clopidogrel per her home regimen,   DVT prophylaxis: enoxaparin   Code Status: full Family Communication: no family at the bedside. Disposition Plan/ discharge barriers: pending clinical improvement.    Body mass index is 27.07 kg/m. Malnutrition Type:      Malnutrition Characteristics:      Nutrition Interventions:     RN Pressure Injury Documentation:     Consultants:     Procedures:     Antimicrobials:       Subjective: Patient's symptoms continue to improve, not yet back to baseline, no nausea or vomiting, no chest pain, positive po intake.   Objective: Vitals:   11/19/19 0752 11/19/19 1700 11/19/19 2033 11/20/19 0452  BP: (!) 89/78 111/78 108/72 95/66  Pulse: 80 (!) 120 90 87  Resp: 14 (!) 22 (!) 26 18  Temp: 97.9 F (36.6 C) 97.6 F (36.4 C) 98.7 F (37.1 C) 97.6 F (36.4 C)  TempSrc: Oral Axillary Axillary Axillary  SpO2: 96% 94% 94% 95%  Weight:      Height:        Intake/Output Summary (Last 24 hours) at 11/20/2019 0825 Last data filed at 11/20/2019 0701 Gross per 24 hour  Intake 1290.07 ml  Output 3800 ml  Net -2509.93 ml   Filed Weights   11/19/19 0444 11/19/19 0445  Weight: 83.2 kg 83.2 kg    Examination:   General: Not in pain or dyspnea. Deconditioned  Neurology: Awake and alert, non focal  E ENT: no pallor, no icterus, oral mucosa moist Cardiovascular: No JVD. S1-S2 present, rhythmic, no gallops, rubs, or murmurs. No lower extremity edema. Pulmonary: positive breath sounds bilaterally. Gastrointestinal. Abdomen with no organomegaly, non tender, no rebound or guarding Skin. No rashes Musculoskeletal: no joint deformities     Data  Reviewed: I have personally reviewed following labs and imaging studies  CBC: Recent Labs  Lab 11/17/19 1151 11/18/19 0519 11/19/19 0425 11/20/19 0040  WBC 6.0 6.1 5.6 8.5  NEUTROABS 5.1  --  4.5 7.2  HGB 15.7 13.2 13.8 14.6  HCT 45.8 39.5 41.0 44.0  MCV 91.2 91.9 92.1 92.6  PLT 140* 121* 133* 952*   Basic Metabolic Panel: Recent Labs  Lab 11/17/19 1151 11/18/19 0519 11/19/19 0425 11/20/19 0040  NA 135 133* 137 127*  K 3.5 3.4* 3.0* 4.1  CL 85* 90* 96* 90*  CO2 28 28 25 26   GLUCOSE 182* 123* 166* 240*  BUN 36* 39* 45* 50*  CREATININE 1.57* 1.53* 1.29* 1.36*  CALCIUM 8.4* 7.6* 7.8* 8.3*  MG  --   --  2.2  --    GFR: Estimated Creatinine Clearance: 51.3 mL/min (A) (by C-G formula based on SCr of 1.36 mg/dL (H)). Liver Function Tests: Recent Labs  Lab 11/17/19 1151 11/18/19 0519 11/19/19 0425 11/20/19 0040  AST 28 31 36 32  ALT 19 19 23 25   ALKPHOS 93 66 80 90  BILITOT 1.0 1.0 1.2 0.9  PROT 7.9  6.3* 6.8 6.8  ALBUMIN 3.8 3.0* 3.4* 3.2*   No results for input(s): LIPASE, AMYLASE in the last 168 hours. No results for input(s): AMMONIA in the last 168 hours. Coagulation Profile: Recent Labs  Lab 11/17/19 1223  INR 1.0   Cardiac Enzymes: No results for input(s): CKTOTAL, CKMB, CKMBINDEX, TROPONINI in the last 168 hours. BNP (last 3 results) No results for input(s): PROBNP in the last 8760 hours. HbA1C: Recent Labs    11/18/19 0519  HGBA1C 7.0*   CBG: Recent Labs  Lab 11/18/19 2204 11/18/19 2205 11/19/19 0734 11/19/19 1208 11/19/19 2209  GLUCAP 201* 221* 164* 301* 281*   Lipid Profile: No results for input(s): CHOL, HDL, LDLCALC, TRIG, CHOLHDL, LDLDIRECT in the last 72 hours. Thyroid Function Tests: No results for input(s): TSH, T4TOTAL, FREET4, T3FREE, THYROIDAB in the last 72 hours. Anemia Panel: Recent Labs    11/17/19 1539 11/18/19 0939  FERRITIN 262 541*      Radiology Studies: I have reviewed all of the imaging during this  hospital visit personally     Scheduled Meds: . azithromycin  500 mg Oral Daily  . chlorpheniramine-HYDROcodone  5 mL Oral Q12H  . dexamethasone (DECADRON) injection  6 mg Intravenous Q24H  . enoxaparin (LOVENOX) injection  0.5 mg/kg Subcutaneous Q12H  . insulin aspart  0-6 Units Subcutaneous TID WC  . sodium chloride flush  3 mL Intravenous Q12H  . vitamin C  500 mg Oral Daily  . zinc sulfate  220 mg Oral Daily   Continuous Infusions: . sodium chloride    . cefTRIAXone (ROCEPHIN)  IV Stopped (11/20/19 0112)  . remdesivir 100 mg in NS 250 mL Stopped (11/19/19 0947)     LOS: 1 day        Mauricio Annett Gula, MD

## 2019-11-20 NOTE — Plan of Care (Signed)
  Problem: Education: Goal: Knowledge of risk factors and measures for prevention of condition will improve Outcome: Progressing   Problem: Respiratory: Goal: Will maintain a patent airway Outcome: Progressing   Problem: Safety: Goal: Ability to remain free from injury will improve Outcome: Progressing   

## 2019-11-21 DIAGNOSIS — I5022 Chronic systolic (congestive) heart failure: Secondary | ICD-10-CM | POA: Diagnosis not present

## 2019-11-21 DIAGNOSIS — I10 Essential (primary) hypertension: Secondary | ICD-10-CM | POA: Diagnosis not present

## 2019-11-21 DIAGNOSIS — J449 Chronic obstructive pulmonary disease, unspecified: Secondary | ICD-10-CM | POA: Diagnosis not present

## 2019-11-21 DIAGNOSIS — U071 COVID-19: Secondary | ICD-10-CM | POA: Diagnosis not present

## 2019-11-21 LAB — COMPREHENSIVE METABOLIC PANEL
ALT: 25 U/L (ref 0–44)
AST: 26 U/L (ref 15–41)
Albumin: 2.9 g/dL — ABNORMAL LOW (ref 3.5–5.0)
Alkaline Phosphatase: 95 U/L (ref 38–126)
Anion gap: 11 (ref 5–15)
BUN: 41 mg/dL — ABNORMAL HIGH (ref 8–23)
CO2: 26 mmol/L (ref 22–32)
Calcium: 8.8 mg/dL — ABNORMAL LOW (ref 8.9–10.3)
Chloride: 101 mmol/L (ref 98–111)
Creatinine, Ser: 1 mg/dL (ref 0.61–1.24)
GFR calc Af Amer: >60 mL/min (ref >60)
GFR calc non Af Amer: >60 mL/min (ref >60)
Glucose, Bld: 244 mg/dL — ABNORMAL HIGH (ref 70–99)
Potassium: 5.1 mmol/L (ref 3.5–5.1)
Sodium: 138 mmol/L (ref 135–145)
Total Bilirubin: 0.8 mg/dL (ref 0.3–1.2)
Total Protein: 6.1 g/dL — ABNORMAL LOW (ref 6.5–8.1)

## 2019-11-21 LAB — GLUCOSE, CAPILLARY
Glucose-Capillary: 182 mg/dL — ABNORMAL HIGH (ref 70–99)
Glucose-Capillary: 241 mg/dL — ABNORMAL HIGH (ref 70–99)
Glucose-Capillary: 333 mg/dL — ABNORMAL HIGH (ref 70–99)
Glucose-Capillary: 369 mg/dL — ABNORMAL HIGH (ref 70–99)

## 2019-11-21 LAB — FERRITIN: Ferritin: 344 ng/mL — ABNORMAL HIGH (ref 24–336)

## 2019-11-21 LAB — D-DIMER, QUANTITATIVE: D-Dimer, Quant: 2.3 ug/mL-FEU — ABNORMAL HIGH (ref 0.00–0.50)

## 2019-11-21 LAB — C-REACTIVE PROTEIN: CRP: 5 mg/dL — ABNORMAL HIGH (ref <1.0)

## 2019-11-21 NOTE — Plan of Care (Signed)
  Problem: Education: Goal: Knowledge of risk factors and measures for prevention of condition will improve Outcome: Progressing   Problem: Coping: Goal: Psychosocial and spiritual needs will be supported Outcome: Progressing   Problem: Clinical Measurements: Goal: Will remain free from infection Outcome: Progressing   Problem: Safety: Goal: Ability to remain free from injury will improve Outcome: Progressing

## 2019-11-21 NOTE — Progress Notes (Signed)
PROGRESS NOTE    Gary Gutierrez  KGU:[id number] DOB: 04-28-1951 DOA: 11/19/2019 PCP: [name], MD    Brief Narrative:  68 year old male who presented with dyspnea. He does have significant past medical history for systolic heart failure, coronary artery disease, type 2 diabetes mellitus, hypertension, chronic kidney disease and dyslipidemia.  Patient presented to [facility] with dyspnea, cough, fevers and chills for about 48 hours.On his initial physical examination his blood pressure was 97/62, pulse rate 91, respiratory 25, temperature 99.4, oxygen saturation 93% on supplemental oxygen, he had increased work of breathing, decreased breath sounds bilaterally, positive Rales but no wheezing, heart S1-S2 present, rhythmic, abdomen soft nontender, no lower extremity edema. 135, potassium 3.5, chloride 85, bicarb 28, glucose 182, BUN 36, creatinine 1.57, white count 6.0, hemoglobin 15.7, hematocrit 45.8, platelets 140.SARS COVID-19 was positive. Urine analysis was negative for infection. This radiograph had a reticular nodular infiltrate in the lower zone of the right upper lobe, left lower lobe interstitial infiltrate (personally reviewed).EKG 108 bpm, normal axis, normal intervals, sinus rhythm, J-point elevation V1 through V3, poor R wave progression, negative T waves in lead II, III and aVF.  Patient was admitted to the hospital working diagnosis of right upper lobe community-acquired pneumonia.  Received antibiotic therapy with ceftriaxone and azithromycin, he was admitted under investigation for COVID-19 infection, his PCR for COVID-19 returned positive and he has been transferred to [facility] for further treatment. . He has developed acute hypoxic respiratory failure with oxygen saturation down to 88%, requiring supplemental oxygen per nasal cannula up to 4 L/min.  Patient responding well to medical therapy, decreased oxygen  requirements, follow up CT chest with no PE, positive ground glass opacities bilaterally. Clinically ruled out bacterial pneumonia, discontinue antibiotic therapy.   Assessment & Plan:   Principal Problem:   Pneumonia due to COVID-19 virus Active Problems:   Coronary artery disease involving native coronary artery of native heart with angina pectoris (HCC)   Essential hypertension   CHF (congestive heart failure) (HCC)   Chronic obstructive pulmonary disease (HCC)   Acute respiratory failure due to COVID-19 (HCC)   1.Acute hypoxic respiratory failure due to SARS COVID-19 viral pneumonia.  RR: 18  Pulse oxymetry: 95%  Fi02: 21% room air.  COVID-19 Labs  Recent Labs    11/18/19 0939 11/19/19 0425 11/20/19 0040 11/21/19 0035  DDIMER  --  1.57* 1.88* 2.30*  FERRITIN 541*  --   --  344*  CRP 13.6* 14.6* 10.2* 5.0*    Lab Results  Component Value Date   SARSCOV2NAA POSITIVE (A) 11/17/2019   SARSCOV2NAA NEGATIVE 06/05/2019     Continue to trend down inflammatory markers.    Tolerating well medical therapy with  Remdesivir #4/5, and systemic corticosteroids with dexamethasone 6 mg IV q 24. On antitussive agents, zinc and vitamin C. Continue with airway clearing techniques with flutter valve and incentive spirometer.    No features of bacterial pneumonia, will discontinue antibiotic therapy for now.    2. Systolic heart failure.  LV systolic function down to 25 to 30%. Patient tolerated well 1 L of isotonic saline, will continue blood pressure monitoring and holding furosemide for now.  3. T2DM with steroid induced hyperglycemia. Dyslipidemia. Today's fasting glucose is 244. Plan to dc steroids in am, continue insulin sliding scale for glucose cover and monitoring.   On statin therapy.  4. CKD stage 3a with hyponatremia/ hypokalemia.  Continue to improve renal function with serum cr today at  1,0 with K at 5,1 and serum bicarbonate at 26, will continue to follow up  renal function in am. Continue to hold on furosemide for now.   5. Decreased perfusion on right upper lobe anteriorly, possible PE vs changes related to pneumonia. [place] negative for DVT lower extremities, chest CT negative for PE.  Ruled out DVT. Continue prophylactic enoxaparin.   6. CAD sp stent (2018).  Continue with and asa clopidogrel, with good toleration.  DVT prophylaxis:enoxaparin Code Status:full Family Communication:no family at the bedside. Disposition Plan/ discharge barriers:plan for possible dc in am.   Body mass index is 27.07 kg/m. Malnutrition Type:      Malnutrition Characteristics:      Nutrition Interventions:     RN Pressure Injury Documentation:     Consultants:     Procedures:     Antimicrobials:       Subjective: Patient is feeling better, dyspnea continue to improve, he has been out of bed and ambulating in his room. No nausea or vomiting.   Objective: Vitals:   11/20/19 1600 11/20/19 2052 11/21/19 0355 11/21/19 0721  BP: 122/88 (!) 115/92 99/72 (!) 118/98  Pulse: 95 87 89   Resp: 18   18  Temp: 98 F (36.7 C) 98 F (36.7 C) 98 F (36.7 C) 97.8 F (36.6 C)  TempSrc: Oral Oral Oral Axillary  SpO2:  95% 95% 95%  Weight:      Height:        Intake/Output Summary (Last 24 hours) at 11/21/2019 0843 Last data filed at 11/21/2019 0630 Gross per 24 hour  Intake 1586.75 ml  Output 3250 ml  Net -1663.25 ml   Filed Weights   11/19/19 0444 11/19/19 0445  Weight: 83.2 kg 83.2 kg    Examination:   General: Not in pain or dyspnea, deconditioned  Neurology: Awake and alert, non focal  E ENT: mild pallor, no icterus, oral mucosa moist Cardiovascular: No JVD. S1-S2 present, rhythmic, no gallops, rubs, or murmurs. No lower extremity edema. Pulmonary: positive breath sounds bilaterally.  Gastrointestinal. Abdomen with no organomegaly, non tender, no rebound or guarding Skin. No rashes Musculoskeletal: no joint  deformities     Data Reviewed: I have personally reviewed following labs and imaging studies  CBC: Recent Labs  Lab 11/17/19 1151 11/18/19 0519 11/19/19 0425 11/20/19 0040  WBC 6.0 6.1 5.6 8.5  NEUTROABS 5.1  --  4.5 7.2  HGB 15.7 13.2 13.8 14.6  HCT 45.8 39.5 41.0 44.0  MCV 91.2 91.9 92.1 92.6  PLT 140* 121* 133* 182*   Basic Metabolic Panel: Recent Labs  Lab 11/17/19 1151 11/18/19 0519 11/19/19 0425 11/20/19 0040 11/21/19 0035  NA 135 133* 137 127* 138  K 3.5 3.4* 3.0* 4.1 5.1  CL 85* 90* 96* 90* 101  CO2 28 28 25 26 26   GLUCOSE 182* 123* 166* 240* 244*  BUN 36* 39* 45* 50* 41*  CREATININE 1.57* 1.53* 1.29* 1.36* 1.00  CALCIUM 8.4* 7.6* 7.8* 8.3* 8.8*  MG  --   --  2.2  --   --    GFR: Estimated Creatinine Clearance: 69.7 mL/min (by C-G formula based on SCr of 1 mg/dL). Liver Function Tests: Recent Labs  Lab 11/17/19 1151 11/18/19 0519 11/19/19 0425 11/20/19 0040 11/21/19 0035  AST 28 31 36 32 26  ALT 19 19 23 25 25   ALKPHOS 93 66 80 90 95  BILITOT 1.0 1.0 1.2 0.9 0.8  PROT 7.9 6.3* 6.8 6.8 6.1*  ALBUMIN 3.8 3.0*  3.4* 3.2* 2.9*   No results for input(s): LIPASE, AMYLASE in the last 168 hours. No results for input(s): AMMONIA in the last 168 hours. Coagulation Profile: Recent Labs  Lab 11/17/19 1223  INR 1.0   Cardiac Enzymes: No results for input(s): CKTOTAL, CKMB, CKMBINDEX, TROPONINI in the last 168 hours. BNP (last 3 results) No results for input(s): PROBNP in the last 8760 hours. HbA1C: No results for input(s): HGBA1C in the last 72 hours. CBG: Recent Labs  Lab 11/19/19 2209 11/20/19 0845 11/20/19 1200 11/20/19 1556 11/21/19 0721  GLUCAP 281* 195* 320* 363* 182*   Lipid Profile: No results for input(s): CHOL, HDL, LDLCALC, TRIG, CHOLHDL, LDLDIRECT in the last 72 hours. Thyroid Function Tests: No results for input(s): TSH, T4TOTAL, FREET4, T3FREE, THYROIDAB in the last 72 hours. Anemia Panel: Recent Labs    11/18/19 0939  11/21/19 0035  FERRITIN 541* 344*      Radiology Studies: I have reviewed all of the imaging during this hospital visit personally     Scheduled Meds: . aspirin EC  81 mg Oral Daily  . chlorpheniramine-HYDROcodone  5 mL Oral Q12H  . clopidogrel  75 mg Oral Daily  . dexamethasone (DECADRON) injection  6 mg Intravenous Q24H  . enoxaparin (LOVENOX) injection  40 mg Subcutaneous Q24H  . insulin aspart  0-6 Units Subcutaneous TID WC  . sodium chloride flush  3 mL Intravenous Q12H  . vitamin C  500 mg Oral Daily  . zinc sulfate  220 mg Oral Daily   Continuous Infusions: . sodium chloride    . remdesivir 100 mg in NS 250 mL 100 mg (11/20/19 1052)     LOS: 2 days        Mayrin Schmuck Annett Gulaaniel Raif Chachere, MD

## 2019-11-22 DIAGNOSIS — U071 COVID-19: Secondary | ICD-10-CM | POA: Diagnosis not present

## 2019-11-22 DIAGNOSIS — I25119 Atherosclerotic heart disease of native coronary artery with unspecified angina pectoris: Secondary | ICD-10-CM

## 2019-11-22 DIAGNOSIS — J449 Chronic obstructive pulmonary disease, unspecified: Secondary | ICD-10-CM | POA: Diagnosis not present

## 2019-11-22 DIAGNOSIS — I5022 Chronic systolic (congestive) heart failure: Secondary | ICD-10-CM | POA: Diagnosis not present

## 2019-11-22 LAB — BASIC METABOLIC PANEL
Anion gap: 11 (ref 5–15)
Anion gap: 15 (ref 5–15)
BUN: 42 mg/dL — ABNORMAL HIGH (ref 8–23)
BUN: 37 mg/dL — ABNORMAL HIGH (ref 8–23)
CO2: 26 mmol/L (ref 22–32)
CO2: 25 mmol/L (ref 22–32)
Calcium: 8.9 mg/dL (ref 8.9–10.3)
Calcium: 8.5 mg/dL — ABNORMAL LOW (ref 8.9–10.3)
Chloride: 105 mmol/L (ref 98–111)
Chloride: 99 mmol/L (ref 98–111)
Creatinine, Ser: 1.04 mg/dL (ref 0.61–1.24)
Creatinine, Ser: 1.1 mg/dL (ref 0.61–1.24)
GFR calc Af Amer: >60 mL/min (ref >60)
GFR calc Af Amer: >60 mL/min (ref >60)
GFR calc non Af Amer: >60 mL/min (ref >60)
GFR calc non Af Amer: >60 mL/min (ref >60)
Glucose, Bld: 231 mg/dL — ABNORMAL HIGH (ref 70–99)
Glucose, Bld: 305 mg/dL — ABNORMAL HIGH (ref 70–99)
Potassium: 5.3 mmol/L — ABNORMAL HIGH (ref 3.5–5.1)
Potassium: 3.9 mmol/L (ref 3.5–5.1)
Sodium: 142 mmol/L (ref 135–145)
Sodium: 139 mmol/L (ref 135–145)

## 2019-11-22 LAB — CULTURE, BLOOD (ROUTINE X 2)
Culture: NO GROWTH
Culture: NO GROWTH
Special Requests: ADEQUATE
Special Requests: ADEQUATE

## 2019-11-22 LAB — GLUCOSE, CAPILLARY
Glucose-Capillary: 317 mg/dL — ABNORMAL HIGH (ref 70–99)
Glucose-Capillary: 282 mg/dL — ABNORMAL HIGH (ref 70–99)
Glucose-Capillary: 172 mg/dL — ABNORMAL HIGH (ref 70–99)

## 2019-11-22 MED ORDER — TORSEMIDE 20 MG PO TABS
40.0000 mg | ORAL_TABLET | Freq: Two times a day (BID) | ORAL | Status: DC
  Administered 2019-11-22: 10:00:00 40 mg via ORAL
  Filled 2019-11-22 (×4): qty 2

## 2019-11-22 MED ORDER — GUAIFENESIN-DM 100-10 MG/5ML PO SYRP: 10.0000 mL | ORAL_SOLUTION | Freq: Four times a day (QID) | ORAL | 0 refills | Status: DC | PRN

## 2019-11-22 MED ORDER — SODIUM ZIRCONIUM CYCLOSILICATE 10 G PO PACK
10.0000 g | PACK | Freq: Once | ORAL | Status: AC
  Administered 2019-11-22: 10:00:00 10 g via ORAL
  Filled 2019-11-22 (×2): qty 1

## 2019-11-22 NOTE — Plan of Care (Signed)
Plan of care reviewed and discussed with Pt.

## 2019-11-22 NOTE — Discharge Summary (Signed)
Physician Discharge Summary  Gary Gutierrez VOH:[id number] DOB: 1951-07-12 DOA: 11/19/2019  PCP: [name], MD  Admit date: 11/19/2019 Discharge date: 11/22/2019  Admitted From: Home  Disposition:   Home   Recommendations for Outpatient Follow-up and new medication changes:  1. Follow up in 2 weeks with Dr. [name].  2. Continue self quarantine for 2 weeks, use a mask in public and maintain physical distancing. 3. Follow up renal function in 2 weeks.   Home Health: no   Equipment/Devices: no    Discharge Condition: stable  CODE STATUS: full  Diet recommendation: heart healthy and diabetic prudent.   Brief/Interim Summary: 68 year old male who presented with dyspnea. He does have significant past medical history for systolic heart failure, coronary artery disease, type 2 diabetes mellitus, hypertension, chronic kidney disease and dyslipidemia.  Patient presented to [facility] with dyspnea, cough, fevers and chills for about 48 hours.On his initial physical examination his blood pressure was 97/62, pulse rate 91, respiratory rate 25, temperature 99.4, oxygen saturation 93% on supplemental oxygen, he had increased work of breathing, decreased breath sounds bilaterally, positive rales but no wheezing, heart S1-S2 present, rhythmic, abdomen soft nontender, no lower extremity edema. NA 135, potassium 3.5, chloride 85, bicarb 28, glucose 182, BUN 36, creatinine 1.57, white count 6.0, hemoglobin 15.7, hematocrit 45.8, platelets 140.SARS COVID-19 was positive. Urine analysis was negative for infection. His chest radiograph had a reticular nodular infiltrate in the lower zone of the right upper lobe, left lower lobe interstitial infiltrate (personally reviewed).EKG 108 bpm, normal axis, normal intervals, sinus rhythm, J-point elevation V1 through V3, poor R wave progression, negative T waves in lead II, III and aVF.  Patient was admitted to the hospital  working diagnosis of right upper lobe community-acquired pneumonia.  Received antibiotic therapy with ceftriaxone and azithromycin, he was admitted under investigation for COVID-19 infection, his PCR for COVID-19 returned positive and he was transferred to [facility] for further treatment. Marland Kitchen He developed acute hypoxic respiratory failure with oxygen saturation down to 88%, requiring supplemental oxygen per nasal cannula up to 4 L/min.  Patient responded well to medical therapy, with decreased oxygen requirements, follow up CT chest with no PE, positive ground glass opacities bilaterally. Clinically ruled out bacterial pneumonia, and antibiotic therapy was discontinued.   1.  Acute hypoxic respiratory failure due to SARS COVID-19 viral pneumonia.  Patient was admitted to the telemetry ward, he received supplemental oxygen per nasal cannula, intravenous remdesivir and intravenous systemic corticosteroids with dexamethasone.  Antitussive agents, bronchodilators, zinc and vitamin C.  Airway clearance techniques with flutter valve and incentive spirometer.  Patient responded well to medical therapy with improvement of his symptoms and inflammatory markers.  Follow-up chest CT was negative for pulmonary embolism, groundglass opacities bilaterally, bacterial pneumonia was ruled out.  Antibiotics were discontinued.  2.  Systolic heart failure/ischemic cardiomyopathy, coronary artery disease.  Chronic and stable, left ventricle ejection fraction 25 to 30%.  No signs of exacerbation during hospitalization.  Torsemide was held due to acute kidney injury and dehydration, at discharge will resume torsemide. Continue carvedilol and isosorbide.   Continue aspirin and clopidogrel.  3.  Type 2 diabetes mellitus with steroids induced hyperglycemia.  Dyslipidemia.  Patient was placed on insulin sliding scale for glucose coverage and monitoring.  Systemic corticosteroids will be discontinued at discharge,  patient will resume glipizide and jardiance  Continue statin therapy.  4.  AKI on chronic kidney disease stage IIIa with hyponatremia hypokalemia/hyperkalemia.  Patient volume  depleted, received gentle hydration with isotonic saline, peak cr 1,57 at discharge creatinine down to 1.0.  His initial potassium down to 3.0, discharged 5.3, he will received sodium zirconium, and torsemide before discharge.  Recommend follow-up kidney function in 2 weeks. Discharge Na 142.   5.  COPD.  CT chest with evidence of emphysema, no clinical signs of acute exacerbation, continue bronchodilator therapy.  Deep venous thrombosis ruled out, VQ scan with right upper lobe anterior, possible PE versus changes related to pneumonia, ultrasonography lower extremities negative for DVT.  CT chest negative for pulmonary embolism.  Discharge Diagnoses:  Principal Problem:   Pneumonia due to COVID-19 virus Active Problems:   Coronary artery disease involving native coronary artery of native heart with angina pectoris (HCC)   Essential hypertension   CHF (congestive heart failure) (HCC)   Chronic obstructive pulmonary disease (HCC)   Acute respiratory failure due to COVID-19 [facility])    Discharge Instructions   Allergies as of 11/22/2019      Reactions   Entresto [sacubitril-valsartan] Other (See Comments)   Low blood pressure   Penicillins Other (See Comments)   Other reaction(s): Unknown, pt does not know reactions      Medication List    STOP taking these medications   ascorbic acid 500 MG tablet Commonly known as: VITAMIN C   dexamethasone 10 MG/ML injection Commonly known as: DECADRON   umeclidinium-vilanterol 62.5-25 MCG/INH Aepb Commonly known as: ANORO ELLIPTA   zinc sulfate 220 (50 Zn) MG capsule     TAKE these medications   Accu-Chek Aviva Plus test strip Generic drug: glucose blood TEST BLOOD SUGAR EVERY DAY AS DIRECTED What changed: See the new instructions.   albuterol 108 (90 Base)  MCG/ACT inhaler Commonly known as: VENTOLIN HFA Inhale 2 puffs into the lungs every 6 (six) hours as needed for wheezing or shortness of breath.   allopurinol 300 MG tablet Commonly known as: ZYLOPRIM TAKE 1 TABLET BY MOUTH EVERY DAY   aspirin EC 81 MG tablet Take 81 mg by mouth daily.   atorvastatin 80 MG tablet Commonly known as: LIPITOR TAKE 1 TABLET(80 MG) BY MOUTH AT BEDTIME What changed: See the new instructions.   benzonatate 100 MG capsule Commonly known as: TESSALON Take 1 capsule (100 mg total) by mouth 3 (three) times daily.   carvedilol 6.25 MG tablet Commonly known as: COREG Take 6.25 mg by mouth 2 (two) times daily with a meal.   cetirizine 10 MG tablet Commonly known as: ZYRTEC Take 10 mg by mouth daily.   clopidogrel 75 MG tablet Commonly known as: PLAVIX TAKE 1 TABLET(75 MG) BY MOUTH DAILY What changed: See the new instructions.   glipiZIDE 10 MG 24 hr tablet Commonly known as: GLUCOTROL XL TAKE 1 TABLET(10 MG) BY MOUTH DAILY What changed: See the new instructions.   guaiFENesin-dextromethorphan 100-10 MG/5ML syrup Commonly known as: ROBITUSSIN DM Take 10 mLs by mouth every 6 (six) hours as needed for cough.   Ipratropium-Albuterol 20-100 MCG/ACT Aers respimat Commonly known as: COMBIVENT Inhale 1 puff into the lungs every 6 (six) hours.   isosorbide mononitrate 30 MG 24 hr tablet Commonly known as: Imdur Take 1 tablet (30 mg total) by mouth daily.   Jardiance 25 MG Tabs tablet Generic drug: empagliflozin TAKE 1 TABLET BY MOUTH DAILY What changed: how much to take   Kombiglyze XR 2.04-999 MG Tb24 Generic drug: Saxagliptin-Metformin TAKE 2 TABLETS BY MOUTH DAILY   Lantus SoloStar 100 UNIT/ML Solostar Pen Generic drug: Insulin  Glargine Inject 50 Units into the skin daily. What changed: how much to take   nitroGLYCERIN 0.4 MG SL tablet Commonly known as: Nitrostat Place 1 tablet (0.4 mg total) under the tongue every 5 (five) minutes as  needed for chest pain.   pantoprazole 40 MG tablet Commonly known as: PROTONIX TAKE 1 TABLET(40 MG) BY MOUTH TWICE DAILY What changed: See the new instructions.   PEN NEEDLES 31GX5/16" 31G X 8 MM Misc 1 each by Does not apply route daily.   potassium chloride SA 20 MEQ tablet Commonly known as: KLOR-CON TAKE 2 TABLETS(40 MEQ) BY MOUTH DAILY What changed: See the new instructions.   torsemide 20 MG tablet Commonly known as: DEMADEX TAKE 2 TABLETS(40 MG) BY MOUTH TWICE DAILY What changed: See the new instructions.       Allergies  Allergen Reactions  . Entresto [Sacubitril-Valsartan] Other (See Comments)    Low blood pressure  . Penicillins Other (See Comments)    Other reaction(s): Unknown, pt does not know reactions    Consultations:     Procedures/Studies: Dg Chest 2 View  Result Date: 11/17/2019 CLINICAL DATA:  Suspected sepsis. Additional history provided: Shortness of breath, fever, cough. EXAM: CHEST - 2 VIEW COMPARISON:  Chest radiograph 06/05/2019 FINDINGS: Sequela of prior median sternotomy. The heart is at the upper limits of normal for size. Pulmonary vascular congestion. Superimposed upon chronic interstitial prominence, there is patchy opacity within the mid to lower right lung field suspicious for pneumonia. No evidence of pneumothorax. No sizable pleural effusion. No acute bony abnormality. IMPRESSION: Patchy airspace opacities within the mid to lower right lung field suspicious for pneumonia. Pulmonary vascular congestion. Redemonstrated chronic interstitial prominence. Electronically Signed   By: Jackey Loge DO   On: 11/17/2019 12:23   Ct Angio Chest Pe W Or Wo Contrast  Result Date: 11/20/2019 CLINICAL DATA:  68 year old male with suspected pulmonary embolism, intermediate probability with elevated D-dimer. Patient currently admitted with COVID pneumonia and acute hypoxic respiratory failure. EXAM: CT ANGIOGRAPHY CHEST WITH CONTRAST TECHNIQUE:  Multidetector CT imaging of the chest was performed using the standard protocol during bolus administration of intravenous contrast. Multiplanar CT image reconstructions and MIPs were obtained to evaluate the vascular anatomy. CONTRAST:  75mL OMNIPAQUE IOHEXOL 350 MG/ML SOLN COMPARISON:  None. FINDINGS: Cardiovascular: Adequate opacification of the pulmonary arteries to the segmental level. No evidence of central filling defect to suggest acute pulmonary embolus. The main pulmonary artery is normal in size. Mild cardiomegaly with left ventricular dilatation. No pericardial effusion. Extensive atherosclerotic calcifications throughout the native coronary arteries. Patient is status post median sternotomy with evidence of multivessel coronary artery bypass grafting. Conventional 3 vessel aortic arch. No evidence of aneurysm. Calcifications present along the thoracic aorta. Mediastinum/Nodes: Right paratracheal lymph node enlarged at 1.7 cm. Low right paratracheal lymph node enlarged at 1.8 cm. Calcifications present in the bilateral hilar nodal stations. The esophagus is unremarkable. Lungs/Pleura: Severe combined centrilobular and paraseptal pulmonary emphysema. Multifocal areas of ground-glass attenuation airspace opacity throughout all lobes of both lungs. Diffuse mild bronchial wall thickening. Trace right pleural effusion. Upper Abdomen: Visualized upper abdominal organs are unremarkable. Musculoskeletal: No chest wall abnormality. No acute or significant osseous findings. Review of the MIP images confirms the above findings. IMPRESSION: 1. Negative for pulmonary embolus. 2. Cardiomegaly with left ventricular dilatation. 3. Severe native coronary artery disease with surgical changes of prior multivessel CABG. 4. Multifocal bilateral ground-glass attenuation airspace opacities consistent with the clinical history of bilateral COVID pneumonia. 5. These  changes are superimposed on a background of severe  predominantly centrilobular pulmonary emphysema. 6. Nonspecific enlarged mediastinal lymph nodes are likely reactive or chronic. 7. Calcified bilateral hilar lymph nodes likely reflects sequelae of old granulomatous disease. Aortic Atherosclerosis (ICD10-I70.0) and Emphysema (ICD10-J43.9). Electronically Signed   By: Malachy MoanHeath  McCullough M.D.   On: 11/20/2019 10:26   Nm Pulmonary Perfusion  Result Date: 11/18/2019 CLINICAL DATA:  COVID positive patient. Positive D-dimer. Negative Doppler ultrasound EXAM: NUCLEAR MEDICINE PERFUSION LUNG SCAN TECHNIQUE: Perfusion images were obtained in multiple projections after intravenous injection of radiopharmaceutical. RADIOPHARMACEUTICALS:  4.8 mCi Tc-1740m MAA COMPARISON:  None. FINDINGS: Decreased peripheral perfusion in the RIGHT upper lobe anterolateral seen on 2 projections could represent pulmonary embolism. There is pneumonia in this region on radiograph. Decreased decreased perfusion lingular region is favored relate to pneumonia. IMPRESSION: Decreased perfusion in the RIGHT upper lobe anterior laterally with differential include pulmonary embolism versus pneumonia. These results will be called to the ordering clinician or representative by the Radiologist Assistant, and communication documented in the PACS or zVision Dashboard. Electronically Signed   By: Genevive BiStewart  Edmunds M.D.   On: 11/18/2019 16:37   [place] Venous Img Lower Bilateral (dvt)  Result Date: 11/17/2019 CLINICAL DATA:  Edema with positive D-dimer EXAM: BILATERAL LOWER EXTREMITY VENOUS DOPPLER ULTRASOUND TECHNIQUE: Gray-scale sonography with graded compression, as well as color Doppler and duplex ultrasound were performed to evaluate the lower extremity deep venous systems from the level of the common femoral vein and including the common femoral, femoral, profunda femoral, popliteal and calf veins including the posterior tibial, peroneal and gastrocnemius veins when visible. The superficial great saphenous  vein was also interrogated. Spectral Doppler was utilized to evaluate flow at rest and with distal augmentation maneuvers in the common femoral, femoral and popliteal veins. COMPARISON:  None. FINDINGS: RIGHT LOWER EXTREMITY Common Femoral Vein: No evidence of thrombus. Normal compressibility, respiratory phasicity and response to augmentation. Saphenofemoral Junction: No evidence of thrombus. Normal compressibility and flow on color Doppler imaging. Profunda Femoral Vein: No evidence of thrombus. Normal compressibility and flow on color Doppler imaging. Femoral Vein: No evidence of thrombus. Normal compressibility, respiratory phasicity and response to augmentation. Popliteal Vein: No evidence of thrombus. Normal compressibility, respiratory phasicity and response to augmentation. Calf Veins: No evidence of thrombus. Normal compressibility and flow on color Doppler imaging. LEFT LOWER EXTREMITY Common Femoral Vein: No evidence of thrombus. Normal compressibility, respiratory phasicity and response to augmentation. Saphenofemoral Junction: No evidence of thrombus. Normal compressibility and flow on color Doppler imaging. Profunda Femoral Vein: No evidence of thrombus. Normal compressibility and flow on color Doppler imaging. Femoral Vein: No evidence of thrombus. Normal compressibility, respiratory phasicity and response to augmentation. Popliteal Vein: No evidence of thrombus. Normal compressibility, respiratory phasicity and response to augmentation. Calf Veins: No evidence of thrombus. Normal compressibility and flow on color Doppler imaging. IMPRESSION: No evidence of deep venous thrombosis in either lower extremity. Electronically Signed   By: Jasmine PangKim  Fujinaga M.D.   On: 11/17/2019 21:02      Procedures:   Subjective: Patient is feeling better, dyspnea continue to improve, no nausea or vomiting, no chest pain.   Discharge Exam: Vitals:   11/22/19 0530 11/22/19 0534  BP: 93/64 94/61  Pulse: 78 72   Resp: 18   Temp: 97.9 F (36.6 C)   SpO2: 95% 95%   Vitals:   11/21/19 1638 11/21/19 1951 11/22/19 0530 11/22/19 0534  BP:  122/84 93/64 94/61   Pulse: 96 99 78 72  Resp:  18   Temp: 97.8 F (36.6 C) 97.7 F (36.5 C) 97.9 F (36.6 C)   TempSrc: Oral Oral Oral   SpO2: 95% 96% 95% 95%  Weight:      Height:        General: Not in pain or dyspnea.  Neurology: Awake and alert, non focal  E ENT: no pallor, no icterus, oral mucosa moist Cardiovascular: No JVD. S1-S2 present, rhythmic, no gallops, rubs, or murmurs. No lower extremity edema. Pulmonary: positive breath sounds bilaterally. Gastrointestinal. Abdomen with no organomegaly, non tender, no rebound or guarding Skin. No rashes Musculoskeletal: no joint deformities   The results of significant diagnostics from this hospitalization (including imaging, microbiology, ancillary and laboratory) are listed below for reference.     Microbiology: Recent Results (from the past 240 hour(s))  Culture, blood (Routine x 2)     Status: None (Preliminary result)   Collection Time: 11/17/19 11:51 AM   Specimen: BLOOD  Result Value Ref Range Status   Specimen Description BLOOD LEFT ANTECUBITAL  Final   Special Requests   Final    BOTTLES DRAWN AEROBIC AND ANAEROBIC Blood Culture adequate volume   Culture   Final    NO GROWTH 4 DAYS Performed at [facility], [address] [place], [place] [address]    Report Status PENDING  Incomplete  Culture, blood (Routine x 2)     Status: None (Preliminary result)   Collection Time: 11/17/19  1:55 PM   Specimen: BLOOD  Result Value Ref Range Status   Specimen Description BLOOD LEFT ANTECUBITAL  Final   Special Requests   Final    BOTTLES DRAWN AEROBIC AND ANAEROBIC Blood Culture adequate volume   Culture   Final    NO GROWTH 4 DAYS Performed at [facility], [address], [place], [place] [address]    Report Status PENDING  Incomplete  SARS CORONAVIRUS 2 (TAT 6-24  HRS) Nasopharyngeal Nasopharyngeal Swab     Status: Abnormal   Collection Time: 11/17/19  3:39 PM   Specimen: Nasopharyngeal Swab  Result Value Ref Range Status   SARS Coronavirus 2 POSITIVE (A) NEGATIVE Final    Comment: RESULT CALLED TO, READ BACK BY AND VERIFIED WITH: V ASHLEY,RN 0043 11/18/2019 D BRADLEY (NOTE) SARS-CoV-2 target nucleic acids are DETECTED. The SARS-CoV-2 RNA is generally detectable in upper and lower respiratory specimens during the acute phase of infection. Positive results are indicative of active infection with SARS-CoV-2. Clinical  correlation with patient history and other diagnostic information is necessary to determine patient infection status. Positive results do  not rule out bacterial infection or co-infection with other viruses. The expected result is Negative. Fact Sheet for Patients: HairSlick.no Fact Sheet for Healthcare Providers: quierodirigir.com This test is not yet approved or cleared by the [place] FDA and  has been authorized for detection and/or diagnosis of SARS-CoV-2 by FDA under an Emergency Use Authorization (EUA). This EUA will remain  in effect (meaning this test can be used) for  the duration of the COVID-19 declaration under Section 564(b)(1) of the Act, 21 U.S.C. section 360bbb-3(b)(1), unless the authorization is terminated or revoked sooner. Performed at [facility], [address] [place], [place] [address]   Sputum culture     Status: None   Collection Time: 11/18/19  9:46 AM   Specimen: Expectorated Sputum  Result Value Ref Range Status   Specimen Description EXPECTORATED SPUTUM  Final   Special Requests NONE  Final   Sputum evaluation  Final    THIS SPECIMEN IS ACCEPTABLE FOR SPUTUM CULTURE Performed at [facility], [address] [place], [place] [address]    Report Status 11/18/2019 FINAL  Final  Culture, respiratory     Status: None    Collection Time: 11/18/19  9:46 AM  Result Value Ref Range Status   Specimen Description   Final    EXPECTORATED SPUTUM Performed at [facility], [address] [place], [place] [address]    Special Requests   Final    NONE Reflexed from [id number] Performed at [facility], [address] [place], [place] [address]    Gram Stain   Final    RARE WBC PRESENT,BOTH PMN AND MONONUCLEAR NO ORGANISMS SEEN    Culture   Final    RARE Consistent with normal respiratory flora. Performed at [facility], [place] [address] [place], [place] [address]    Report Status 11/20/2019 FINAL  Final     Labs: BNP (last 3 results) Recent Labs    10/18/19 1002 10/22/19 0852 11/18/19 0939  BNP 272.0* 329.0* 301.6*   Basic Metabolic Panel: Recent Labs  Lab 11/18/19 0519 11/19/19 0425 11/20/19 0040 11/21/19 0035 11/22/19 0150  NA 133* 137 127* 138 142  K 3.4* 3.0* 4.1 5.1 5.3*  CL 90* 96* 90* 101 105  CO2 28 25 26 26 26   GLUCOSE 123* 166* 240* 244* 231*  BUN 39* 45* 50* 41* 42*  CREATININE 1.53* 1.29* 1.36* 1.00 1.04  CALCIUM 7.6* 7.8* 8.3* 8.8* 8.9  MG  --  2.2  --   --   --    Liver Function Tests: Recent Labs  Lab 11/17/19 1151 11/18/19 0519 11/19/19 0425 11/20/19 0040 11/21/19 0035  AST 28 31 36 32 26  ALT 19 19 23 25 25   ALKPHOS 93 66 80 90 95  BILITOT 1.0 1.0 1.2 0.9 0.8  PROT 7.9 6.3* 6.8 6.8 6.1*  ALBUMIN 3.8 3.0* 3.4* 3.2* 2.9*   No results for input(s): LIPASE, AMYLASE in the last 168 hours. No results for input(s): AMMONIA in the last 168 hours. CBC: Recent Labs  Lab 11/17/19 1151 11/18/19 0519 11/19/19 0425 11/20/19 0040  WBC 6.0 6.1 5.6 8.5  NEUTROABS 5.1  --  4.5 7.2  HGB 15.7 13.2 13.8 14.6  HCT 45.8 39.5 41.0 44.0  MCV 91.2 91.9 92.1 92.6  PLT 140* 121* 133* 146*   Cardiac Enzymes: No results for input(s): CKTOTAL, CKMB, CKMBINDEX, TROPONINI in the last 168 hours. BNP: Invalid input(s): POCBNP CBG: Recent Labs  Lab  11/20/19 1556 11/21/19 0721 11/21/19 1101 11/21/19 1547 11/21/19 2103  GLUCAP 363* 182* 241* 333* 369*   D-Dimer Recent Labs    11/20/19 0040 11/21/19 0035  DDIMER 1.88* 2.30*   Hgb A1c No results for input(s): HGBA1C in the last 72 hours. Lipid Profile No results for input(s): CHOL, HDL, LDLCALC, TRIG, CHOLHDL, LDLDIRECT in the last 72 hours. Thyroid function studies No results for input(s): TSH, T4TOTAL, T3FREE, THYROIDAB in the last 72 hours.  Invalid input(s): FREET3 Anemia work up Recent Labs    11/21/19 0035  FERRITIN 344*   Urinalysis    Component Value Date/Time   COLORURINE STRAW (A) 11/17/2019 1151   APPEARANCEUR CLEAR (A) 11/17/2019 1151   APPEARANCEUR CLEAR 07/09/2012 1250   LABSPEC 1.007 11/17/2019 1151   LABSPEC 1.025 07/09/2012 1250   PHURINE 6.0 11/17/2019 1151   GLUCOSEU >=500 (A) 11/17/2019 1151   GLUCOSEU NEGATIVE 07/09/2012 1250  HGBUR SMALL (A) 11/17/2019 1151   BILIRUBINUR NEGATIVE 11/17/2019 1151   BILIRUBINUR neg 06/10/2017 0900   BILIRUBINUR NEGATIVE 07/09/2012 1250   KETONESUR NEGATIVE 11/17/2019 1151   PROTEINUR NEGATIVE 11/17/2019 1151   UROBILINOGEN 0.2 06/10/2017 0900   NITRITE NEGATIVE 11/17/2019 1151   LEUKOCYTESUR NEGATIVE 11/17/2019 1151   LEUKOCYTESUR NEGATIVE 07/09/2012 1250   Sepsis Labs Invalid input(s): PROCALCITONIN,  WBC,  LACTICIDVEN Microbiology Recent Results (from the past 240 hour(s))  Culture, blood (Routine x 2)     Status: None (Preliminary result)   Collection Time: 11/17/19 11:51 AM   Specimen: BLOOD  Result Value Ref Range Status   Specimen Description BLOOD LEFT ANTECUBITAL  Final   Special Requests   Final    BOTTLES DRAWN AEROBIC AND ANAEROBIC Blood Culture adequate volume   Culture   Final    NO GROWTH 4 DAYS Performed at [facility], [address], [place], [place] [address]    Report Status PENDING  Incomplete  Culture, blood (Routine x 2)     Status: None (Preliminary result)    Collection Time: 11/17/19  1:55 PM   Specimen: BLOOD  Result Value Ref Range Status   Specimen Description BLOOD LEFT ANTECUBITAL  Final   Special Requests   Final    BOTTLES DRAWN AEROBIC AND ANAEROBIC Blood Culture adequate volume   Culture   Final    NO GROWTH 4 DAYS Performed at [facility], [address] [place], [place] [address]    Report Status PENDING  Incomplete  SARS CORONAVIRUS 2 (TAT 6-24 HRS) Nasopharyngeal Nasopharyngeal Swab     Status: Abnormal   Collection Time: 11/17/19  3:39 PM   Specimen: Nasopharyngeal Swab  Result Value Ref Range Status   SARS Coronavirus 2 POSITIVE (A) NEGATIVE Final    Comment: RESULT CALLED TO, READ BACK BY AND VERIFIED WITH: V ASHLEY,RN 0043 11/18/2019 D BRADLEY (NOTE) SARS-CoV-2 target nucleic acids are DETECTED. The SARS-CoV-2 RNA is generally detectable in upper and lower respiratory specimens during the acute phase of infection. Positive results are indicative of active infection with SARS-CoV-2. Clinical  correlation with patient history and other diagnostic information is necessary to determine patient infection status. Positive results do  not rule out bacterial infection or co-infection with other viruses. The expected result is Negative. Fact Sheet for Patients: HairSlick.no Fact Sheet for Healthcare Providers: quierodirigir.com This test is not yet approved or cleared by the [place] FDA and  has been authorized for detection and/or diagnosis of SARS-CoV-2 by FDA under an Emergency Use Authorization (EUA). This EUA will remain  in effect (meaning this test can be used) for  the duration of the COVID-19 declaration under Section 564(b)(1) of the Act, 21 U.S.C. section 360bbb-3(b)(1), unless the authorization is terminated or revoked sooner. Performed at [facility], [address], [place], [place] [address]   Sputum culture     Status: None    Collection Time: 11/18/19  9:46 AM   Specimen: Expectorated Sputum  Result Value Ref Range Status   Specimen Description EXPECTORATED SPUTUM  Final   Special Requests NONE  Final   Sputum evaluation   Final    THIS SPECIMEN IS ACCEPTABLE FOR SPUTUM CULTURE Performed at [facility], [address], [place], [place] [address]    Report Status 11/18/2019 FINAL  Final  Culture, respiratory     Status: None   Collection Time: 11/18/19  9:46 AM  Result Value Ref Range Status  Specimen Description   Final    EXPECTORATED SPUTUM Performed at [facility], [address] [place], [place] [address]    Special Requests   Final    NONE Reflexed from [id number] Performed at [facility], [address] [place], [place] [address]    Gram Stain   Final    RARE WBC PRESENT,BOTH PMN AND MONONUCLEAR NO ORGANISMS SEEN    Culture   Final    RARE Consistent with normal respiratory flora. Performed at [facility], [address], [place], [place] [address]    Report Status 11/20/2019 FINAL  Final     Time coordinating discharge: 45 minutes  SIGNED:   Coralie Keens, MD  [facility] 11/22/2019, 8:10 AM

## 2019-11-22 NOTE — Progress Notes (Signed)
Results for LESHON, ARMISTEAD (MRN [id number]) as of 11/22/2019 07:25  Ref. Range 11/22/2019 01:50  Potassium Latest Ref Range: 3.5 - 5.1 mmol/L 5.3 (H)   Notified attending

## 2019-11-23 LAB — GLUCOSE, CAPILLARY: Glucose-Capillary: 298 mg/dL — ABNORMAL HIGH (ref 70–99)

## 2019-12-05 ENCOUNTER — Other Ambulatory Visit: Payer: Self-pay | Admitting: Internal Medicine

## 2019-12-08 ENCOUNTER — Telehealth: Payer: Self-pay

## 2019-12-08 ENCOUNTER — Other Ambulatory Visit: Payer: Self-pay | Admitting: Internal Medicine

## 2019-12-08 NOTE — Telephone Encounter (Signed)
Pt called stating he has been in the "covid hospital [facility] He has been out x 2 weeks but feels his "chest is not doing good and needs a call back."

## 2019-12-08 NOTE — Telephone Encounter (Signed)
Called pt back but phone went straight to voicemail. Left message asking pt to callback with more detailed sxs. Explained that [facility] currently does not allow [place] to see pts with any Covid symptoms in the clinics right now. If he is still experiencing Covid symptoms explained he will need to be seen at a UC so he can be seen in person. Otherwise, asked him to call back with more detailed information as to what symptoms he is having.

## 2019-12-28 ENCOUNTER — Other Ambulatory Visit: Payer: Self-pay | Admitting: Family

## 2019-12-28 ENCOUNTER — Other Ambulatory Visit: Payer: Self-pay | Admitting: Internal Medicine

## 2020-01-03 ENCOUNTER — Other Ambulatory Visit: Payer: Self-pay | Admitting: Internal Medicine

## 2020-01-10 DIAGNOSIS — R05 Cough: Secondary | ICD-10-CM | POA: Diagnosis not present

## 2020-01-10 DIAGNOSIS — J432 Centrilobular emphysema: Secondary | ICD-10-CM | POA: Diagnosis not present

## 2020-01-10 DIAGNOSIS — U071 COVID-19: Secondary | ICD-10-CM | POA: Diagnosis not present

## 2020-01-10 DIAGNOSIS — J1282 Pneumonia due to coronavirus disease 2019: Secondary | ICD-10-CM | POA: Diagnosis not present

## 2020-01-10 DIAGNOSIS — R0600 Dyspnea, unspecified: Secondary | ICD-10-CM | POA: Diagnosis not present

## 2020-01-14 ENCOUNTER — Ambulatory Visit: Payer: Medicare Other | Admitting: Internal Medicine

## 2020-01-16 ENCOUNTER — Other Ambulatory Visit: Payer: Self-pay | Admitting: Internal Medicine

## 2020-01-18 ENCOUNTER — Other Ambulatory Visit: Payer: Self-pay

## 2020-01-18 ENCOUNTER — Encounter: Payer: Self-pay | Admitting: Internal Medicine

## 2020-01-18 ENCOUNTER — Ambulatory Visit (INDEPENDENT_AMBULATORY_CARE_PROVIDER_SITE_OTHER): Payer: Medicare Other | Admitting: Internal Medicine

## 2020-01-18 VITALS — BP 118/72 | HR 90 | Temp 98.4°F | Ht 69.0 in | Wt 178.0 lb

## 2020-01-18 DIAGNOSIS — L03032 Cellulitis of left toe: Secondary | ICD-10-CM

## 2020-01-18 DIAGNOSIS — I25119 Atherosclerotic heart disease of native coronary artery with unspecified angina pectoris: Secondary | ICD-10-CM | POA: Diagnosis not present

## 2020-01-18 DIAGNOSIS — I739 Peripheral vascular disease, unspecified: Secondary | ICD-10-CM | POA: Diagnosis not present

## 2020-01-18 MED ORDER — SULFAMETHOXAZOLE-TRIMETHOPRIM 800-160 MG PO TABS: 1.0000 | ORAL_TABLET | Freq: Two times a day (BID) | ORAL | 0 refills | Status: AC

## 2020-01-18 NOTE — Progress Notes (Signed)
Date:  01/18/2020   Name:  Gary Gutierrez   DOB:  [birth year]   MRN:  [id number]   Chief Complaint: Toe Pain (Great toe on LT foot. Swollen and red. No bleeding or pus. Googled what to do with his foot- he soaked it 3-4 times a day in epson salt, and tea tree oil, and abx cream. )  Toe Pain  There was no injury mechanism. The pain is present in the left toes. The pain is mild. The pain has been constant since onset. Associated symptoms comments: Red slightly swollen around great toe nail. Painful.. Treatments tried: topical antibiotics.  Claudication - he is having more pain in his left leg.  He can not walk as far as previously.  However he recently had covid infection and was hospitalized.  He is still short of breath.  He relates an involved story about having his circulation tested years ago and being told there was decreased flow.  He denies any intervention. CAD - he continues on his same medications.  No chest pain or lower extremity edema. Lab Results  Component Value Date   CREATININE 1.10 11/22/2019   BUN 37 (H) 11/22/2019   NA 139 11/22/2019   K 3.9 11/22/2019   CL 99 11/22/2019   CO2 25 11/22/2019   Lab Results  Component Value Date   CHOL 150 10/18/2019   HDL 35 (L) 10/18/2019   LDLCALC 84 10/18/2019   TRIG 179 (H) 10/18/2019   CHOLHDL 4.3 10/18/2019   Lab Results  Component Value Date   TSH 2.85 10/11/2014   Lab Results  Component Value Date   HGBA1C 7.0 (H) 11/18/2019     Review of Systems  Constitutional: Positive for unexpected weight change. Negative for chills, diaphoresis, fatigue and fever.  HENT: Negative for trouble swallowing.   Respiratory: Positive for shortness of breath. Negative for cough, chest tightness and wheezing.   Cardiovascular: Negative for chest pain, palpitations and leg swelling.  Gastrointestinal: Negative for abdominal pain, constipation and diarrhea.  Musculoskeletal: Positive for gait problem and myalgias.  Neurological:  Negative for dizziness, light-headedness and headaches.    Patient Active Problem List   Diagnosis Date Noted  . Acute on chronic combined systolic and diastolic CHF (congestive heart failure) (HCC) 11/17/2019  . Type II diabetes mellitus with complication (HCC) 10/27/2018  . Chronic obstructive pulmonary disease (HCC) 02/11/2018  . CHF (congestive heart failure) (HCC) 10/29/2017  . Polyneuropathy due to type 2 diabetes mellitus (HCC) 07/01/2017  . PAD (peripheral artery disease) (HCC) 06/30/2017  . Tobacco use disorder, mild, in sustained remission, abuse 04/10/2017  . Hx of colonic polyps 04/10/2016  . Hearing loss of both ears 01/03/2016  . Hyperlipidemia associated with type 2 diabetes mellitus (HCC) 06/09/2015  . Essential hypertension 06/09/2015  . Multilevel degenerative disc disease 06/09/2015  . Coronary artery disease involving native coronary artery of native heart with angina pectoris (HCC) 05/09/2014    Allergies  Allergen Reactions  . Entresto [Sacubitril-Valsartan] Other (See Comments)    Low blood pressure  . Penicillins Other (See Comments)    Other reaction(s): Unknown, pt does not know reactions    Past Surgical History:  Procedure Laterality Date  . APPENDECTOMY    . colonscopy  2010   benign polyps  . CORONARY ARTERY BYPASS GRAFT  1995  . CORONARY STENT INTERVENTION N/A 02/03/2017   Procedure: Coronary Stent Intervention;  Surgeon: Alwyn Pea, MD;  Location: [facility] INVASIVE CV LAB;  Service: Cardiovascular;  Laterality: N/A;  . ESOPHAGOGASTRODUODENOSCOPY  2010   normal  . LEFT HEART CATH AND CORONARY ANGIOGRAPHY N/A 02/03/2017   Procedure: Left Heart Cath and Coronary Angiography;  Surgeon: Alwyn Pea, MD;  Location: [facility] INVASIVE CV LAB;  Service: Cardiovascular;  Laterality: N/A;  . PERCUTANEOUS CORONARY STENT INTERVENTION (PCI-S)  2010, 2015   4 stents total    Social History   Tobacco Use  . Smoking status: Former Smoker    Packs/day:  1.50    Years: 29.00    Pack years: 43.50    Types: Cigarettes    Quit date: 12/30/1994    Years since quitting: 25.0  . Smokeless tobacco: Never Used  . Tobacco comment: smoking cessation materials not required  Substance Use Topics  . Alcohol use: No    Alcohol/week: 0.0 standard drinks  . Drug use: No     Medication list has been reviewed and updated.  Current Meds  Medication Sig  . ACCU-CHEK AVIVA PLUS test strip TEST BLOOD SUGAR EVERY DAY AS DIRECTED (Patient taking differently: 1 each by Other route as directed. )  . albuterol (VENTOLIN HFA) 108 (90 Base) MCG/ACT inhaler Inhale 2 puffs into the lungs every 6 (six) hours as needed for wheezing or shortness of breath.  . allopurinol (ZYLOPRIM) 300 MG tablet Take 1 tablet (300 mg total) by mouth daily.  Marland Kitchen aspirin EC 81 MG tablet Take 81 mg by mouth daily.   Marland Kitchen atorvastatin (LIPITOR) 80 MG tablet TAKE 1 TABLET(80 MG) BY MOUTH AT BEDTIME (Patient taking differently: Take 80 mg by mouth at bedtime. )  . carvedilol (COREG) 6.25 MG tablet Take 6.25 mg by mouth 2 (two) times daily with a meal.  . cetirizine (ZYRTEC) 10 MG tablet Take 10 mg by mouth daily.  . clopidogrel (PLAVIX) 75 MG tablet TAKE 1 TABLET(75 MG) BY MOUTH DAILY (Patient taking differently: Take 75 mg by mouth daily. )  . fluticasone (FLONASE) 50 MCG/ACT nasal spray   . glipiZIDE (GLUCOTROL XL) 10 MG 24 hr tablet TAKE 1 TABLET(10 MG) BY MOUTH DAILY (Patient taking differently: Take 10 mg by mouth daily. )  . Insulin Pen Needle (PEN NEEDLES 31GX5/16") 31G X 8 MM MISC 1 each by Does not apply route daily.  . isosorbide mononitrate (IMDUR) 30 MG 24 hr tablet Take 1 tablet (30 mg total) by mouth daily.  Marland Kitchen JARDIANCE 25 MG TABS tablet TAKE 1 TABLET BY MOUTH DAILY  . KOMBIGLYZE XR 2.04-999 MG TB24 TAKE 2 TABLETS BY MOUTH DAILY (Patient taking differently: Take 2 tablets by mouth daily. )  . LANTUS SOLOSTAR 100 UNIT/ML Solostar Pen ADMINISTER 50 UNITS UNDER THE SKIN DAILY (Patient  taking differently: 35 Units. )  . nitroGLYCERIN (NITROSTAT) 0.4 MG SL tablet Place 1 tablet (0.4 mg total) under the tongue every 5 (five) minutes as needed for chest pain.  . pantoprazole (PROTONIX) 40 MG tablet TAKE 1 TABLET(40 MG) BY MOUTH TWICE DAILY  . potassium chloride SA (K-DUR,KLOR-CON) 20 MEQ tablet TAKE 2 TABLETS(40 MEQ) BY MOUTH DAILY (Patient taking differently: Take 40 mEq by mouth daily. )  . torsemide (DEMADEX) 20 MG tablet TAKE 2 TABLETS(40 MG) BY MOUTH TWICE DAILY (Patient taking differently: Take 80 mg by mouth 2 (two) times daily. )    PHQ 2/9 Scores 01/18/2020 04/22/2019 07/22/2018 07/14/2018  PHQ - 2 Score 0 0 0 0  PHQ- 9 Score - - 0 -    BP Readings from Last 3 Encounters:  01/18/20 118/72  11/22/19  116/83  11/19/19 129/70    Physical Exam Vitals and nursing note reviewed.  Constitutional:      General: He is not in acute distress.    Appearance: Normal appearance. He is well-developed.  HENT:     Head: Normocephalic and atraumatic.  Cardiovascular:     Rate and Rhythm: Normal rate and regular rhythm.     Pulses:          Dorsalis pedis pulses are 1+ on the right side and 0 on the left side.       Posterior tibial pulses are 0 on the right side and 0 on the left side.     Heart sounds: No murmur.  Pulmonary:     Effort: Pulmonary effort is normal. No respiratory distress.     Breath sounds: No wheezing or rhonchi.  Musculoskeletal:     Cervical back: Normal range of motion.     Right lower leg: No edema.     Left lower leg: No edema.       Feet:  Lymphadenopathy:     Cervical: No cervical adenopathy.  Skin:    General: Skin is warm and dry.     Findings: No rash.  Neurological:     Mental Status: He is alert and oriented to person, place, and time.  Psychiatric:        Attention and Perception: Attention normal.        Behavior: Behavior normal.        Thought Content: Thought content normal.     Wt Readings from Last 3 Encounters:  01/18/20  178 lb (80.7 kg)  11/19/19 183 lb 6.8 oz (83.2 kg)  11/17/19 183 lb (83 kg)    BP 118/72   Pulse 90   Temp 98.4 F (36.9 C) (Oral)   Ht 5\' 9"  (1.753 m)   Wt 178 lb (80.7 kg)   SpO2 96%   BMI 26.29 kg/m   Assessment and Plan: 1. Cellulitis of toe of left foot Needs antibiotics orally Continue local care, monitor for worsening - sulfamethoxazole-trimethoprim (BACTRIM DS) 800-160 MG tablet; Take 1 tablet by mouth 2 (two) times daily for 10 days.  Dispense: 20 tablet; Refill: 0  2. PAD (peripheral artery disease) ([place]) Unable to palpate pulses on the left - Ambulatory referral to Vascular Surgery  3. Coronary artery disease involving native coronary artery of native heart with angina pectoris (HCC) Clinically stable Slight decrease in stamina and increase in SOB since hospitalization for Covid.  Hopefully he will gradually improve.    Partially dictated using Editor, commissioning. Any errors are unintentional.  Halina Maidens, MD [facility]  01/18/2020

## 2020-01-24 ENCOUNTER — Other Ambulatory Visit (INDEPENDENT_AMBULATORY_CARE_PROVIDER_SITE_OTHER): Payer: Self-pay | Admitting: Vascular Surgery

## 2020-01-24 ENCOUNTER — Ambulatory Visit (INDEPENDENT_AMBULATORY_CARE_PROVIDER_SITE_OTHER): Payer: Medicare Other

## 2020-01-24 ENCOUNTER — Other Ambulatory Visit: Payer: Self-pay

## 2020-01-24 ENCOUNTER — Ambulatory Visit (INDEPENDENT_AMBULATORY_CARE_PROVIDER_SITE_OTHER): Payer: Medicare Other | Admitting: Vascular Surgery

## 2020-01-24 ENCOUNTER — Encounter (INDEPENDENT_AMBULATORY_CARE_PROVIDER_SITE_OTHER): Payer: Self-pay | Admitting: Vascular Surgery

## 2020-01-24 VITALS — BP 102/66 | HR 92 | Resp 16 | Wt 179.2 lb

## 2020-01-24 DIAGNOSIS — I10 Essential (primary) hypertension: Secondary | ICD-10-CM

## 2020-01-24 DIAGNOSIS — I25119 Atherosclerotic heart disease of native coronary artery with unspecified angina pectoris: Secondary | ICD-10-CM

## 2020-01-24 DIAGNOSIS — E118 Type 2 diabetes mellitus with unspecified complications: Secondary | ICD-10-CM

## 2020-01-24 DIAGNOSIS — I739 Peripheral vascular disease, unspecified: Secondary | ICD-10-CM

## 2020-01-24 DIAGNOSIS — J449 Chronic obstructive pulmonary disease, unspecified: Secondary | ICD-10-CM

## 2020-01-24 DIAGNOSIS — Z8679 Personal history of other diseases of the circulatory system: Secondary | ICD-10-CM | POA: Diagnosis not present

## 2020-01-24 NOTE — Progress Notes (Signed)
MRN : [id number]  Gary Gutierrez is a 69 y.o. ([birth year]) male who presents with chief complaint of  Chief Complaint  Patient presents with  . Follow-up    ref [name] PAD  .  History of Present Illness:  The patient is seen for evaluation of painful lower extremities. He states the tingling and burning began about 8 years ago and has progressed with time. He notes that the left foot is more significantly affected than the right.  Patient notes the pain is variable and not always associated with activity.  The pain is somewhat consistent day to day occurring on most days. The patient notes the pain also occurs with standing and routinely seems worse as the day wears on.  Although the pain has been progressive over the past several years he notes that over this past year since he was last in the office everything is about the same. The patient denies that these symptoms are causing  a negative impact on quality of life and daily activities.  He states he was told years ago at the time of his CABG that he had an 80% stenosis of the aorta. Subsequently while living in [place] he underwent extensive celation therapy. He states the last time his aorta was checked and was only a 50% stenosis  The patient denies rest pain or dangling of an extremity off the side of the bed during the night for relief. No open wounds or sores at this time. No history of DVT or phlebitis. No prior peripheral interventions or surgeries.  There is a  history of back problems and DJD of the lumbar and sacral spine.   He has a history of coronary artery disease and his status post both CABG as well as several stents in the coronary system. No amaurosis fugax or TIAs, no history of CVA.  Current Meds  Medication Sig  . ACCU-CHEK AVIVA PLUS test strip TEST BLOOD SUGAR EVERY DAY AS DIRECTED (Patient taking differently: 1 each by Other route as directed. )  . albuterol (VENTOLIN HFA) 108 (90 Base) MCG/ACT  inhaler Inhale 2 puffs into the lungs every 6 (six) hours as needed for wheezing or shortness of breath.  . allopurinol (ZYLOPRIM) 300 MG tablet Take 1 tablet (300 mg total) by mouth daily.  Marland Kitchen aspirin EC 81 MG tablet Take 81 mg by mouth daily.   Marland Kitchen atorvastatin (LIPITOR) 80 MG tablet TAKE 1 TABLET(80 MG) BY MOUTH AT BEDTIME (Patient taking differently: Take 80 mg by mouth at bedtime. )  . carvedilol (COREG) 6.25 MG tablet Take 6.25 mg by mouth 2 (two) times daily with a meal.  . cetirizine (ZYRTEC) 10 MG tablet Take 10 mg by mouth daily.  . clopidogrel (PLAVIX) 75 MG tablet TAKE 1 TABLET(75 MG) BY MOUTH DAILY (Patient taking differently: Take 75 mg by mouth daily. )  . fluticasone (FLONASE) 50 MCG/ACT nasal spray   . glipiZIDE (GLUCOTROL XL) 10 MG 24 hr tablet TAKE 1 TABLET(10 MG) BY MOUTH DAILY (Patient taking differently: Take 10 mg by mouth daily. )  . Insulin Pen Needle (PEN NEEDLES 31GX5/16") 31G X 8 MM MISC 1 each by Does not apply route daily.  . Ipratropium-Albuterol (COMBIVENT) 20-100 MCG/ACT AERS respimat Inhale 1 puff into the lungs every 6 (six) hours.  Marland Kitchen JARDIANCE 25 MG TABS tablet TAKE 1 TABLET BY MOUTH DAILY  . KOMBIGLYZE XR 2.04-999 MG TB24 TAKE 2 TABLETS BY MOUTH DAILY (Patient taking differently: Take 2 tablets by mouth  daily. )  . LANTUS SOLOSTAR 100 UNIT/ML Solostar Pen ADMINISTER 50 UNITS UNDER THE SKIN DAILY (Patient taking differently: 35 Units. )  . nitroGLYCERIN (NITROSTAT) 0.4 MG SL tablet Place 1 tablet (0.4 mg total) under the tongue every 5 (five) minutes as needed for chest pain.  . pantoprazole (PROTONIX) 40 MG tablet TAKE 1 TABLET(40 MG) BY MOUTH TWICE DAILY  . potassium chloride SA (K-DUR,KLOR-CON) 20 MEQ tablet TAKE 2 TABLETS(40 MEQ) BY MOUTH DAILY (Patient taking differently: Take 40 mEq by mouth daily. )  . sulfamethoxazole-trimethoprim (BACTRIM DS) 800-160 MG tablet Take 1 tablet by mouth 2 (two) times daily for 10 days.  Marland Kitchen torsemide (DEMADEX) 20 MG tablet TAKE 2  TABLETS(40 MG) BY MOUTH TWICE DAILY (Patient taking differently: Take 80 mg by mouth 2 (two) times daily. )    Past Medical History:  Diagnosis Date  . Acute respiratory failure due to COVID-19 (HCC) 11/19/2019  . Allergy   . CHF (congestive heart failure) (HCC)   . Coronary artery disease   . Diabetes mellitus without complication (HCC)   . Hyperlipidemia   . Hypertension   . Pneumonia due to COVID-19 virus 11/19/2019    Past Surgical History:  Procedure Laterality Date  . APPENDECTOMY    . colonscopy  2010   benign polyps  . CORONARY ARTERY BYPASS GRAFT  1995  . CORONARY STENT INTERVENTION N/A 02/03/2017   Procedure: Coronary Stent Intervention;  Surgeon: Alwyn Pea, MD;  Location: [facility] INVASIVE CV LAB;  Service: Cardiovascular;  Laterality: N/A;  . ESOPHAGOGASTRODUODENOSCOPY  2010   normal  . LEFT HEART CATH AND CORONARY ANGIOGRAPHY N/A 02/03/2017   Procedure: Left Heart Cath and Coronary Angiography;  Surgeon: Alwyn Pea, MD;  Location: [facility] INVASIVE CV LAB;  Service: Cardiovascular;  Laterality: N/A;  . PERCUTANEOUS CORONARY STENT INTERVENTION (PCI-S)  2010, 2015   4 stents total    Social History Social History   Tobacco Use  . Smoking status: Former Smoker    Packs/day: 1.50    Years: 29.00    Pack years: 43.50    Types: Cigarettes    Quit date: 12/30/1994    Years since quitting: 25.0  . Smokeless tobacco: Never Used  . Tobacco comment: smoking cessation materials not required  Substance Use Topics  . Alcohol use: No    Alcohol/week: 0.0 standard drinks  . Drug use: No    Family History Family History  Problem Relation Age of Onset  . Heart disease Father   . Lung cancer Brother   . Prostate cancer Brother   . Diabetes Mother   . Heart disease Mother   . Heart attack Mother   . Congestive Heart Failure Mother     Allergies  Allergen Reactions  . Entresto [Sacubitril-Valsartan] Other (See Comments)    Low blood pressure  . Penicillins  Other (See Comments)    Other reaction(s): Unknown, pt does not know reactions     REVIEW OF SYSTEMS (Negative unless checked)  Constitutional: [] Weight loss  [] Fever  [] Chills Cardiac: [] Chest pain   [] Chest pressure   [] Palpitations   [] Shortness of breath when laying flat   [] Shortness of breath with exertion. Vascular:  [x] Pain in legs with walking   [x] Pain in legs at rest  [] History of DVT   [] Phlebitis   [] Swelling in legs   [] Varicose veins   [] Non-healing ulcers Pulmonary:   [] Uses home oxygen   [] Productive cough   [] Hemoptysis   [] Wheeze  [] COPD   []   Asthma Neurologic:  [] Dizziness   [] Seizures   [] History of stroke   [] History of TIA  [] Aphasia   [] Vissual changes   [] Weakness or numbness in arm   [] Weakness or numbness in leg Musculoskeletal:   [] Joint swelling   [] Joint pain   [] Low back pain Hematologic:  [] Easy bruising  [] Easy bleeding   [] Hypercoagulable state   [] Anemic Gastrointestinal:  [] Diarrhea   [] Vomiting  [] Gastroesophageal reflux/heartburn   [] Difficulty swallowing. Genitourinary:  [] Chronic kidney disease   [] Difficult urination  [] Frequent urination   [] Blood in urine Skin:  [] Rashes   [] Ulcers  Psychological:  [] History of anxiety   []  History of major depression.  Physical Examination  Vitals:   01/24/20 0847  BP: 102/66  Pulse: 92  Resp: 16  Weight: 179 lb 3.2 oz (81.3 kg)   Body mass index is 26.46 kg/m. Gen: WD/WN, NAD Head: [place]/AT, No temporalis wasting.  Ear/Nose/Throat: Hearing grossly intact, nares w/o erythema or drainage Eyes: PER, EOMI, sclera nonicteric.  Neck: Supple, no large masses.   Pulmonary:  Good air movement, no audible wheezing bilaterally, no use of accessory muscles.  Cardiac: RRR, no JVD Vascular:  Vessel Right Left  Radial Palpable Palpable  PT Not Palpable Trace Palpable  DP Palpable Not Palpable  Gastrointestinal: Non-distended. No guarding/no peritoneal signs.  Musculoskeletal: M/S 5/5 throughout.  No deformity or  atrophy.  Neurologic: CN 2-12 intact. Symmetrical.  Speech is fluent. Motor exam as listed above. Psychiatric: Judgment intact, Mood & affect appropriate for pt's clinical situation. Dermatologic: No rashes or ulcers noted.  No changes consistent with cellulitis. Lymph : No lichenification or skin changes of chronic lymphedema.  CBC Lab Results  Component Value Date   WBC 8.5 11/20/2019   HGB 14.6 11/20/2019   HCT 44.0 11/20/2019   MCV 92.6 11/20/2019   PLT 146 (L) 11/20/2019    BMET    Component Value Date/Time   NA 139 11/22/2019 1305   NA 142 10/18/2019 1002   NA 142 10/11/2014 0414   K 3.9 11/22/2019 1305   K 4.1 10/11/2014 0414   CL 99 11/22/2019 1305   CL 107 10/11/2014 0414   CO2 25 11/22/2019 1305   CO2 27 10/11/2014 0414   GLUCOSE 305 (H) 11/22/2019 1305   GLUCOSE 114 (H) 10/11/2014 0414   BUN 37 (H) 11/22/2019 1305   BUN 23 10/18/2019 1002   BUN 16 10/11/2014 0414   CREATININE 1.10 11/22/2019 1305   CREATININE 1.19 10/11/2014 0414   CALCIUM 8.5 (L) 11/22/2019 1305   CALCIUM 7.8 (L) 10/11/2014 0414   GFRNONAA >60 11/22/2019 1305   GFRNONAA >60 10/11/2014 0414   GFRAA >60 11/22/2019 1305   GFRAA >60 10/11/2014 0414   CrCl cannot be calculated (Patient's most recent lab result is older than the maximum 21 days allowed.).  COAG Lab Results  Component Value Date   INR 1.0 11/17/2019   INR 0.93 02/02/2017    Radiology No results found.   Assessment/Plan 1. PAD (peripheral artery disease) (HCC)  Recommend:  The patient has evidence of atherosclerosis of the lower extremities with claudication.  The patient does not voice lifestyle limiting changes at this point in time.  Noninvasive studies do not suggest clinically significant change.  No invasive studies, angiography or surgery at this time The patient should continue walking and begin a more formal exercise program.  The patient should continue antiplatelet therapy and aggressive treatment of the  lipid abnormalities  No changes in the patient's medications  at this time  - VAS [place] ABI WITH/WO TBI; Future  2. Essential hypertension Continue antihypertensive medications as already ordered, these medications have been reviewed and there are no changes at this time.   3. Coronary artery disease involving native coronary artery of native heart with angina pectoris (HCC) Continue cardiac and antihypertensive medications as already ordered and reviewed, no changes at this time.  Continue statin as ordered and reviewed, no changes at this time  Nitrates PRN for chest pain   4. Chronic obstructive pulmonary disease, unspecified COPD type (HCC) Continue pulmonary medications and aerosols as already ordered, these medications have been reviewed and there are no changes at this time.    5. Type II diabetes mellitus with complication (HCC) Continue hypoglycemic medications as already ordered, these medications have been reviewed and there are no changes at this time.  Hgb A1C to be monitored as already arranged by primary service    Levora Dredge, MD  01/24/2020 8:54 AM

## 2020-01-25 ENCOUNTER — Encounter (INDEPENDENT_AMBULATORY_CARE_PROVIDER_SITE_OTHER): Payer: Self-pay | Admitting: Vascular Surgery

## 2020-02-03 ENCOUNTER — Ambulatory Visit: Payer: Medicare Other | Admitting: Internal Medicine

## 2020-02-07 ENCOUNTER — Other Ambulatory Visit: Payer: Self-pay | Admitting: Internal Medicine

## 2020-02-07 DIAGNOSIS — M539 Dorsopathy, unspecified: Secondary | ICD-10-CM

## 2020-02-14 ENCOUNTER — Other Ambulatory Visit: Payer: Self-pay | Admitting: Internal Medicine

## 2020-02-14 DIAGNOSIS — M539 Dorsopathy, unspecified: Secondary | ICD-10-CM

## 2020-03-01 ENCOUNTER — Ambulatory Visit: Payer: Medicare Other | Admitting: Internal Medicine

## 2020-03-01 ENCOUNTER — Encounter: Payer: Self-pay | Admitting: Internal Medicine

## 2020-03-01 ENCOUNTER — Ambulatory Visit (INDEPENDENT_AMBULATORY_CARE_PROVIDER_SITE_OTHER): Payer: Medicare Other | Admitting: Internal Medicine

## 2020-03-01 ENCOUNTER — Other Ambulatory Visit: Payer: Self-pay

## 2020-03-01 ENCOUNTER — Other Ambulatory Visit: Payer: Self-pay | Admitting: Internal Medicine

## 2020-03-01 VITALS — BP 118/72 | HR 92 | Temp 97.0°F | Ht 69.0 in | Wt 178.0 lb

## 2020-03-01 DIAGNOSIS — I25119 Atherosclerotic heart disease of native coronary artery with unspecified angina pectoris: Secondary | ICD-10-CM | POA: Diagnosis not present

## 2020-03-01 DIAGNOSIS — I10 Essential (primary) hypertension: Secondary | ICD-10-CM

## 2020-03-01 DIAGNOSIS — M539 Dorsopathy, unspecified: Secondary | ICD-10-CM | POA: Diagnosis not present

## 2020-03-01 DIAGNOSIS — E1169 Type 2 diabetes mellitus with other specified complication: Secondary | ICD-10-CM

## 2020-03-01 DIAGNOSIS — E118 Type 2 diabetes mellitus with unspecified complications: Secondary | ICD-10-CM

## 2020-03-01 DIAGNOSIS — J449 Chronic obstructive pulmonary disease, unspecified: Secondary | ICD-10-CM | POA: Diagnosis not present

## 2020-03-01 DIAGNOSIS — E785 Hyperlipidemia, unspecified: Secondary | ICD-10-CM | POA: Diagnosis not present

## 2020-03-01 DIAGNOSIS — I5022 Chronic systolic (congestive) heart failure: Secondary | ICD-10-CM | POA: Diagnosis not present

## 2020-03-01 MED ORDER — GABAPENTIN 100 MG PO CAPS: 100.0000 mg | ORAL_CAPSULE | Freq: Every day | ORAL | 0 refills | Status: DC

## 2020-03-01 MED ORDER — BUDESONIDE-FORMOTEROL FUMARATE 160-4.5 MCG/ACT IN AERO: 1.0000 | INHALATION_SPRAY | Freq: Two times a day (BID) | RESPIRATORY_TRACT | 12 refills | Status: DC

## 2020-03-01 MED ORDER — IBUPROFEN 800 MG PO TABS: 800.0000 mg | ORAL_TABLET | Freq: Three times a day (TID) | ORAL | 0 refills | Status: DC | PRN

## 2020-03-01 NOTE — Patient Instructions (Addendum)
Schedule diabetic eye exam - call them to see if you due

## 2020-03-01 NOTE — Progress Notes (Signed)
Date:  03/01/2020   Name:  Gary Gutierrez   DOB:  1951-07-31   MRN:  [id number]   Chief Complaint: Diabetes (4 month follow up.) and Hypertension  Diabetes He presents for his follow-up diabetic visit. He has type 2 diabetes mellitus. His disease course has been stable. Pertinent negatives for hypoglycemia include no nervousness/anxiousness. Pertinent negatives for diabetes include no chest pain, no fatigue and no weakness. Current diabetic treatments: jardiance, kombiglyze, Lanus and glipizide. He is compliant with treatment all of the time. He monitors blood glucose at home 3-4 x per week. His breakfast blood glucose is taken between 6-7 am. His breakfast blood glucose range is generally 90-110 mg/dl. An ACE inhibitor/angiotensin II receptor blocker is not being taken.  Hypertension This is a chronic problem. The problem is controlled. Pertinent negatives include no chest pain, palpitations or shortness of breath. Past treatments include beta blockers.  Back Pain This is a new problem. The current episode started more than 1 month ago. The problem has been gradually improving since onset. The pain is present in the gluteal. The quality of the pain is described as aching. The pain radiates to the right foot. The pain is mild. Associated symptoms include leg pain. Pertinent negatives include no bladder incontinence, bowel incontinence, chest pain, fever or weakness. Risk factors: after working under a deck.  COPD - he has chronic cough and SOB, using combivent with no benefit.  He has seen pulmonary in the past.  No recent need for steroids or antibiotics.   He feels he has mostly recovered from his hospitalization for Covid pneumonia.  Immunization History  Administered Date(s) Administered  . Fluad Quad(high Dose 65+) 10/18/2019  . Influenza, High Dose Seasonal PF 10/10/2017, 10/27/2018  . Influenza,inj,Quad PF,6+ Mos 01/03/2016  . Pneumococcal Conjugate-13 04/10/2016  . Pneumococcal  Polysaccharide-23 06/10/2017  . Tdap 08/16/2013    Lab Results  Component Value Date   CREATININE 1.10 11/22/2019   BUN 37 (H) 11/22/2019   NA 139 11/22/2019   K 3.9 11/22/2019   CL 99 11/22/2019   CO2 25 11/22/2019   Lab Results  Component Value Date   CHOL 150 10/18/2019   HDL 35 (L) 10/18/2019   LDLCALC 84 10/18/2019   TRIG 179 (H) 10/18/2019   CHOLHDL 4.3 10/18/2019   Lab Results  Component Value Date   TSH 2.85 10/11/2014   Lab Results  Component Value Date   HGBA1C 7.0 (H) 11/18/2019     Review of Systems  Constitutional: Negative for chills, fatigue and fever.  Respiratory: Negative for chest tightness, shortness of breath and wheezing.   Cardiovascular: Negative for chest pain, palpitations and leg swelling.  Gastrointestinal: Negative for bowel incontinence.  Genitourinary: Negative for bladder incontinence.  Musculoskeletal: Positive for back pain and gait problem. Negative for joint swelling and myalgias.  Skin: Negative for rash.  Neurological: Negative for weakness.  Psychiatric/Behavioral: Negative for dysphoric mood and sleep disturbance. The patient is not nervous/anxious.     Patient Active Problem List   Diagnosis Date Noted  . Acute on chronic combined systolic and diastolic CHF (congestive heart failure) (HCC) 11/17/2019  . Type II diabetes mellitus with complication (HCC) 10/27/2018  . Chronic obstructive pulmonary disease (HCC) 02/11/2018  . CHF (congestive heart failure) (HCC) 10/29/2017  . Polyneuropathy due to type 2 diabetes mellitus (HCC) 07/01/2017  . PAD (peripheral artery disease) (HCC) 06/30/2017  . Tobacco use disorder, mild, in sustained remission, abuse 04/10/2017  . Hx of colonic  polyps 04/10/2016  . Hearing loss of both ears 01/03/2016  . Hyperlipidemia associated with type 2 diabetes mellitus (HCC) 06/09/2015  . Essential hypertension 06/09/2015  . Multilevel degenerative disc disease 06/09/2015  . Coronary artery disease  involving native coronary artery of native heart with angina pectoris (HCC) 05/09/2014    Allergies  Allergen Reactions  . Entresto [Sacubitril-Valsartan] Other (See Comments)    Low blood pressure  . Penicillins Other (See Comments)    Other reaction(s): Unknown, pt does not know reactions    Past Surgical History:  Procedure Laterality Date  . APPENDECTOMY    . colonscopy  2010   benign polyps  . CORONARY ARTERY BYPASS GRAFT  1995  . CORONARY STENT INTERVENTION N/A 02/03/2017   Procedure: Coronary Stent Intervention;  Surgeon: Alwyn Pea, MD;  Location: [facility] INVASIVE CV LAB;  Service: Cardiovascular;  Laterality: N/A;  . ESOPHAGOGASTRODUODENOSCOPY  2010   normal  . LEFT HEART CATH AND CORONARY ANGIOGRAPHY N/A 02/03/2017   Procedure: Left Heart Cath and Coronary Angiography;  Surgeon: Alwyn Pea, MD;  Location: [facility] INVASIVE CV LAB;  Service: Cardiovascular;  Laterality: N/A;  . PERCUTANEOUS CORONARY STENT INTERVENTION (PCI-S)  2010, 2015   4 stents total    Social History   Tobacco Use  . Smoking status: Former Smoker    Packs/day: 1.50    Years: 29.00    Pack years: 43.50    Types: Cigarettes    Quit date: 12/30/1994    Years since quitting: 25.1  . Smokeless tobacco: Never Used  . Tobacco comment: smoking cessation materials not required  Substance Use Topics  . Alcohol use: No    Alcohol/week: 0.0 standard drinks  . Drug use: No     Medication list has been reviewed and updated.  Current Meds  Medication Sig  . ACCU-CHEK AVIVA PLUS test strip TEST BLOOD SUGAR EVERY DAY AS DIRECTED (Patient taking differently: 1 each by Other route as directed. )  . albuterol (VENTOLIN HFA) 108 (90 Base) MCG/ACT inhaler Inhale 2 puffs into the lungs every 6 (six) hours as needed for wheezing or shortness of breath.  . allopurinol (ZYLOPRIM) 300 MG tablet Take 1 tablet (300 mg total) by mouth daily.  Marland Kitchen aspirin EC 81 MG tablet Take 81 mg by mouth daily.   Marland Kitchen atorvastatin  (LIPITOR) 80 MG tablet TAKE 1 TABLET(80 MG) BY MOUTH AT BEDTIME (Patient taking differently: Take 80 mg by mouth at bedtime. )  . carvedilol (COREG) 6.25 MG tablet Take 6.25 mg by mouth 2 (two) times daily with a meal.  . cetirizine (ZYRTEC) 10 MG tablet Take 10 mg by mouth daily.  . clopidogrel (PLAVIX) 75 MG tablet TAKE 1 TABLET(75 MG) BY MOUTH DAILY (Patient taking differently: Take 75 mg by mouth daily. )  . fluticasone (FLONASE) 50 MCG/ACT nasal spray   . glipiZIDE (GLUCOTROL XL) 10 MG 24 hr tablet TAKE 1 TABLET(10 MG) BY MOUTH DAILY (Patient taking differently: Take 10 mg by mouth daily. )  . Insulin Pen Needle (PEN NEEDLES 31GX5/16") 31G X 8 MM MISC 1 each by Does not apply route daily.  . Ipratropium-Albuterol (COMBIVENT) 20-100 MCG/ACT AERS respimat Inhale 1 puff into the lungs every 6 (six) hours.  . isosorbide mononitrate (IMDUR) 30 MG 24 hr tablet Take 1 tablet (30 mg total) by mouth daily.  Marland Kitchen JARDIANCE 25 MG TABS tablet TAKE 1 TABLET BY MOUTH DAILY  . KOMBIGLYZE XR 2.04-999 MG TB24 TAKE 2 TABLETS BY MOUTH DAILY (Patient  taking differently: Take 2 tablets by mouth daily. )  . LANTUS SOLOSTAR 100 UNIT/ML Solostar Pen ADMINISTER 50 UNITS UNDER THE SKIN DAILY (Patient taking differently: 35 Units. )  . nitroGLYCERIN (NITROSTAT) 0.4 MG SL tablet Place 1 tablet (0.4 mg total) under the tongue every 5 (five) minutes as needed for chest pain.  . pantoprazole (PROTONIX) 40 MG tablet TAKE 1 TABLET(40 MG) BY MOUTH TWICE DAILY  . potassium chloride SA (K-DUR,KLOR-CON) 20 MEQ tablet TAKE 2 TABLETS(40 MEQ) BY MOUTH DAILY (Patient taking differently: Take 40 mEq by mouth daily. )  . torsemide (DEMADEX) 20 MG tablet TAKE 2 TABLETS(40 MG) BY MOUTH TWICE DAILY (Patient taking differently: Take 80 mg by mouth 2 (two) times daily. )    PHQ 2/9 Scores 03/01/2020 01/18/2020 04/22/2019 07/22/2018  PHQ - 2 Score 0 0 0 0  PHQ- 9 Score 1 - - 0    BP Readings from Last 3 Encounters:  03/01/20 118/72  01/24/20  102/66  01/18/20 118/72    Physical Exam Vitals and nursing note reviewed.  Constitutional:      General: He is not in acute distress.    Appearance: Normal appearance. He is well-developed.  HENT:     Head: Normocephalic and atraumatic.  Cardiovascular:     Rate and Rhythm: Normal rate and regular rhythm.     Pulses: Normal pulses.  Pulmonary:     Effort: Pulmonary effort is normal. No respiratory distress.     Breath sounds: Wheezing present. No rhonchi.  Musculoskeletal:     Cervical back: Normal range of motion.     Lumbar back: Bony tenderness present. Decreased range of motion. Positive right straight leg raise test. Negative left straight leg raise test.     Right lower leg: No edema.     Left lower leg: No edema.  Lymphadenopathy:     Cervical: No cervical adenopathy.  Skin:    General: Skin is warm and dry.     Findings: No rash.  Neurological:     Mental Status: He is alert and oriented to person, place, and time.  Psychiatric:        Attention and Perception: Attention normal.        Mood and Affect: Mood normal.        Behavior: Behavior normal.        Thought Content: Thought content normal.     Wt Readings from Last 3 Encounters:  03/01/20 178 lb (80.7 kg)  01/24/20 179 lb 3.2 oz (81.3 kg)  01/18/20 178 lb (80.7 kg)    BP 118/72   Pulse 92   Temp (!) 97 F (36.1 C) (Temporal)   Ht 5\' 9"  (1.753 m)   Wt 178 lb (80.7 kg)   SpO2 97%   BMI 26.29 kg/m   Assessment and Plan: 1. Type II diabetes mellitus with complication (HCC) Clinically stable by exam and report without s/s of hypoglycemia. DM complicated by CAD, Lipids, HTN. Tolerating medications  well without side effects or other concerns. - Hemoglobin A1c - Microalbumin / creatinine urine ratio  2. Chronic systolic (congestive) heart failure (HCC) Stable chronic symptoms Followed by cardiology  3. Essential hypertension Clinically stable exam with well controlled BP on coreg. Tolerating  medications without side effects at this time. Pt to continue current regimen and low sodium diet; benefits of regular exercise as able discussed. - Comprehensive metabolic panel  4. Hyperlipidemia associated with type 2 diabetes mellitus ([place]) Tolerating high dose statin therapy with LDL  84  5. Multilevel degenerative disc disease Symptoms worsened by recent construction work Resume advil as needed only Add gabapentin 100 mg at bedtime - ibuprofen (ADVIL) 800 MG tablet; Take 1 tablet (800 mg total) by mouth every 8 (eight) hours as needed for mild pain or moderate pain. TAKE 1 TABLET(800 MG) BY MOUTH EVERY 8 HOURS AS NEEDED  Dispense: 60 tablet; Refill: 0 - gabapentin (NEURONTIN) 100 MG capsule; Take 1 capsule (100 mg total) by mouth at bedtime. For back pain/sciatica  Dispense: 30 capsule; Refill: 0  6. Chronic obstructive pulmonary disease, unspecified COPD type (HCC) Pt reports not benefit from combivent - presumably given due to cost consideration He would like to try Symbicort - budesonide-formoterol (SYMBICORT) 160-4.5 MCG/ACT inhaler; Inhale 1 puff into the lungs 2 (two) times daily.  Dispense: 1 Inhaler; Refill: 12   Partially dictated using Animal nutritionist. Any errors are unintentional.  Bari Edward, MD [facility]  03/01/2020

## 2020-03-02 LAB — COMPREHENSIVE METABOLIC PANEL
ALT: 18 IU/L (ref 0–44)
AST: 21 IU/L (ref 0–40)
Albumin/Globulin Ratio: 1.3 (ref 1.2–2.2)
Albumin: 4.2 g/dL (ref 3.8–4.8)
Alkaline Phosphatase: 176 IU/L — ABNORMAL HIGH (ref 39–117)
BUN/Creatinine Ratio: 16 (ref 10–24)
BUN: 21 mg/dL (ref 8–27)
Bilirubin Total: 1 mg/dL (ref 0.0–1.2)
CO2: 26 mmol/L (ref 20–29)
Calcium: 9.6 mg/dL (ref 8.6–10.2)
Chloride: 98 mmol/L (ref 96–106)
Creatinine, Ser: 1.34 mg/dL — ABNORMAL HIGH (ref 0.76–1.27)
GFR calc Af Amer: 62 mL/min/{1.73_m2} (ref >59)
GFR calc non Af Amer: 54 mL/min/{1.73_m2} — ABNORMAL LOW (ref >59)
Globulin, Total: 3.2 g/dL (ref 1.5–4.5)
Glucose: 128 mg/dL — ABNORMAL HIGH (ref 65–99)
Potassium: 4.1 mmol/L (ref 3.5–5.2)
Sodium: 142 mmol/L (ref 134–144)
Total Protein: 7.4 g/dL (ref 6.0–8.5)

## 2020-03-02 LAB — HEMOGLOBIN A1C
Est. average glucose Bld gHb Est-mCnc: 146 mg/dL
Hgb A1c MFr Bld: 6.7 % — ABNORMAL HIGH (ref 4.8–5.6)

## 2020-03-02 LAB — MICROALBUMIN / CREATININE URINE RATIO
Creatinine, Urine: 27.4 mg/dL
Microalb/Creat Ratio: <11 mg/g creat (ref 0–29)
Microalbumin, Urine: <3 ug/mL

## 2020-04-10 DIAGNOSIS — F411 Generalized anxiety disorder: Secondary | ICD-10-CM | POA: Diagnosis not present

## 2020-04-10 DIAGNOSIS — R600 Localized edema: Secondary | ICD-10-CM | POA: Diagnosis not present

## 2020-04-10 DIAGNOSIS — R0600 Dyspnea, unspecified: Secondary | ICD-10-CM | POA: Diagnosis not present

## 2020-04-10 DIAGNOSIS — R05 Cough: Secondary | ICD-10-CM | POA: Diagnosis not present

## 2020-04-10 DIAGNOSIS — J849 Interstitial pulmonary disease, unspecified: Secondary | ICD-10-CM | POA: Diagnosis not present

## 2020-04-12 ENCOUNTER — Other Ambulatory Visit: Payer: Self-pay | Admitting: Internal Medicine

## 2020-04-12 DIAGNOSIS — M539 Dorsopathy, unspecified: Secondary | ICD-10-CM

## 2020-04-15 ENCOUNTER — Other Ambulatory Visit: Payer: Self-pay | Admitting: Internal Medicine

## 2020-04-19 ENCOUNTER — Ambulatory Visit (INDEPENDENT_AMBULATORY_CARE_PROVIDER_SITE_OTHER): Payer: Medicare Other

## 2020-04-19 VITALS — Ht 69.0 in | Wt 175.8 lb

## 2020-04-19 DIAGNOSIS — Z87891 Personal history of nicotine dependence: Secondary | ICD-10-CM

## 2020-04-19 DIAGNOSIS — Z0000 Encounter for general adult medical examination without abnormal findings: Secondary | ICD-10-CM | POA: Diagnosis not present

## 2020-04-19 DIAGNOSIS — Z1211 Encounter for screening for malignant neoplasm of colon: Secondary | ICD-10-CM | POA: Diagnosis not present

## 2020-04-19 NOTE — Addendum Note (Signed)
Addended by: [name] D on: 04/19/2020 09:47 AM   Modules accepted: Orders

## 2020-04-19 NOTE — Patient Instructions (Signed)
Mr. Gary Gutierrez , Thank you for taking time to come for your Medicare Wellness Visit. I appreciate your ongoing commitment to your health goals. Please review the following plan we discussed and let me know if I can assist you in the future.   Screening recommendations/referrals: Colonoscopy: done 06/18/18. Repeat 2024. Recommended yearly ophthalmology/optometry visit for glaucoma screening and checkup Recommended yearly dental visit for hygiene and checkup  Vaccinations: Influenza vaccine: done 10/18/19 Pneumococcal vaccine: done 06/10/17 Tdap vaccine: done 08/16/13 Shingles vaccine: Shingrix discussed. Please contact your pharmacy for coverage information.   Advanced directives: Advance directive discussed with you today. I have provided a copy for you to complete at home and have notarized. Once this is complete please bring a copy in to our office so we can scan it into your chart.  Conditions/risks identified: Recommend increasing physical activity as tolerated  Next appointment: Please follow up in one year for your Medicare Annual Wellness visit.    Preventive Care 70 Years and Older, Male Preventive care refers to lifestyle choices and visits with your health care provider that can promote health and wellness. What does preventive care include?  A yearly physical exam. This is also called an annual well check.  Dental exams once or twice a year.  Routine eye exams. Ask your health care provider how often you should have your eyes checked.  Personal lifestyle choices, including:  Daily care of your teeth and gums.  Regular physical activity.  Eating a healthy diet.  Avoiding tobacco and drug use.  Limiting alcohol use.  Practicing safe sex.  Taking low doses of aspirin every day.  Taking vitamin and mineral supplements as recommended by your health care provider. What happens during an annual well check? The services and screenings done by your health care provider  during your annual well check will depend on your age, overall health, lifestyle risk factors, and family history of disease. Counseling  Your health care provider may ask you questions about your:  Alcohol use.  Tobacco use.  Drug use.  Emotional well-being.  Home and relationship well-being.  Sexual activity.  Eating habits.  History of falls.  Memory and ability to understand (cognition).  Work and work Astronomer. Screening  You may have the following tests or measurements:  Height, weight, and BMI.  Blood pressure.  Lipid and cholesterol levels. These may be checked every 5 years, or more frequently if you are over 20 years old.  Skin check.  Lung cancer screening. You may have this screening every year starting at age 70 if you have a 30-pack-year history of smoking and currently smoke or have quit within the past 15 years.  Fecal occult blood test (FOBT) of the stool. You may have this test every year starting at age 70.  Flexible sigmoidoscopy or colonoscopy. You may have a sigmoidoscopy every 5 years or a colonoscopy every 10 years starting at age 70.  Prostate cancer screening. Recommendations will vary depending on your family history and other risks.  Hepatitis C blood test.  Hepatitis B blood test.  Sexually transmitted disease (STD) testing.  Diabetes screening. This is done by checking your blood sugar (glucose) after you have not eaten for a while (fasting). You may have this done every 1-3 years.  Abdominal aortic aneurysm (AAA) screening. You may need this if you are a current or former smoker.  Osteoporosis. You may be screened starting at age 70 if you are at high risk. Talk with your health care provider  about your test results, treatment options, and if necessary, the need for more tests. Vaccines  Your health care provider may recommend certain vaccines, such as:  Influenza vaccine. This is recommended every year.  Tetanus,  diphtheria, and acellular pertussis (Tdap, Td) vaccine. You may need a Td booster every 10 years.  Zoster vaccine. You may need this after age 70.  Pneumococcal 13-valent conjugate (PCV13) vaccine. One dose is recommended after age 70.  Pneumococcal polysaccharide (PPSV23) vaccine. One dose is recommended after age 70. Talk to your health care provider about which screenings and vaccines you need and how often you need them. This information is not intended to replace advice given to you by your health care provider. Make sure you discuss any questions you have with your health care provider. Document Released: 01/12/2016 Document Revised: 09/04/2016 Document Reviewed: 10/17/2015 Elsevier Interactive Patient Education  2017 [place] Prevention in the Home Falls can cause injuries. They can happen to people of all ages. There are many things you can do to make your home safe and to help prevent falls. What can I do on the outside of my home?  Regularly fix the edges of walkways and driveways and fix any cracks.  Remove anything that might make you trip as you walk through a door, such as a raised step or threshold.  Trim any bushes or trees on the path to your home.  Use bright outdoor lighting.  Clear any walking paths of anything that might make someone trip, such as rocks or tools.  Regularly check to see if handrails are loose or broken. Make sure that both sides of any steps have handrails.  Any raised decks and porches should have guardrails on the edges.  Have any leaves, snow, or ice cleared regularly.  Use sand or salt on walking paths during winter.  Clean up any spills in your garage right away. This includes oil or grease spills. What can I do in the bathroom?  Use night lights.  Install grab bars by the toilet and in the tub and shower. Do not use towel bars as grab bars.  Use non-skid mats or decals in the tub or shower.  If you need to sit down in  the shower, use a plastic, non-slip stool.  Keep the floor dry. Clean up any water that spills on the floor as soon as it happens.  Remove soap buildup in the tub or shower regularly.  Attach bath mats securely with double-sided non-slip rug tape.  Do not have throw rugs and other things on the floor that can make you trip. What can I do in the bedroom?  Use night lights.  Make sure that you have a light by your bed that is easy to reach.  Do not use any sheets or blankets that are too big for your bed. They should not hang down onto the floor.  Have a firm chair that has side arms. You can use this for support while you get dressed.  Do not have throw rugs and other things on the floor that can make you trip. What can I do in the kitchen?  Clean up any spills right away.  Avoid walking on wet floors.  Keep items that you use a lot in easy-to-reach places.  If you need to reach something above you, use a strong step stool that has a grab bar.  Keep electrical cords out of the way.  Do not use floor polish or  wax that makes floors slippery. If you must use wax, use non-skid floor wax.  Do not have throw rugs and other things on the floor that can make you trip. What can I do with my stairs?  Do not leave any items on the stairs.  Make sure that there are handrails on both sides of the stairs and use them. Fix handrails that are broken or loose. Make sure that handrails are as long as the stairways.  Check any carpeting to make sure that it is firmly attached to the stairs. Fix any carpet that is loose or worn.  Avoid having throw rugs at the top or bottom of the stairs. If you do have throw rugs, attach them to the floor with carpet tape.  Make sure that you have a light switch at the top of the stairs and the bottom of the stairs. If you do not have them, ask someone to add them for you. What else can I do to help prevent falls?  Wear shoes that:  Do not have high  heels.  Have rubber bottoms.  Are comfortable and fit you well.  Are closed at the toe. Do not wear sandals.  If you use a stepladder:  Make sure that it is fully opened. Do not climb a closed stepladder.  Make sure that both sides of the stepladder are locked into place.  Ask someone to hold it for you, if possible.  Clearly mark and make sure that you can see:  Any grab bars or handrails.  First and last steps.  Where the edge of each step is.  Use tools that help you move around (mobility aids) if they are needed. These include:  Canes.  Walkers.  Scooters.  Crutches.  Turn on the lights when you go into a dark area. Replace any light bulbs as soon as they burn out.  Set up your furniture so you have a clear path. Avoid moving your furniture around.  If any of your floors are uneven, fix them.  If there are any pets around you, be aware of where they are.  Review your medicines with your doctor. Some medicines can make you feel dizzy. This can increase your chance of falling. Ask your doctor what other things that you can do to help prevent falls. This information is not intended to replace advice given to you by your health care provider. Make sure you discuss any questions you have with your health care provider. Document Released: 10/12/2009 Document Revised: 05/23/2016 Document Reviewed: 01/20/2015 Elsevier Interactive Patient Education  2017 [employer].

## 2020-04-19 NOTE — Progress Notes (Addendum)
Subjective:   Gary Gutierrez is a 69 y.o. male who presents for an Initial Medicare Annual Wellness Visit.  Virtual Visit via Telephone Note  I connected with Gary Gutierrez on 04/19/20 at  8:40 AM EDT by telephone and verified that I am speaking with the correct person using two identifiers.  Medicare Annual Wellness visit completed telephonically due to Covid-19 pandemic.   Location: Patient: home Provider: office   I discussed the limitations, risks, security and privacy concerns of performing an evaluation and management service by telephone and the availability of in person appointments. The patient expressed understanding and agreed to proceed.  Some vital signs may be absent or patient reported.   [name], LPN    Review of Systems   Cardiac Risk Factors include: diabetes mellitus;dyslipidemia;male gender;hypertension;advanced age (>32men, >25 women)    Objective:    Today's Vitals   04/19/20 0844  Weight: 175 lb 12.8 oz (79.7 kg)  Height: 5\' 9"  (1.753 m)   Body mass index is 25.96 kg/m.  Advanced Directives 04/19/2020 11/19/2019 11/17/2019 06/05/2019 06/05/2019 07/22/2018 10/29/2017  Does Patient Have a Medical Advance Directive? No No No No No No No  Would patient like information on creating a medical advance directive? Yes (MAU/Ambulatory/Procedural Areas - Information given) No - Patient declined - No - Patient declined No - Patient declined Yes (MAU/Ambulatory/Procedural Areas - Information given) No - Patient declined    Current Medications (verified) Outpatient Encounter Medications as of 04/19/2020  Medication Sig  . ACCU-CHEK AVIVA PLUS test strip TEST BLOOD SUGAR EVERY DAY AS DIRECTED (Patient taking differently: 1 each by Other route as directed. )  . albuterol (VENTOLIN HFA) 108 (90 Base) MCG/ACT inhaler Inhale 2 puffs into the lungs every 6 (six) hours as needed for wheezing or shortness of breath.  . allopurinol (ZYLOPRIM) 300 MG tablet Take 1  tablet (300 mg total) by mouth daily.  . Ascorbic Acid (VITAMIN C) 1000 MG tablet Take 1,000 mg by mouth daily.  04/21/2020 aspirin EC 81 MG tablet Take 81 mg by mouth daily.   Marland Kitchen atorvastatin (LIPITOR) 80 MG tablet TAKE 1 TABLET(80 MG) BY MOUTH AT BEDTIME (Patient taking differently: Take 80 mg by mouth at bedtime. )  . budesonide-formoterol (SYMBICORT) 160-4.5 MCG/ACT inhaler Inhale 1 puff into the lungs 2 (two) times daily.  . carvedilol (COREG) 6.25 MG tablet Take 6.25 mg by mouth 2 (two) times daily with a meal.  . cetirizine (ZYRTEC) 10 MG tablet Take 10 mg by mouth daily.  . clopidogrel (PLAVIX) 75 MG tablet Take 1 tablet (75 mg total) by mouth daily.  . fluticasone (FLONASE) 50 MCG/ACT nasal spray   . gabapentin (NEURONTIN) 100 MG capsule TAKE ONE CAPSULE BY MOUTH AT BEDTIME FOR BACK PAIN/SCIATICA  . glipiZIDE (GLUCOTROL XL) 10 MG 24 hr tablet Take 1 tablet (10 mg total) by mouth daily.  . Insulin Pen Needle (PEN NEEDLES 31GX5/16") 31G X 8 MM MISC 1 each by Does not apply route daily.  Marland Kitchen JARDIANCE 25 MG TABS tablet TAKE 1 TABLET BY MOUTH DAILY  . LANTUS SOLOSTAR 100 UNIT/ML Solostar Pen ADMINISTER 50 UNITS UNDER THE SKIN DAILY (Patient taking differently: 35 Units. )  . nitroGLYCERIN (NITROSTAT) 0.4 MG SL tablet Place 1 tablet (0.4 mg total) under the tongue every 5 (five) minutes as needed for chest pain.  . pantoprazole (PROTONIX) 40 MG tablet TAKE 1 TABLET(40 MG) BY MOUTH TWICE DAILY  . potassium chloride SA (K-DUR,KLOR-CON) 20 MEQ tablet TAKE 2  TABLETS(40 MEQ) BY MOUTH DAILY (Patient taking differently: Take 40 mEq by mouth daily. )  . Saxagliptin-Metformin (KOMBIGLYZE XR) 2.04-999 MG TB24 Take 2 tablets by mouth daily.  Marland Kitchen. torsemide (DEMADEX) 20 MG tablet TAKE 2 TABLETS(40 MG) BY MOUTH TWICE DAILY (Patient taking differently: Take 80 mg by mouth 2 (two) times daily. )  . zinc gluconate 50 MG tablet Take 50 mg by mouth daily.  Marland Kitchen. ALPRAZolam (XANAX) 0.25 MG tablet Take 0.25 mg by mouth 3 (three)  times daily as needed.  . isosorbide mononitrate (IMDUR) 30 MG 24 hr tablet Take 1 tablet (30 mg total) by mouth daily.  . [DISCONTINUED] ibuprofen (ADVIL) 800 MG tablet Take 1 tablet (800 mg total) by mouth every 8 (eight) hours as needed for mild pain or moderate pain. TAKE 1 TABLET(800 MG) BY MOUTH EVERY 8 HOURS AS NEEDED  . [DISCONTINUED] ibuprofen (ADVIL,MOTRIN) 800 MG tablet Take 1 tablet (800 mg total) by mouth every 8 (eight) hours as needed.   No facility-administered encounter medications on file as of 04/19/2020.    Allergies (verified) Entresto [sacubitril-valsartan] and Penicillins   History: Past Medical History:  Diagnosis Date  . Acute respiratory failure due to COVID-19 (HCC) 11/19/2019  . Allergy   . CHF (congestive heart failure) (HCC)   . Coronary artery disease   . Diabetes mellitus without complication (HCC)   . Hyperlipidemia   . Hypertension   . Myocardial infarction (HCC)   . Pneumonia due to COVID-19 virus 11/19/2019   Past Surgical History:  Procedure Laterality Date  . APPENDECTOMY    . colonscopy  2010   benign polyps  . CORONARY ARTERY BYPASS GRAFT  1995  . CORONARY STENT INTERVENTION N/A 02/03/2017   Procedure: Coronary Stent Intervention;  Surgeon: Alwyn Peawayne D Callwood, MD;  Location: [facility] INVASIVE CV LAB;  Service: Cardiovascular;  Laterality: N/A;  . ESOPHAGOGASTRODUODENOSCOPY  2010   normal  . LEFT HEART CATH AND CORONARY ANGIOGRAPHY N/A 02/03/2017   Procedure: Left Heart Cath and Coronary Angiography;  Surgeon: Alwyn Peawayne D Callwood, MD;  Location: [facility] INVASIVE CV LAB;  Service: Cardiovascular;  Laterality: N/A;  . PERCUTANEOUS CORONARY STENT INTERVENTION (PCI-S)  2010, 2015   4 stents total   Family History  Problem Relation Age of Onset  . Heart disease Father   . Lung cancer Brother   . Prostate cancer Brother   . Diabetes Mother   . Heart disease Mother   . Heart attack Mother   . Congestive Heart Failure Mother    Social History    Socioeconomic History  . Marital status: Legally Separated    Spouse name: Not on file  . Number of children: 8  . Years of education: Not on file  . Highest education level: 11th grade  Occupational History  . Occupation: Retired  Tobacco Use  . Smoking status: Former Smoker    Packs/day: 1.50    Years: 29.00    Pack years: 43.50    Types: Cigarettes    Quit date: 12/30/1994    Years since quitting: 25.3  . Smokeless tobacco: Never Used  . Tobacco comment: smoking cessation materials not required  Substance and Sexual Activity  . Alcohol use: No    Alcohol/week: 0.0 standard drinks  . Drug use: No  . Sexual activity: Not Currently  Other Topics Concern  . Not on file  Social History Narrative  . Not on file   Social Determinants of Health   Financial Resource Strain: Medium Risk  .  Difficulty of Paying Living Expenses: Somewhat hard  Food Insecurity: No Food Insecurity  . Worried About Programme researcher, broadcasting/film/video in the Last Year: Never true  . Ran Out of Food in the Last Year: Never true  Transportation Needs: No Transportation Needs  . Lack of Transportation (Medical): No  . Lack of Transportation (Non-Medical): No  Physical Activity: Inactive  . Days of Exercise per Week: 0 days  . Minutes of Exercise per Session: 0 min  Stress: No Stress Concern Present  . Feeling of Stress : Only a little  Social Connections: Unknown  . Frequency of Communication with Friends and Family: Patient refused  . Frequency of Social Gatherings with Friends and Family: Patient refused  . Attends Religious Services: Patient refused  . Active Member of Clubs or Organizations: Patient refused  . Attends Banker Meetings: Patient refused  . Marital Status: Married   Tobacco Counseling Counseling given: Not Answered Comment: smoking cessation materials not required   Clinical Intake:  Pre-visit preparation completed: Yes  Pain : No/denies pain     BMI - recorded:  25.96 Nutritional Status: BMI 25 -29 Overweight Nutritional Risks: None Diabetes: Yes CBG done?: No Did pt. bring in CBG monitor from home?: No   Nutrition Risk Assessment:  Has the patient had any N/V/D within the last 2 months?  No  Does the patient have any non-healing wounds?  No  Has the patient had any unintentional weight loss or weight gain?  No   Diabetes:  Is the patient diabetic?  Yes  If diabetic, was a CBG obtained today?  No  Did the patient bring in their glucometer from home?  No  How often do you monitor your CBG's? Twice daily.   Financial Strains and Diabetes Management:  Are you having any financial strains with the device, your supplies or your medication? No .  Does the patient want to be seen by Chronic Care Management for management of their diabetes?  No  Would the patient like to be referred to a Nutritionist or for Diabetic Management?  No   Diabetic Exams:  Diabetic Eye Exam: Completed per patient in 2020. Records requested from [facility].  Diabetic Foot Exam: Completed 04/22/19.    How often do you need to have someone help you when you read instructions, pamphlets, or other written materials from your doctor or pharmacy?: 1 - Never  Interpreter Needed?: No  Information entered by :: [name] LPN  Activities of Daily Living In your present state of health, do you have any difficulty performing the following activities: 04/19/2020 11/19/2019  Hearing? Y N  Comment declines hearing aids -  Vision? N N  Difficulty concentrating or making decisions? N N  Walking or climbing stairs? N N  Dressing or bathing? N N  Doing errands, shopping? N N  Preparing Food and eating ? N -  Using the Toilet? N -  In the past six months, have you accidently leaked urine? N -  Do you have problems with loss of bowel control? N -  Managing your Medications? N -  Managing your Finances? N -  Housekeeping or managing your Housekeeping? N -  Some  recent data might be hidden     Immunizations and Health Maintenance Immunization History  Administered Date(s) Administered  . Fluad Quad(high Dose 65+) 10/18/2019  . Influenza, High Dose Seasonal PF 10/10/2017, 10/27/2018  . Influenza,inj,Quad PF,6+ Mos 01/03/2016  . Pneumococcal Conjugate-13 04/10/2016  . Pneumococcal  Polysaccharide-23 06/10/2017  . Tdap 08/16/2013   Health Maintenance Due  Topic Date Due  . COVID-19 Vaccine (1) Never done  . OPHTHALMOLOGY EXAM  09/05/2019    Patient Care Team: Glean Hess, MD as PCP - General (Internal Medicine) Lucilla Lame, MD as Consulting Physician (Gastroenterology) Alisa Graff, FNP as Nurse Practitioner (Cardiology) Yolonda Kida, MD as Consulting Physician (Cardiology) Schnier, Dolores Lory, MD (Vascular Surgery) Eulogio Bear, MD as Consulting Physician (Ophthalmology)  Indicate any recent Medical Services you may have received from other than [facility] providers in the past year (date may be approximate).    Assessment:   This is a routine wellness examination for Shubh.  Hearing/Vision screen  Hearing Screening   125Hz  250Hz  500Hz  1000Hz  2000Hz  3000Hz  4000Hz  6000Hz  8000Hz   Right ear:           Left ear:           Comments: Pt c/o hearing difficulty, needs hearing evaluation to assess for hearing aids     Vision Screening Comments: [facility] vision screenings done at [facility]  Dietary issues and exercise activities discussed: Current Exercise Habits: The patient does not participate in regular exercise at present, Exercise limited by: cardiac condition(s);respiratory conditions(s)  Goals    . DIET - INCREASE WATER INTAKE     Recommend to drink at least 6-8 8oz glasses of water per day.      Depression Screen PHQ 2/9 Scores 04/19/2020 03/01/2020 01/18/2020 04/22/2019  PHQ - 2 Score 0 0 0 0  PHQ- 9 Score - 1 - -    Fall Risk Fall Risk  04/19/2020 04/22/2019 01/12/2019 07/22/2018 07/14/2018  Falls in the  past year? 0 0 0 Yes No  Comment - - - slipped on ice -  Number falls in past yr: 0 0 0 1 -  Injury with Fall? 0 0 0 No -  Risk for fall due to : No Fall Risks - - Impaired vision;Medication side effect -  Risk for fall due to: Comment - - - wears eyeglasses -  Follow up Falls prevention discussed Falls evaluation completed - Falls evaluation completed;Education provided;Falls prevention discussed -    FALL RISK PREVENTION PERTAINING TO THE HOME:  Any stairs in or around the home? Yes  If so, do they handrails? Yes   Home free of loose throw rugs in walkways, pet beds, electrical cords, etc? Yes  Adequate lighting in your home to reduce risk of falls? Yes   ASSISTIVE DEVICES UTILIZED TO PREVENT FALLS:  Life alert? No  Use of a cane, walker or w/c? No  Grab bars in the bathroom? No  Shower chair or bench in shower? Yes  Elevated toilet seat or a handicapped toilet? Yes   DME ORDERS:  DME order needed?  No   TIMED UP AND GO:  Was the test performed? No . Telephonic visit.   Education: Fall risk prevention has been discussed.  Intervention(s) required? No   Cognitive Function:     6CIT Screen 04/19/2020 07/22/2018 06/10/2017  What Year? 0 points 0 points 0 points  What month? 0 points 0 points 0 points  What time? 0 points 0 points 0 points  Count back from 20 0 points 0 points 0 points  Months in reverse 4 points 2 points 0 points  Repeat phrase 2 points 2 points 4 points  Total Score 6 4 4     Screening Tests Health Maintenance  Topic Date Due  . COVID-19  Vaccine (1) Never done  . OPHTHALMOLOGY EXAM  09/05/2019  . FOOT EXAM  04/21/2020  . INFLUENZA VACCINE  07/30/2020  . HEMOGLOBIN A1C  09/01/2020  . URINE MICROALBUMIN  03/01/2021  . COLONOSCOPY  06/19/2023  . TETANUS/TDAP  08/17/2023  . Hepatitis C Screening  Completed  . PNA vac Low Risk Adult  Completed    Qualifies for Shingles Vaccine? Yes . Due for Shingrix. Education has been provided regarding the  importance of this vaccine. Pt has been advised to call insurance company to determine out of pocket expense. Advised may also receive vaccine at local pharmacy or Health Dept. Verbalized acceptance and understanding.  Tdap: Up to date  Flu Vaccine: Up to date  Pneumococcal Vaccine: Up to date  Cancer Screenings:  Colorectal Screening: Completed 2010. Pt was scheduled to repeat in 2019 but procedure was canceled. Repeat every 5 years; Referral sent today for repeat screening colonoscopy today. Pt aware they will contact him for appt.   Reason for Referral:  Has the referral been discussed with the patient?: yes  Has the patient seen a specialist for this issue before?: .  If so, who (practice/provider)? Yes- in [place]  Does the patient have a provider or location preference for the referral?: . Would the patient like to see previous specialist if applicable? [facility] [place]    Lung Cancer Screening: (Low Dose CT Chest recommended if Age 61-80 years, 30 pack-year currently smoking OR have quit w/in 15years.) does not qualify.   Additional Screening:  Hepatitis C Screening: does qualify; Completed 06/10/17  Vision Screening: Recommended annual ophthalmology exams for early detection of glaucoma and other disorders of the eye. Is the patient up to date with their annual eye exam?  Yes  Who is the provider or what is the name of the office in which the pt attends annual eye exams? [facility]  Dental Screening: Recommended annual dental exams for proper oral hygiene  Community Resource Referral:  CRR required this visit?  No      Plan:      I have personally reviewed and addressed the Medicare Annual Wellness questionnaire and have noted the following in the patient's chart:  A. Medical and social history B. Use of alcohol, tobacco or illicit drugs  C. Current medications and supplements D. Functional ability and status E.  Nutritional status F.  Physical  activity G. Advance directives H. List of other physicians I.  Hospitalizations, surgeries, and ER visits in previous 12 months J.  Vitals K. Screenings such as hearing and vision if needed, cognitive and depression L. Referrals and appointments   In addition, I have reviewed and discussed with patient certain preventive protocols, quality metrics, and best practice recommendations. A written personalized care plan for preventive services as well as general preventive health recommendations were provided to patient.   Signed,  [name], LPN Nurse Health Advisor   Nurse Notes: pt c/o low energy, stamina and having to rest even when walking short distances. Has appt with cardiology 04/24/20.

## 2020-04-24 ENCOUNTER — Telehealth: Payer: Self-pay | Admitting: Internal Medicine

## 2020-04-24 DIAGNOSIS — Z951 Presence of aortocoronary bypass graft: Secondary | ICD-10-CM | POA: Diagnosis not present

## 2020-04-24 DIAGNOSIS — J449 Chronic obstructive pulmonary disease, unspecified: Secondary | ICD-10-CM | POA: Diagnosis not present

## 2020-04-24 DIAGNOSIS — I509 Heart failure, unspecified: Secondary | ICD-10-CM | POA: Diagnosis not present

## 2020-04-24 DIAGNOSIS — I5022 Chronic systolic (congestive) heart failure: Secondary | ICD-10-CM | POA: Diagnosis not present

## 2020-04-24 DIAGNOSIS — Z955 Presence of coronary angioplasty implant and graft: Secondary | ICD-10-CM | POA: Diagnosis not present

## 2020-04-24 DIAGNOSIS — I10 Essential (primary) hypertension: Secondary | ICD-10-CM | POA: Diagnosis not present

## 2020-04-24 DIAGNOSIS — E7849 Other hyperlipidemia: Secondary | ICD-10-CM | POA: Diagnosis not present

## 2020-04-24 DIAGNOSIS — I429 Cardiomyopathy, unspecified: Secondary | ICD-10-CM | POA: Diagnosis not present

## 2020-04-24 DIAGNOSIS — R0602 Shortness of breath: Secondary | ICD-10-CM | POA: Diagnosis not present

## 2020-04-24 DIAGNOSIS — I2510 Atherosclerotic heart disease of native coronary artery without angina pectoris: Secondary | ICD-10-CM | POA: Diagnosis not present

## 2020-04-24 DIAGNOSIS — I208 Other forms of angina pectoris: Secondary | ICD-10-CM | POA: Diagnosis not present

## 2020-04-24 NOTE — Telephone Encounter (Unsigned)
Copied from CRM [id number]. Topic: General - Call Back - No Documentation >> Apr 24, 2020  3:36 PM Randol Kern wrote: Reason for CRM: [facility] is requesting a call back for clarification from Clinic/ PCP Best contact: [phone]

## 2020-04-25 NOTE — Telephone Encounter (Signed)
Called and left a VM on patients phone telling him to schedule his eye exam with [facility] as soon as possible.   CM

## 2020-04-25 NOTE — Telephone Encounter (Signed)
Called and spoke with Gary Gutierrez at [facility]. She said she received a request from our office for his most recent eye exam. His last eye exam was 2 years ago Sept 2019. Told her we already have this report so I will call the patient and tell him to schedule an eye exam appt asap.  She verbalized understanding.  CM

## 2020-05-04 DIAGNOSIS — I2510 Atherosclerotic heart disease of native coronary artery without angina pectoris: Secondary | ICD-10-CM | POA: Diagnosis not present

## 2020-05-04 DIAGNOSIS — R600 Localized edema: Secondary | ICD-10-CM | POA: Diagnosis not present

## 2020-05-04 DIAGNOSIS — Z951 Presence of aortocoronary bypass graft: Secondary | ICD-10-CM | POA: Diagnosis not present

## 2020-05-04 DIAGNOSIS — I429 Cardiomyopathy, unspecified: Secondary | ICD-10-CM | POA: Diagnosis not present

## 2020-05-04 DIAGNOSIS — I10 Essential (primary) hypertension: Secondary | ICD-10-CM | POA: Diagnosis not present

## 2020-05-04 DIAGNOSIS — Z955 Presence of coronary angioplasty implant and graft: Secondary | ICD-10-CM | POA: Diagnosis not present

## 2020-05-04 DIAGNOSIS — J449 Chronic obstructive pulmonary disease, unspecified: Secondary | ICD-10-CM | POA: Diagnosis not present

## 2020-05-04 DIAGNOSIS — E7849 Other hyperlipidemia: Secondary | ICD-10-CM | POA: Diagnosis not present

## 2020-05-04 DIAGNOSIS — I5023 Acute on chronic systolic (congestive) heart failure: Secondary | ICD-10-CM | POA: Diagnosis not present

## 2020-05-04 DIAGNOSIS — I208 Other forms of angina pectoris: Secondary | ICD-10-CM | POA: Diagnosis not present

## 2020-05-04 DIAGNOSIS — K219 Gastro-esophageal reflux disease without esophagitis: Secondary | ICD-10-CM | POA: Diagnosis not present

## 2020-05-11 DIAGNOSIS — R000 Tachycardia, unspecified: Secondary | ICD-10-CM | POA: Diagnosis not present

## 2020-05-11 DIAGNOSIS — R001 Bradycardia, unspecified: Secondary | ICD-10-CM | POA: Diagnosis not present

## 2020-05-11 DIAGNOSIS — I429 Cardiomyopathy, unspecified: Secondary | ICD-10-CM | POA: Diagnosis not present

## 2020-05-11 DIAGNOSIS — Z951 Presence of aortocoronary bypass graft: Secondary | ICD-10-CM | POA: Diagnosis not present

## 2020-05-11 DIAGNOSIS — I2510 Atherosclerotic heart disease of native coronary artery without angina pectoris: Secondary | ICD-10-CM | POA: Diagnosis not present

## 2020-05-11 DIAGNOSIS — R05 Cough: Secondary | ICD-10-CM | POA: Diagnosis not present

## 2020-05-11 DIAGNOSIS — Z955 Presence of coronary angioplasty implant and graft: Secondary | ICD-10-CM | POA: Diagnosis not present

## 2020-05-11 DIAGNOSIS — I208 Other forms of angina pectoris: Secondary | ICD-10-CM | POA: Diagnosis not present

## 2020-05-11 DIAGNOSIS — R600 Localized edema: Secondary | ICD-10-CM | POA: Diagnosis not present

## 2020-05-11 DIAGNOSIS — I10 Essential (primary) hypertension: Secondary | ICD-10-CM | POA: Diagnosis not present

## 2020-05-11 DIAGNOSIS — I5023 Acute on chronic systolic (congestive) heart failure: Secondary | ICD-10-CM | POA: Diagnosis not present

## 2020-05-11 DIAGNOSIS — J449 Chronic obstructive pulmonary disease, unspecified: Secondary | ICD-10-CM | POA: Diagnosis not present

## 2020-05-19 DIAGNOSIS — I509 Heart failure, unspecified: Secondary | ICD-10-CM | POA: Diagnosis not present

## 2020-05-22 ENCOUNTER — Other Ambulatory Visit: Payer: Self-pay | Admitting: Internal Medicine

## 2020-05-22 DIAGNOSIS — I5022 Chronic systolic (congestive) heart failure: Secondary | ICD-10-CM

## 2020-05-22 MED ORDER — NITROGLYCERIN 0.4 MG SL SUBL: 0.4000 mg | SUBLINGUAL_TABLET | SUBLINGUAL | 1 refills | Status: DC | PRN

## 2020-05-22 NOTE — Telephone Encounter (Signed)
Requested Prescriptions  Pending Prescriptions Disp Refills  . nitroGLYCERIN (NITROSTAT) 0.4 MG SL tablet 30 tablet 1    Sig: Place 1 tablet (0.4 mg total) under the tongue every 5 (five) minutes as needed for chest pain.     Cardiovascular:  Nitrates Passed - 05/22/2020  1:56 PM      Passed - Last BP in normal range    BP Readings from Last 1 Encounters:  03/01/20 118/72         Passed - Last Heart Rate in normal range    Pulse Readings from Last 1 Encounters:  03/01/20 92         Passed - Valid encounter within last 12 months    Recent Outpatient Visits          2 months ago Type II diabetes mellitus with complication [facility])   [facility] [name], MD   4 months ago Cellulitis of toe of left foot   [facility] [name], MD   7 months ago Chronic systolic congestive heart failure [facility])   [facility] [name], MD   1 year ago Type II diabetes mellitus with complication [facility])   [facility] [name], MD   1 year ago Essential hypertension   [facility] [name], MD      Future Appointments            In 1 month [name], Nyoka Cowden, MD [facility], [facility]

## 2020-05-22 NOTE — Telephone Encounter (Signed)
Medication Refill - Medication: nitroGLYCERIN (NITROSTAT) 0.4 MG SL tablet    Preferred Pharmacy (with phone number or street name):  [id number] - [place], [place] - [address] [place] [address] AT [facility] OF [address] & [address] Phone:  [phone]  Fax:  [phone]       Agent: Please be advised that RX refills may take up to 3 business days. We ask that you follow-up with your pharmacy.

## 2020-06-14 ENCOUNTER — Other Ambulatory Visit: Payer: Self-pay

## 2020-06-14 MED ORDER — PANTOPRAZOLE SODIUM 40 MG PO TBEC: 40.0000 mg | DELAYED_RELEASE_TABLET | Freq: Two times a day (BID) | ORAL | 1 refills | Status: DC

## 2020-06-27 DIAGNOSIS — R000 Tachycardia, unspecified: Secondary | ICD-10-CM | POA: Diagnosis not present

## 2020-06-27 DIAGNOSIS — R600 Localized edema: Secondary | ICD-10-CM | POA: Diagnosis not present

## 2020-06-27 DIAGNOSIS — I2510 Atherosclerotic heart disease of native coronary artery without angina pectoris: Secondary | ICD-10-CM | POA: Diagnosis not present

## 2020-06-27 DIAGNOSIS — Z951 Presence of aortocoronary bypass graft: Secondary | ICD-10-CM | POA: Diagnosis not present

## 2020-06-27 DIAGNOSIS — I509 Heart failure, unspecified: Secondary | ICD-10-CM | POA: Diagnosis not present

## 2020-06-27 DIAGNOSIS — I25810 Atherosclerosis of coronary artery bypass graft(s) without angina pectoris: Secondary | ICD-10-CM | POA: Diagnosis not present

## 2020-06-27 DIAGNOSIS — I214 Non-ST elevation (NSTEMI) myocardial infarction: Secondary | ICD-10-CM | POA: Diagnosis not present

## 2020-06-27 DIAGNOSIS — R079 Chest pain, unspecified: Secondary | ICD-10-CM | POA: Diagnosis not present

## 2020-06-27 DIAGNOSIS — Z955 Presence of coronary angioplasty implant and graft: Secondary | ICD-10-CM | POA: Diagnosis not present

## 2020-06-27 DIAGNOSIS — R001 Bradycardia, unspecified: Secondary | ICD-10-CM | POA: Diagnosis not present

## 2020-06-27 DIAGNOSIS — I429 Cardiomyopathy, unspecified: Secondary | ICD-10-CM | POA: Diagnosis not present

## 2020-06-27 DIAGNOSIS — J449 Chronic obstructive pulmonary disease, unspecified: Secondary | ICD-10-CM | POA: Diagnosis not present

## 2020-07-05 ENCOUNTER — Encounter: Payer: Self-pay | Admitting: Internal Medicine

## 2020-07-05 DIAGNOSIS — I700 Atherosclerosis of aorta: Secondary | ICD-10-CM | POA: Insufficient documentation

## 2020-07-06 ENCOUNTER — Ambulatory Visit: Payer: Medicare Other | Admitting: Internal Medicine

## 2020-07-06 DIAGNOSIS — E1122 Type 2 diabetes mellitus with diabetic chronic kidney disease: Secondary | ICD-10-CM | POA: Insufficient documentation

## 2020-07-06 DIAGNOSIS — N183 Chronic kidney disease, stage 3 unspecified: Secondary | ICD-10-CM | POA: Insufficient documentation

## 2020-07-06 NOTE — Progress Notes (Deleted)
Date:  07/06/2020   Name:  Gary Gutierrez   DOB:  1951-01-27   MRN:  [id number]   Chief Complaint: No chief complaint on file.  Diabetes He presents for his follow-up diabetic visit. He has type 2 diabetes mellitus. His disease course has been stable. Current diabetic treatments: tradjenta, metformin, glipizide, insulin and jardiance. An ACE inhibitor/angiotensin II receptor blocker is not being taken.  Hypertension This is a chronic problem. The problem is controlled. Hypertensive end-organ damage includes kidney disease, CAD/MI and heart failure.  Hyperlipidemia This is a chronic problem. The problem is controlled. Exacerbating diseases include diabetes. Current antihyperlipidemic treatment includes statins. There are no compliance problems.  Risk factors: known CAD, aortic atherosclerosis.    Lab Results  Component Value Date   CREATININE 1.34 (H) 03/01/2020   BUN 21 03/01/2020   NA 142 03/01/2020   K 4.1 03/01/2020   CL 98 03/01/2020   CO2 26 03/01/2020   Lab Results  Component Value Date   CHOL 150 10/18/2019   HDL 35 (L) 10/18/2019   LDLCALC 84 10/18/2019   TRIG 179 (H) 10/18/2019   CHOLHDL 4.3 10/18/2019   Lab Results  Component Value Date   TSH 2.85 10/11/2014   Lab Results  Component Value Date   HGBA1C 6.7 (H) 03/01/2020   Lab Results  Component Value Date   WBC 8.5 11/20/2019   HGB 14.6 11/20/2019   HCT 44.0 11/20/2019   MCV 92.6 11/20/2019   PLT 146 (L) 11/20/2019   Lab Results  Component Value Date   ALT 18 03/01/2020   AST 21 03/01/2020   ALKPHOS 176 (H) 03/01/2020   BILITOT 1.0 03/01/2020     Review of Systems  Patient Active Problem List   Diagnosis Date Noted  . CKD stage 3 secondary to diabetes (HCC) 07/06/2020  . Thoracic aortic atherosclerosis (HCC) 07/05/2020  . Acute on chronic combined systolic and diastolic CHF (congestive heart failure) (HCC) 11/17/2019  . Type II diabetes mellitus with complication (HCC) 10/27/2018  .  Chronic obstructive pulmonary disease (HCC) 02/11/2018  . CHF (congestive heart failure) (HCC) 10/29/2017  . Polyneuropathy due to type 2 diabetes mellitus (HCC) 07/01/2017  . PAD (peripheral artery disease) (HCC) 06/30/2017  . Tobacco use disorder, mild, in sustained remission, abuse 04/10/2017  . Hx of colonic polyps 04/10/2016  . Hearing loss of both ears 01/03/2016  . Hyperlipidemia associated with type 2 diabetes mellitus (HCC) 06/09/2015  . Essential hypertension 06/09/2015  . Multilevel degenerative disc disease 06/09/2015  . Coronary artery disease involving native coronary artery of native heart with angina pectoris (HCC) 05/09/2014  . Coronary artery disease involving autologous vein bypass graft 05/09/2014    Allergies  Allergen Reactions  . Entresto [Sacubitril-Valsartan] Other (See Comments)    Low blood pressure  . Penicillins Other (See Comments)    Other reaction(s): Unknown, pt does not know reactions    Past Surgical History:  Procedure Laterality Date  . APPENDECTOMY    . colonscopy  2010   benign polyps  . CORONARY ARTERY BYPASS GRAFT  1995  . CORONARY STENT INTERVENTION N/A 02/03/2017   Procedure: Coronary Stent Intervention;  Surgeon: Alwyn Pea, MD;  Location: [facility] INVASIVE CV LAB;  Service: Cardiovascular;  Laterality: N/A;  . ESOPHAGOGASTRODUODENOSCOPY  2010   normal  . LEFT HEART CATH AND CORONARY ANGIOGRAPHY N/A 02/03/2017   Procedure: Left Heart Cath and Coronary Angiography;  Surgeon: Alwyn Pea, MD;  Location: [facility] INVASIVE CV LAB;  Service: Cardiovascular;  Laterality: N/A;  . PERCUTANEOUS CORONARY STENT INTERVENTION (PCI-S)  2010, 2015   4 stents total    Social History   Tobacco Use  . Smoking status: Former Smoker    Packs/day: 1.50    Years: 29.00    Pack years: 43.50    Types: Cigarettes    Quit date: 12/30/1994    Years since quitting: 25.5  . Smokeless tobacco: Never Used  . Tobacco comment: smoking cessation materials not  required  Vaping Use  . Vaping Use: Never used  Substance Use Topics  . Alcohol use: No    Alcohol/week: 0.0 standard drinks  . Drug use: No     Medication list has been reviewed and updated.  No outpatient medications have been marked as taking for the 07/06/20 encounter (Appointment) with [name], MD.    [facility] 2/9 Scores 04/19/2020 03/01/2020 01/18/2020 04/22/2019  PHQ - 2 Score 0 0 0 0  PHQ- 9 Score - 1 - -    No flowsheet data found.  BP Readings from Last 3 Encounters:  03/01/20 118/72  01/24/20 102/66  01/18/20 118/72    Physical Exam  Wt Readings from Last 3 Encounters:  04/19/20 175 lb 12.8 oz (79.7 kg)  03/01/20 178 lb (80.7 kg)  01/24/20 179 lb 3.2 oz (81.3 kg)    There were no vitals taken for this visit.  Assessment and Plan:

## 2020-07-18 ENCOUNTER — Other Ambulatory Visit: Payer: Self-pay | Admitting: Internal Medicine

## 2020-07-18 DIAGNOSIS — I5022 Chronic systolic (congestive) heart failure: Secondary | ICD-10-CM

## 2020-08-04 DIAGNOSIS — I214 Non-ST elevation (NSTEMI) myocardial infarction: Secondary | ICD-10-CM | POA: Diagnosis not present

## 2020-08-04 DIAGNOSIS — I25810 Atherosclerosis of coronary artery bypass graft(s) without angina pectoris: Secondary | ICD-10-CM | POA: Diagnosis not present

## 2020-08-04 DIAGNOSIS — Z87891 Personal history of nicotine dependence: Secondary | ICD-10-CM | POA: Diagnosis not present

## 2020-08-04 DIAGNOSIS — Z136 Encounter for screening for cardiovascular disorders: Secondary | ICD-10-CM | POA: Diagnosis not present

## 2020-08-19 ENCOUNTER — Other Ambulatory Visit: Payer: Self-pay | Admitting: Internal Medicine

## 2020-08-19 NOTE — Telephone Encounter (Signed)
Requested Prescriptions  Pending Prescriptions Disp Refills  . LANTUS SOLOSTAR 100 UNIT/ML Solostar Pen [Pharmacy Med Name: LANTUS SOLOSTAR PEN INJ 3ML] 15 mL 0    Sig: ADMINISTER 50 UNITS UNDER THE SKIN DAILY     Endocrinology:  Diabetes - Insulins Passed - 08/19/2020 11:53 AM      Passed - HBA1C is between 0 and 7.9 and within 180 days    Hemoglobin A1C  Date Value Ref Range Status  10/11/2014 6.9 (H) 4.2 - 6.3 % Final    Comment:    The American Diabetes Association recommends that a primary goal of therapy should be <7% and that physicians should reevaluate the treatment regimen in patients with HbA1c values consistently >8%.    Hgb A1c MFr Bld  Date Value Ref Range Status  03/01/2020 6.7 (H) 4.8 - 5.6 % Final    Comment:             Prediabetes: 5.7 - 6.4          Diabetes: >6.4          Glycemic control for adults with diabetes: <7.0          Passed - Valid encounter within last 6 months    Recent Outpatient Visits          5 months ago Type II diabetes mellitus with complication [facility])   [facility] [name], MD   7 months ago Cellulitis of toe of left foot   [facility] [name], MD   10 months ago Chronic systolic congestive heart failure [facility])   [facility] [name], MD   1 year ago Type II diabetes mellitus with complication [facility])   [facility] [name], MD   1 year ago Essential hypertension   [facility] [name], MD

## 2020-09-21 ENCOUNTER — Other Ambulatory Visit: Payer: Self-pay | Admitting: Internal Medicine

## 2020-09-21 DIAGNOSIS — I5022 Chronic systolic (congestive) heart failure: Secondary | ICD-10-CM

## 2020-09-21 NOTE — Telephone Encounter (Addendum)
Refill request for lanuts solistar pen; no valid encounter within last 6 months; pt notified; decision tree completed; pt offered and accepted appt with Dr Bari Edward, [facility] 09/28/20 at 0800; he verbalized understanding; courtesy refill to cover pt until appt granted; will route to office for notification.  Requested Prescriptions  Pending Prescriptions Disp Refills  . nitroGLYCERIN (NITROSTAT) 0.4 MG SL tablet [Pharmacy Med Name: NITROGLYCERIN 0.4MG  SUB TAB 25] 25 tablet 1    Sig: PLACE 1 TABLET UNDER THE TONGUE EVERY 5 MINUTES AS NEEDED FOR CHEST PAIN     Cardiovascular:  Nitrates Passed - 09/21/2020  3:26 PM      Passed - Last BP in normal range    BP Readings from Last 1 Encounters:  03/01/20 118/72         Passed - Last Heart Rate in normal range    Pulse Readings from Last 1 Encounters:  03/01/20 92         Passed - Valid encounter within last 12 months    Recent Outpatient Visits          6 months ago Type II diabetes mellitus with complication [facility])   [facility] [name], MD   8 months ago Cellulitis of toe of left foot   [facility] [name], MD   11 months ago Chronic systolic congestive heart failure [facility])   [facility] [name], MD   1 year ago Type II diabetes mellitus with complication [facility])   [facility] [name], MD   1 year ago Essential hypertension   [facility] [name], MD      Future Appointments            In 1 week [name] Nyoka Cowden, MD [facility]           . LANTUS SOLOSTAR 100 UNIT/ML Solostar Pen [Pharmacy Med Name: LANTUS SOLOSTAR PEN INJ 3ML] 15 mL 0    Sig: ADMINISTER 50 UNITS UNDER THE SKIN DAILY     Endocrinology:  Diabetes - Insulins Failed - 09/21/2020  3:26 PM      Failed - HBA1C is between 0 and 7.9 and within 180 days    Hemoglobin A1C  Date Value Ref Range Status  10/11/2014 6.9 (H) 4.2 - 6.3 % Final     Comment:    The American Diabetes Association recommends that a primary goal of therapy should be <7% and that physicians should reevaluate the treatment regimen in patients with HbA1c values consistently >8%.    Hgb A1c MFr Bld  Date Value Ref Range Status  03/01/2020 6.7 (H) 4.8 - 5.6 % Final    Comment:             Prediabetes: 5.7 - 6.4          Diabetes: >6.4          Glycemic control for adults with diabetes: <7.0          Failed - Valid encounter within last 6 months    Recent Outpatient Visits          6 months ago Type II diabetes mellitus with complication [facility])   [facility] [name], MD   8 months ago Cellulitis of toe of left foot   [facility] [name], MD   11 months ago Chronic systolic congestive heart failure [facility])   [facility],  Nyoka Cowden, MD   1 year ago Type II diabetes mellitus with complication [facility])   [facility] [name], MD   1 year ago Essential hypertension   [facility] [name], MD      Future Appointments            In 1 week [name] Nyoka Cowden, MD [facility], [facility]

## 2020-09-28 ENCOUNTER — Other Ambulatory Visit: Payer: Self-pay

## 2020-09-28 ENCOUNTER — Encounter: Payer: Self-pay | Admitting: Internal Medicine

## 2020-09-28 ENCOUNTER — Ambulatory Visit (INDEPENDENT_AMBULATORY_CARE_PROVIDER_SITE_OTHER): Payer: Medicare Other | Admitting: Internal Medicine

## 2020-09-28 VITALS — BP 124/70 | HR 96 | Ht 69.0 in | Wt 191.0 lb

## 2020-09-28 DIAGNOSIS — Z23 Encounter for immunization: Secondary | ICD-10-CM | POA: Diagnosis not present

## 2020-09-28 DIAGNOSIS — E1169 Type 2 diabetes mellitus with other specified complication: Secondary | ICD-10-CM

## 2020-09-28 DIAGNOSIS — M109 Gout, unspecified: Secondary | ICD-10-CM

## 2020-09-28 DIAGNOSIS — E785 Hyperlipidemia, unspecified: Secondary | ICD-10-CM | POA: Diagnosis not present

## 2020-09-28 DIAGNOSIS — E1142 Type 2 diabetes mellitus with diabetic polyneuropathy: Secondary | ICD-10-CM

## 2020-09-28 DIAGNOSIS — M539 Dorsopathy, unspecified: Secondary | ICD-10-CM | POA: Diagnosis not present

## 2020-09-28 DIAGNOSIS — I25119 Atherosclerotic heart disease of native coronary artery with unspecified angina pectoris: Secondary | ICD-10-CM

## 2020-09-28 DIAGNOSIS — E118 Type 2 diabetes mellitus with unspecified complications: Secondary | ICD-10-CM

## 2020-09-28 MED ORDER — IBUPROFEN 800 MG PO TABS: 800.0000 mg | ORAL_TABLET | Freq: Every day | ORAL | 2 refills | Status: DC | PRN

## 2020-09-28 NOTE — Progress Notes (Signed)
Date:  09/28/2020   Name:  Gary Gutierrez   DOB:  [birth year]   MRN:  [id number]   Chief Complaint: Diabetes (4 month follow up. This morning his BS was 182. Took his injection last night.) and Flu Vaccine (High dose flu shot.)  Diabetes He presents for his follow-up diabetic visit. He has type 2 diabetes mellitus. His disease course has been stable. Hypoglycemia symptoms include headaches. Pertinent negatives for hypoglycemia include no dizziness, nervousness/anxiousness or tremors. Pertinent negatives for diabetes include no chest pain, no fatigue, no polydipsia and no polyuria. Current diabetic treatments: glucotrol, kombiglyze, jardiance and insulin - cardiology stopped kombiglyze. He is compliant with treatment most of the time. His weight is stable. His breakfast blood glucose is taken between 6-7 am. His breakfast blood glucose range is generally 90-110 mg/dl. His bedtime blood glucose is taken between 8-9 pm. His bedtime blood glucose range is generally >200 mg/dl. An ACE inhibitor/angiotensin II receptor blocker is being taken. Eye exam is not current.  Toe Pain  There was no injury mechanism. The pain is present in the left toes. The quality of the pain is described as aching. The pain is mild. Pertinent negatives include no numbness. The symptoms are aggravated by movement and weight bearing. Treatments tried: advil and allopurinol. The treatment provided moderate relief.  Hyperlipidemia This is a chronic problem. The problem is controlled. Pertinent negatives include no chest pain or shortness of breath. Current antihyperlipidemic treatment includes statins. The current treatment provides significant improvement of lipids.    Lab Results  Component Value Date   CREATININE 1.34 (H) 03/01/2020   BUN 21 03/01/2020   NA 142 03/01/2020   K 4.1 03/01/2020   CL 98 03/01/2020   CO2 26 03/01/2020   Lab Results  Component Value Date   CHOL 150 10/18/2019   HDL 35 (L) 10/18/2019    LDLCALC 84 10/18/2019   TRIG 179 (H) 10/18/2019   CHOLHDL 4.3 10/18/2019   Lab Results  Component Value Date   TSH 2.85 10/11/2014   Lab Results  Component Value Date   HGBA1C 6.7 (H) 03/01/2020   Lab Results  Component Value Date   WBC 8.5 11/20/2019   HGB 14.6 11/20/2019   HCT 44.0 11/20/2019   MCV 92.6 11/20/2019   PLT 146 (L) 11/20/2019   Lab Results  Component Value Date   ALT 18 03/01/2020   AST 21 03/01/2020   ALKPHOS 176 (H) 03/01/2020   BILITOT 1.0 03/01/2020     Review of Systems  Constitutional: Negative for appetite change, fatigue and unexpected weight change.  Eyes: Negative for visual disturbance.  Respiratory: Positive for cough and chest tightness. Negative for shortness of breath and wheezing.   Cardiovascular: Negative for chest pain, palpitations and leg swelling.  Gastrointestinal: Negative for abdominal pain and blood in stool.  Endocrine: Negative for polydipsia and polyuria.  Genitourinary: Negative for dysuria and hematuria.  Musculoskeletal: Positive for arthralgias and back pain.  Skin: Negative for color change and rash.  Neurological: Positive for headaches. Negative for dizziness, tremors and numbness.  Psychiatric/Behavioral: Negative for dysphoric mood and sleep disturbance. The patient is not nervous/anxious.     Patient Active Problem List   Diagnosis Date Noted   CKD stage 3 secondary to diabetes (HCC) 07/06/2020   Thoracic aortic atherosclerosis (HCC) 07/05/2020   Acute on chronic combined systolic and diastolic CHF (congestive heart failure) (HCC) 11/17/2019   Type II diabetes mellitus with complication (HCC) 10/27/2018  Chronic obstructive pulmonary disease (HCC) 02/11/2018   CHF (congestive heart failure) (HCC) 10/29/2017   Polyneuropathy due to type 2 diabetes mellitus (HCC) 07/01/2017   PAD (peripheral artery disease) (HCC) 06/30/2017   Tobacco use disorder, mild, in sustained remission, abuse 04/10/2017   Hx of  colonic polyps 04/10/2016   Hearing loss of both ears 01/03/2016   Hyperlipidemia associated with type 2 diabetes mellitus (HCC) 06/09/2015   Essential hypertension 06/09/2015   Multilevel degenerative disc disease 06/09/2015   Coronary artery disease involving native coronary artery of native heart with angina pectoris (HCC) 05/09/2014   Coronary artery disease involving autologous vein bypass graft 05/09/2014    Allergies  Allergen Reactions   Entresto [Sacubitril-Valsartan] Other (See Comments)    Low blood pressure   Penicillins Other (See Comments)    Other reaction(s): Unknown, pt does not know reactions    Past Surgical History:  Procedure Laterality Date   APPENDECTOMY     colonscopy  2010   benign polyps   CORONARY ARTERY BYPASS GRAFT  1995   CORONARY STENT INTERVENTION N/A 02/03/2017   Procedure: Coronary Stent Intervention;  Surgeon: Alwyn Pea, MD;  Location: [facility] INVASIVE CV LAB;  Service: Cardiovascular;  Laterality: N/A;   ESOPHAGOGASTRODUODENOSCOPY  2010   normal   LEFT HEART CATH AND CORONARY ANGIOGRAPHY N/A 02/03/2017   Procedure: Left Heart Cath and Coronary Angiography;  Surgeon: Alwyn Pea, MD;  Location: [facility] INVASIVE CV LAB;  Service: Cardiovascular;  Laterality: N/A;   PERCUTANEOUS CORONARY STENT INTERVENTION (PCI-S)  2010, 2015   4 stents total    Social History   Tobacco Use   Smoking status: Former Smoker    Packs/day: 1.50    Years: 29.00    Pack years: 43.50    Types: Cigarettes    Quit date: 12/30/1994    Years since quitting: 25.7   Smokeless tobacco: Never Used   Tobacco comment: smoking cessation materials not required  Vaping Use   Vaping Use: Never used  Substance Use Topics   Alcohol use: No    Alcohol/week: 0.0 standard drinks   Drug use: No     Medication list has been reviewed and updated.  Current Meds  Medication Sig   ACCU-CHEK AVIVA PLUS test strip TEST BLOOD SUGAR EVERY DAY AS DIRECTED  (Patient taking differently: 1 each by Other route as directed. )   albuterol (VENTOLIN HFA) 108 (90 Base) MCG/ACT inhaler Inhale 2 puffs into the lungs every 6 (six) hours as needed for wheezing or shortness of breath.   allopurinol (ZYLOPRIM) 300 MG tablet Take 1 tablet (300 mg total) by mouth daily.   ALPRAZolam (XANAX) 0.25 MG tablet Take 0.25 mg by mouth 3 (three) times daily as needed.   Ascorbic Acid (VITAMIN C) 1000 MG tablet Take 1,000 mg by mouth daily.   aspirin EC 81 MG tablet Take 81 mg by mouth daily.    atorvastatin (LIPITOR) 80 MG tablet TAKE 1 TABLET(80 MG) BY MOUTH AT BEDTIME (Patient taking differently: Take 80 mg by mouth at bedtime. )   budesonide-formoterol (SYMBICORT) 160-4.5 MCG/ACT inhaler Inhale 1 puff into the lungs 2 (two) times daily.   carvedilol (COREG) 6.25 MG tablet Take 6.25 mg by mouth 2 (two) times daily with a meal.   cetirizine (ZYRTEC) 10 MG tablet Take 10 mg by mouth daily.   clopidogrel (PLAVIX) 75 MG tablet Take 1 tablet (75 mg total) by mouth daily.   fluticasone (FLONASE) 50 MCG/ACT nasal spray  glipiZIDE (GLUCOTROL XL) 10 MG 24 hr tablet Take 1 tablet (10 mg total) by mouth daily.   Insulin Pen Needle (PEN NEEDLES 31GX5/16") 31G X 8 MM MISC 1 each by Does not apply route daily.   JARDIANCE 25 MG TABS tablet TAKE 1 TABLET BY MOUTH DAILY   LANTUS SOLOSTAR 100 UNIT/ML Solostar Pen ADMINISTER 50 UNITS UNDER THE SKIN DAILY (Patient taking differently: Inject 35 Units into the skin daily. )   losartan (COZAAR) 50 MG tablet Take 50 mg by mouth daily.   nitroGLYCERIN (NITROSTAT) 0.4 MG SL tablet PLACE 1 TABLET UNDER THE TONGUE EVERY 5 MINUTES AS NEEDED FOR CHEST PAIN   pantoprazole (PROTONIX) 40 MG tablet Take 1 tablet (40 mg total) by mouth 2 (two) times daily.   potassium chloride SA (K-DUR,KLOR-CON) 20 MEQ tablet TAKE 2 TABLETS(40 MEQ) BY MOUTH DAILY (Patient taking differently: Take 40 mEq by mouth daily. )   spironolactone  (ALDACTONE) 25 MG tablet Take 25 mg by mouth daily.   torsemide (DEMADEX) 100 MG tablet Take by mouth.   zinc gluconate 50 MG tablet Take 50 mg by mouth daily.    PHQ 2/9 Scores 04/19/2020 03/01/2020 01/18/2020 04/22/2019  PHQ - 2 Score 0 0 0 0  PHQ- 9 Score - 1 - -    No flowsheet data found.  BP Readings from Last 3 Encounters:  09/28/20 124/70  03/01/20 118/72  01/24/20 102/66    Physical Exam Vitals and nursing note reviewed.  Constitutional:      General: He is not in acute distress.    Appearance: He is well-developed.  HENT:     Head: Normocephalic and atraumatic.  Neck:     Vascular: No carotid bruit.  Cardiovascular:     Rate and Rhythm: Normal rate and regular rhythm.     Pulses: Normal pulses.  Pulmonary:     Effort: Pulmonary effort is normal. No respiratory distress.     Breath sounds: Rhonchi (RUL) present.  Musculoskeletal:     Cervical back: Normal range of motion.     Right lower leg: No edema.     Left lower leg: No edema.     Right foot: Deformity (bunion) present.     Left foot: Deformity (bunion) present.  Feet:     Right foot:     Skin integrity: Skin integrity normal.     Toenail Condition: Right toenails are normal.     Left foot:     Skin integrity: Skin integrity normal.     Toenail Condition: Left toenails are normal.  Lymphadenopathy:     Cervical: No cervical adenopathy.  Skin:    General: Skin is warm and dry.     Findings: No rash.  Neurological:     General: No focal deficit present.     Mental Status: He is alert and oriented to person, place, and time.     Sensory: No sensory deficit.     Motor: No weakness.  Psychiatric:        Mood and Affect: Mood normal.     Wt Readings from Last 3 Encounters:  09/28/20 191 lb (86.6 kg)  04/19/20 175 lb 12.8 oz (79.7 kg)  03/01/20 178 lb (80.7 kg)    BP 124/70    Pulse 96    Ht 5\' 9"  (1.753 m)    Wt 191 lb (86.6 kg)    SpO2 98%    BMI 28.21 kg/m   Assessment and Plan: 1. Type II  diabetes  mellitus with complication (HCC) Now off of kombiglyze with good control per patient Continue bid finger stick glucoses at home - Comprehensive metabolic panel - Hemoglobin A1c  2. Polyneuropathy due to type 2 diabetes mellitus (HCC) No evidence on exam today No longer taking gabapentin due to lack of efficacy for back pain  3. Hyperlipidemia associated with type 2 diabetes mellitus (HCC) Tolerating statin medication without side effects at this time LDL is at goal of < 70 on current dose Continue same therapy without change at this time. - Lipid panel  4. Multilevel degenerative disc disease May use advil only PRN due to renal concerns - ibuprofen (ADVIL) 800 MG tablet; Take 1 tablet (800 mg total) by mouth daily as needed.  Dispense: 30 tablet; Refill: 2  5. Controlled gout Continue Allopurinol No evidence of active gout today - Uric acid   Partially dictated using Animal nutritionistDragon software. Any errors are unintentional.  Bari EdwardLaura Yazaira Speas, MD [facility]  09/28/2020

## 2020-09-29 LAB — LIPID PANEL
Chol/HDL Ratio: 4.1 ratio (ref 0.0–5.0)
Cholesterol, Total: 147 mg/dL (ref 100–199)
HDL: 36 mg/dL — ABNORMAL LOW (ref >39)
LDL Chol Calc (NIH): 85 mg/dL (ref 0–99)
Triglycerides: 146 mg/dL (ref 0–149)
VLDL Cholesterol Cal: 26 mg/dL (ref 5–40)

## 2020-09-29 LAB — COMPREHENSIVE METABOLIC PANEL
ALT: 25 IU/L (ref 0–44)
AST: 23 IU/L (ref 0–40)
Albumin/Globulin Ratio: 1.3 (ref 1.2–2.2)
Albumin: 4.4 g/dL (ref 3.8–4.8)
Alkaline Phosphatase: 185 IU/L — ABNORMAL HIGH (ref 44–121)
BUN/Creatinine Ratio: 22 (ref 10–24)
BUN: 31 mg/dL — ABNORMAL HIGH (ref 8–27)
Bilirubin Total: 0.8 mg/dL (ref 0.0–1.2)
CO2: 24 mmol/L (ref 20–29)
Calcium: 9.4 mg/dL (ref 8.6–10.2)
Chloride: 97 mmol/L (ref 96–106)
Creatinine, Ser: 1.38 mg/dL — ABNORMAL HIGH (ref 0.76–1.27)
GFR calc Af Amer: 59 mL/min/{1.73_m2} — ABNORMAL LOW (ref >59)
GFR calc non Af Amer: 51 mL/min/{1.73_m2} — ABNORMAL LOW (ref >59)
Globulin, Total: 3.4 g/dL (ref 1.5–4.5)
Glucose: 162 mg/dL — ABNORMAL HIGH (ref 65–99)
Potassium: 4.5 mmol/L (ref 3.5–5.2)
Sodium: 139 mmol/L (ref 134–144)
Total Protein: 7.8 g/dL (ref 6.0–8.5)

## 2020-09-29 LAB — URIC ACID: Uric Acid: 3.5 mg/dL — ABNORMAL LOW (ref 3.8–8.4)

## 2020-09-29 LAB — HEMOGLOBIN A1C
Est. average glucose Bld gHb Est-mCnc: 186 mg/dL
Hgb A1c MFr Bld: 8.1 % — ABNORMAL HIGH (ref 4.8–5.6)

## 2020-10-01 ENCOUNTER — Other Ambulatory Visit: Payer: Self-pay | Admitting: Internal Medicine

## 2020-10-01 DIAGNOSIS — E118 Type 2 diabetes mellitus with unspecified complications: Secondary | ICD-10-CM

## 2020-10-01 MED ORDER — LINAGLIPTIN 5 MG PO TABS: 5.0000 mg | ORAL_TABLET | Freq: Every day | ORAL | 1 refills | Status: DC

## 2020-10-04 ENCOUNTER — Telehealth: Payer: Self-pay | Admitting: Internal Medicine

## 2020-10-04 NOTE — Telephone Encounter (Signed)
linagliptin (TRADJENTA) 5 MG TABS tablet Medication Date: 10/01/2020 Department: [facility] Ordering/Authorizing: [name], MD   Please CB pt received a call from nuse to take this med today and he does not even know what it is for. He has not picked this up and wants clarification CB at [phone]-

## 2020-10-05 ENCOUNTER — Telehealth: Payer: Self-pay

## 2020-10-05 ENCOUNTER — Other Ambulatory Visit: Payer: Self-pay

## 2020-10-05 DIAGNOSIS — E118 Type 2 diabetes mellitus with unspecified complications: Secondary | ICD-10-CM

## 2020-10-05 MED ORDER — LINAGLIPTIN 5 MG PO TABS: 5.0000 mg | ORAL_TABLET | Freq: Every day | ORAL | 1 refills | Status: DC

## 2020-10-05 NOTE — Telephone Encounter (Signed)
Copied from CRM [id number]. Topic: Quick Communication - See Telephone Encounter >> Oct 04, 2020  5:32 PM Aretta Nip wrote: CRM for notification. See Telephone encounter for: 10/04/20.Note linagliptin (TRADJENTA) 5 MG TABS tablet Medication  Date: 10/01/2020 Department: [facility] Ordering/Authorizing: [name], MD  Please CB pt received a call from nuse to take this med today and he does not even know what it is for. He has not picked this up and wants clarification CB at [phone].

## 2020-10-05 NOTE — Telephone Encounter (Signed)
Please call patient again

## 2020-10-05 NOTE — Telephone Encounter (Signed)
Patient informed this is for Diabetes. Gary Gutierrez he has a chest cold. Recommended delsym for his cough and he is already taking dayquil too.  CM

## 2020-10-07 ENCOUNTER — Other Ambulatory Visit: Payer: Self-pay | Admitting: Internal Medicine

## 2020-10-23 ENCOUNTER — Other Ambulatory Visit: Payer: Self-pay | Admitting: Internal Medicine

## 2020-10-23 DIAGNOSIS — I25810 Atherosclerosis of coronary artery bypass graft(s) without angina pectoris: Secondary | ICD-10-CM | POA: Diagnosis not present

## 2020-10-23 DIAGNOSIS — R079 Chest pain, unspecified: Secondary | ICD-10-CM | POA: Diagnosis not present

## 2020-10-23 DIAGNOSIS — Z951 Presence of aortocoronary bypass graft: Secondary | ICD-10-CM | POA: Diagnosis not present

## 2020-10-23 DIAGNOSIS — I214 Non-ST elevation (NSTEMI) myocardial infarction: Secondary | ICD-10-CM | POA: Diagnosis not present

## 2020-10-23 DIAGNOSIS — R001 Bradycardia, unspecified: Secondary | ICD-10-CM | POA: Diagnosis not present

## 2020-10-23 DIAGNOSIS — J449 Chronic obstructive pulmonary disease, unspecified: Secondary | ICD-10-CM | POA: Diagnosis not present

## 2020-10-23 DIAGNOSIS — Z955 Presence of coronary angioplasty implant and graft: Secondary | ICD-10-CM | POA: Diagnosis not present

## 2020-10-23 DIAGNOSIS — I509 Heart failure, unspecified: Secondary | ICD-10-CM | POA: Diagnosis not present

## 2020-10-23 DIAGNOSIS — R600 Localized edema: Secondary | ICD-10-CM | POA: Diagnosis not present

## 2020-10-23 DIAGNOSIS — R000 Tachycardia, unspecified: Secondary | ICD-10-CM | POA: Diagnosis not present

## 2020-10-23 DIAGNOSIS — I4891 Unspecified atrial fibrillation: Secondary | ICD-10-CM | POA: Diagnosis not present

## 2020-10-23 DIAGNOSIS — I429 Cardiomyopathy, unspecified: Secondary | ICD-10-CM | POA: Diagnosis not present

## 2020-10-23 NOTE — Telephone Encounter (Signed)
Requested Prescriptions  Pending Prescriptions Disp Refills  . LANTUS SOLOSTAR 100 UNIT/ML Solostar Pen [Pharmacy Med Name: LANTUS SOLOSTAR PEN INJ 3ML] 15 mL 0    Sig: ADMINISTER 50 UNITS UNDER THE SKIN DAILY     Endocrinology:  Diabetes - Insulins Failed - 10/23/2020  9:01 AM      Failed - HBA1C is between 0 and 7.9 and within 180 days    Hemoglobin A1C  Date Value Ref Range Status  10/11/2014 6.9 (H) 4.2 - 6.3 % Final    Comment:    The American Diabetes Association recommends that a primary goal of therapy should be <7% and that physicians should reevaluate the treatment regimen in patients with HbA1c values consistently >8%.    Hgb A1c MFr Bld  Date Value Ref Range Status  09/28/2020 8.1 (H) 4.8 - 5.6 % Final    Comment:             Prediabetes: 5.7 - 6.4          Diabetes: >6.4          Glycemic control for adults with diabetes: <7.0          Passed - Valid encounter within last 6 months    Recent Outpatient Visits          3 weeks ago Type II diabetes mellitus with complication [facility])   [facility] [name], MD   7 months ago Type II diabetes mellitus with complication [facility])   [facility] [name], MD   9 months ago Cellulitis of toe of left foot   [facility] [name], MD   1 year ago Chronic systolic congestive heart failure [facility])   [facility] [name], MD   1 year ago Type II diabetes mellitus with complication [facility])   [facility] [name], MD      Future Appointments            In 3 months [name] Nyoka Cowden, MD [facility], [facility]

## 2020-11-04 ENCOUNTER — Other Ambulatory Visit: Payer: Self-pay | Admitting: Internal Medicine

## 2020-11-04 NOTE — Telephone Encounter (Signed)
Requested Prescriptions  Pending Prescriptions Disp Refills   ACCU-CHEK AVIVA PLUS test strip [Pharmacy Med Name: ACCU-CHEK AVIVA PLUS STRIPS 100S] 100 strip 3    Sig: TEST BLOOD SUGAR EVERY DAY AS DIRECTED     Endocrinology: Diabetes - Testing Supplies Passed - 11/04/2020  1:14 PM      Passed - Valid encounter within last 12 months    Recent Outpatient Visits          1 month ago Type II diabetes mellitus with complication [facility]   [facility] [name], MD   8 months ago Type II diabetes mellitus with complication [facility])   [facility] [name], MD   9 months ago Cellulitis of toe of left foot   [facility] [name], MD   1 year ago Chronic systolic congestive heart failure [facility])   [facility] [name], MD   1 year ago Type II diabetes mellitus with complication [facility])   [facility] [name], MD      Future Appointments            In 4 days Lanier Prude, MD [facility]   In 3 months [name], Nyoka Cowden, MD [facility], [facility]

## 2020-11-06 ENCOUNTER — Other Ambulatory Visit: Payer: Self-pay | Admitting: Internal Medicine

## 2020-11-08 ENCOUNTER — Other Ambulatory Visit: Payer: Self-pay

## 2020-11-08 ENCOUNTER — Encounter: Payer: Self-pay | Admitting: Cardiology

## 2020-11-08 ENCOUNTER — Ambulatory Visit (INDEPENDENT_AMBULATORY_CARE_PROVIDER_SITE_OTHER): Payer: Medicare Other | Admitting: Cardiology

## 2020-11-08 VITALS — BP 110/70 | HR 88 | Ht 69.0 in | Wt 196.0 lb

## 2020-11-08 DIAGNOSIS — I5022 Chronic systolic (congestive) heart failure: Secondary | ICD-10-CM | POA: Diagnosis not present

## 2020-11-08 DIAGNOSIS — I2510 Atherosclerotic heart disease of native coronary artery without angina pectoris: Secondary | ICD-10-CM

## 2020-11-08 DIAGNOSIS — I255 Ischemic cardiomyopathy: Secondary | ICD-10-CM | POA: Diagnosis not present

## 2020-11-08 NOTE — Patient Instructions (Addendum)
Medication Instructions:  Your physician recommends that you continue on your current medications as directed. Please refer to the Current Medication list given to you today. *If you need a refill on your cardiac medications before your next appointment, please call your pharmacy*  Lab Work: None ordered. If you have labs (blood work) drawn today and your tests are completely normal, you will receive your results only by: Marland Kitchen MyChart Message (if you have MyChart) OR . A paper copy in the mail If you have any lab test that is abnormal or we need to change your treatment, we will call you to review the results.  Testing/Procedures: None ordered.  Follow-Up: At [facility], you and your health needs are our priority.  As part of our continuing mission to provide you with exceptional heart care, we have created designated Provider Care Teams.  These Care Teams include your primary Cardiologist (physician) and Advanced Practice Providers (APPs -  Physician Assistants and Nurse Practitioners) who all work together to provide you with the care you need, when you need it.  Your next appointment:   SEE INSTRUCTION LETTER  ICD PAMPHLET GIVEN

## 2020-11-08 NOTE — Progress Notes (Signed)
Electrophysiology Office Note:    Date:  11/08/2020   ID:  Gary MarionJames W Maule, DOB 04/24/1951, MRN [id number]  PCP:  Reubin MilanBerglund, [name], MD  [facility] Cardiologist:  No primary care provider on file.  [facility] Electrophysiologist:  None   Referring MD: Reubin MilanBerglund, [name], MD   Chief Complaint: Ischemic cardiomyopathy  History of Present Illness:    Gary Gutierrez is a 69 y.o. male who presents for an evaluation of ischemic cardiomyopathy at the request of Dr.  Juliann Paresallwood. Their medical history includes diabetes, GERD, hyperlipidemia, hypertension, COPD, tobacco abuse.  Surgical history includes coronary artery bypass grafting in 1995, angioplasty in 2010.  His ejection fraction reported in a June 27, 2020 outside clinic note was reported as 20 to 25%.  He has been unable to tolerate Entresto in the past.  His right ventricular function has been reported as normal.  There is mild to moderate mitral regurgitation, mild tricuspid regurgitation, moderate left ventricular enlargement.  Today he tells me that he is active and is able to walk short distances without dyspnea.  He does report some fatigue with more prolonged exertion.  He had severe COVID-19 late in 2020.  He tells me that he did not fully recover for over 9 months after that.  He still thinks he has some residual symptoms relating to his pneumonia.  No history of syncope or presyncope.  No history of ventricular tachycardia.  Past Medical History:  Diagnosis Date  . Acute respiratory failure due to COVID-19 (HCC) 11/19/2019  . Allergy   . CHF (congestive heart failure) (HCC)   . Coronary artery disease   . Diabetes mellitus without complication (HCC)   . Hyperlipidemia   . Hypertension   . Myocardial infarction (HCC)   . Pneumonia due to COVID-19 virus 11/19/2019    Past Surgical History:  Procedure Laterality Date  . APPENDECTOMY    . colonscopy  2010   benign polyps  . CORONARY ARTERY BYPASS GRAFT  1995  .  CORONARY STENT INTERVENTION N/A 02/03/2017   Procedure: Coronary Stent Intervention;  Surgeon: Alwyn Peawayne D Callwood, MD;  Location: [facility] INVASIVE CV LAB;  Service: Cardiovascular;  Laterality: N/A;  . ESOPHAGOGASTRODUODENOSCOPY  2010   normal  . LEFT HEART CATH AND CORONARY ANGIOGRAPHY N/A 02/03/2017   Procedure: Left Heart Cath and Coronary Angiography;  Surgeon: Alwyn Peawayne D Callwood, MD;  Location: [facility] INVASIVE CV LAB;  Service: Cardiovascular;  Laterality: N/A;  . PERCUTANEOUS CORONARY STENT INTERVENTION (PCI-S)  2010, 2015   4 stents total    Current Medications: Current Meds  Medication Sig  . ACCU-CHEK AVIVA PLUS test strip TEST BLOOD SUGAR EVERY DAY AS DIRECTED  . albuterol (VENTOLIN HFA) 108 (90 Base) MCG/ACT inhaler Inhale 2 puffs into the lungs every 6 (six) hours as needed for wheezing or shortness of breath.  . allopurinol (ZYLOPRIM) 300 MG tablet Take 1 tablet (300 mg total) by mouth daily.  Marland Kitchen. ALPRAZolam (XANAX) 0.25 MG tablet Take 0.25 mg by mouth 3 (three) times daily as needed.  . Ascorbic Acid (VITAMIN C) 1000 MG tablet Take 1,000 mg by mouth daily.  Marland Kitchen. aspirin EC 81 MG tablet Take 81 mg by mouth daily.   Marland Kitchen. atorvastatin (LIPITOR) 80 MG tablet TAKE 1 TABLET(80 MG) BY MOUTH AT BEDTIME (Patient taking differently: Take 80 mg by mouth at bedtime. )  . budesonide-formoterol (SYMBICORT) 160-4.5 MCG/ACT inhaler Inhale 1 puff into the lungs 2 (two) times daily.  . carvedilol (COREG) 6.25 MG tablet  Take 6.25 mg by mouth 2 (two) times daily with a meal.  . cetirizine (ZYRTEC) 10 MG tablet Take 10 mg by mouth daily.  . clopidogrel (PLAVIX) 75 MG tablet Take 1 tablet (75 mg total) by mouth daily.  . fluticasone (FLONASE) 50 MCG/ACT nasal spray   . glipiZIDE (GLUCOTROL XL) 10 MG 24 hr tablet TAKE 1 TABLET(10 MG) BY MOUTH DAILY  . ibuprofen (ADVIL) 800 MG tablet Take 1 tablet (800 mg total) by mouth daily as needed.  . Insulin Pen Needle (PEN NEEDLES 31GX5/16") 31G X 8 MM MISC 1 each by Does not  apply route daily.  Marland Kitchen JARDIANCE 25 MG TABS tablet TAKE 1 TABLET BY MOUTH DAILY  . LANTUS SOLOSTAR 100 UNIT/ML Solostar Pen ADMINISTER 50 UNITS UNDER THE SKIN DAILY  . linagliptin (TRADJENTA) 5 MG TABS tablet Take 1 tablet (5 mg total) by mouth daily. (Patient taking differently: Take 2.5 mg by mouth in the morning and at bedtime. )  . losartan (COZAAR) 50 MG tablet Take 50 mg by mouth daily.  . nitroGLYCERIN (NITROSTAT) 0.4 MG SL tablet PLACE 1 TABLET UNDER THE TONGUE EVERY 5 MINUTES AS NEEDED FOR CHEST PAIN  . pantoprazole (PROTONIX) 40 MG tablet Take 1 tablet (40 mg total) by mouth 2 (two) times daily.  . potassium chloride SA (K-DUR,KLOR-CON) 20 MEQ tablet TAKE 2 TABLETS(40 MEQ) BY MOUTH DAILY (Patient taking differently: Take 40 mEq by mouth daily. )  . spironolactone (ALDACTONE) 25 MG tablet Take 25 mg by mouth daily.  Marland Kitchen torsemide (DEMADEX) 100 MG tablet Take by mouth 2 (two) times daily.   Marland Kitchen zinc gluconate 50 MG tablet Take 50 mg by mouth daily.     Allergies:   Entresto [sacubitril-valsartan] and Penicillins   Social History   Socioeconomic History  . Marital status: Legally Separated    Spouse name: Not on file  . Number of children: 8  . Years of education: Not on file  . Highest education level: 11th grade  Occupational History  . Occupation: Retired  Tobacco Use  . Smoking status: Former Smoker    Packs/day: 1.50    Years: 29.00    Pack years: 43.50    Types: Cigarettes    Quit date: 12/30/1994    Years since quitting: 25.8  . Smokeless tobacco: Never Used  . Tobacco comment: smoking cessation materials not required  Vaping Use  . Vaping Use: Never used  Substance and Sexual Activity  . Alcohol use: No    Alcohol/week: 0.0 standard drinks  . Drug use: No  . Sexual activity: Not Currently  Other Topics Concern  . Not on file  Social History Narrative  . Not on file   Social Determinants of Health   Financial Resource Strain: Medium Risk  . Difficulty of  Paying Living Expenses: Somewhat hard  Food Insecurity: No Food Insecurity  . Worried About Programme researcher, broadcasting/film/video in the Last Year: Never true  . Ran Out of Food in the Last Year: Never true  Transportation Needs: No Transportation Needs  . Lack of Transportation (Medical): No  . Lack of Transportation (Non-Medical): No  Physical Activity: Inactive  . Days of Exercise per Week: 0 days  . Minutes of Exercise per Session: 0 min  Stress: No Stress Concern Present  . Feeling of Stress : Only a little  Social Connections: Unknown  . Frequency of Communication with Friends and Family: Patient refused  . Frequency of Social Gatherings with Friends and Family: Patient  refused  . Attends Religious Services: Patient refused  . Active Member of Clubs or Organizations: Patient refused  . Attends Banker Meetings: Patient refused  . Marital Status: Married     Family History: The patient's family history includes Congestive Heart Failure in his mother; Diabetes in his mother; Heart attack in his mother; Heart disease in his father and mother; Lung cancer in his brother; Prostate cancer in his brother.  ROS:   Please see the history of present illness.    All other systems reviewed and are negative.  EKGs/Labs/Other Studies Reviewed:    The following studies were reviewed today: Outside records, outside echo  2020-05-19 outside echo (report only) LV function severely reduced, 20% Right ventricular function normal Mild to moderate mitral regurgitation Mild tricuspid regurgitation Moderate LV enlargement Compared to echo dated 2018-07-20, the LV systolic function had decreased  EKG:  The ekg ordered today demonstrates sinus rhythm, rightward axis, frequent PVCs, poor R wave progression  Recent Labs: 11/18/2019: B Natriuretic Peptide 564.0 11/19/2019: Magnesium 2.2 11/20/2019: Hemoglobin 14.6; Platelets 146 09/28/2020: ALT 25; BUN 31; Creatinine, Ser 1.38; Potassium 4.5;  Sodium 139  Recent Lipid Panel    Component Value Date/Time   CHOL 147 09/28/2020 0900   CHOL 102 10/11/2014 0414   TRIG 146 09/28/2020 0900   TRIG 194 10/11/2014 0414   HDL 36 (L) 09/28/2020 0900   HDL 24 (L) 10/11/2014 0414   CHOLHDL 4.1 09/28/2020 0900   VLDL 39 10/11/2014 0414   LDLCALC 85 09/28/2020 0900   LDLCALC 39 10/11/2014 0414    Physical Exam:    VS:  BP 110/70   Pulse 88   Ht 5\' 9"  (1.753 m)   Wt 196 lb (88.9 kg)   SpO2 98%   BMI 28.94 kg/m     Wt Readings from Last 3 Encounters:  11/08/20 196 lb (88.9 kg)  09/28/20 191 lb (86.6 kg)  04/19/20 175 lb 12.8 oz (79.7 kg)     GEN:  Well nourished, well developed in no acute distress HEENT: Normal NECK: No JVD; No carotid bruits LYMPHATICS: No lymphadenopathy CARDIAC: RRR, no murmurs, rubs, gallops RESPIRATORY:  Clear to auscultation without rales, wheezing or rhonchi  ABDOMEN: Soft, non-tender, non-distended MUSCULOSKELETAL:  No edema; No deformity  SKIN: Warm and dry NEUROLOGIC:  Alert and oriented x 3 PSYCHIATRIC:  Normal affect   ASSESSMENT:    1. Chronic systolic congestive heart failure (HCC)   2. Chronic systolic heart failure (HCC)   3. Ischemic cardiomyopathy   4. Coronary artery disease involving native coronary artery of native heart without angina pectoris    PLAN:    In order of problems listed above:  1. Chronic systolic heart failure secondary to ischemic cardiomyopathy On good medical therapy with carvedilol, Imdur, losartan, spironolactone, torsemide. Intolerant to [place] due to hypotension NYHA class II symptoms during today's visit. Discussed his risk for sudden cardiac death given his history of coronary artery disease and a depressed ejection fraction despite optimal medical therapy.  Discussed treatment options including defibrillator therapy.  He would like to proceed with scheduling for ICD implant.  He has family with a [place].  The patient has an  ischemic CM (EF 20%), NYHA Class II CHF, and CAD.  He is referred by Dr Research officer, political party for risk stratification of sudden death and consideration of ICD implantation.  At this time, he meets criteria for ICD implantation for primary prevention of sudden death.  I have had a thorough  discussion with the patient reviewing options.  The patient and their family (if available) have had opportunities to ask questions and have them answered. The patient and I have decided together through a shared decision making process to implant a primary prevention ICD at this time.   Risks, benefits, alternatives to ICD implantation were discussed in detail with the patient today. The patient understands that the risks include but are not limited to bleeding, infection, pneumothorax, perforation, tamponade, vascular damage, renal failure, MI, stroke, death, inappropriate shocks, and lead dislodgement and wishes to proceed.  We will therefore schedule device implantation at the next available time.  2.  Coronary artery disease post bypass grafting No ischemic symptoms today.  Continue medical therapy.    Medication Adjustments/Labs and Tests Ordered: Current medicines are reviewed at length with the patient today.  Concerns regarding medicines are outlined above.  Orders Placed This Encounter  Procedures  . EKG 12-Lead   No orders of the defined types were placed in this encounter.    Signed, Steffanie Dunn, MD, [facility]  11/08/2020 10:06 AM    Electrophysiology [facility]

## 2020-11-16 ENCOUNTER — Other Ambulatory Visit: Payer: Self-pay | Admitting: Internal Medicine

## 2020-11-29 ENCOUNTER — Other Ambulatory Visit: Payer: Self-pay | Admitting: Internal Medicine

## 2020-12-27 ENCOUNTER — Other Ambulatory Visit: Payer: Self-pay | Admitting: Internal Medicine

## 2020-12-27 DIAGNOSIS — M539 Dorsopathy, unspecified: Secondary | ICD-10-CM

## 2021-01-03 ENCOUNTER — Other Ambulatory Visit: Admission: RE | Admit: 2021-01-03 | Payer: Medicare Other | Source: Ambulatory Visit

## 2021-01-05 ENCOUNTER — Encounter (HOSPITAL_COMMUNITY): Admission: RE | Payer: Self-pay | Source: Home / Self Care

## 2021-01-05 ENCOUNTER — Ambulatory Visit (HOSPITAL_COMMUNITY): Admission: RE | Admit: 2021-01-05 | Payer: Medicare Other | Source: Home / Self Care | Admitting: Cardiology

## 2021-01-05 SURGERY — ICD IMPLANT

## 2021-01-16 ENCOUNTER — Ambulatory Visit: Payer: Medicare Other

## 2021-01-22 ENCOUNTER — Ambulatory Visit (INDEPENDENT_AMBULATORY_CARE_PROVIDER_SITE_OTHER): Payer: Medicare Other | Admitting: Vascular Surgery

## 2021-01-22 ENCOUNTER — Encounter (INDEPENDENT_AMBULATORY_CARE_PROVIDER_SITE_OTHER): Payer: Medicare Other

## 2021-02-07 ENCOUNTER — Other Ambulatory Visit: Payer: Self-pay | Admitting: Internal Medicine

## 2021-02-14 ENCOUNTER — Other Ambulatory Visit: Payer: Self-pay

## 2021-02-14 ENCOUNTER — Ambulatory Visit (INDEPENDENT_AMBULATORY_CARE_PROVIDER_SITE_OTHER): Payer: Medicare Other | Admitting: Internal Medicine

## 2021-02-14 ENCOUNTER — Encounter: Payer: Self-pay | Admitting: Internal Medicine

## 2021-02-14 VITALS — BP 100/62 | HR 67 | Temp 98.0°F | Ht 69.0 in | Wt 199.0 lb

## 2021-02-14 DIAGNOSIS — I5022 Chronic systolic (congestive) heart failure: Secondary | ICD-10-CM

## 2021-02-14 DIAGNOSIS — E1122 Type 2 diabetes mellitus with diabetic chronic kidney disease: Secondary | ICD-10-CM | POA: Diagnosis not present

## 2021-02-14 DIAGNOSIS — E118 Type 2 diabetes mellitus with unspecified complications: Secondary | ICD-10-CM

## 2021-02-14 DIAGNOSIS — I10 Essential (primary) hypertension: Secondary | ICD-10-CM

## 2021-02-14 DIAGNOSIS — I25119 Atherosclerotic heart disease of native coronary artery with unspecified angina pectoris: Secondary | ICD-10-CM

## 2021-02-14 DIAGNOSIS — N183 Chronic kidney disease, stage 3 unspecified: Secondary | ICD-10-CM | POA: Diagnosis not present

## 2021-02-14 NOTE — Progress Notes (Signed)
Date:  02/14/2021   Name:  Gary Gutierrez   DOB:  1951-03-20   MRN:  [id number]   Chief Complaint: Diabetes and Hypertension  Diabetes He presents for his follow-up diabetic visit. He has type 2 diabetes mellitus. His disease course has been stable. Pertinent negatives for hypoglycemia include no headaches or tremors. Pertinent negatives for diabetes include no chest pain, no fatigue, no polydipsia and no polyuria. Current diabetic treatment includes insulin injections (glipizide and jardiance). His weight is stable. He monitors blood glucose at home 1-2 x per day. His breakfast blood glucose is taken between 8-9 am. His breakfast blood glucose range is generally 130-140 mg/dl. An ACE inhibitor/angiotensin II receptor blocker is being taken.  Hypertension This is a chronic problem. The problem is controlled. Pertinent negatives include no chest pain, headaches, palpitations or shortness of breath. Hypertensive end-organ damage includes kidney disease, CAD/MI and heart failure. Identifiable causes of hypertension include chronic renal disease.    Lab Results  Component Value Date   CREATININE 1.38 (H) 09/28/2020   BUN 31 (H) 09/28/2020   NA 139 09/28/2020   K 4.5 09/28/2020   CL 97 09/28/2020   CO2 24 09/28/2020   Lab Results  Component Value Date   CHOL 147 09/28/2020   HDL 36 (L) 09/28/2020   LDLCALC 85 09/28/2020   TRIG 146 09/28/2020   CHOLHDL 4.1 09/28/2020   Lab Results  Component Value Date   TSH 2.85 10/11/2014   Lab Results  Component Value Date   HGBA1C 8.1 (H) 09/28/2020   Lab Results  Component Value Date   WBC 8.5 11/20/2019   HGB 14.6 11/20/2019   HCT 44.0 11/20/2019   MCV 92.6 11/20/2019   PLT 146 (L) 11/20/2019   Lab Results  Component Value Date   ALT 25 09/28/2020   AST 23 09/28/2020   ALKPHOS 185 (H) 09/28/2020   BILITOT 0.8 09/28/2020     Review of Systems  Constitutional: Negative for appetite change, fatigue and unexpected weight  change.  Eyes: Negative for visual disturbance.  Respiratory: Negative for cough, shortness of breath and wheezing.   Cardiovascular: Negative for chest pain, palpitations and leg swelling.  Gastrointestinal: Negative for abdominal pain and blood in stool.  Endocrine: Negative for polydipsia and polyuria.  Genitourinary: Negative for dysuria and hematuria.  Skin: Negative for color change and rash.  Neurological: Negative for tremors, numbness and headaches.  Psychiatric/Behavioral: Negative for dysphoric mood.    Patient Active Problem List   Diagnosis Date Noted  . CKD stage 3 secondary to diabetes (HCC) 07/06/2020  . Thoracic aortic atherosclerosis (HCC) 07/05/2020  . Acute on chronic combined systolic and diastolic CHF (congestive heart failure) (HCC) 11/17/2019  . Type II diabetes mellitus with complication (HCC) 10/27/2018  . Chronic obstructive pulmonary disease (HCC) 02/11/2018  . CHF (congestive heart failure) (HCC) 10/29/2017  . Polyneuropathy due to type 2 diabetes mellitus (HCC) 07/01/2017  . PAD (peripheral artery disease) (HCC) 06/30/2017  . Tobacco use disorder, mild, in sustained remission, abuse 04/10/2017  . Hx of colonic polyps 04/10/2016  . Hearing loss of both ears 01/03/2016  . Hyperlipidemia associated with type 2 diabetes mellitus (HCC) 06/09/2015  . Essential hypertension 06/09/2015  . Multilevel degenerative disc disease 06/09/2015  . Coronary artery disease involving native coronary artery of native heart with angina pectoris (HCC) 05/09/2014  . Coronary artery disease involving autologous vein bypass graft 05/09/2014    Allergies  Allergen Reactions  . Entresto [Sacubitril-Valsartan] Other (  See Comments)    Low blood pressure  . Penicillins Other (See Comments)    Other reaction(s): Unknown, pt does not know reactions    Past Surgical History:  Procedure Laterality Date  . APPENDECTOMY    . colonscopy  2010   benign polyps  . CORONARY ARTERY  BYPASS GRAFT  1995  . CORONARY STENT INTERVENTION N/A 02/03/2017   Procedure: Coronary Stent Intervention;  Surgeon: Alwyn Pea, MD;  Location: [facility] INVASIVE CV LAB;  Service: Cardiovascular;  Laterality: N/A;  . ESOPHAGOGASTRODUODENOSCOPY  2010   normal  . LEFT HEART CATH AND CORONARY ANGIOGRAPHY N/A 02/03/2017   Procedure: Left Heart Cath and Coronary Angiography;  Surgeon: Alwyn Pea, MD;  Location: [facility] INVASIVE CV LAB;  Service: Cardiovascular;  Laterality: N/A;  . PERCUTANEOUS CORONARY STENT INTERVENTION (PCI-S)  2010, 2015   4 stents total    Social History   Tobacco Use  . Smoking status: Former Smoker    Packs/day: 1.50    Years: 29.00    Pack years: 43.50    Types: Cigarettes    Quit date: 12/30/1994    Years since quitting: 26.1  . Smokeless tobacco: Never Used  . Tobacco comment: smoking cessation materials not required  Vaping Use  . Vaping Use: Never used  Substance Use Topics  . Alcohol use: No    Alcohol/week: 0.0 standard drinks  . Drug use: No     Medication list has been reviewed and updated.  Current Meds  Medication Sig  . ACCU-CHEK AVIVA PLUS test strip TEST BLOOD SUGAR EVERY DAY AS DIRECTED  . albuterol (VENTOLIN HFA) 108 (90 Base) MCG/ACT inhaler Inhale 2 puffs into the lungs every 6 (six) hours as needed for wheezing or shortness of breath.  . allopurinol (ZYLOPRIM) 300 MG tablet TAKE 1 TABLET(300 MG) BY MOUTH DAILY  . Ascorbic Acid (VITAMIN C) 1000 MG tablet Take 1,000 mg by mouth daily.  Marland Kitchen aspirin EC 81 MG tablet Take 81 mg by mouth daily.   Marland Kitchen atorvastatin (LIPITOR) 80 MG tablet TAKE 1 TABLET(80 MG) BY MOUTH AT BEDTIME (Patient taking differently: Take 80 mg by mouth at bedtime.)  . budesonide-formoterol (SYMBICORT) 160-4.5 MCG/ACT inhaler Inhale 1 puff into the lungs 2 (two) times daily. (Patient taking differently: Inhale 1 puff into the lungs as needed.)  . carvedilol (COREG) 6.25 MG tablet Take 6.25 mg by mouth 2 (two) times daily with  a meal.  . Cholecalciferol (VITAMIN D3 PO) Take by mouth.  . clopidogrel (PLAVIX) 75 MG tablet TAKE 1 TABLET(75 MG) BY MOUTH DAILY  . glipiZIDE (GLUCOTROL XL) 10 MG 24 hr tablet TAKE 1 TABLET(10 MG) BY MOUTH DAILY  . ibuprofen (ADVIL) 800 MG tablet TAKE 1 TABLET(800 MG) BY MOUTH DAILY AS NEEDED  . Insulin Pen Needle (PEN NEEDLES 31GX5/16") 31G X 8 MM MISC 1 each by Does not apply route daily.  . isosorbide mononitrate (IMDUR) 30 MG 24 hr tablet Take 1 tablet (30 mg total) by mouth daily. (Patient taking differently: Take 30 mg by mouth in the morning and at bedtime. 60 MG a day)  . JARDIANCE 25 MG TABS tablet TAKE 1 TABLET BY MOUTH DAILY  . LANTUS SOLOSTAR 100 UNIT/ML Solostar Pen ADMINISTER 50 UNITS UNDER THE SKIN DAILY  . linagliptin (TRADJENTA) 5 MG TABS tablet Take 1 tablet (5 mg total) by mouth daily. (Patient taking differently: Take 2.5 mg by mouth in the morning and at bedtime.)  . losartan (COZAAR) 50 MG tablet Take 50  mg by mouth daily.  . nitroGLYCERIN (NITROSTAT) 0.4 MG SL tablet PLACE 1 TABLET UNDER THE TONGUE EVERY 5 MINUTES AS NEEDED FOR CHEST PAIN  . pantoprazole (PROTONIX) 40 MG tablet Take 1 tablet (40 mg total) by mouth 2 (two) times daily.  . potassium chloride SA (K-DUR,KLOR-CON) 20 MEQ tablet TAKE 2 TABLETS(40 MEQ) BY MOUTH DAILY (Patient taking differently: Take 40 mEq by mouth daily.)  . spironolactone (ALDACTONE) 25 MG tablet Take 25 mg by mouth daily.  Marland Kitchen torsemide (DEMADEX) 100 MG tablet Take by mouth 2 (two) times daily.   Marland Kitchen zinc gluconate 50 MG tablet Take 50 mg by mouth daily.    PHQ 2/9 Scores 02/14/2021 04/19/2020 03/01/2020 01/18/2020  PHQ - 2 Score 1 0 0 0  PHQ- 9 Score 4 - 1 -    GAD 7 : Generalized Anxiety Score 02/14/2021  Nervous, Anxious, on Edge 0  Control/stop worrying 1  Worry too much - different things 1  Trouble relaxing 0  Restless 0  Easily annoyed or irritable 0  Afraid - awful might happen 0  Total GAD 7 Score 2    BP Readings from Last 3  Encounters:  02/14/21 100/62  11/08/20 110/70  09/28/20 124/70    Physical Exam Vitals and nursing note reviewed.  Constitutional:      General: He is not in acute distress.    Appearance: Normal appearance. He is well-developed.  HENT:     Head: Normocephalic and atraumatic.  Neck:     Vascular: No carotid bruit.  Cardiovascular:     Rate and Rhythm: Normal rate and regular rhythm.     Pulses: Normal pulses.     Heart sounds: No murmur heard.   Pulmonary:     Effort: Pulmonary effort is normal. No respiratory distress.     Breath sounds: No wheezing or rhonchi.  Musculoskeletal:        General: Normal range of motion.     Cervical back: Normal range of motion.     Right lower leg: No edema.     Left lower leg: No edema.  Lymphadenopathy:     Cervical: No cervical adenopathy.  Skin:    General: Skin is warm and dry.     Findings: No rash.  Neurological:     General: No focal deficit present.     Mental Status: He is alert and oriented to person, place, and time.  Psychiatric:        Mood and Affect: Mood normal.        Behavior: Behavior normal.     Wt Readings from Last 3 Encounters:  02/14/21 199 lb (90.3 kg)  11/08/20 196 lb (88.9 kg)  09/28/20 191 lb (86.6 kg)    BP 100/62   Pulse 67   Temp 98 F (36.7 C) (Oral)   Ht 5\' 9"  (1.753 m)   Wt 199 lb (90.3 kg)   SpO2 97%   BMI 29.39 kg/m   Assessment and Plan: 1. Type II diabetes mellitus with complication (HCC) Clinically stable by exam and report without s/s of hypoglycemia. DM complicated by CAD,lipids, HTN. Tolerating medications well without side effects or other concerns. - Comprehensive metabolic panel - Hemoglobin A1c  2. CKD stage 3 secondary to diabetes [facility]) Check labs and advise - Comprehensive metabolic panel  3. Chronic systolic heart failure (HCC) Stable on current medication; appears euvolemic with normal pulmonary exam Seeing [facility] next week  4. Atherosclerosis of native  coronary artery of  native heart with angina pectoris (HCC) On statin therapy  5. Essential hypertension Clinically stable exam with well controlled BP . Tolerating medications without side effects at this time. Pt to continue current regimen and low sodium diet; benefits of regular exercise as able discussed.   Partially dictated using Animal nutritionistDragon software. Any errors are unintentional.  Bari EdwardLaura Tyeler Goedken, MD [facility]  02/14/2021

## 2021-02-15 LAB — COMPREHENSIVE METABOLIC PANEL
ALT: 24 IU/L (ref 0–44)
AST: 19 IU/L (ref 0–40)
Albumin/Globulin Ratio: 1.3 (ref 1.2–2.2)
Albumin: 4.1 g/dL (ref 3.8–4.8)
Alkaline Phosphatase: 185 IU/L — ABNORMAL HIGH (ref 44–121)
BUN/Creatinine Ratio: 23 (ref 10–24)
BUN: 35 mg/dL — ABNORMAL HIGH (ref 8–27)
Bilirubin Total: 0.6 mg/dL (ref 0.0–1.2)
CO2: 23 mmol/L (ref 20–29)
Calcium: 9.1 mg/dL (ref 8.6–10.2)
Chloride: 94 mmol/L — ABNORMAL LOW (ref 96–106)
Creatinine, Ser: 1.49 mg/dL — ABNORMAL HIGH (ref 0.76–1.27)
GFR calc Af Amer: 54 mL/min/{1.73_m2} — ABNORMAL LOW (ref >59)
GFR calc non Af Amer: 47 mL/min/{1.73_m2} — ABNORMAL LOW (ref >59)
Globulin, Total: 3.2 g/dL (ref 1.5–4.5)
Glucose: 213 mg/dL — ABNORMAL HIGH (ref 65–99)
Potassium: 4 mmol/L (ref 3.5–5.2)
Sodium: 136 mmol/L (ref 134–144)
Total Protein: 7.3 g/dL (ref 6.0–8.5)

## 2021-02-15 LAB — HEMOGLOBIN A1C
Est. average glucose Bld gHb Est-mCnc: 186 mg/dL
Hgb A1c MFr Bld: 8.1 % — ABNORMAL HIGH (ref 4.8–5.6)

## 2021-02-19 DIAGNOSIS — E7849 Other hyperlipidemia: Secondary | ICD-10-CM | POA: Diagnosis not present

## 2021-02-19 DIAGNOSIS — R600 Localized edema: Secondary | ICD-10-CM | POA: Diagnosis not present

## 2021-02-19 DIAGNOSIS — R001 Bradycardia, unspecified: Secondary | ICD-10-CM | POA: Diagnosis not present

## 2021-02-19 DIAGNOSIS — I5022 Chronic systolic (congestive) heart failure: Secondary | ICD-10-CM | POA: Diagnosis not present

## 2021-02-19 DIAGNOSIS — I255 Ischemic cardiomyopathy: Secondary | ICD-10-CM | POA: Diagnosis not present

## 2021-02-19 DIAGNOSIS — J449 Chronic obstructive pulmonary disease, unspecified: Secondary | ICD-10-CM | POA: Diagnosis not present

## 2021-02-19 DIAGNOSIS — I4891 Unspecified atrial fibrillation: Secondary | ICD-10-CM | POA: Diagnosis not present

## 2021-02-19 DIAGNOSIS — K219 Gastro-esophageal reflux disease without esophagitis: Secondary | ICD-10-CM | POA: Diagnosis not present

## 2021-02-19 DIAGNOSIS — I25810 Atherosclerosis of coronary artery bypass graft(s) without angina pectoris: Secondary | ICD-10-CM | POA: Diagnosis not present

## 2021-02-19 DIAGNOSIS — R000 Tachycardia, unspecified: Secondary | ICD-10-CM | POA: Diagnosis not present

## 2021-02-19 DIAGNOSIS — I214 Non-ST elevation (NSTEMI) myocardial infarction: Secondary | ICD-10-CM | POA: Diagnosis not present

## 2021-02-19 DIAGNOSIS — Z955 Presence of coronary angioplasty implant and graft: Secondary | ICD-10-CM | POA: Diagnosis not present

## 2021-03-07 ENCOUNTER — Other Ambulatory Visit: Payer: Self-pay | Admitting: Internal Medicine

## 2021-03-25 ENCOUNTER — Other Ambulatory Visit: Payer: Self-pay | Admitting: Internal Medicine

## 2021-03-25 NOTE — Telephone Encounter (Signed)
Future visit 06/19/21. Approved per protocol. Requested Prescriptions  Pending Prescriptions Disp Refills  . glipiZIDE (GLUCOTROL XL) 10 MG 24 hr tablet [Pharmacy Med Name: GLIPIZIDE ER 10MG  TABLETS] 90 tablet 0    Sig: TAKE 1 TABLET(10 MG) BY MOUTH DAILY     Endocrinology:  Diabetes - Sulfonylureas Failed - 03/25/2021  3:42 PM      Failed - HBA1C is between 0 and 7.9 and within 180 days    Hemoglobin A1C  Date Value Ref Range Status  10/11/2014 6.9 (H) 4.2 - 6.3 % Final    Comment:    The American Diabetes Association recommends that a primary goal of therapy should be <7% and that physicians should reevaluate the treatment regimen in patients with HbA1c values consistently >8%.    Hgb A1c MFr Bld  Date Value Ref Range Status  02/14/2021 8.1 (H) 4.8 - 5.6 % Final    Comment:             Prediabetes: 5.7 - 6.4          Diabetes: >6.4          Glycemic control for adults with diabetes: <7.0          Passed - Valid encounter within last 6 months    Recent Outpatient Visits          1 month ago Type II diabetes mellitus with complication [facility])   [facility], MD   5 months ago Type II diabetes mellitus with complication [facility])   [facility], MD   1 year ago Type II diabetes mellitus with complication [facility])   [facility], MD   1 year ago Cellulitis of toe of left foot   [facility], MD   1 year ago Chronic systolic congestive heart failure [facility])   [facility], MD      Future Appointments            In 2 months [name] [name], MD [facility], [facility]

## 2021-03-28 ENCOUNTER — Other Ambulatory Visit: Payer: Self-pay | Admitting: Internal Medicine

## 2021-03-28 DIAGNOSIS — E118 Type 2 diabetes mellitus with unspecified complications: Secondary | ICD-10-CM

## 2021-04-22 ENCOUNTER — Other Ambulatory Visit: Payer: Self-pay | Admitting: Internal Medicine

## 2021-04-22 DIAGNOSIS — I5022 Chronic systolic (congestive) heart failure: Secondary | ICD-10-CM

## 2021-04-22 NOTE — Telephone Encounter (Signed)
Requested Prescriptions  Pending Prescriptions Disp Refills  . nitroGLYCERIN (NITROSTAT) 0.4 MG SL tablet [Pharmacy Med Name: NITROGLYCERIN 0.4MG  SUB TAB 25S] 25 tablet 1    Sig: PLACE 1 TABLET UNDER THE TONGUE EVERY 5 MINUTES AS NEEDED FOR CHEST PAIN     Cardiovascular:  Nitrates Passed - 04/22/2021  1:15 PM      Passed - Last BP in normal range    BP Readings from Last 1 Encounters:  02/14/21 100/62         Passed - Last Heart Rate in normal range    Pulse Readings from Last 1 Encounters:  02/14/21 67         Passed - Valid encounter within last 12 months    Recent Outpatient Visits          2 months ago Type II diabetes mellitus with complication [facility])   [facility] [name], MD   6 months ago Type II diabetes mellitus with complication [facility])   [facility] [name], MD   1 year ago Type II diabetes mellitus with complication [facility])   [facility] [name], MD   1 year ago Cellulitis of toe of left foot   [facility] [name], MD   1 year ago Chronic systolic congestive heart failure [facility])   [facility] [name], MD      Future Appointments            In 1 month [name], Nyoka Cowden, MD [facility], [facility]

## 2021-04-23 ENCOUNTER — Ambulatory Visit (INDEPENDENT_AMBULATORY_CARE_PROVIDER_SITE_OTHER): Payer: Medicare Other | Admitting: Internal Medicine

## 2021-04-23 ENCOUNTER — Telehealth: Payer: Self-pay | Admitting: *Deleted

## 2021-04-23 ENCOUNTER — Ambulatory Visit (INDEPENDENT_AMBULATORY_CARE_PROVIDER_SITE_OTHER): Payer: Medicare Other

## 2021-04-23 ENCOUNTER — Encounter: Payer: Self-pay | Admitting: Internal Medicine

## 2021-04-23 ENCOUNTER — Other Ambulatory Visit: Payer: Self-pay

## 2021-04-23 VITALS — BP 100/64 | HR 90 | Temp 97.7°F | Ht 69.0 in | Wt 197.2 lb

## 2021-04-23 VITALS — BP 100/64 | HR 90 | Temp 97.7°F | Resp 16 | Ht 69.0 in | Wt 197.2 lb

## 2021-04-23 DIAGNOSIS — Z1211 Encounter for screening for malignant neoplasm of colon: Secondary | ICD-10-CM | POA: Diagnosis not present

## 2021-04-23 DIAGNOSIS — E118 Type 2 diabetes mellitus with unspecified complications: Secondary | ICD-10-CM | POA: Diagnosis not present

## 2021-04-23 DIAGNOSIS — I25119 Atherosclerotic heart disease of native coronary artery with unspecified angina pectoris: Secondary | ICD-10-CM | POA: Diagnosis not present

## 2021-04-23 DIAGNOSIS — E1122 Type 2 diabetes mellitus with diabetic chronic kidney disease: Secondary | ICD-10-CM

## 2021-04-23 DIAGNOSIS — Z23 Encounter for immunization: Secondary | ICD-10-CM

## 2021-04-23 DIAGNOSIS — S60511A Abrasion of right hand, initial encounter: Secondary | ICD-10-CM

## 2021-04-23 DIAGNOSIS — J449 Chronic obstructive pulmonary disease, unspecified: Secondary | ICD-10-CM

## 2021-04-23 DIAGNOSIS — I5022 Chronic systolic (congestive) heart failure: Secondary | ICD-10-CM

## 2021-04-23 DIAGNOSIS — N183 Type 2 diabetes mellitus with diabetic chronic kidney disease: Secondary | ICD-10-CM

## 2021-04-23 DIAGNOSIS — L03113 Cellulitis of right upper limb: Secondary | ICD-10-CM | POA: Diagnosis not present

## 2021-04-23 DIAGNOSIS — Z0000 Encounter for general adult medical examination without abnormal findings: Secondary | ICD-10-CM | POA: Diagnosis not present

## 2021-04-23 MED ORDER — SULFAMETHOXAZOLE-TRIMETHOPRIM 800-160 MG PO TABS: 1.0000 | ORAL_TABLET | Freq: Two times a day (BID) | ORAL | 0 refills | Status: AC

## 2021-04-23 NOTE — Patient Instructions (Signed)
Clean the wound with warm water and mild soap only - no alcohol or peroxide. Cover with triple antibiotic ointment and loose bandaid.

## 2021-04-23 NOTE — Progress Notes (Signed)
Date:  04/23/2021   Name:  Gary Gutierrez   DOB:  April 10, 1951   MRN:  [id number]   Chief Complaint: Wound Check (Patient said he hurt  his hand a month ago in his garage at home. The wound swelled up "like a boil" so he poked it himself to drain out the fluid. He said its not healing because he continues to hit it and bust it open. Here to check wound and see if he needs abx.) Injured the back of his hand at home on 03/24/21.  He pinched it between two pieces of metal while trying to reach a tool.  HPI  Lab Results  Component Value Date   CREATININE 1.49 (H) 02/14/2021   BUN 35 (H) 02/14/2021   NA 136 02/14/2021   K 4.0 02/14/2021   CL 94 (L) 02/14/2021   CO2 23 02/14/2021   Lab Results  Component Value Date   CHOL 147 09/28/2020   HDL 36 (L) 09/28/2020   LDLCALC 85 09/28/2020   TRIG 146 09/28/2020   CHOLHDL 4.1 09/28/2020   Lab Results  Component Value Date   TSH 2.85 10/11/2014   Lab Results  Component Value Date   HGBA1C 8.1 (H) 02/14/2021   Lab Results  Component Value Date   WBC 8.5 11/20/2019   HGB 14.6 11/20/2019   HCT 44.0 11/20/2019   MCV 92.6 11/20/2019   PLT 146 (L) 11/20/2019   Lab Results  Component Value Date   ALT 24 02/14/2021   AST 19 02/14/2021   ALKPHOS 185 (H) 02/14/2021   BILITOT 0.6 02/14/2021     Review of Systems  Constitutional: Negative for chills, fatigue and fever.  Respiratory: Negative for chest tightness and shortness of breath.   Cardiovascular: Negative for chest pain.  Musculoskeletal: Negative for myalgias.  Skin: Positive for wound.    Patient Active Problem List   Diagnosis Date Noted  . CKD stage 3 secondary to diabetes (HCC) 07/06/2020  . Thoracic aortic atherosclerosis (HCC) 07/05/2020  . Acute on chronic combined systolic and diastolic CHF (congestive heart failure) (HCC) 11/17/2019  . Type II diabetes mellitus with complication (HCC) 10/27/2018  . Chronic obstructive pulmonary disease (HCC) 02/11/2018  .  CHF (congestive heart failure) (HCC) 10/29/2017  . Polyneuropathy due to type 2 diabetes mellitus (HCC) 07/01/2017  . PAD (peripheral artery disease) (HCC) 06/30/2017  . Tobacco use disorder, mild, in sustained remission, abuse 04/10/2017  . Hx of colonic polyps 04/10/2016  . Hearing loss of both ears 01/03/2016  . Hyperlipidemia associated with type 2 diabetes mellitus (HCC) 06/09/2015  . Essential hypertension 06/09/2015  . Multilevel degenerative disc disease 06/09/2015  . Coronary artery disease involving native coronary artery of native heart with angina pectoris (HCC) 05/09/2014  . Coronary artery disease involving autologous vein bypass graft 05/09/2014    Allergies  Allergen Reactions  . Entresto [Sacubitril-Valsartan] Other (See Comments)    Low blood pressure  . Penicillins Other (See Comments)    Other reaction(s): Unknown, pt does not know reactions    Past Surgical History:  Procedure Laterality Date  . APPENDECTOMY    . colonscopy  2010   benign polyps  . CORONARY ARTERY BYPASS GRAFT  1995  . CORONARY STENT INTERVENTION N/A 02/03/2017   Procedure: Coronary Stent Intervention;  Surgeon: Alwyn Pea, MD;  Location: [facility] INVASIVE CV LAB;  Service: Cardiovascular;  Laterality: N/A;  . ESOPHAGOGASTRODUODENOSCOPY  2010   normal  . LEFT HEART CATH AND CORONARY  ANGIOGRAPHY N/A 02/03/2017   Procedure: Left Heart Cath and Coronary Angiography;  Surgeon: Alwyn Pea, MD;  Location: [facility] INVASIVE CV LAB;  Service: Cardiovascular;  Laterality: N/A;  . PERCUTANEOUS CORONARY STENT INTERVENTION (PCI-S)  2010, 2015   4 stents total    Social History   Tobacco Use  . Smoking status: Former Smoker    Packs/day: 1.50    Years: 29.00    Pack years: 43.50    Types: Cigarettes    Quit date: 12/30/1994    Years since quitting: 26.3  . Smokeless tobacco: Never Used  . Tobacco comment: smoking cessation materials not required  Vaping Use  . Vaping Use: Never used   Substance Use Topics  . Alcohol use: No    Alcohol/week: 0.0 standard drinks  . Drug use: No     Medication list has been reviewed and updated.  Current Meds  Medication Sig  . ACCU-CHEK AVIVA PLUS test strip TEST BLOOD SUGAR EVERY DAY AS DIRECTED  . albuterol (VENTOLIN HFA) 108 (90 Base) MCG/ACT inhaler Inhale 2 puffs into the lungs every 6 (six) hours as needed for wheezing or shortness of breath.  . allopurinol (ZYLOPRIM) 300 MG tablet TAKE 1 TABLET(300 MG) BY MOUTH DAILY  . Ascorbic Acid (VITAMIN C) 1000 MG tablet Take 1,000 mg by mouth daily.  Marland Kitchen aspirin EC 81 MG tablet Take 81 mg by mouth daily.   Marland Kitchen atorvastatin (LIPITOR) 80 MG tablet TAKE 1 TABLET(80 MG) BY MOUTH AT BEDTIME (Patient taking differently: Take 80 mg by mouth at bedtime.)  . budesonide-formoterol (SYMBICORT) 160-4.5 MCG/ACT inhaler Inhale 1 puff into the lungs 2 (two) times daily. (Patient taking differently: Inhale 1 puff into the lungs as needed.)  . carvedilol (COREG) 6.25 MG tablet Take 6.25 mg by mouth 2 (two) times daily with a meal.  . Cholecalciferol (VITAMIN D3 PO) Take by mouth.  . clopidogrel (PLAVIX) 75 MG tablet TAKE 1 TABLET(75 MG) BY MOUTH DAILY  . glipiZIDE (GLUCOTROL XL) 10 MG 24 hr tablet TAKE 1 TABLET(10 MG) BY MOUTH DAILY  . ibuprofen (ADVIL) 800 MG tablet TAKE 1 TABLET(800 MG) BY MOUTH DAILY AS NEEDED  . Insulin Pen Needle (PEN NEEDLES 31GX5/16") 31G X 8 MM MISC 1 each by Does not apply route daily.  Marland Kitchen JARDIANCE 25 MG TABS tablet TAKE 1 TABLET BY MOUTH DAILY  . LANTUS SOLOSTAR 100 UNIT/ML Solostar Pen ADMINISTER 50 UNITS UNDER THE SKIN DAILY  . linagliptin (TRADJENTA) 5 MG TABS tablet Take 0.5 tablets (2.5 mg total) by mouth in the morning and at bedtime.  Marland Kitchen losartan (COZAAR) 50 MG tablet Take 50 mg by mouth daily.  . nitroGLYCERIN (NITROSTAT) 0.4 MG SL tablet PLACE 1 TABLET UNDER THE TONGUE EVERY 5 MINUTES AS NEEDED FOR CHEST PAIN  . pantoprazole (PROTONIX) 40 MG tablet Take 1 tablet (40 mg  total) by mouth 2 (two) times daily.  . potassium chloride SA (K-DUR,KLOR-CON) 20 MEQ tablet TAKE 2 TABLETS(40 MEQ) BY MOUTH DAILY (Patient taking differently: Take 40 mEq by mouth daily.)  . spironolactone (ALDACTONE) 25 MG tablet Take 25 mg by mouth daily.  Marland Kitchen torsemide (DEMADEX) 100 MG tablet Take by mouth 2 (two) times daily.   Marland Kitchen zinc gluconate 50 MG tablet Take 50 mg by mouth daily.    PHQ 2/9 Scores 04/23/2021 02/14/2021 04/19/2020 03/01/2020  PHQ - 2 Score 0 1 0 0  PHQ- 9 Score - 4 - 1    GAD 7 : Generalized Anxiety Score 02/14/2021  Nervous, Anxious, on Edge 0  Control/stop worrying 1  Worry too much - different things 1  Trouble relaxing 0  Restless 0  Easily annoyed or irritable 0  Afraid - awful might happen 0  Total GAD 7 Score 2    BP Readings from Last 3 Encounters:  04/23/21 100/64  04/23/21 100/64  02/14/21 100/62    Physical Exam Vitals and nursing note reviewed.  Constitutional:      General: He is not in acute distress.    Appearance: He is well-developed.  HENT:     Head: Normocephalic and atraumatic.  Cardiovascular:     Rate and Rhythm: Normal rate and regular rhythm.  Pulmonary:     Effort: Pulmonary effort is normal. No respiratory distress.     Breath sounds: Normal breath sounds.  Skin:    General: Skin is warm and dry.     Findings: Wound present. No rash.     Comments: Abrasion with surrounding erythema on dorsum of right hand 1 cm open area with 3 cm redness No bleeding, purulence or fluctuance   Neurological:     Mental Status: He is alert and oriented to person, place, and time.  Psychiatric:        Mood and Affect: Mood normal.        Behavior: Behavior normal.     Wt Readings from Last 3 Encounters:  04/23/21 197 lb 3.2 oz (89.4 kg)  04/23/21 197 lb 3.2 oz (89.4 kg)  02/14/21 199 lb (90.3 kg)    BP 100/64   Pulse 90   Temp 97.7 F (36.5 C) (Oral)   Ht 5\' 9"  (1.753 m)   Wt 197 lb 3.2 oz (89.4 kg)   SpO2 98%   BMI 29.12 kg/m    Assessment and Plan: 1. Cellulitis of right upper extremity Local care with warm water cleansing, TAO and light dressing - sulfamethoxazole-trimethoprim (BACTRIM DS) 800-160 MG tablet; Take 1 tablet by mouth 2 (two) times daily for 10 days.  Dispense: 20 tablet; Refill: 0  2. Abrasion of right hand, initial encounter Last tetanus done 8 years ago - Tdap vaccine greater than or equal to 7yo IM   Partially dictated using . Any errors are unintentional.  Animal nutritionist, MD [facility]  04/23/2021

## 2021-04-23 NOTE — Addendum Note (Signed)
Addended by: [name] D on: 04/23/2021 11:21 AM   Modules accepted: Orders

## 2021-04-23 NOTE — Progress Notes (Addendum)
Subjective:   Gary Gutierrez is a 70 y.o. male who presents for Medicare Annual/Subsequent preventive examination.  Review of Systems     Cardiac Risk Factors include: advanced age (>21men, >35 women);diabetes mellitus;dyslipidemia;male gender;hypertension     Objective:    Today's Vitals   04/23/21 0850  BP: 100/64  Pulse: 90  Resp: 16  Temp: 97.7 F (36.5 C)  TempSrc: Oral  SpO2: 98%  Weight: 197 lb 3.2 oz (89.4 kg)  Height: 5\' 9"  (1.753 m)  PainSc: 4    Body mass index is 29.12 kg/m.  Advanced Directives 04/23/2021 04/19/2020 11/19/2019 11/17/2019 06/05/2019 06/05/2019 07/22/2018  Does Patient Have a Medical Advance Directive? No No No No No No No  Would patient like information on creating a medical advance directive? No - Patient declined Yes (MAU/Ambulatory/Procedural Areas - Information given) No - Patient declined - No - Patient declined No - Patient declined Yes (MAU/Ambulatory/Procedural Areas - Information given)    Current Medications (verified) Outpatient Encounter Medications as of 04/23/2021  Medication Sig  . ACCU-CHEK AVIVA PLUS test strip TEST BLOOD SUGAR EVERY DAY AS DIRECTED  . albuterol (VENTOLIN HFA) 108 (90 Base) MCG/ACT inhaler Inhale 2 puffs into the lungs every 6 (six) hours as needed for wheezing or shortness of breath.  . allopurinol (ZYLOPRIM) 300 MG tablet TAKE 1 TABLET(300 MG) BY MOUTH DAILY  . Ascorbic Acid (VITAMIN C) 1000 MG tablet Take 1,000 mg by mouth daily.  04/25/2021 aspirin EC 81 MG tablet Take 81 mg by mouth daily.   Marland Kitchen atorvastatin (LIPITOR) 80 MG tablet TAKE 1 TABLET(80 MG) BY MOUTH AT BEDTIME (Patient taking differently: Take 80 mg by mouth at bedtime.)  . budesonide-formoterol (SYMBICORT) 160-4.5 MCG/ACT inhaler Inhale 1 puff into the lungs 2 (two) times daily. (Patient taking differently: Inhale 1 puff into the lungs as needed.)  . carvedilol (COREG) 6.25 MG tablet Take 6.25 mg by mouth 2 (two) times daily with a meal.  . Cholecalciferol  (VITAMIN D3 PO) Take by mouth.  . clopidogrel (PLAVIX) 75 MG tablet TAKE 1 TABLET(75 MG) BY MOUTH DAILY  . glipiZIDE (GLUCOTROL XL) 10 MG 24 hr tablet TAKE 1 TABLET(10 MG) BY MOUTH DAILY  . ibuprofen (ADVIL) 800 MG tablet TAKE 1 TABLET(800 MG) BY MOUTH DAILY AS NEEDED  . Insulin Pen Needle (PEN NEEDLES 31GX5/16") 31G X 8 MM MISC 1 each by Does not apply route daily.  Marland Kitchen JARDIANCE 25 MG TABS tablet TAKE 1 TABLET BY MOUTH DAILY  . LANTUS SOLOSTAR 100 UNIT/ML Solostar Pen ADMINISTER 50 UNITS UNDER THE SKIN DAILY  . linagliptin (TRADJENTA) 5 MG TABS tablet Take 0.5 tablets (2.5 mg total) by mouth in the morning and at bedtime.  Marland Kitchen losartan (COZAAR) 50 MG tablet Take 50 mg by mouth daily.  . nitroGLYCERIN (NITROSTAT) 0.4 MG SL tablet PLACE 1 TABLET UNDER THE TONGUE EVERY 5 MINUTES AS NEEDED FOR CHEST PAIN  . pantoprazole (PROTONIX) 40 MG tablet Take 1 tablet (40 mg total) by mouth 2 (two) times daily.  . potassium chloride SA (K-DUR,KLOR-CON) 20 MEQ tablet TAKE 2 TABLETS(40 MEQ) BY MOUTH DAILY (Patient taking differently: Take 40 mEq by mouth daily.)  . spironolactone (ALDACTONE) 25 MG tablet Take 25 mg by mouth daily.  Marland Kitchen torsemide (DEMADEX) 100 MG tablet Take by mouth 2 (two) times daily.   Marland Kitchen zinc gluconate 50 MG tablet Take 50 mg by mouth daily.  . isosorbide mononitrate (IMDUR) 30 MG 24 hr tablet Take 1 tablet (30 mg total)  by mouth daily. (Patient taking differently: Take 30 mg by mouth in the morning and at bedtime. 60 MG a day)   No facility-administered encounter medications on file as of 04/23/2021.    Allergies (verified) Entresto [sacubitril-valsartan] and Penicillins   History: Past Medical History:  Diagnosis Date  . Acute respiratory failure due to COVID-19 (HCC) 11/19/2019  . Allergy   . CHF (congestive heart failure) (HCC)   . Coronary artery disease   . Diabetes mellitus without complication (HCC)   . Hyperlipidemia   . Hypertension   . Myocardial infarction (HCC)   .  Pneumonia due to COVID-19 virus 11/19/2019   Past Surgical History:  Procedure Laterality Date  . APPENDECTOMY    . colonscopy  2010   benign polyps  . CORONARY ARTERY BYPASS GRAFT  1995  . CORONARY STENT INTERVENTION N/A 02/03/2017   Procedure: Coronary Stent Intervention;  Surgeon: Alwyn Pea, MD;  Location: [facility] INVASIVE CV LAB;  Service: Cardiovascular;  Laterality: N/A;  . ESOPHAGOGASTRODUODENOSCOPY  2010   normal  . LEFT HEART CATH AND CORONARY ANGIOGRAPHY N/A 02/03/2017   Procedure: Left Heart Cath and Coronary Angiography;  Surgeon: Alwyn Pea, MD;  Location: [facility] INVASIVE CV LAB;  Service: Cardiovascular;  Laterality: N/A;  . PERCUTANEOUS CORONARY STENT INTERVENTION (PCI-S)  2010, 2015   4 stents total   Family History  Problem Relation Age of Onset  . Heart disease Father   . Lung cancer Brother   . Prostate cancer Brother   . Diabetes Mother   . Heart disease Mother   . Heart attack Mother   . Congestive Heart Failure Mother    Social History   Socioeconomic History  . Marital status: Married    Spouse name: Not on file  . Number of children: 8  . Years of education: Not on file  . Highest education level: 11th grade  Occupational History  . Occupation: Retired  Tobacco Use  . Smoking status: Former Smoker    Packs/day: 1.50    Years: 29.00    Pack years: 43.50    Types: Cigarettes    Quit date: 12/30/1994    Years since quitting: 26.3  . Smokeless tobacco: Never Used  . Tobacco comment: smoking cessation materials not required  Vaping Use  . Vaping Use: Never used  Substance and Sexual Activity  . Alcohol use: No    Alcohol/week: 0.0 standard drinks  . Drug use: No  . Sexual activity: Not Currently  Other Topics Concern  . Not on file  Social History Narrative  . Not on file   Social Determinants of Health   Financial Resource Strain: Low Risk   . Difficulty of Paying Living Expenses: Not very hard  Food Insecurity: No Food Insecurity   . Worried About Programme researcher, broadcasting/film/video in the Last Year: Never true  . Ran Out of Food in the Last Year: Never true  Transportation Needs: No Transportation Needs  . Lack of Transportation (Medical): No  . Lack of Transportation (Non-Medical): No  Physical Activity: Inactive  . Days of Exercise per Week: 0 days  . Minutes of Exercise per Session: 0 min  Stress: No Stress Concern Present  . Feeling of Stress : Not at all  Social Connections: Moderately Integrated  . Frequency of Communication with Friends and Family: More than three times a week  . Frequency of Social Gatherings with Friends and Family: More than three times a week  . Attends Religious Services:  More than 4 times per year  . Active Member of Clubs or Organizations: No  . Attends BankerClub or Organization Meetings: Never  . Marital Status: Married    Tobacco Counseling Counseling given: Not Answered Comment: smoking cessation materials not required   Clinical Intake:  Pre-visit preparation completed: Yes  Pain : 0-10 Pain Score: 4  Pain Type: Chronic pain Pain Location: Back Pain Descriptors / Indicators: Aching,Sore Pain Onset: More than a month ago Pain Frequency: Constant     BMI - recorded: 29.12 Nutritional Status: BMI 25 -29 Overweight Nutritional Risks: None Diabetes: Yes CBG done?: No Did pt. bring in CBG monitor from home?: No  How often do you need to have someone help you when you read instructions, pamphlets, or other written materials from your doctor or pharmacy?: 1 - Never  Nutrition Risk Assessment:  Has the patient had any N/V/D within the last 2 months?  No  Does the patient have any non-healing wounds?  No  Has the patient had any unintentional weight loss or weight gain?  No   Diabetes:  Is the patient diabetic?  Yes  If diabetic, was a CBG obtained today?  No  Did the patient bring in their glucometer from home?  No  How often do you monitor your CBG's? 1-2 times daily.    Financial Strains and Diabetes Management:  Are you having any financial strains with the device, your supplies or your medication? No .  Does the patient want to be seen by Chronic Care Management for management of their diabetes?  Yes  Would the patient like to be referred to a Nutritionist or for Diabetic Management?  No   Diabetic Exams:  Diabetic Eye Exam: Completed 09/04/18 negative retinopathy. Overdue for diabetic eye exam. Pt has been advised about the importance in completing this exam. A referral has been placed today. Message sent to referral coordinator for scheduling purposes. Advised pt to expect a call from office referred to regarding appt.  Diabetic Foot Exam: Completed 02/14/21.   Interpreter Needed?: No  Information entered by :: Reather LittlerKasey Elisha Mcgruder LPN   Activities of Daily Living In your present state of health, do you have any difficulty performing the following activities: 04/23/2021  Hearing? Y  Comment declines hearing aids  Vision? N  Difficulty concentrating or making decisions? N  Walking or climbing stairs? N  Dressing or bathing? N  Doing errands, shopping? N  Preparing Food and eating ? N  Using the Toilet? N  In the past six months, have you accidently leaked urine? N  Do you have problems with loss of bowel control? N  Managing your Medications? N  Managing your Finances? N  Housekeeping or managing your Housekeeping? N  Some recent data might be hidden    Patient Care Team: Reubin MilanBerglund, [name], MD as PCP - General (Internal Medicine) Midge MiniumWohl, Darren, MD as Consulting Physician (Gastroenterology) Alwyn Peaallwood, Dwayne D, MD as Consulting Physician (Cardiology) Schnier, Latina CraverGregory G, MD (Vascular Surgery) [place] CraneKing, Bradley Mark, MD as Consulting Physician (Ophthalmology)  Indicate any recent Medical Services you may have received from other than [facility] providers in the past year (date may be approximate).     Assessment:   This is a routine wellness examination  for Gary Gutierrez.  Hearing/Vision screen  Hearing Screening   125Hz  250Hz  500Hz  1000Hz  2000Hz  3000Hz  4000Hz  6000Hz  8000Hz   Right ear:           Left ear:  Comments: Pt c/o hearing difficulty, needs hearing evaluation to assess for hearing aids   Vision Screening Comments: [facility] screenings [facility] at [facility]  Dietary issues and exercise activities discussed: Current Exercise Habits: The patient does not participate in regular exercise at present, Exercise limited by: cardiac condition(s);respiratory conditions(s)  Goals    . DIET - INCREASE WATER INTAKE     Recommend to drink at least 6-8 8oz glasses of water per day.      Depression Screen PHQ 2/9 Scores 04/23/2021 02/14/2021 04/19/2020 03/01/2020 01/18/2020 04/22/2019 07/22/2018  PHQ - 2 Score 0 1 0 0 0 0 0  PHQ- 9 Score - 4 - 1 - - 0    Fall Risk Fall Risk  04/23/2021 02/14/2021 04/19/2020 04/22/2019 01/12/2019  Falls in the past year? 0 0 0 0 0  Comment - - - - -  Number falls in past yr: 0 - 0 0 0  Injury with Fall? 0 - 0 0 0  Risk for fall due to : No Fall Risks - No Fall Risks - -  Risk for fall due to: Comment - - - - -  Follow up Falls prevention discussed Falls evaluation completed Falls prevention discussed Falls evaluation completed -    FALL RISK PREVENTION PERTAINING TO THE HOME:  Any stairs in or around the home? Yes  If so, are there any without handrails? No  Home free of loose throw rugs in walkways, pet beds, electrical cords, etc? Yes  Adequate lighting in your home to reduce risk of falls? Yes   ASSISTIVE DEVICES UTILIZED TO PREVENT FALLS:  Life alert? No  Use of a cane, walker or w/c? No  Grab bars in the bathroom? No  Shower chair or bench in shower? No  Elevated toilet seat or a handicapped toilet? No   TIMED UP AND GO:  Was the test performed? Yes .  Length of time to ambulate 10 feet: 5 sec.   Gait steady and fast without use of assistive device  Cognitive Function:     6CIT  Screen 04/23/2021 04/19/2020 07/22/2018 06/10/2017  What Year? 0 points 0 points 0 points 0 points  What month? 0 points 0 points 0 points 0 points  What time? 0 points 0 points 0 points 0 points  Count back from 20 0 points 0 points 0 points 0 points  Months in reverse 4 points 4 points 2 points 0 points  Repeat phrase 2 points 2 points 2 points 4 points  Total Score Immunizations Immunization History  Administered Date(s) Administered  . Fluad Quad(high Dose 65+) 10/18/2019, 09/28/2020  . Influenza, High Dose Seasonal PF 10/10/2017, 10/27/2018  . Influenza,inj,Quad PF,6+ Mos 01/03/2016  . Pneumococcal Conjugate-13 04/10/2016  . Pneumococcal Polysaccharide-23 06/10/2017  . Tdap 08/16/2013, 04/23/2021    TDAP status: Up to date  Flu Vaccine status: Up to date  Pneumococcal vaccine status: Up to date  Covid-19 vaccine status: Declined, Education has been provided regarding the importance of this vaccine but patient still declined. Advised may receive this vaccine at local pharmacy or Health Dept.or vaccine clinic. Aware to provide a copy of the vaccination record if obtained from local pharmacy or Health Dept. Verbalized acceptance and understanding.  Qualifies for Shingles Vaccine? Yes   Zostavax completed No   Shingrix Completed?: No.    Education has been provided regarding the importance of this vaccine. Patient has been advised to call insurance company to determine  out of pocket expense if they have not yet received this vaccine. Advised may also receive vaccine at local pharmacy or Health Dept. Verbalized acceptance and understanding.  Screening Tests Health Maintenance  Topic Date Due  . COLON CANCER SCREENING ANNUAL FOBT  06/13/2019  . OPHTHALMOLOGY EXAM  09/05/2019  . COVID-19 Vaccine (1) 05/09/2021 (Originally 04/28/1956)  . INFLUENZA VACCINE  07/30/2021  . HEMOGLOBIN A1C  08/14/2021  . FOOT EXAM  02/14/2022  . COLONOSCOPY (Pts 45-64yrs Insurance coverage  will need to be confirmed)  06/19/2023  . TETANUS/TDAP  04/24/2031  . Hepatitis C Screening  Completed  . PNA vac Low Risk Adult  Completed  . HPV VACCINES  Aged Out    Health Maintenance  Health Maintenance Due  Topic Date Due  . COLON CANCER SCREENING ANNUAL FOBT  06/13/2019  . OPHTHALMOLOGY EXAM  09/05/2019   Colorectal cancer screening: Colonoscopy done 2010? Referral sent to gastroenterology today for repeat screening colonoscopy.   Lung Cancer Screening: (Low Dose CT Chest recommended if Age 61-80 years, 30 pack-year currently smoking OR have quit w/in 15years.) does not qualify.   Additional Screening:  Hepatitis C Screening: does qualify; Completed 06/10/17  Vision Screening: Recommended annual ophthalmology exams for early detection of glaucoma and other disorders of the eye. Is the patient up to date with their annual eye exam?  No  Who is the provider or what is the name of the office in which the patient attends annual eye exams? [facility].  Dental Screening: Recommended annual dental exams for proper oral hygiene  Community Resource Referral / Chronic Care Management: CRR required this visit?  No    CCM required this visit?  Yes  - pharmacy for med reconciliation and management.      Plan:     I have personally reviewed and noted the following in the patient's chart:   . Medical and social history . Use of alcohol, tobacco or illicit drugs  . Current medications and supplements . Functional ability and status . Nutritional status . Physical activity . Advanced directives . List of other physicians . Hospitalizations, surgeries, and ER visits in previous 12 months . Vitals . Screenings to include cognitive, depression, and falls . Referrals and appointments  In addition, I have reviewed and discussed with patient certain preventive protocols, quality metrics, and best practice recommendations. A written personalized care plan for preventive  services as well as general preventive health recommendations were provided to patient.     [name], LPN   [id number]/[id number]   Nurse Notes: patient has skin tear on right hand for the past month; pt reports putting tea tree oil and triple antibiotic ointment but still not healing. Pt to see Dr. [name] for same day visit.

## 2021-04-23 NOTE — Chronic Care Management (AMB) (Signed)
  Chronic Care Management   Outreach Note  04/23/2021 Name: Gary Gutierrez MRN: [id number] DOB: 1951/06/14  Gary Gutierrez is a 70 y.o. year old male who is a primary care patient of [name], MD. I reached out to Gary Gutierrez by phone today in response to a referral sent by Gary Gutierrez's PCP, [name], MD.     A telephone outreach was attempted today patient needs to think about copay. The patient was referred to the case management team for assistance with care management and care coordination.   Follow Up Plan: The care management team will reach out to the patient again over the next 7 days.  If patient returns call to provider office, please advise to call Embedded Care Management Care Guide Gwenevere Ghazi at [phone].  Gwenevere Ghazi  Care Guide, Embedded Care Coordination [facility] Management

## 2021-04-23 NOTE — Patient Instructions (Signed)
Mr. Gary Gutierrez , Thank you for taking time to come for your Medicare Wellness Visit. I appreciate your ongoing commitment to your health goals. Please review the following plan we discussed and let me know if I can assist you in the future.   Screening recommendations/referrals: Colonoscopy: due. Referral sent to [facility] today for repeat screening colonoscopy. They will contact your for an appointment.  Recommended yearly ophthalmology/optometry visit for glaucoma screening and checkup Recommended yearly dental visit for hygiene and checkup  Vaccinations: Influenza vaccine: done 09/28/20 Pneumococcal vaccine: done 06/10/17 Tdap vaccine: done 08/16/13 Shingles vaccine: Shingrix discussed. Please contact your pharmacy for coverage information.  Covid-19: declined  Advanced directives: Advance directive discussed with you today. Even though you declined this today please call our office should you change your mind and we can give you the proper paperwork for you to fill out.  Conditions/risks identified:   Free hearing clinics offered in [place]:   [facility] [address], [place], [place] [address] [phone]  [facility] [address] [place], [place], [place] [address] [phone]   Next appointment: Follow up in one year for your annual wellness visit.   Preventive Care 25 Years and Older, Male Preventive care refers to lifestyle choices and visits with your health care provider that can promote health and wellness. What does preventive care include?  A yearly physical exam. This is also called an annual well check.  Dental exams once or twice a year.  Routine eye exams. Ask your health care provider how often you should have your eyes checked.  Personal lifestyle choices, including:  Daily care of your teeth and gums.  Regular physical activity.  Eating a healthy diet.  Avoiding tobacco and drug use.  Limiting  alcohol use.  Practicing safe sex.  Taking low doses of aspirin every day.  Taking vitamin and mineral supplements as recommended by your health care provider. What happens during an annual well check? The services and screenings done by your health care provider during your annual well check will depend on your age, overall health, lifestyle risk factors, and family history of disease. Counseling  Your health care provider may ask you questions about your:  Alcohol use.  Tobacco use.  Drug use.  Emotional well-being.  Home and relationship well-being.  Sexual activity.  Eating habits.  History of falls.  Memory and ability to understand (cognition).  Work and work Astronomer. Screening  You may have the following tests or measurements:  Height, weight, and BMI.  Blood pressure.  Lipid and cholesterol levels. These may be checked every 5 years, or more frequently if you are over 72 years old.  Skin check.  Lung cancer screening. You may have this screening every year starting at age 49 if you have a 30-pack-year history of smoking and currently smoke or have quit within the past 15 years.  Fecal occult blood test (FOBT) of the stool. You may have this test every year starting at age 56.  Flexible sigmoidoscopy or colonoscopy. You may have a sigmoidoscopy every 5 years or a colonoscopy every 10 years starting at age 27.  Prostate cancer screening. Recommendations will vary depending on your family history and other risks.  Hepatitis C blood test.  Hepatitis B blood test.  Sexually transmitted disease (STD) testing.  Diabetes screening. This is done by checking your blood sugar (glucose) after you have not eaten for a while (fasting). You may have this done every 1-3  years.  Abdominal aortic aneurysm (AAA) screening. You may need this if you are a current or former smoker.  Osteoporosis. You may be screened starting at age 74 if you are at high risk. Talk  with your health care provider about your test results, treatment options, and if necessary, the need for more tests. Vaccines  Your health care provider may recommend certain vaccines, such as:  Influenza vaccine. This is recommended every year.  Tetanus, diphtheria, and acellular pertussis (Tdap, Td) vaccine. You may need a Td booster every 10 years.  Zoster vaccine. You may need this after age 25.  Pneumococcal 13-valent conjugate (PCV13) vaccine. One dose is recommended after age 67.  Pneumococcal polysaccharide (PPSV23) vaccine. One dose is recommended after age 82. Talk to your health care provider about which screenings and vaccines you need and how often you need them. This information is not intended to replace advice given to you by your health care provider. Make sure you discuss any questions you have with your health care provider. Document Released: 01/12/2016 Document Revised: 09/04/2016 Document Reviewed: 10/17/2015 Elsevier Interactive Patient Education  2017 [employer].  Fall Prevention in the Home Falls can cause injuries. They can happen to people of all ages. There are many things you can do to make your home safe and to help prevent falls. What can I do on the outside of my home?  Regularly fix the edges of walkways and driveways and fix any cracks.  Remove anything that might make you trip as you walk through a door, such as a raised step or threshold.  Trim any bushes or trees on the path to your home.  Use bright outdoor lighting.  Clear any walking paths of anything that might make someone trip, such as rocks or tools.  Regularly check to see if handrails are loose or broken. Make sure that both sides of any steps have handrails.  Any raised decks and porches should have guardrails on the edges.  Have any leaves, snow, or ice cleared regularly.  Use sand or salt on walking paths during winter.  Clean up any spills in your garage right away. This  includes oil or grease spills. What can I do in the bathroom?  Use night lights.  Install grab bars by the toilet and in the tub and shower. Do not use towel bars as grab bars.  Use non-skid mats or decals in the tub or shower.  If you need to sit down in the shower, use a plastic, non-slip stool.  Keep the floor dry. Clean up any water that spills on the floor as soon as it happens.  Remove soap buildup in the tub or shower regularly.  Attach bath mats securely with double-sided non-slip rug tape.  Do not have throw rugs and other things on the floor that can make you trip. What can I do in the bedroom?  Use night lights.  Make sure that you have a light by your bed that is easy to reach.  Do not use any sheets or blankets that are too big for your bed. They should not hang down onto the floor.  Have a firm chair that has side arms. You can use this for support while you get dressed.  Do not have throw rugs and other things on the floor that can make you trip. What can I do in the kitchen?  Clean up any spills right away.  Avoid walking on wet floors.  Keep items that  you use a lot in easy-to-reach places.  If you need to reach something above you, use a strong step stool that has a grab bar.  Keep electrical cords out of the way.  Do not use floor polish or wax that makes floors slippery. If you must use wax, use non-skid floor wax.  Do not have throw rugs and other things on the floor that can make you trip. What can I do with my stairs?  Do not leave any items on the stairs.  Make sure that there are handrails on both sides of the stairs and use them. Fix handrails that are broken or loose. Make sure that handrails are as long as the stairways.  Check any carpeting to make sure that it is firmly attached to the stairs. Fix any carpet that is loose or worn.  Avoid having throw rugs at the top or bottom of the stairs. If you do have throw rugs, attach them to the  floor with carpet tape.  Make sure that you have a light switch at the top of the stairs and the bottom of the stairs. If you do not have them, ask someone to add them for you. What else can I do to help prevent falls?  Wear shoes that:  Do not have high heels.  Have rubber bottoms.  Are comfortable and fit you well.  Are closed at the toe. Do not wear sandals.  If you use a stepladder:  Make sure that it is fully opened. Do not climb a closed stepladder.  Make sure that both sides of the stepladder are locked into place.  Ask someone to hold it for you, if possible.  Clearly mark and make sure that you can see:  Any grab bars or handrails.  First and last steps.  Where the edge of each step is.  Use tools that help you move around (mobility aids) if they are needed. These include:  Canes.  Walkers.  Scooters.  Crutches.  Turn on the lights when you go into a dark area. Replace any light bulbs as soon as they burn out.  Set up your furniture so you have a clear path. Avoid moving your furniture around.  If any of your floors are uneven, fix them.  If there are any pets around you, be aware of where they are.  Review your medicines with your doctor. Some medicines can make you feel dizzy. This can increase your chance of falling. Ask your doctor what other things that you can do to help prevent falls. This information is not intended to replace advice given to you by your health care provider. Make sure you discuss any questions you have with your health care provider. Document Released: 10/12/2009 Document Revised: 05/23/2016 Document Reviewed: 01/20/2015 Elsevier Interactive Patient Education  2017 [employer].

## 2021-04-30 ENCOUNTER — Telehealth: Payer: Self-pay | Admitting: *Deleted

## 2021-04-30 NOTE — Chronic Care Management (AMB) (Signed)
  Chronic Care Management   Outreach Note  04/30/2021 Name: SAIM ALMANZA MRN: [id number] DOB: [birth year]  Len Blalock Route is a 70 y.o. year old male who is a primary care patient of [name], MD. I reached out to [name] by phone today in response to a referral sent by Mr. Korbyn Chopin Ackers's PCP, [name], MD.     A second unsuccessful telephone outreach was attempted today. The patient was referred to the case management team for assistance with care management and care coordination.   Follow Up Plan: A HIPAA compliant phone message was left for the patient providing contact information and requesting a return call. The care management team will reach out to the patient again over the next 5 days. If patient returns call to provider office, please advise to call Embedded Care Management Care Guide Gwenevere Ghazi at [phone].  Gwenevere Ghazi  Care Guide, Embedded Care Coordination [facility] Management

## 2021-05-03 ENCOUNTER — Telehealth: Payer: Self-pay | Admitting: *Deleted

## 2021-05-03 NOTE — Chronic Care Management (AMB) (Signed)
  Chronic Care Management   Outreach Note  05/03/2021 Name: MASSON NALEPA MRN: [id number] DOB: 07-27-1951  Len Blalock Peerson is a 70 y.o. year old male who is a primary care patient of [name], MD. I reached out to [name] by phone today in response to a referral sent by Mr. Gurley Climer Bevard's PCP, [name], MD.     Third unsuccessful telephone outreach was attempted today. The patient was referred to the case management team for assistance with care management and care coordination. The patient's primary care provider has been notified of our unsuccessful attempts to make or maintain contact with the patient. The care management team is pleased to engage with this patient at any time in the future should he/she be interested in assistance from the care management team.   Follow Up Plan: We have been unable to make contact with the patient. The care management team is available to follow up with the patient after provider conversation with the patient regarding recommendation for care management engagement and subsequent re-referral to the care management team.  A HIPAA compliant phone message was left for the patient providing contact information and requesting a return call.   Gwenevere Ghazi  Care Guide, Embedded Care Coordination [facility] Management  Direct Dial: [phone]

## 2021-05-03 NOTE — Chronic Care Management (AMB) (Signed)
  Chronic Care Management   Note  05/03/2021 Name: Gary Gutierrez MRN: [id number] DOB: January 28, 1951  Gary Gutierrez is a 70 y.o. year old male who is a primary care patient of Glean Hess, MD. I reached out to Marygrace Drought by phone today in response to a referral sent by Gary Gutierrez PCP, Dr. Army Melia.     Gary Gutierrez was given information about Chronic Care Management services today including:  1. CCM service includes personalized support from designated clinical staff supervised by his physician, including individualized plan of care and coordination with other care providers 2. 24/7 contact phone numbers for assistance for urgent and routine care needs. 3. Service will only be billed when office clinical staff spend 20 minutes or more in a month to coordinate care. 4. Only one practitioner may furnish and bill the service in a calendar month. 5. The patient may stop CCM services at any time (effective at the end of the month) by phone call to the office staff. 6. The patient will be responsible for cost sharing (co-pay) of up to 20% of the service fee (after annual deductible is met).  Patient did not agree to enrollment in care management services and does not wish to consider at this time.  Follow up plan: Patient declines further follow up and engagement by the care management team. Appropriate care team members and provider have been notified via electronic communication.   [facility]

## 2021-05-08 ENCOUNTER — Telehealth: Payer: Medicare Other

## 2021-05-11 ENCOUNTER — Other Ambulatory Visit: Payer: Self-pay | Admitting: Internal Medicine

## 2021-06-08 ENCOUNTER — Telehealth: Payer: Medicare Other

## 2021-06-08 ENCOUNTER — Other Ambulatory Visit: Payer: Self-pay

## 2021-06-08 MED ORDER — ACCU-CHEK AVIVA PLUS VI STRP: ORAL_STRIP | 3 refills | Status: DC

## 2021-06-19 ENCOUNTER — Ambulatory Visit: Payer: Medicare Other | Admitting: Internal Medicine

## 2021-06-21 ENCOUNTER — Other Ambulatory Visit: Payer: Self-pay | Admitting: Internal Medicine

## 2021-06-21 DIAGNOSIS — E118 Type 2 diabetes mellitus with unspecified complications: Secondary | ICD-10-CM

## 2021-06-27 ENCOUNTER — Other Ambulatory Visit: Payer: Self-pay

## 2021-06-27 ENCOUNTER — Telehealth: Payer: Self-pay

## 2021-06-27 MED ORDER — ACCU-CHEK AVIVA PLUS VI STRP
ORAL_STRIP | 12 refills | Status: DC
Start: 2021-06-27 — End: 2023-01-29

## 2021-06-27 NOTE — Telephone Encounter (Signed)
Copied from CRM [id number]. Topic: General - Other >> Jun 27, 2021  2:23 PM Gwenlyn Fudge wrote: Reason for CRM: Tobi Bastos, from [facility], calling stating that they are needing to have a diagnosis code for the pts test strips. She states that she will be sending over another fax and is requesting to have this filled out by PCP. Please advise.    Fax# [phone]

## 2021-06-27 NOTE — Telephone Encounter (Signed)
Resent with dx code attached.

## 2021-07-01 ENCOUNTER — Other Ambulatory Visit: Payer: Self-pay | Admitting: Internal Medicine

## 2021-07-01 DIAGNOSIS — I5022 Chronic systolic (congestive) heart failure: Secondary | ICD-10-CM

## 2021-07-01 NOTE — Telephone Encounter (Signed)
Requested Prescriptions  Pending Prescriptions Disp Refills  . nitroGLYCERIN (NITROSTAT) 0.4 MG SL tablet [Pharmacy Med Name: NITROGLYCERIN 0.4MG  SUB TAB 25S] 25 tablet 1    Sig: PLACE ONE TABLET UNDER TONGUE AS NEEDED FOR CHEST PAIN EVERY 5 MINUTES     Cardiovascular:  Nitrates Passed - 07/01/2021 12:10 PM      Passed - Last BP in normal range    BP Readings from Last 1 Encounters:  04/23/21 100/64         Passed - Last Heart Rate in normal range    Pulse Readings from Last 1 Encounters:  04/23/21 90         Passed - Valid encounter within last 12 months    Recent Outpatient Visits          2 months ago Cellulitis of right upper extremity   [facility] [name], MD   4 months ago Type II diabetes mellitus with complication [facility])   [facility] [name], MD   9 months ago Type II diabetes mellitus with complication [facility])   [facility] [name], MD   1 year ago Type II diabetes mellitus with complication [facility])   [facility] [name], MD   1 year ago Cellulitis of toe of left foot   [facility] [name], MD      Future Appointments            In 2 weeks [name] Nyoka Cowden, MD [facility], [facility]

## 2021-07-04 ENCOUNTER — Other Ambulatory Visit: Payer: Self-pay | Admitting: Internal Medicine

## 2021-07-05 ENCOUNTER — Other Ambulatory Visit: Payer: Self-pay | Admitting: Internal Medicine

## 2021-07-05 NOTE — Telephone Encounter (Signed)
  Notes to clinic:  requesting a 90 day supply    Requested Prescriptions  Pending Prescriptions Disp Refills   glipiZIDE (GLUCOTROL XL) 10 MG 24 hr tablet [Pharmacy Med Name: GLIPIZIDE ER 10MG  TABLETS] 90 tablet     Sig: TAKE 1 TABLET(10 MG) BY MOUTH DAILY      Endocrinology:  Diabetes - Sulfonylureas Failed - 07/05/2021  8:19 AM      Failed - HBA1C is between 0 and 7.9 and within 180 days    Hemoglobin A1C  Date Value Ref Range Status  10/11/2014 6.9 (H) 4.2 - 6.3 % Final    Comment:    The American Diabetes Association recommends that a primary goal of therapy should be <7% and that physicians should reevaluate the treatment regimen in patients with HbA1c values consistently >8%.    Hgb A1c MFr Bld  Date Value Ref Range Status  02/14/2021 8.1 (H) 4.8 - 5.6 % Final    Comment:             Prediabetes: 5.7 - 6.4          Diabetes: >6.4          Glycemic control for adults with diabetes: <7.0           Passed - Valid encounter within last 6 months    Recent Outpatient Visits           2 months ago Cellulitis of right upper extremity   [facility], MD   4 months ago Type II diabetes mellitus with complication [facility])   [facility], MD   9 months ago Type II diabetes mellitus with complication [facility])   [facility], MD   1 year ago Type II diabetes mellitus with complication [facility])   [facility], MD   1 year ago Cellulitis of toe of left foot   [facility], MD       Future Appointments             In 2 weeks [name] [name], MD [facility], [facility]

## 2021-07-19 ENCOUNTER — Encounter: Payer: Self-pay | Admitting: Internal Medicine

## 2021-07-19 ENCOUNTER — Ambulatory Visit (INDEPENDENT_AMBULATORY_CARE_PROVIDER_SITE_OTHER): Payer: Medicare Other | Admitting: Internal Medicine

## 2021-07-19 ENCOUNTER — Other Ambulatory Visit: Payer: Self-pay

## 2021-07-19 VITALS — BP 132/74 | HR 91 | Ht 69.0 in | Wt 197.0 lb

## 2021-07-19 DIAGNOSIS — J449 Chronic obstructive pulmonary disease, unspecified: Secondary | ICD-10-CM | POA: Diagnosis not present

## 2021-07-19 DIAGNOSIS — I25119 Atherosclerotic heart disease of native coronary artery with unspecified angina pectoris: Secondary | ICD-10-CM

## 2021-07-19 DIAGNOSIS — E1122 Type 2 diabetes mellitus with diabetic chronic kidney disease: Secondary | ICD-10-CM

## 2021-07-19 DIAGNOSIS — N183 Chronic kidney disease, stage 3 unspecified: Secondary | ICD-10-CM

## 2021-07-19 DIAGNOSIS — I739 Peripheral vascular disease, unspecified: Secondary | ICD-10-CM

## 2021-07-19 DIAGNOSIS — E118 Type 2 diabetes mellitus with unspecified complications: Secondary | ICD-10-CM | POA: Diagnosis not present

## 2021-07-19 NOTE — Progress Notes (Signed)
Date:  07/19/2021   Name:  Gary Gutierrez   DOB:  November 09, 1951   MRN:  [id number]   Chief Complaint: Diabetes (BS unsure but maybe 140 )  Diabetes He presents for his follow-up diabetic visit. He has type 2 diabetes mellitus. His disease course has been stable. Pertinent negatives for hypoglycemia include no headaches or tremors. Pertinent negatives for diabetes include no chest pain, no fatigue, no polydipsia and no polyuria. Symptoms are stable. Diabetic complications include a CVA and nephropathy. Risk factors for coronary artery disease include dyslipidemia and diabetes mellitus. Current diabetic treatment includes insulin injections. His weight is stable. He is following a generally healthy diet. His breakfast blood glucose is taken between 6-7 am. His breakfast blood glucose range is generally 130-140 mg/dl. An ACE inhibitor/angiotensin II receptor blocker is being taken. Eye exam is not current.  COPD -  doing well, no recent flares.  On symbicort intermittently.   Uses albuterol rarely. RI - GFR has been around 55 for the past few visits.  He does not take any nsaids.  His cardiologist was going to refer him to Nephrology but it has been 4 months and no calls.   Lab Results  Component Value Date   CREATININE 1.49 (H) 02/14/2021   BUN 35 (H) 02/14/2021   NA 136 02/14/2021   K 4.0 02/14/2021   CL 94 (L) 02/14/2021   CO2 23 02/14/2021   Lab Results  Component Value Date   CHOL 147 09/28/2020   HDL 36 (L) 09/28/2020   LDLCALC 85 09/28/2020   TRIG 146 09/28/2020   CHOLHDL 4.1 09/28/2020   Lab Results  Component Value Date   TSH 2.85 10/11/2014   Lab Results  Component Value Date   HGBA1C 8.1 (H) 02/14/2021   Lab Results  Component Value Date   WBC 8.5 11/20/2019   HGB 14.6 11/20/2019   HCT 44.0 11/20/2019   MCV 92.6 11/20/2019   PLT 146 (L) 11/20/2019   Lab Results  Component Value Date   ALT 24 02/14/2021   AST 19 02/14/2021   ALKPHOS 185 (H) 02/14/2021    BILITOT 0.6 02/14/2021     Review of Systems  Constitutional:  Negative for appetite change, fatigue and unexpected weight change.  Eyes:  Negative for visual disturbance.  Respiratory:  Negative for cough, shortness of breath and wheezing.   Cardiovascular:  Negative for chest pain, palpitations and leg swelling.  Gastrointestinal:  Negative for abdominal pain and blood in stool.  Endocrine: Negative for polydipsia and polyuria.  Genitourinary:  Negative for dysuria and hematuria.  Skin:  Negative for color change and rash.  Neurological:  Negative for tremors, numbness and headaches.  Psychiatric/Behavioral:  Negative for dysphoric mood.    Patient Active Problem List   Diagnosis Date Noted   CKD stage 3 secondary to diabetes (HCC) 07/06/2020   Thoracic aortic atherosclerosis (HCC) 07/05/2020   Acute on chronic combined systolic and diastolic CHF (congestive heart failure) (HCC) 11/17/2019   Type II diabetes mellitus with complication (HCC) 10/27/2018   Chronic obstructive pulmonary disease (HCC) 02/11/2018   CHF (congestive heart failure) (HCC) 10/29/2017   Polyneuropathy due to type 2 diabetes mellitus (HCC) 07/01/2017   PAD (peripheral artery disease) (HCC) 06/30/2017   Tobacco use disorder, mild, in sustained remission, abuse 04/10/2017   Hx of colonic polyps 04/10/2016   Hearing loss of both ears 01/03/2016   Hyperlipidemia associated with type 2 diabetes mellitus (HCC) 06/09/2015   Essential hypertension  06/09/2015   Multilevel degenerative disc disease 06/09/2015   Coronary artery disease involving native coronary artery of native heart with angina pectoris (HCC) 05/09/2014   Coronary artery disease involving autologous vein bypass graft 05/09/2014    Allergies  Allergen Reactions   Entresto [Sacubitril-Valsartan] Other (See Comments)    Low blood pressure   Penicillins Other (See Comments)    Other reaction(s): Unknown, pt does not know reactions    Past Surgical  History:  Procedure Laterality Date   APPENDECTOMY     colonscopy  2010   benign polyps   CORONARY ARTERY BYPASS GRAFT  1995   CORONARY STENT INTERVENTION N/A 02/03/2017   Procedure: Coronary Stent Intervention;  Surgeon: Alwyn Pea, MD;  Location: [facility] INVASIVE CV LAB;  Service: Cardiovascular;  Laterality: N/A;   ESOPHAGOGASTRODUODENOSCOPY  2010   normal   LEFT HEART CATH AND CORONARY ANGIOGRAPHY N/A 02/03/2017   Procedure: Left Heart Cath and Coronary Angiography;  Surgeon: Alwyn Pea, MD;  Location: [facility] INVASIVE CV LAB;  Service: Cardiovascular;  Laterality: N/A;   PERCUTANEOUS CORONARY STENT INTERVENTION (PCI-S)  2010, 2015   4 stents total    Social History   Tobacco Use   Smoking status: Former    Packs/day: 1.50    Years: 29.00    Pack years: 43.50    Types: Cigarettes    Quit date: 12/30/1994    Years since quitting: 26.5   Smokeless tobacco: Never   Tobacco comments:    smoking cessation materials not required  Vaping Use   Vaping Use: Never used  Substance Use Topics   Alcohol use: No    Alcohol/week: 0.0 standard drinks   Drug use: No     Medication list has been reviewed and updated.  Current Meds  Medication Sig   albuterol (VENTOLIN HFA) 108 (90 Base) MCG/ACT inhaler Inhale 2 puffs into the lungs every 6 (six) hours as needed for wheezing or shortness of breath.   allopurinol (ZYLOPRIM) 300 MG tablet TAKE 1 TABLET(300 MG) BY MOUTH DAILY   Ascorbic Acid (VITAMIN C) 1000 MG tablet Take 1,000 mg by mouth daily.   atorvastatin (LIPITOR) 80 MG tablet TAKE 1 TABLET(80 MG) BY MOUTH AT BEDTIME (Patient taking differently: Take 80 mg by mouth at bedtime.)   budesonide-formoterol (SYMBICORT) 160-4.5 MCG/ACT inhaler Inhale 1 puff into the lungs 2 (two) times daily. (Patient taking differently: Inhale 1 puff into the lungs as needed.)   carvedilol (COREG) 6.25 MG tablet Take 6.25 mg by mouth 2 (two) times daily with a meal.   Cholecalciferol (VITAMIN D3 PO)  Take by mouth.   clopidogrel (PLAVIX) 75 MG tablet TAKE 1 TABLET(75 MG) BY MOUTH DAILY   glipiZIDE (GLUCOTROL XL) 10 MG 24 hr tablet TAKE 1 TABLET(10 MG) BY MOUTH DAILY   glucose blood (ACCU-CHEK AVIVA PLUS) test strip TEST BLOOD SUGAR EVERY DAY AS DIRECTED UP TO 4 TIMES DAILY   ibuprofen (ADVIL) 800 MG tablet TAKE 1 TABLET(800 MG) BY MOUTH DAILY AS NEEDED   Insulin Pen Needle (PEN NEEDLES 31GX5/16") 31G X 8 MM MISC 1 each by Does not apply route daily.   isosorbide mononitrate (IMDUR) 60 MG 24 hr tablet Take 60 mg by mouth daily.   JARDIANCE 25 MG TABS tablet TAKE 1 TABLET BY MOUTH DAILY   LANTUS SOLOSTAR 100 UNIT/ML Solostar Pen ADMINISTER 50 UNITS UNDER THE SKIN DAILY (Patient taking differently: 50 Units.)   losartan (COZAAR) 50 MG tablet Take 50 mg by mouth daily.  nitroGLYCERIN (NITROSTAT) 0.4 MG SL tablet PLACE ONE TABLET UNDER TONGUE AS NEEDED FOR CHEST PAIN EVERY 5 MINUTES   pantoprazole (PROTONIX) 40 MG tablet TAKE 1 TABLET(40 MG) BY MOUTH TWICE DAILY   potassium chloride SA (K-DUR,KLOR-CON) 20 MEQ tablet TAKE 2 TABLETS(40 MEQ) BY MOUTH DAILY (Patient taking differently: Take 40 mEq by mouth daily.)   spironolactone (ALDACTONE) 25 MG tablet Take 25 mg by mouth daily.   torsemide (DEMADEX) 100 MG tablet Take by mouth 2 (two) times daily.    TRADJENTA 5 MG TABS tablet TAKE 1/2 TABLET(2.5 MG) BY MOUTH IN THE MORNING AND AT BEDTIME   zinc gluconate 50 MG tablet Take 50 mg by mouth daily.   [DISCONTINUED] aspirin EC 81 MG tablet Take 81 mg by mouth daily.     PHQ 2/9 Scores 07/19/2021 04/23/2021 02/14/2021 04/19/2020  PHQ - 2 Score 0 0 1 0  PHQ- 9 Score 1 - 4 -    GAD 7 : Generalized Anxiety Score 07/19/2021 02/14/2021  Nervous, Anxious, on Edge 0 0  Control/stop worrying 0 1  Worry too much - different things 0 1  Trouble relaxing 0 0  Restless 0 0  Easily annoyed or irritable 0 0  Afraid - awful might happen 0 0  Total GAD 7 Score 0 2    BP Readings from Last 3 Encounters:   07/19/21 132/74  04/23/21 100/64  04/23/21 100/64    Physical Exam Vitals and nursing note reviewed.  Constitutional:      General: He is not in acute distress.    Appearance: He is well-developed.  HENT:     Head: Normocephalic and atraumatic.  Cardiovascular:     Rate and Rhythm: Normal rate and regular rhythm.     Pulses: Normal pulses.     Heart sounds: No murmur heard. Pulmonary:     Effort: Pulmonary effort is normal. No respiratory distress.     Breath sounds: No wheezing or rhonchi.  Musculoskeletal:     Right lower leg: No edema.     Left lower leg: No edema.  Skin:    General: Skin is warm and dry.     Capillary Refill: Capillary refill takes less than 2 seconds.     Findings: No lesion or rash.  Neurological:     General: No focal deficit present.     Mental Status: He is alert and oriented to person, place, and time.  Psychiatric:        Mood and Affect: Mood normal.        Behavior: Behavior normal.    Wt Readings from Last 3 Encounters:  07/19/21 197 lb (89.4 kg)  04/23/21 197 lb 3.2 oz (89.4 kg)  04/23/21 197 lb 3.2 oz (89.4 kg)    BP 132/74 (BP Location: Right Arm, Patient Position: Sitting, Cuff Size: Large)   Pulse 91   Ht 5\' 9"  (1.753 m)   Wt 197 lb (89.4 kg)   SpO2 97%   BMI 29.09 kg/m   Assessment and Plan: 1. Type II diabetes mellitus with complication (HCC) Clinically stable by exam and report without s/s of hypoglycemia. DM complicated by hypertension and dyslipidemia. Tolerating medications well without side effects or other concerns. - Hemoglobin A1c - Ambulatory referral to Ophthalmology  2. CKD stage 3 secondary to diabetes (HCC) Continue to monitor Avoid NSAIDs Refer to Nephrology if worsening - Basic metabolic panel  3. Chronic obstructive pulmonary disease, unspecified COPD type (HCC) Encourage him to use Symbicort daily Continue  albuterol PRN  4. PAD (peripheral artery disease) (HCC) Stable without skin change or  ulceration Continue statin and Plavix   Partially dictated using Animal nutritionistDragon software. Any errors are unintentional.  Bari EdwardLaura Emmet Messer, MD [facility]  07/19/2021

## 2021-07-20 LAB — BASIC METABOLIC PANEL
BUN/Creatinine Ratio: 23 (ref 10–24)
BUN: 34 mg/dL — ABNORMAL HIGH (ref 8–27)
CO2: 20 mmol/L (ref 20–29)
Calcium: 9.3 mg/dL (ref 8.6–10.2)
Chloride: 99 mmol/L (ref 96–106)
Creatinine, Ser: 1.45 mg/dL — ABNORMAL HIGH (ref 0.76–1.27)
Glucose: 203 mg/dL — ABNORMAL HIGH (ref 65–99)
Potassium: 4.6 mmol/L (ref 3.5–5.2)
Sodium: 137 mmol/L (ref 134–144)
eGFR: 52 mL/min/{1.73_m2} — ABNORMAL LOW (ref >59)

## 2021-07-20 LAB — HEMOGLOBIN A1C
Est. average glucose Bld gHb Est-mCnc: 183 mg/dL
Hgb A1c MFr Bld: 8 % — ABNORMAL HIGH (ref 4.8–5.6)

## 2021-08-08 ENCOUNTER — Other Ambulatory Visit: Payer: Self-pay | Admitting: Internal Medicine

## 2021-08-08 NOTE — Telephone Encounter (Signed)
Requested Prescriptions  Pending Prescriptions Disp Refills  . glipiZIDE (GLUCOTROL XL) 10 MG 24 hr tablet [Pharmacy Med Name: GLIPIZIDE ER 10MG  TABLETS] 30 tablet 2    Sig: TAKE 1 TABLET(10 MG) BY MOUTH DAILY     Endocrinology:  Diabetes - Sulfonylureas Failed - 08/08/2021  6:22 AM      Failed - HBA1C is between 0 and 7.9 and within 180 days    Hemoglobin A1C  Date Value Ref Range Status  10/11/2014 6.9 (H) 4.2 - 6.3 % Final    Comment:    The American Diabetes Association recommends that a primary goal of therapy should be <7% and that physicians should reevaluate the treatment regimen in patients with HbA1c values consistently >8%.    Hgb A1c MFr Bld  Date Value Ref Range Status  07/19/2021 8.0 (H) 4.8 - 5.6 % Final    Comment:             Prediabetes: 5.7 - 6.4          Diabetes: >6.4          Glycemic control for adults with diabetes: <7.0          Passed - Valid encounter within last 6 months    Recent Outpatient Visits          2 weeks ago Type II diabetes mellitus with complication [facility])   [facility], MD   3 months ago Cellulitis of right upper extremity   [facility], [facility]   5 months ago Type II diabetes mellitus with complication [facility])   [facility], MD   10 months ago Type II diabetes mellitus with complication [facility])   [facility], MD   1 year ago Type II diabetes mellitus with complication [facility])   [facility], MD      Future Appointments            In 3 months [name] [name], MD [facility], [facility]

## 2021-08-17 ENCOUNTER — Other Ambulatory Visit: Payer: Self-pay | Admitting: Internal Medicine

## 2021-08-17 NOTE — Telephone Encounter (Signed)
Future OV 11/29/21 Approved per protocol.  Requested Prescriptions  Pending Prescriptions Disp Refills  . pantoprazole (PROTONIX) 40 MG tablet [Pharmacy Med Name: PANTOPRAZOLE 40MG  TABLETS] 180 tablet 0    Sig: TAKE 1 TABLET(40 MG) BY MOUTH TWICE DAILY     Gastroenterology: Proton Pump Inhibitors Passed - 08/17/2021  8:38 AM      Passed - Valid encounter within last 12 months    Recent Outpatient Visits          4 weeks ago Type II diabetes mellitus with complication [facility])   [facility], MD   3 months ago Cellulitis of right upper extremity   [facility], MD   6 months ago Type II diabetes mellitus with complication [facility])   [facility], MD   10 months ago Type II diabetes mellitus with complication [facility])   [facility], MD   1 year ago Type II diabetes mellitus with complication [facility])   [facility], MD      Future Appointments            In 3 months [name] [name], MD [facility], [facility]

## 2021-09-07 ENCOUNTER — Other Ambulatory Visit: Payer: Self-pay | Admitting: Internal Medicine

## 2021-09-09 ENCOUNTER — Other Ambulatory Visit: Payer: Self-pay | Admitting: Internal Medicine

## 2021-09-09 NOTE — Telephone Encounter (Signed)
Requested Prescriptions  Pending Prescriptions Disp Refills  . allopurinol (ZYLOPRIM) 300 MG tablet [Pharmacy Med Name: ALLOPURINOL 300MG  TABLETS] 90 tablet 0    Sig: TAKE 1 TABLET(300 MG) BY MOUTH DAILY     Endocrinology:  Gout Agents Failed - 09/09/2021  8:23 AM      Failed - Uric Acid in normal range and within 360 days    Uric Acid  Date Value Ref Range Status  09/28/2020 3.5 (L) 3.8 - 8.4 mg/dL Final    Comment:               Therapeutic target for gout patients: <6.0         Failed - Cr in normal range and within 360 days    Creatinine  Date Value Ref Range Status  10/11/2014 1.19 0.60 - 1.30 mg/dL Final   Creatinine, Ser  Date Value Ref Range Status  07/19/2021 1.45 (H) 0.76 - 1.27 mg/dL Final         Passed - Valid encounter within last 12 months    Recent Outpatient Visits          1 month ago Type II diabetes mellitus with complication [facility])   [facility], MD   4 months ago Cellulitis of right upper extremity   [facility], MD   6 months ago Type II diabetes mellitus with complication [facility])   [facility], MD   11 months ago Type II diabetes mellitus with complication [facility])   [facility], MD   1 year ago Type II diabetes mellitus with complication [facility])   [facility], MD      Future Appointments            In 2 months [name] [name], MD [facility], [facility]

## 2021-09-17 ENCOUNTER — Other Ambulatory Visit: Payer: Self-pay | Admitting: Internal Medicine

## 2021-09-17 DIAGNOSIS — E118 Type 2 diabetes mellitus with unspecified complications: Secondary | ICD-10-CM

## 2021-09-17 NOTE — Telephone Encounter (Signed)
Future 11/29/21 Approved per protocol.

## 2021-10-08 ENCOUNTER — Other Ambulatory Visit: Payer: Self-pay | Admitting: Internal Medicine

## 2021-10-08 DIAGNOSIS — E118 Type 2 diabetes mellitus with unspecified complications: Secondary | ICD-10-CM

## 2021-10-08 MED ORDER — LINAGLIPTIN 5 MG PO TABS: 5.0000 mg | ORAL_TABLET | Freq: Every day | ORAL | 1 refills | Status: DC

## 2021-11-17 ENCOUNTER — Other Ambulatory Visit: Payer: Self-pay | Admitting: Internal Medicine

## 2021-11-17 NOTE — Telephone Encounter (Signed)
Requested medication (s) are due for refill today: yes  Requested medication (s) are on the active medication list: yes  Last refill:  08/08/21 #30 2 RF  Future visit scheduled: yes  Notes to clinic:  labs due   Requested Prescriptions  Pending Prescriptions Disp Refills   GLIPIZIDE XL 10 MG 24 hr tablet [Pharmacy Med Name: GLIPIZIDE XL 10MG  TABLETS] 30 tablet 2    Sig: TAKE 1 TABLET(10 MG) BY MOUTH DAILY     Endocrinology:  Diabetes - Sulfonylureas Failed - 11/17/2021  1:12 PM      Failed - HBA1C is between 0 and 7.9 and within 180 days    Hemoglobin A1C  Date Value Ref Range Status  10/11/2014 6.9 (H) 4.2 - 6.3 % Final    Comment:    The American Diabetes Association recommends that a primary goal of therapy should be <7% and that physicians should reevaluate the treatment regimen in patients with HbA1c values consistently >8%.    Hgb A1c MFr Bld  Date Value Ref Range Status  07/19/2021 8.0 (H) 4.8 - 5.6 % Final    Comment:             Prediabetes: 5.7 - 6.4          Diabetes: >6.4          Glycemic control for adults with diabetes: <7.0           Passed - Valid encounter within last 6 months    Recent Outpatient Visits           4 months ago Type II diabetes mellitus with complication [facility])   [facility], MD   6 months ago Cellulitis of right upper extremity   [facility], MD   9 months ago Type II diabetes mellitus with complication [facility])   [facility], MD   1 year ago Type II diabetes mellitus with complication [facility])   [facility], MD   1 year ago Type II diabetes mellitus with complication [facility])   [facility], MD       Future Appointments             In 1 week [name] [name], MD [facility], [facility]

## 2021-11-19 ENCOUNTER — Other Ambulatory Visit: Payer: Self-pay | Admitting: Internal Medicine

## 2021-11-20 NOTE — Telephone Encounter (Signed)
Signed yesterday. Requested Prescriptions  Pending Prescriptions Disp Refills  . glipiZIDE (GLUCOTROL XL) 10 MG 24 hr tablet [Pharmacy Med Name: GLIPIZIDE ER 10MG  TABLETS] 90 tablet     Sig: TAKE 1 TABLET(10 MG) BY MOUTH DAILY     Endocrinology:  Diabetes - Sulfonylureas Failed - 11/19/2021  7:01 PM      Failed - HBA1C is between 0 and 7.9 and within 180 days    Hemoglobin A1C  Date Value Ref Range Status  10/11/2014 6.9 (H) 4.2 - 6.3 % Final    Comment:    The American Diabetes Association recommends that a primary goal of therapy should be <7% and that physicians should reevaluate the treatment regimen in patients with HbA1c values consistently >8%.    Hgb A1c MFr Bld  Date Value Ref Range Status  07/19/2021 8.0 (H) 4.8 - 5.6 % Final    Comment:             Prediabetes: 5.7 - 6.4          Diabetes: >6.4          Glycemic control for adults with diabetes: <7.0          Passed - Valid encounter within last 6 months    Recent Outpatient Visits          4 months ago Type II diabetes mellitus with complication [facility])   [facility], MD   7 months ago Cellulitis of right upper extremity   [facility], MD   9 months ago Type II diabetes mellitus with complication [facility])   [facility], MD   1 year ago Type II diabetes mellitus with complication [facility])   [facility], MD   1 year ago Type II diabetes mellitus with complication [facility])   [facility], MD      Future Appointments            In 1 week [name] [name], MD [facility], [facility]

## 2021-11-29 ENCOUNTER — Encounter: Payer: Self-pay | Admitting: Internal Medicine

## 2021-11-29 ENCOUNTER — Ambulatory Visit (INDEPENDENT_AMBULATORY_CARE_PROVIDER_SITE_OTHER): Payer: Medicare Other | Admitting: Internal Medicine

## 2021-11-29 ENCOUNTER — Other Ambulatory Visit: Payer: Self-pay

## 2021-11-29 VITALS — BP 122/78 | HR 84 | Ht 69.0 in | Wt 191.6 lb

## 2021-11-29 DIAGNOSIS — E1122 Type 2 diabetes mellitus with diabetic chronic kidney disease: Secondary | ICD-10-CM | POA: Diagnosis not present

## 2021-11-29 DIAGNOSIS — E1169 Type 2 diabetes mellitus with other specified complication: Secondary | ICD-10-CM

## 2021-11-29 DIAGNOSIS — E118 Type 2 diabetes mellitus with unspecified complications: Secondary | ICD-10-CM | POA: Diagnosis not present

## 2021-11-29 DIAGNOSIS — N183 Chronic kidney disease, stage 3 unspecified: Secondary | ICD-10-CM | POA: Diagnosis not present

## 2021-11-29 DIAGNOSIS — E785 Hyperlipidemia, unspecified: Secondary | ICD-10-CM | POA: Diagnosis not present

## 2021-11-29 DIAGNOSIS — J441 Chronic obstructive pulmonary disease with (acute) exacerbation: Secondary | ICD-10-CM | POA: Diagnosis not present

## 2021-11-29 DIAGNOSIS — E1142 Type 2 diabetes mellitus with diabetic polyneuropathy: Secondary | ICD-10-CM

## 2021-11-29 DIAGNOSIS — I25119 Atherosclerotic heart disease of native coronary artery with unspecified angina pectoris: Secondary | ICD-10-CM | POA: Diagnosis not present

## 2021-11-29 MED ORDER — PREDNISONE 10 MG PO TABS: 10.0000 mg | ORAL_TABLET | ORAL | 0 refills | Status: AC

## 2021-11-29 MED ORDER — PROMETHAZINE-DM 6.25-15 MG/5ML PO SYRP: 5.0000 mL | ORAL_SOLUTION | Freq: Four times a day (QID) | ORAL | 0 refills | Status: DC | PRN

## 2021-11-29 MED ORDER — AZITHROMYCIN 250 MG PO TABS: ORAL_TABLET | ORAL | 0 refills | Status: AC

## 2021-11-29 NOTE — Progress Notes (Signed)
Date:  11/29/2021   Name:  Gary Gutierrez   DOB:  15-Jan-1951   MRN:  [id number]   Chief Complaint: Diabetes  Diabetes He presents for his follow-up diabetic visit. He has type 2 diabetes mellitus. His disease course has been stable. Pertinent negatives for hypoglycemia include no dizziness, headaches or nervousness/anxiousness. Associated symptoms include fatigue. Pertinent negatives for diabetes include no chest pain. Current diabetic treatment includes insulin injections (jardiance and glipizide and tradjenta). He is compliant with treatment most of the time. His weight is stable. An ACE inhibitor/angiotensin II receptor blocker is being taken.  Hypertension Associated symptoms include shortness of breath. Pertinent negatives include no chest pain, headaches or palpitations. Past treatments include angiotensin blockers and beta blockers. There are no compliance problems.   Hyperlipidemia This is a chronic problem. The problem is controlled. Associated symptoms include shortness of breath. Pertinent negatives include no chest pain. Current antihyperlipidemic treatment includes statins. The current treatment provides significant improvement of lipids.  Cough This is a new problem. The current episode started in the past 7 days. The problem has been gradually worsening. The problem occurs every few minutes. The cough is Productive of sputum. Associated symptoms include nasal congestion, shortness of breath and wheezing. Pertinent negatives include no chest pain, chills, fever, headaches or rash. The symptoms are aggravated by exercise and lying down. He has tried steroid inhaler and a beta-agonist inhaler for the symptoms. The treatment provided mild (just started last night) relief. His past medical history is significant for COPD.   Lab Results  Component Value Date   NA 137 07/19/2021   K 4.6 07/19/2021   CO2 20 07/19/2021   GLUCOSE 203 (H) 07/19/2021   BUN 34 (H) 07/19/2021    CREATININE 1.45 (H) 07/19/2021   CALCIUM 9.3 07/19/2021   EGFR 52 (L) 07/19/2021   GFRNONAA 47 (L) 02/14/2021   Lab Results  Component Value Date   CHOL 147 09/28/2020   HDL 36 (L) 09/28/2020   LDLCALC 85 09/28/2020   TRIG 146 09/28/2020   CHOLHDL 4.1 09/28/2020   Lab Results  Component Value Date   TSH 2.85 10/11/2014   Lab Results  Component Value Date   HGBA1C 8.0 (H) 07/19/2021   Lab Results  Component Value Date   WBC 8.5 11/20/2019   HGB 14.6 11/20/2019   HCT 44.0 11/20/2019   MCV 92.6 11/20/2019   PLT 146 (L) 11/20/2019   Lab Results  Component Value Date   ALT 24 02/14/2021   AST 19 02/14/2021   ALKPHOS 185 (H) 02/14/2021   BILITOT 0.6 02/14/2021   No results found for: 25OHVITD2, 25OHVITD3, VD25OH   Review of Systems  Constitutional:  Positive for fatigue and unexpected weight change. Negative for chills and fever.  HENT:  Negative for congestion.   Respiratory:  Positive for cough, chest tightness, shortness of breath and wheezing.   Cardiovascular:  Negative for chest pain, palpitations and leg swelling.  Gastrointestinal:  Positive for nausea. Negative for abdominal pain, constipation and vomiting.  Musculoskeletal:  Positive for back pain.  Skin:  Negative for color change and rash.  Neurological:  Negative for dizziness, light-headedness and headaches.  Psychiatric/Behavioral:  Negative for dysphoric mood and sleep disturbance. The patient is not nervous/anxious.    Patient Active Problem List   Diagnosis Date Noted   CKD stage 3 secondary to diabetes ([place]) 07/06/2020   Thoracic aortic atherosclerosis ([place]) 07/05/2020   Acute on chronic combined systolic and diastolic CHF (congestive  heart failure) ([place]) 11/17/2019   Type II diabetes mellitus with complication ([place]) 68/07/8109   Chronic obstructive pulmonary disease ([place]) 02/11/2018   CHF (congestive heart failure) ([place]) 10/29/2017   Polyneuropathy due to type 2 diabetes mellitus ([place]) 07/01/2017    PAD (peripheral artery disease) ([place]) 06/30/2017   Tobacco use disorder, mild, in sustained remission, abuse 04/10/2017   Hx of colonic polyps 04/10/2016   Hearing loss of both ears 01/03/2016   Hyperlipidemia associated with type 2 diabetes mellitus ([place]) 06/09/2015   Essential hypertension 06/09/2015   Multilevel degenerative disc disease 06/09/2015   Coronary artery disease involving native coronary artery of native heart with angina pectoris ([place]) 05/09/2014   Coronary artery disease involving autologous vein bypass graft 05/09/2014    Allergies  Allergen Reactions   Entresto [Sacubitril-Valsartan] Other (See Comments)    Low blood pressure   Penicillins Other (See Comments)    Other reaction(s): Unknown, pt does not know reactions    Past Surgical History:  Procedure Laterality Date   APPENDECTOMY     colonscopy  2010   benign polyps   CORONARY ARTERY BYPASS GRAFT  1995   CORONARY STENT INTERVENTION N/A 02/03/2017   Procedure: Coronary Stent Intervention;  Surgeon: Yolonda Kida, MD;  Location: [facility];  Service: Cardiovascular;  Laterality: N/A;   ESOPHAGOGASTRODUODENOSCOPY  2010   normal   LEFT HEART CATH AND CORONARY ANGIOGRAPHY N/A 02/03/2017   Procedure: Left Heart Cath and Coronary Angiography;  Surgeon: Yolonda Kida, MD;  Location: [facility];  Service: Cardiovascular;  Laterality: N/A;   PERCUTANEOUS CORONARY STENT INTERVENTION (PCI-S)  2010, 2015   4 stents total    Social History   Tobacco Use   Smoking status: Former    Packs/day: 1.50    Years: 29.00    Pack years: 43.50    Types: Cigarettes    Quit date: 12/30/1994    Years since quitting: 26.9   Smokeless tobacco: Never   Tobacco comments:    smoking cessation materials not required  Vaping Use   Vaping Use: Never used  Substance Use Topics   Alcohol use: No    Alcohol/week: 0.0 standard drinks   Drug use: No     Medication list has been reviewed and  updated.  Current Meds  Medication Sig   albuterol (VENTOLIN HFA) 108 (90 Base) MCG/ACT inhaler Inhale 2 puffs into the lungs every 6 (six) hours as needed for wheezing or shortness of breath.   allopurinol (ZYLOPRIM) 300 MG tablet TAKE 1 TABLET(300 MG) BY MOUTH DAILY   Ascorbic Acid (VITAMIN C) 1000 MG tablet Take 1,000 mg by mouth daily.   atorvastatin (LIPITOR) 80 MG tablet TAKE 1 TABLET(80 MG) BY MOUTH AT BEDTIME (Patient taking differently: Take 80 mg by mouth at bedtime.)   azithromycin (ZITHROMAX Z-PAK) 250 MG tablet UAD   budesonide-formoterol (SYMBICORT) 160-4.5 MCG/ACT inhaler Inhale 1 puff into the lungs 2 (two) times daily. (Patient taking differently: Inhale 1 puff into the lungs as needed.)   carvedilol (COREG) 6.25 MG tablet Take 6.25 mg by mouth 2 (two) times daily with a meal.   Cholecalciferol (VITAMIN D3 PO) Take by mouth.   clopidogrel (PLAVIX) 75 MG tablet TAKE 1 TABLET(75 MG) BY MOUTH DAILY   GLIPIZIDE XL 10 MG 24 hr tablet TAKE 1 TABLET(10 MG) BY MOUTH DAILY   glucose blood (ACCU-CHEK AVIVA PLUS) test strip TEST BLOOD SUGAR EVERY DAY AS DIRECTED UP TO 4 TIMES DAILY  ibuprofen (ADVIL) 800 MG tablet TAKE 1 TABLET(800 MG) BY MOUTH DAILY AS NEEDED   Insulin Pen Needle (PEN NEEDLES 31GX5/16") 31G X 8 MM MISC 1 each by Does not apply route daily.   isosorbide mononitrate (IMDUR) 60 MG 24 hr tablet Take 60 mg by mouth daily.   JARDIANCE 25 MG TABS tablet TAKE 1 TABLET BY MOUTH DAILY   LANTUS SOLOSTAR 100 UNIT/ML Solostar Pen ADMINISTER 50 UNITS UNDER THE SKIN DAILY (Patient taking differently: 50 Units.)   linagliptin (TRADJENTA) 5 MG TABS tablet Take 1 tablet (5 mg total) by mouth daily.   losartan (COZAAR) 50 MG tablet Take 50 mg by mouth daily.   nitroGLYCERIN (NITROSTAT) 0.4 MG SL tablet PLACE ONE TABLET UNDER TONGUE AS NEEDED FOR CHEST PAIN EVERY 5 MINUTES   pantoprazole (PROTONIX) 40 MG tablet TAKE 1 TABLET(40 MG) BY MOUTH TWICE DAILY   potassium chloride SA  (K-DUR,KLOR-CON) 20 MEQ tablet TAKE 2 TABLETS(40 MEQ) BY MOUTH DAILY (Patient taking differently: Take 40 mEq by mouth daily.)   predniSONE (DELTASONE) 10 MG tablet Take 1 tablet (10 mg total) by mouth as directed for 6 days. Take 6,5,4,3,2,1 then stop   promethazine-dextromethorphan (PROMETHAZINE-DM) 6.25-15 MG/5ML syrup Take 5 mLs by mouth 4 (four) times daily as needed for cough.   spironolactone (ALDACTONE) 25 MG tablet Take 25 mg by mouth daily.   torsemide (DEMADEX) 100 MG tablet Take by mouth 2 (two) times daily.    zinc gluconate 50 MG tablet Take 50 mg by mouth daily.    PHQ 2/9 Scores 11/29/2021 07/19/2021 04/23/2021 02/14/2021  PHQ - 2 Score 0 0 0 1  PHQ- 9 Score 0 1 - 4    GAD 7 : Generalized Anxiety Score 11/29/2021 07/19/2021 02/14/2021  Nervous, Anxious, on Edge 0 0 0  Control/stop worrying 0 0 1  Worry too much - different things 0 0 1  Trouble relaxing 0 0 0  Restless 0 0 0  Easily annoyed or irritable 0 0 0  Afraid - awful might happen 0 0 0  Total GAD 7 Score 0 0 2  Anxiety Difficulty Not difficult at all - -    BP Readings from Last 3 Encounters:  11/29/21 122/78  07/19/21 132/74  04/23/21 100/64    Physical Exam Vitals and nursing note reviewed.  Constitutional:      General: He is not in acute distress.    Appearance: Normal appearance. He is well-developed.  HENT:     Head: Normocephalic and atraumatic.  Cardiovascular:     Rate and Rhythm: Normal rate and regular rhythm.  Pulmonary:     Effort: Pulmonary effort is normal. No respiratory distress.     Breath sounds: Examination of the right-upper field reveals wheezing. Examination of the left-upper field reveals wheezing. Examination of the right-lower field reveals rhonchi. Decreased breath sounds, wheezing and rhonchi present.  Abdominal:     General: Abdomen is flat.     Palpations: Abdomen is soft.  Musculoskeletal:     Cervical back: Normal range of motion.     Right lower leg: No edema.     Left  lower leg: No edema.  Lymphadenopathy:     Cervical: No cervical adenopathy.  Skin:    General: Skin is warm and dry.     Capillary Refill: Capillary refill takes less than 2 seconds.     Findings: No rash.  Neurological:     General: No focal deficit present.     Mental Status: He  is alert and oriented to person, place, and time.  Psychiatric:        Mood and Affect: Mood normal.        Behavior: Behavior normal.    Wt Readings from Last 3 Encounters:  11/29/21 191 lb 9.6 oz (86.9 kg)  07/19/21 197 lb (89.4 kg)  04/23/21 197 lb 3.2 oz (89.4 kg)    BP 122/78   Pulse 84   Ht 5' 9"  (1.753 m)   Wt 191 lb 9.6 oz (86.9 kg)   BMI 28.29 kg/m   Assessment and Plan: 1. Acute exacerbation of chronic obstructive pulmonary disease (COPD) (HCC) Continue Symbicort bid; albuterol MDI q 6 hours as needed Unable to detect O2 sat but color is good and he mentation is intact. - predniSONE (DELTASONE) 10 MG tablet; Take 1 tablet (10 mg total) by mouth as directed for 6 days. Take 6,5,4,3,2,1 then stop  Dispense: 21 tablet; Refill: 0 - promethazine-dextromethorphan (PROMETHAZINE-DM) 6.25-15 MG/5ML syrup; Take 5 mLs by mouth 4 (four) times daily as needed for cough.  Dispense: 180 mL; Refill: 0 - CBC with Differential/Platelet - azithromycin (ZITHROMAX Z-PAK) 250 MG tablet; UAD  Dispense: 6 each; Refill: 0  2. Type II diabetes mellitus with complication (HCC) Has not been as consistent with medications recently due to illness He will continue insulin, jardiance, tradjenta and glipizide. - Hemoglobin A1c  3. Polyneuropathy due to type 2 diabetes mellitus (HCC) Stable; no foot ulcers noted  4. CKD stage 3 secondary to diabetes [facility]) On Jardiance which may have some benefit similar to [place] - Comprehensive metabolic panel  5. Hyperlipidemia associated with type 2 diabetes mellitus ([place]) Tolerating statin medication without side effects at this time Continue same therapy without change at  this time. - Lipid panel   Partially dictated using Editor, commissioning. Any errors are unintentional.  Halina Maidens, MD [facility]  11/29/2021

## 2021-11-30 LAB — CBC WITH DIFFERENTIAL/PLATELET
Basophils Absolute: 0 10*3/uL (ref 0.0–0.2)
Basos: 0 %
EOS (ABSOLUTE): 0.1 10*3/uL (ref 0.0–0.4)
Eos: 1 %
Hematocrit: 40.7 % (ref 37.5–51.0)
Hemoglobin: 13.9 g/dL (ref 13.0–17.7)
Immature Grans (Abs): 0.2 10*3/uL — ABNORMAL HIGH (ref 0.0–0.1)
Immature Granulocytes: 1 %
Lymphocytes Absolute: 1.3 10*3/uL (ref 0.7–3.1)
Lymphs: 10 %
MCH: 31.1 pg (ref 26.6–33.0)
MCHC: 34.2 g/dL (ref 31.5–35.7)
MCV: 91 fL (ref 79–97)
Monocytes Absolute: 1.1 10*3/uL — ABNORMAL HIGH (ref 0.1–0.9)
Monocytes: 8 %
Neutrophils Absolute: 10.2 10*3/uL — ABNORMAL HIGH (ref 1.4–7.0)
Neutrophils: 80 %
Platelets: 242 10*3/uL (ref 150–450)
RBC: 4.47 x10E6/uL (ref 4.14–5.80)
RDW: 12.8 % (ref 11.6–15.4)
WBC: 12.9 10*3/uL — ABNORMAL HIGH (ref 3.4–10.8)

## 2021-11-30 LAB — COMPREHENSIVE METABOLIC PANEL
ALT: 54 IU/L — ABNORMAL HIGH (ref 0–44)
AST: 40 IU/L (ref 0–40)
Albumin/Globulin Ratio: 1.3 (ref 1.2–2.2)
Albumin: 3.9 g/dL (ref 3.7–4.7)
Alkaline Phosphatase: 132 IU/L — ABNORMAL HIGH (ref 44–121)
BUN/Creatinine Ratio: 28 — ABNORMAL HIGH (ref 10–24)
BUN: 41 mg/dL — ABNORMAL HIGH (ref 8–27)
Bilirubin Total: 0.7 mg/dL (ref 0.0–1.2)
CO2: 24 mmol/L (ref 20–29)
Calcium: 8.5 mg/dL — ABNORMAL LOW (ref 8.6–10.2)
Chloride: 95 mmol/L — ABNORMAL LOW (ref 96–106)
Creatinine, Ser: 1.47 mg/dL — ABNORMAL HIGH (ref 0.76–1.27)
Globulin, Total: 3.1 g/dL (ref 1.5–4.5)
Glucose: 148 mg/dL — ABNORMAL HIGH (ref 70–99)
Potassium: 5 mmol/L (ref 3.5–5.2)
Sodium: 136 mmol/L (ref 134–144)
Total Protein: 7 g/dL (ref 6.0–8.5)
eGFR: 51 mL/min/{1.73_m2} — ABNORMAL LOW (ref >59)

## 2021-11-30 LAB — LIPID PANEL
Chol/HDL Ratio: 5.7 ratio — ABNORMAL HIGH (ref 0.0–5.0)
Cholesterol, Total: 126 mg/dL (ref 100–199)
HDL: 22 mg/dL — ABNORMAL LOW (ref >39)
LDL Chol Calc (NIH): 76 mg/dL (ref 0–99)
Triglycerides: 161 mg/dL — ABNORMAL HIGH (ref 0–149)
VLDL Cholesterol Cal: 28 mg/dL (ref 5–40)

## 2021-11-30 LAB — HEMOGLOBIN A1C
Est. average glucose Bld gHb Est-mCnc: 183 mg/dL
Hgb A1c MFr Bld: 8 % — ABNORMAL HIGH (ref 4.8–5.6)

## 2021-12-10 ENCOUNTER — Telehealth: Payer: Self-pay | Admitting: Internal Medicine

## 2021-12-10 NOTE — Telephone Encounter (Signed)
Please call pt to schedule an appt.  KP

## 2021-12-10 NOTE — Telephone Encounter (Signed)
Copied from CRM [id number]. Topic: General - Other >> Dec 10, 2021  9:00 AM Trula Slade wrote: Reason for CRM: Patient stated he has taken and done everything that was prescribed for him to do but he is still experiencing pain in his chest and have a lot of phlegm.  He would like to know if there is something else he can do or if he needed another appointment to see the doctor.

## 2021-12-13 ENCOUNTER — Telehealth: Payer: Self-pay

## 2021-12-13 ENCOUNTER — Other Ambulatory Visit: Payer: Self-pay

## 2021-12-13 ENCOUNTER — Ambulatory Visit (INDEPENDENT_AMBULATORY_CARE_PROVIDER_SITE_OTHER): Payer: Medicare Other | Admitting: Internal Medicine

## 2021-12-13 ENCOUNTER — Encounter: Payer: Self-pay | Admitting: Internal Medicine

## 2021-12-13 VITALS — BP 122/70 | HR 97 | Ht 69.0 in | Wt 191.6 lb

## 2021-12-13 DIAGNOSIS — E1142 Type 2 diabetes mellitus with diabetic polyneuropathy: Secondary | ICD-10-CM

## 2021-12-13 DIAGNOSIS — K219 Gastro-esophageal reflux disease without esophagitis: Secondary | ICD-10-CM | POA: Diagnosis not present

## 2021-12-13 DIAGNOSIS — I25119 Atherosclerotic heart disease of native coronary artery with unspecified angina pectoris: Secondary | ICD-10-CM | POA: Diagnosis not present

## 2021-12-13 MED ORDER — GABAPENTIN 100 MG PO CAPS
100.0000 mg | ORAL_CAPSULE | Freq: Three times a day (TID) | ORAL | 3 refills | Status: DC
Start: 2021-12-13 — End: 2023-04-28

## 2021-12-13 MED ORDER — ESOMEPRAZOLE MAGNESIUM 40 MG PO CPDR: 40.0000 mg | DELAYED_RELEASE_CAPSULE | Freq: Two times a day (BID) | ORAL | 1 refills | Status: DC

## 2021-12-13 NOTE — Telephone Encounter (Signed)
Noted  KP 

## 2021-12-13 NOTE — Progress Notes (Signed)
Date:  12/13/2021   Name:  Gary Gutierrez   DOB:  1951/07/07   MRN:  [id number]   Chief Complaint: Foot Pain (Bilateral Foot Pain.) and Gastroesophageal Reflux (Pantoprazole only works at times. Nexium is was helped in the past but his insurance does not cover this.)  Foot Injury  There was no injury mechanism. The pain is present in the left foot and right foot. The pain is moderate. The pain has been Fluctuating since onset. Associated symptoms include an inability to bear weight, numbness (and burning in both feet) and tingling.  Gastroesophageal Reflux He complains of belching, choking, coughing, dysphagia, heartburn, water brash and wheezing. He reports no chest pain or no sore throat. This is a chronic problem. The current episode started in the past 7 days. The problem occurs frequently. The problem has been gradually worsening. The symptoms are aggravated by lying down and certain foods. Pertinent negatives include no fatigue. He has tried a PPI for the symptoms. The treatment provided mild relief. Past procedures do not include an EGD. Pantoprazole only works at times. Nexium is was helped in the past but his insurance does not cover this.  Lab Results  Component Value Date   NA 136 11/29/2021   K 5.0 11/29/2021   CO2 24 11/29/2021   GLUCOSE 148 (H) 11/29/2021   BUN 41 (H) 11/29/2021   CREATININE 1.47 (H) 11/29/2021   CALCIUM 8.5 (L) 11/29/2021   EGFR 51 (L) 11/29/2021   GFRNONAA 47 (L) 02/14/2021   Lab Results  Component Value Date   CHOL 126 11/29/2021   HDL 22 (L) 11/29/2021   LDLCALC 76 11/29/2021   TRIG 161 (H) 11/29/2021   CHOLHDL 5.7 (H) 11/29/2021   Lab Results  Component Value Date   TSH 2.85 10/11/2014   Lab Results  Component Value Date   HGBA1C 8.0 (H) 11/29/2021   Lab Results  Component Value Date   WBC 12.9 (H) 11/29/2021   HGB 13.9 11/29/2021   HCT 40.7 11/29/2021   MCV 91 11/29/2021   PLT 242 11/29/2021   Lab Results  Component Value  Date   ALT 54 (H) 11/29/2021   AST 40 11/29/2021   ALKPHOS 132 (H) 11/29/2021   BILITOT 0.7 11/29/2021   No results found for: 25OHVITD2, 25OHVITD3, VD25OH   Review of Systems  Constitutional:  Negative for chills, fatigue and fever.  HENT:  Negative for sore throat.   Respiratory:  Positive for cough, choking, shortness of breath and wheezing.   Cardiovascular:  Negative for chest pain, palpitations and leg swelling.  Gastrointestinal:  Positive for dysphagia and heartburn.  Neurological:  Positive for tingling and numbness (and burning in both feet). Negative for dizziness and headaches.  Psychiatric/Behavioral:  Positive for sleep disturbance.    Patient Active Problem List   Diagnosis Date Noted   CKD stage 3 secondary to diabetes ([place]) 07/06/2020   Thoracic aortic atherosclerosis ([place]) 07/05/2020   Acute on chronic combined systolic and diastolic CHF (congestive heart failure) ([place]) 11/17/2019   Type II diabetes mellitus with complication ([place]) 71/24/5809   Chronic obstructive pulmonary disease ([place]) 02/11/2018   CHF (congestive heart failure) ([place]) 10/29/2017   Polyneuropathy due to type 2 diabetes mellitus ([place]) 07/01/2017   PAD (peripheral artery disease) ([place]) 06/30/2017   Tobacco use disorder, mild, in sustained remission, abuse 04/10/2017   Hx of colonic polyps 04/10/2016   Hearing loss of both ears 01/03/2016   Chronic GERD 06/09/2015   Hyperlipidemia associated  with type 2 diabetes mellitus ([place]) 06/09/2015   Essential hypertension 06/09/2015   Multilevel degenerative disc disease 06/09/2015   Coronary artery disease involving native coronary artery of native heart with angina pectoris ([place]) 05/09/2014   Coronary artery disease involving autologous vein bypass graft 05/09/2014    Allergies  Allergen Reactions   Entresto [Sacubitril-Valsartan] Other (See Comments)    Low blood pressure   Penicillins Other (See Comments)    Other reaction(s): Unknown, pt does not  know reactions    Past Surgical History:  Procedure Laterality Date   APPENDECTOMY     colonscopy  2010   benign polyps   CORONARY ARTERY BYPASS GRAFT  1995   CORONARY STENT INTERVENTION N/A 02/03/2017   Procedure: Coronary Stent Intervention;  Surgeon: Yolonda Kida, MD;  Location: [facility];  Service: Cardiovascular;  Laterality: N/A;   ESOPHAGOGASTRODUODENOSCOPY  2010   normal   LEFT HEART CATH AND CORONARY ANGIOGRAPHY N/A 02/03/2017   Procedure: Left Heart Cath and Coronary Angiography;  Surgeon: Yolonda Kida, MD;  Location: [facility];  Service: Cardiovascular;  Laterality: N/A;   PERCUTANEOUS CORONARY STENT INTERVENTION (PCI-S)  2010, 2015   4 stents total    Social History   Tobacco Use   Smoking status: Former    Packs/day: 1.50    Years: 29.00    Pack years: 43.50    Types: Cigarettes    Quit date: 12/30/1994    Years since quitting: 26.9   Smokeless tobacco: Never   Tobacco comments:    smoking cessation materials not required  Vaping Use   Vaping Use: Never used  Substance Use Topics   Alcohol use: No    Alcohol/week: 0.0 standard drinks   Drug use: No     Medication list has been reviewed and updated.  Current Meds  Medication Sig   albuterol (VENTOLIN HFA) 108 (90 Base) MCG/ACT inhaler Inhale 2 puffs into the lungs every 6 (six) hours as needed for wheezing or shortness of breath.   allopurinol (ZYLOPRIM) 300 MG tablet TAKE 1 TABLET(300 MG) BY MOUTH DAILY   Ascorbic Acid (VITAMIN C) 1000 MG tablet Take 1,000 mg by mouth daily.   atorvastatin (LIPITOR) 80 MG tablet TAKE 1 TABLET(80 MG) BY MOUTH AT BEDTIME (Patient taking differently: Take 80 mg by mouth at bedtime.)   budesonide-formoterol (SYMBICORT) 160-4.5 MCG/ACT inhaler Inhale 1 puff into the lungs 2 (two) times daily. (Patient taking differently: Inhale 1 puff into the lungs as needed.)   carvedilol (COREG) 6.25 MG tablet Take 6.25 mg by mouth 2 (two) times daily with a meal.    Cholecalciferol (VITAMIN D3 PO) Take by mouth.   clopidogrel (PLAVIX) 75 MG tablet TAKE 1 TABLET(75 MG) BY MOUTH DAILY   GLIPIZIDE XL 10 MG 24 hr tablet TAKE 1 TABLET(10 MG) BY MOUTH DAILY   glucose blood (ACCU-CHEK AVIVA PLUS) test strip TEST BLOOD SUGAR EVERY DAY AS DIRECTED UP TO 4 TIMES DAILY   ibuprofen (ADVIL) 800 MG tablet TAKE 1 TABLET(800 MG) BY MOUTH DAILY AS NEEDED   Insulin Pen Needle (PEN NEEDLES 31GX5/16") 31G X 8 MM MISC 1 each by Does not apply route daily.   isosorbide mononitrate (IMDUR) 60 MG 24 hr tablet Take 60 mg by mouth daily.   JARDIANCE 25 MG TABS tablet TAKE 1 TABLET BY MOUTH DAILY   LANTUS SOLOSTAR 100 UNIT/ML Solostar Pen ADMINISTER 50 UNITS UNDER THE SKIN DAILY (Patient taking differently: 50 Units.)   linagliptin (TRADJENTA)  5 MG TABS tablet Take 1 tablet (5 mg total) by mouth daily.   losartan (COZAAR) 50 MG tablet Take 50 mg by mouth daily.   nitroGLYCERIN (NITROSTAT) 0.4 MG SL tablet PLACE ONE TABLET UNDER TONGUE AS NEEDED FOR CHEST PAIN EVERY 5 MINUTES   potassium chloride SA (K-DUR,KLOR-CON) 20 MEQ tablet TAKE 2 TABLETS(40 MEQ) BY MOUTH DAILY (Patient taking differently: Take 40 mEq by mouth daily.)   promethazine-dextromethorphan (PROMETHAZINE-DM) 6.25-15 MG/5ML syrup Take 5 mLs by mouth 4 (four) times daily as needed for cough.   spironolactone (ALDACTONE) 25 MG tablet Take 25 mg by mouth daily.   torsemide (DEMADEX) 100 MG tablet Take by mouth 2 (two) times daily.    zinc gluconate 50 MG tablet Take 50 mg by mouth daily.   [DISCONTINUED] pantoprazole (PROTONIX) 40 MG tablet TAKE 1 TABLET(40 MG) BY MOUTH TWICE DAILY    PHQ 2/9 Scores 12/13/2021 11/29/2021 07/19/2021 04/23/2021  PHQ - 2 Score 0 0 0 0  PHQ- 9 Score 0 0 1 -    GAD 7 : Generalized Anxiety Score 12/13/2021 11/29/2021 07/19/2021 02/14/2021  Nervous, Anxious, on Edge 0 0 0 0  Control/stop worrying 0 0 0 1  Worry too much - different things 0 0 0 1  Trouble relaxing 0 0 0 0  Restless 0 0 0 0   Easily annoyed or irritable 0 0 0 0  Afraid - awful might happen 0 0 0 0  Total GAD 7 Score 0 0 0 2  Anxiety Difficulty Not difficult at all Not difficult at all - -    BP Readings from Last 3 Encounters:  12/13/21 122/70  11/29/21 122/78  07/19/21 132/74    Physical Exam Vitals and nursing note reviewed.  Constitutional:      General: He is not in acute distress.    Appearance: Normal appearance. He is well-developed.  HENT:     Head: Normocephalic and atraumatic.  Cardiovascular:     Rate and Rhythm: Normal rate and regular rhythm.     Pulses:          Dorsalis pedis pulses are 1+ on the right side and 1+ on the left side.  Pulmonary:     Effort: Pulmonary effort is normal. No respiratory distress.     Breath sounds: Decreased air movement present. No wheezing or rhonchi.  Musculoskeletal:     Right foot: Bunion present.     Left foot: Bunion present.  Feet:     Right foot:     Skin integrity: No blister, skin breakdown or erythema.     Left foot:     Skin integrity: No blister, skin breakdown or erythema.  Skin:    General: Skin is warm and dry.     Findings: No rash.  Neurological:     Mental Status: He is alert and oriented to person, place, and time.  Psychiatric:        Mood and Affect: Mood normal.        Behavior: Behavior normal.    Wt Readings from Last 3 Encounters:  12/13/21 191 lb 9.6 oz (86.9 kg)  11/29/21 191 lb 9.6 oz (86.9 kg)  07/19/21 197 lb (89.4 kg)    BP 122/70    Pulse 97    Ht 5' 9"  (1.753 m)    Wt 191 lb 9.6 oz (86.9 kg)    SpO2 97%    BMI 28.29 kg/m   Assessment and Plan: 1. Polyneuropathy due to type 2  diabetes mellitus ([place]) Due to DM - A1C is improving and he is working for better control. Will begin gabapentin 100 mg at bedtime, gradually increase to 300 mg at bedtime - gabapentin (NEURONTIN) 100 MG capsule; Take 1-3 capsules (100-300 mg total) by mouth 3 (three) times daily.  Dispense: 90 capsule; Refill: 3  2. Chronic  GERD Breakthrough symptoms daily despite elevating the bed and taking pantoprazole bid. Will try Nexium bid instead. Will need to consider EGD if sx continue - esomeprazole (NEXIUM) 40 MG capsule; Take 1 capsule (40 mg total) by mouth 2 (two) times daily before a meal.  Dispense: 180 capsule; Refill: 1   Partially dictated using Editor, commissioning. Any errors are unintentional.  Halina Maidens, MD [facility]  12/13/2021

## 2021-12-13 NOTE — Telephone Encounter (Signed)
Pt returned call. States he would like to keep 10:00 appt to discuss other issues.

## 2021-12-13 NOTE — Telephone Encounter (Signed)
Called pt to see how he is doing. Pt if pt still has a cough the cough can linger one for weeks. If pt is worse we can still see him at his 10:00 AM appt today 12/15. If pt is better than he was he needs to give it a little more time. If pt is better please cancel 10:00 AM appt today 12/15.  PEC nurse may give results to patient if they return call to clinic, a CRM has been created.  KP

## 2021-12-24 ENCOUNTER — Other Ambulatory Visit: Payer: Self-pay | Admitting: Internal Medicine

## 2022-01-01 ENCOUNTER — Other Ambulatory Visit: Payer: Self-pay | Admitting: Internal Medicine

## 2022-01-01 DIAGNOSIS — I5022 Chronic systolic (congestive) heart failure: Secondary | ICD-10-CM

## 2022-01-02 NOTE — Telephone Encounter (Signed)
Requested Prescriptions  Pending Prescriptions Disp Refills   nitroGLYCERIN (NITROSTAT) 0.4 MG SL tablet [Pharmacy Med Name: NITROGLYCERIN 0.4MG  SUB TAB 25S] 25 tablet 1    Sig: DISSOLVE 1 TABLET UNDER THE TONGUE AS NEEDED FOR CHEST PAIN EVERY 5 MINUTES     Cardiovascular:  Nitrates Passed - 01/01/2022  4:56 PM      Passed - Last BP in normal range    BP Readings from Last 1 Encounters:  12/13/21 122/70         Passed - Last Heart Rate in normal range    Pulse Readings from Last 1 Encounters:  12/13/21 97         Passed - Valid encounter within last 12 months    Recent Outpatient Visits          2 weeks ago Polyneuropathy due to type 2 diabetes mellitus [facility])   [facility] [name], MD   1 month ago Acute exacerbation of chronic obstructive pulmonary disease (COPD) [facility])   [facility] [name], MD   5 months ago Type II diabetes mellitus with complication [facility])   [facility] [name], MD   8 months ago Cellulitis of right upper extremity   [facility] [name], MD   10 months ago Type II diabetes mellitus with complication [facility])   [facility] [name], MD      Future Appointments            In 2 months [name] Nyoka Cowden, MD [facility], [facility]

## 2022-01-14 ENCOUNTER — Other Ambulatory Visit: Payer: Self-pay | Admitting: Internal Medicine

## 2022-01-14 DIAGNOSIS — E118 Type 2 diabetes mellitus with unspecified complications: Secondary | ICD-10-CM

## 2022-01-14 MED ORDER — EMPAGLIFLOZIN 25 MG PO TABS: 25.0000 mg | ORAL_TABLET | Freq: Every day | ORAL | 1 refills | Status: DC

## 2022-01-18 ENCOUNTER — Telehealth: Payer: Self-pay | Admitting: Internal Medicine

## 2022-01-18 NOTE — Telephone Encounter (Signed)
Requested medication (s) are due for refill today: yes  Requested medication (s) are on the active medication list: yes  Last refill:  12/23/2020  Future visit scheduled: 03/05/22  Notes to clinic:  This med last filled over one year ago, please assess.  Requested Prescriptions  Pending Prescriptions Disp Refills   LANTUS SOLOSTAR 100 UNIT/ML Solostar Pen [Pharmacy Med Name: LANTUS SOLOSTAR PEN INJ 3ML] 15 mL 2    Sig: ADMINISTER 50 UNITS UNDER THE SKIN DAILY     Endocrinology:  Diabetes - Insulins Failed - 01/18/2022  8:02 PM      Failed - HBA1C is between 0 and 7.9 and within 180 days    Hemoglobin A1C  Date Value Ref Range Status  10/11/2014 6.9 (H) 4.2 - 6.3 % Final    Comment:    The American Diabetes Association recommends that a primary goal of therapy should be <7% and that physicians should reevaluate the treatment regimen in patients with HbA1c values consistently >8%.    Hgb A1c MFr Bld  Date Value Ref Range Status  11/29/2021 8.0 (H) 4.8 - 5.6 % Final    Comment:             Prediabetes: 5.7 - 6.4          Diabetes: >6.4          Glycemic control for adults with diabetes: <7.0           Passed - Valid encounter within last 6 months    Recent Outpatient Visits           1 month ago Polyneuropathy due to type 2 diabetes mellitus [facility])   [facility] [name], MD   1 month ago Acute exacerbation of chronic obstructive pulmonary disease (COPD) [facility])   [facility] [name], MD   6 months ago Type II diabetes mellitus with complication [facility])   [facility] [name], MD   9 months ago Cellulitis of right upper extremity   [facility] [name], MD   11 months ago Type II diabetes mellitus with complication [facility])   [facility] [name], MD       Future Appointments             In 1 month [name], Nyoka Cowden, MD [facility], [place]

## 2022-01-28 ENCOUNTER — Other Ambulatory Visit: Payer: Self-pay

## 2022-01-28 ENCOUNTER — Other Ambulatory Visit: Payer: Self-pay | Admitting: Internal Medicine

## 2022-01-28 MED ORDER — ALLOPURINOL 300 MG PO TABS: ORAL_TABLET | ORAL | 0 refills | Status: DC

## 2022-01-28 MED ORDER — GLIPIZIDE ER 10 MG PO TB24: ORAL_TABLET | ORAL | 0 refills | Status: DC

## 2022-01-28 MED ORDER — LANTUS SOLOSTAR 100 UNIT/ML ~~LOC~~ SOPN: 50.0000 [IU] | PEN_INJECTOR | Freq: Every day | SUBCUTANEOUS | 2 refills | Status: DC

## 2022-01-28 NOTE — Telephone Encounter (Signed)
Requested by interface surescripts. Receipt confirmed by pharmacy 01/28/22 at 1:21 pm.  Requested Prescriptions  Refused Prescriptions Disp Refills   allopurinol (ZYLOPRIM) 300 MG tablet [Pharmacy Med Name: ALLOPURINOL 300MG  TABLETS] 90 tablet 0    Sig: TAKE 1 TABLET(300 MG) BY MOUTH DAILY     Endocrinology:  Gout Agents Failed - 01/28/2022  9:23 AM      Failed - Uric Acid in normal range and within 360 days    Uric Acid  Date Value Ref Range Status  09/28/2020 3.5 (L) 3.8 - 8.4 mg/dL Final    Comment:               Therapeutic target for gout patients: <6.0         Failed - Cr in normal range and within 360 days    Creatinine  Date Value Ref Range Status  10/11/2014 1.19 0.60 - 1.30 mg/dL Final   Creatinine, Ser  Date Value Ref Range Status  11/29/2021 1.47 (H) 0.76 - 1.27 mg/dL Final         Passed - Valid encounter within last 12 months    Recent Outpatient Visits          1 month ago Polyneuropathy due to type 2 diabetes mellitus [facility])   [facility], MD   2 months ago Acute exacerbation of chronic obstructive pulmonary disease (COPD) [facility])   [facility], MD   6 months ago Type II diabetes mellitus with complication [facility])   [facility], MD   9 months ago Cellulitis of right upper extremity   [facility], MD   11 months ago Type II diabetes mellitus with complication [facility])   [facility], MD      Future Appointments            In 1 month [name], [name], MD [facility], [facility]

## 2022-01-28 NOTE — Telephone Encounter (Signed)
Copied from CRM [id number]. Topic: Quick Communication - Rx Refill/Question >> Jan 28, 2022  9:30 AM Jaquita Rector A wrote: Medication: torsemide (DEMADEX) 100 MG tablet, allopurinol (ZYLOPRIM) 300 MG tablet, insulin glargine (LANTUS SOLOSTAR) 100 UNIT/ML Solostar Pen per pharmacy they did not get the Rx sent on 01/21/22  Has the patient contacted their pharmacy? Yes.  Telling patient no Rx there  (Agent: If no, request that the patient contact the pharmacy for the refill. If patient does not wish to contact the pharmacy document the reason why and proceed with request.) (Agent: If yes, when and what did the pharmacy advise?)  Preferred Pharmacy (with phone number or street name): [id number] - [place], [place] - [address] [place] [address] AT [facility] [address] [facility] [address]  Phone:  [phone] Fax:  [phone]    Has the patient been seen for an appointment in the last year OR does the patient have an upcoming appointment? Yes.    Agent: Please be advised that RX refills may take up to 3 business days. We ask that you follow-up with your pharmacy.

## 2022-01-28 NOTE — Telephone Encounter (Signed)
All medication sent to pharmacy. Pt verbalized understanding.  KP

## 2022-01-28 NOTE — Telephone Encounter (Signed)
Pt called in stating the pharmacy has not received his Insulin medication, and he also stated he needs the glipiZIDE (GLUCOTROL XL) 10 MG 24 hr tablet refilled as well. Pt states he has been waiting for a while and really needs this medication, please advise.

## 2022-01-30 ENCOUNTER — Other Ambulatory Visit: Payer: Self-pay | Admitting: Internal Medicine

## 2022-01-30 NOTE — Telephone Encounter (Signed)
Pharmacy requested 90 supply   Requested Prescriptions  Pending Prescriptions Disp Refills   glipiZIDE (GLUCOTROL XL) 10 MG 24 hr tablet [Pharmacy Med Name: GLIPIZIDE ER 10MG  TABLETS] 90 tablet 0    Sig: TAKE 1 TABLET(10 MG) BY MOUTH DAILY     Endocrinology:  Diabetes - Sulfonylureas Failed - 01/30/2022  8:19 AM      Failed - HBA1C is between 0 and 7.9 and within 180 days    Hemoglobin A1C  Date Value Ref Range Status  10/11/2014 6.9 (H) 4.2 - 6.3 % Final    Comment:    The American Diabetes Association recommends that a primary goal of therapy should be <7% and that physicians should reevaluate the treatment regimen in patients with HbA1c values consistently >8%.    Hgb A1c MFr Bld  Date Value Ref Range Status  11/29/2021 8.0 (H) 4.8 - 5.6 % Final    Comment:             Prediabetes: 5.7 - 6.4          Diabetes: >6.4          Glycemic control for adults with diabetes: <7.0          Failed - Cr in normal range and within 360 days    Creatinine  Date Value Ref Range Status  10/11/2014 1.19 0.60 - 1.30 mg/dL Final   Creatinine, Ser  Date Value Ref Range Status  11/29/2021 1.47 (H) 0.76 - 1.27 mg/dL Final         Passed - Valid encounter within last 6 months    Recent Outpatient Visits          1 month ago Polyneuropathy due to type 2 diabetes mellitus [facility])   [facility], MD   2 months ago Acute exacerbation of chronic obstructive pulmonary disease (COPD) [facility])   [facility], MD   6 months ago Type II diabetes mellitus with complication [facility])   [facility], MD   9 months ago Cellulitis of right upper extremity   [facility], MD   11 months ago Type II diabetes mellitus with complication [facility])   [facility], MD      Future Appointments            In 1 month [name], [name], MD [facility], [facility]

## 2022-01-31 DIAGNOSIS — Z955 Presence of coronary angioplasty implant and graft: Secondary | ICD-10-CM | POA: Diagnosis not present

## 2022-01-31 DIAGNOSIS — I739 Peripheral vascular disease, unspecified: Secondary | ICD-10-CM | POA: Diagnosis not present

## 2022-01-31 DIAGNOSIS — R000 Tachycardia, unspecified: Secondary | ICD-10-CM | POA: Diagnosis not present

## 2022-01-31 DIAGNOSIS — I255 Ischemic cardiomyopathy: Secondary | ICD-10-CM | POA: Diagnosis not present

## 2022-01-31 DIAGNOSIS — I5022 Chronic systolic (congestive) heart failure: Secondary | ICD-10-CM | POA: Diagnosis not present

## 2022-01-31 DIAGNOSIS — R001 Bradycardia, unspecified: Secondary | ICD-10-CM | POA: Diagnosis not present

## 2022-01-31 DIAGNOSIS — I4891 Unspecified atrial fibrillation: Secondary | ICD-10-CM | POA: Diagnosis not present

## 2022-01-31 DIAGNOSIS — K219 Gastro-esophageal reflux disease without esophagitis: Secondary | ICD-10-CM | POA: Diagnosis not present

## 2022-01-31 DIAGNOSIS — I214 Non-ST elevation (NSTEMI) myocardial infarction: Secondary | ICD-10-CM | POA: Diagnosis not present

## 2022-01-31 DIAGNOSIS — R600 Localized edema: Secondary | ICD-10-CM | POA: Diagnosis not present

## 2022-01-31 DIAGNOSIS — I25810 Atherosclerosis of coronary artery bypass graft(s) without angina pectoris: Secondary | ICD-10-CM | POA: Diagnosis not present

## 2022-01-31 DIAGNOSIS — J449 Chronic obstructive pulmonary disease, unspecified: Secondary | ICD-10-CM | POA: Diagnosis not present

## 2022-02-02 ENCOUNTER — Other Ambulatory Visit: Payer: Self-pay | Admitting: Family Medicine

## 2022-02-02 NOTE — Telephone Encounter (Signed)
Refilled 01/30/2022 #90 Requested Prescriptions  Pending Prescriptions Disp Refills   glipiZIDE (GLUCOTROL XL) 10 MG 24 hr tablet [Pharmacy Med Name: GLIPIZIDE ER 10MG  TABLETS] 90 tablet 0    Sig: TAKE 1 TABLET(10 MG) BY MOUTH DAILY     Endocrinology:  Diabetes - Sulfonylureas Failed - 02/02/2022  6:23 AM      Failed - HBA1C is between 0 and 7.9 and within 180 days    Hemoglobin A1C  Date Value Ref Range Status  10/11/2014 6.9 (H) 4.2 - 6.3 % Final    Comment:    The American Diabetes Association recommends that a primary goal of therapy should be <7% and that physicians should reevaluate the treatment regimen in patients with HbA1c values consistently >8%.    Hgb A1c MFr Bld  Date Value Ref Range Status  11/29/2021 8.0 (H) 4.8 - 5.6 % Final    Comment:             Prediabetes: 5.7 - 6.4          Diabetes: >6.4          Glycemic control for adults with diabetes: <7.0          Failed - Cr in normal range and within 360 days    Creatinine  Date Value Ref Range Status  10/11/2014 1.19 0.60 - 1.30 mg/dL Final   Creatinine, Ser  Date Value Ref Range Status  11/29/2021 1.47 (H) 0.76 - 1.27 mg/dL Final         Passed - Valid encounter within last 6 months    Recent Outpatient Visits          1 month ago Polyneuropathy due to type 2 diabetes mellitus [facility])   [facility], MD   2 months ago Acute exacerbation of chronic obstructive pulmonary disease (COPD) [facility])   [facility], MD   6 months ago Type II diabetes mellitus with complication [facility])   [facility], MD   9 months ago Cellulitis of right upper extremity   [facility], MD   11 months ago Type II diabetes mellitus with complication [facility])   [facility], MD      Future Appointments            In 1 month [name], [name], MD [facility], [facility]

## 2022-02-04 ENCOUNTER — Other Ambulatory Visit: Payer: Self-pay

## 2022-02-04 ENCOUNTER — Other Ambulatory Visit: Payer: Self-pay | Admitting: Internal Medicine

## 2022-02-04 DIAGNOSIS — E118 Type 2 diabetes mellitus with unspecified complications: Secondary | ICD-10-CM

## 2022-02-04 MED ORDER — LEVEMIR FLEXPEN 100 UNIT/ML ~~LOC~~ SOPN: 50.0000 [IU] | PEN_INJECTOR | Freq: Every day | SUBCUTANEOUS | 5 refills | Status: DC

## 2022-02-04 MED ORDER — CLOPIDOGREL BISULFATE 75 MG PO TABS: ORAL_TABLET | ORAL | 1 refills | Status: DC

## 2022-02-10 ENCOUNTER — Other Ambulatory Visit: Payer: Self-pay | Admitting: Internal Medicine

## 2022-02-10 DIAGNOSIS — I5022 Chronic systolic (congestive) heart failure: Secondary | ICD-10-CM

## 2022-02-11 NOTE — Telephone Encounter (Signed)
Requested medication (s) are due for refill today: filled 02/02/22 for #25, unsure how often pt is needing to take  Requested medication (s) are on the active medication list: yes  Last refill:  01/02/22 #25 with 1 RF  Future visit scheduled: 03/05/22  Notes to clinic:  requesting early, rx for 25. Please assess.    Requested Prescriptions  Pending Prescriptions Disp Refills   nitroGLYCERIN (NITROSTAT) 0.4 MG SL tablet [Pharmacy Med Name: NITROGLYCERIN 0.4MG  SUB TAB 25S] 25 tablet 1    Sig: DISSOLVE 1 TABLET UNDER THE TONGUE AS NEEDED FOR CHEST PAIN EVERY 5 MINUTES     Cardiovascular:  Nitrates Passed - 02/10/2022 12:14 PM      Passed - Last BP in normal range    BP Readings from Last 1 Encounters:  12/13/21 122/70          Passed - Last Heart Rate in normal range    Pulse Readings from Last 1 Encounters:  12/13/21 97          Passed - Valid encounter within last 12 months    Recent Outpatient Visits           2 months ago Polyneuropathy due to type 2 diabetes mellitus [facility])   [facility] [name], MD   2 months ago Acute exacerbation of chronic obstructive pulmonary disease (COPD) [facility])   [facility] [name], MD   6 months ago Type II diabetes mellitus with complication [facility])   [facility] [name], MD   9 months ago Cellulitis of right upper extremity   [facility] [name], MD   12 months ago Type II diabetes mellitus with complication [facility])   [facility] [name], MD       Future Appointments             In 3 weeks [name] Nyoka Cowden, MD [facility], [facility]

## 2022-03-05 ENCOUNTER — Ambulatory Visit (INDEPENDENT_AMBULATORY_CARE_PROVIDER_SITE_OTHER): Payer: Medicare Other | Admitting: Internal Medicine

## 2022-03-05 ENCOUNTER — Encounter: Payer: Self-pay | Admitting: Internal Medicine

## 2022-03-05 ENCOUNTER — Other Ambulatory Visit: Payer: Self-pay

## 2022-03-05 VITALS — BP 108/74 | HR 82 | Ht 69.0 in | Wt 194.6 lb

## 2022-03-05 DIAGNOSIS — E118 Type 2 diabetes mellitus with unspecified complications: Secondary | ICD-10-CM | POA: Diagnosis not present

## 2022-03-05 DIAGNOSIS — I25119 Atherosclerotic heart disease of native coronary artery with unspecified angina pectoris: Secondary | ICD-10-CM | POA: Diagnosis not present

## 2022-03-05 DIAGNOSIS — E1169 Type 2 diabetes mellitus with other specified complication: Secondary | ICD-10-CM | POA: Diagnosis not present

## 2022-03-05 DIAGNOSIS — I5022 Chronic systolic (congestive) heart failure: Secondary | ICD-10-CM

## 2022-03-05 DIAGNOSIS — E785 Hyperlipidemia, unspecified: Secondary | ICD-10-CM

## 2022-03-05 DIAGNOSIS — I739 Peripheral vascular disease, unspecified: Secondary | ICD-10-CM | POA: Diagnosis not present

## 2022-03-05 DIAGNOSIS — M2550 Pain in unspecified joint: Secondary | ICD-10-CM
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
## 2022-03-06 LAB — BASIC METABOLIC PANEL
BUN/Creatinine Ratio: 25 — ABNORMAL HIGH (ref 10–24)
BUN: 38 mg/dL — ABNORMAL HIGH (ref 8–27)
CO2: 23 mmol/L (ref 20–29)
Calcium: 9.6 mg/dL (ref 8.6–10.2)
Chloride: 100 mmol/L (ref 96–106)
Creatinine, Ser: 1.53 mg/dL — ABNORMAL HIGH (ref 0.76–1.27)
Glucose: 192 mg/dL — ABNORMAL HIGH (ref 70–99)
Potassium: 4.7 mmol/L (ref 3.5–5.2)
Sodium: 140 mmol/L (ref 134–144)
eGFR: 48 mL/min/{1.73_m2} — ABNORMAL LOW (ref >59)

## 2022-03-06 LAB — HEMOGLOBIN A1C
Est. average glucose Bld gHb Est-mCnc: 180 mg/dL
Hgb A1c MFr Bld: 7.9 % — ABNORMAL HIGH (ref 4.8–5.6)

## 2022-03-06 LAB — SEDIMENTATION RATE: Sed Rate: 8 mm/hr (ref 0–30)

## 2022-03-06 LAB — ANA W/REFLEX IF POSITIVE: Anti Nuclear Antibody (ANA): NEGATIVE

## 2022-03-06 LAB — MICROALBUMIN / CREATININE URINE RATIO
Creatinine, Urine: 7.9 mg/dL
Microalbumin, Urine: <3 ug/mL

## 2022-03-06 LAB — RHEUMATOID FACTOR: Rheumatoid fact SerPl-aCnc: <10 IU/mL (ref <14.0)

## 2022-03-20 ENCOUNTER — Other Ambulatory Visit: Payer: Self-pay | Admitting: Internal Medicine

## 2022-03-20 DIAGNOSIS — M539 Dorsopathy, unspecified: Secondary | ICD-10-CM

## 2022-03-22 NOTE — Telephone Encounter (Signed)
Requested Prescriptions  ?Pending Prescriptions Disp Refills  ?? pantoprazole (PROTONIX) 40 MG tablet [Pharmacy Med Name: PANTOPRAZOLE 40MG TABLETS] 180 tablet 0  ?  Sig: TAKE 1 TABLET(40 MG) BY MOUTH TWICE DAILY  ?  ? Gastroenterology: Proton Pump Inhibitors Passed - 03/20/2022 11:17 AM  ?  ?  Passed - Valid encounter within last 12 months  ?  Recent Outpatient Visits   ?      ? 2 weeks ago Type II diabetes mellitus with complication ([place])  ? [facility] Glean Hess, MD  ? 3 months ago Polyneuropathy due to type 2 diabetes mellitus ([place])  ? [facility] Glean Hess, MD  ? 3 months ago Acute exacerbation of chronic obstructive pulmonary disease (COPD) ([place])  ? [facility] Glean Hess, MD  ? 8 months ago Type II diabetes mellitus with complication [facility])  ? [facility] Glean Hess, MD  ? 11 months ago Cellulitis of right upper extremity  ? [facility] Glean Hess, MD  ?  ?  ?Future Appointments   ?        ? In 3 months Army Melia Jesse Sans, MD [facility]  ?  ? ?  ?  ?  ?? ibuprofen (ADVIL) 800 MG tablet [Pharmacy Med Name: IBUPROFEN 800MG TABLETS] 30 tablet 2  ?  Sig: TAKE 1 TABLET(800 MG) BY MOUTH DAILY AS NEEDED  ?  ? Analgesics:  NSAIDS Failed - 03/20/2022 11:17 AM  ?  ?  Failed - Manual Review: Labs are only required if the patient has taken medication for more than 8 weeks.  ?  ?  Failed - Cr in normal range and within 360 days  ?  Creatinine  ?Date Value Ref Range Status  ?10/11/2014 1.19 0.60 - 1.30 mg/dL Final  ? ?Creatinine, Ser  ?Date Value Ref Range Status  ?03/05/2022 1.53 (H) 0.76 - 1.27 mg/dL Final  ?   ?  ?  Passed - HGB in normal range and within 360 days  ?  Hemoglobin  ?Date Value Ref Range Status  ?11/29/2021 13.9 13.0 - 17.7 g/dL Final  ?   ?  ?  Passed - PLT in normal range and within 360 days  ?  Platelets  ?Date Value Ref Range Status  ?11/29/2021 242 150 - 450 x10E3/uL Final  ?   ?  ?  Passed - HCT in  normal range and within 360 days  ?  Hematocrit  ?Date Value Ref Range Status  ?11/29/2021 40.7 37.5 - 51.0 % Final  ?   ?  ?  Passed - eGFR is 30 or above and within 360 days  ?  EGFR (African American)  ?Date Value Ref Range Status  ?10/11/2014 >60 >85m/min Final  ? ?GFR calc Af AWyvonnia Lora ?Date Value Ref Range Status  ?02/14/2021 54 (L) >59 mL/min/1.73 Final  ?  Comment:  ?  **In accordance with recommendations from the NKF-ASN Task force,** ?  [facility] is in the process of updating its eGFR calculation to the ?  2021 CKD-EPI creatinine equation that estimates kidney function ?  without a race variable. ?  ? ?EGFR (Non-African Amer.)  ?Date Value Ref Range Status  ?10/11/2014 >60 >639mmin Final  ?  Comment:  ?  eGFR values <6083min/1.73 m2 may be an indication of chronic ?kidney disease (CKD). ?Calculated eGFR, using the MRDR Study equation, is useful in  ?patients with stable renal function. ?The eGFR  calculation will not be reliable in acutely ill patients ?when serum creatinine is changing rapidly. It is not useful in ?patients on dialysis. The eGFR calculation may not be applicable ?to patients at the low and high extremes of body sizes, pregnant ?women, and vegetarians. ?  ? ?GFR calc non Af Amer  ?Date Value Ref Range Status  ?02/14/2021 47 (L) >59 mL/min/1.73 Final  ? ?eGFR  ?Date Value Ref Range Status  ?03/05/2022 48 (L) >59 mL/min/1.73 Final  ?   ?  ?  Passed - Patient is not pregnant  ?  ?  Passed - Valid encounter within last 12 months  ?  Recent Outpatient Visits   ?      ? 2 weeks ago Type II diabetes mellitus with complication ([place])  ? [facility] Glean Hess, MD  ? 3 months ago Polyneuropathy due to type 2 diabetes mellitus ([place])  ? [facility] Glean Hess, MD  ? 3 months ago Acute exacerbation of chronic obstructive pulmonary disease (COPD) ([place])  ? [facility] Glean Hess, MD  ? 8 months ago Type II diabetes mellitus with complication [facility])  ? [facility] Glean Hess, MD  ? 11 months ago Cellulitis of right upper extremity  ? [facility] Glean Hess, MD  ?  ?  ?Future Appointments   ?        ? In 3 months Army Melia Jesse Sans, MD [facility], [place]  ?  ? ?  ?  ?  ? ?

## 2022-03-22 NOTE — Telephone Encounter (Signed)
Requested medications are due for refill today.  unsure ? ?Requested medications are on the active medications list.  no ? ?Last refill. 08/17/2021 #180 0 ? ?Future visit scheduled.   yes ? ?Notes to clinic.  Medication was discontinued 12/13/2021. ? ? ? ?Requested Prescriptions  ?Pending Prescriptions Disp Refills  ? pantoprazole (PROTONIX) 40 MG tablet [Pharmacy Med Name: PANTOPRAZOLE 40MG TABLETS] 180 tablet 0  ?  Sig: TAKE 1 TABLET(40 MG) BY MOUTH TWICE DAILY  ?  ? Gastroenterology: Proton Pump Inhibitors Passed - 03/20/2022 11:17 AM  ?  ?  Passed - Valid encounter within last 12 months  ?  Recent Outpatient Visits   ? ?      ? 2 weeks ago Type II diabetes mellitus with complication ([place])  ? [facility] Glean Hess, MD  ? 3 months ago Polyneuropathy due to type 2 diabetes mellitus ([place])  ? [facility] Glean Hess, MD  ? 3 months ago Acute exacerbation of chronic obstructive pulmonary disease (COPD) ([place])  ? [facility] Glean Hess, MD  ? 8 months ago Type II diabetes mellitus with complication [facility])  ? [facility] Glean Hess, MD  ? 11 months ago Cellulitis of right upper extremity  ? [facility] Glean Hess, MD  ? ?  ?  ?Future Appointments   ? ?        ? In 3 months Army Melia Jesse Sans, MD [facility], [place]  ? ?  ? ?  ?  ?  ?Signed Prescriptions Disp Refills  ? ibuprofen (ADVIL) 800 MG tablet 30 tablet 2  ?  Sig: TAKE 1 TABLET(800 MG) BY MOUTH DAILY AS NEEDED  ?  ? Analgesics:  NSAIDS Failed - 03/20/2022 11:17 AM  ?  ?  Failed - Manual Review: Labs are only required if the patient has taken medication for more than 8 weeks.  ?  ?  Failed - Cr in normal range and within 360 days  ?  Creatinine  ?Date Value Ref Range Status  ?10/11/2014 1.19 0.60 - 1.30 mg/dL Final  ? ?Creatinine, Ser  ?Date Value Ref Range Status  ?03/05/2022 1.53 (H) 0.76 - 1.27 mg/dL Final  ?  ?  ?  ?  Passed - HGB in normal range and within 360 days  ?   Hemoglobin  ?Date Value Ref Range Status  ?11/29/2021 13.9 13.0 - 17.7 g/dL Final  ?  ?  ?  ?  Passed - PLT in normal range and within 360 days  ?  Platelets  ?Date Value Ref Range Status  ?11/29/2021 242 150 - 450 x10E3/uL Final  ?  ?  ?  ?  Passed - HCT in normal range and within 360 days  ?  Hematocrit  ?Date Value Ref Range Status  ?11/29/2021 40.7 37.5 - 51.0 % Final  ?  ?  ?  ?  Passed - eGFR is 30 or above and within 360 days  ?  EGFR (African American)  ?Date Value Ref Range Status  ?10/11/2014 >60 >37m/min Final  ? ?GFR calc Af AWyvonnia Lora ?Date Value Ref Range Status  ?02/14/2021 54 (L) >59 mL/min/1.73 Final  ?  Comment:  ?  **In accordance with recommendations from the NKF-ASN Task force,** ?  [facility] is in the process of updating its eGFR calculation to the ?  2021 CKD-EPI creatinine equation that estimates kidney function ?  without a race variable. ?  ? ?  EGFR (Non-African Amer.)  ?Date Value Ref Range Status  ?10/11/2014 >60 >26m/min Final  ?  Comment:  ?  eGFR values <687mmin/1.73 m2 may be an indication of chronic ?kidney disease (CKD). ?Calculated eGFR, using the MRDR Study equation, is useful in  ?patients with stable renal function. ?The eGFR calculation will not be reliable in acutely ill patients ?when serum creatinine is changing rapidly. It is not useful in ?patients on dialysis. The eGFR calculation may not be applicable ?to patients at the low and high extremes of body sizes, pregnant ?women, and vegetarians. ?  ? ?GFR calc non Af Amer  ?Date Value Ref Range Status  ?02/14/2021 47 (L) >59 mL/min/1.73 Final  ? ?eGFR  ?Date Value Ref Range Status  ?03/05/2022 48 (L) >59 mL/min/1.73 Final  ?  ?  ?  ?  Passed - Patient is not pregnant  ?  ?  Passed - Valid encounter within last 12 months  ?  Recent Outpatient Visits   ? ?      ? 2 weeks ago [place] diabetes mellitus with complication ([place] [facility]  ? 3 months ago [place] due to type 2 diabetes mellitus  ([place] ? [facility]  ? 3 months ago Acute exacerbation of chronic obstructive pulmonary disease (COPD) ([place] ? [facility]  ? 8 months [facility]  ? 11 months ago Cellulitis of right upper extremity  ? [facility]  ? ?  ?  ?Future Appointments   ? ?        ? In 3 months BeArmy MeliaaJesse SansMD [facility] [place]? ?  ? ?  ?  ?  ?  ?

## 2022-04-02 DIAGNOSIS — H2513 Age-related nuclear cataract, bilateral: Secondary | ICD-10-CM | POA: Diagnosis not present

## 2022-04-02 LAB — HM DIABETES EYE EXAM

## 2022-04-24 ENCOUNTER — Ambulatory Visit (INDEPENDENT_AMBULATORY_CARE_PROVIDER_SITE_OTHER): Payer: Medicare Other

## 2022-04-24 VITALS — BP 118/74 | HR 89 | Resp 16 | Ht 69.0 in | Wt 191.4 lb

## 2022-04-24 DIAGNOSIS — Z0000 Encounter for general adult medical examination without abnormal findings: Secondary | ICD-10-CM

## 2022-04-24 NOTE — Progress Notes (Signed)
? ?Subjective:  ? Gary Gutierrez is a 71 y.o. male who presents for Medicare Annual/Subsequent preventive examination. ? ?Review of Systems    ? ?Cardiac Risk Factors include: advanced age (>56men, >81 women);diabetes mellitus;dyslipidemia;male gender;hypertension ? ?   ?Objective:  ?  ?Today's Vitals  ? 04/24/22 0827 04/24/22 0829  ?BP: 118/74   ?Pulse: 89   ?Resp: 16   ?SpO2: 97%   ?Weight: 191 lb 6.4 oz (86.8 kg)   ?Height: 5\' 9"  (1.753 m)   ?PainSc:  5   ? ?Body mass index is 28.26 kg/m?. ? ? ?  04/24/2022  ?  8:37 AM 04/23/2021  ?  9:13 AM 04/19/2020  ?  9:02 AM 11/19/2019  ?  4:30 AM 11/17/2019  ? 11:40 AM 06/05/2019  ?  1:50 PM 06/05/2019  ? 12:45 PM  ?Advanced Directives  ?Does Patient Have a Medical Advance Directive? No No No No No No No  ?Would patient like information on creating a medical advance directive? No - Patient declined No - Patient declined Yes (MAU/Ambulatory/Procedural Areas - Information given) No - Patient declined  No - Patient declined No - Patient declined  ? ? ?Current Medications (verified) ?Outpatient Encounter Medications as of 04/24/2022  ?Medication Sig  ? albuterol (VENTOLIN HFA) 108 (90 Base) MCG/ACT inhaler Inhale 2 puffs into the lungs every 6 (six) hours as needed for wheezing or shortness of breath.  ? allopurinol (ZYLOPRIM) 300 MG tablet TAKE 1 TABLET(300 MG) BY MOUTH DAILY Strength: 300 mg  ? Ascorbic Acid (VITAMIN C) 1000 MG tablet Take 1,000 mg by mouth daily.  ? atorvastatin (LIPITOR) 80 MG tablet TAKE 1 TABLET(80 MG) BY MOUTH AT BEDTIME (Patient taking differently: Take 80 mg by mouth at bedtime.)  ? budesonide-formoterol (SYMBICORT) 160-4.5 MCG/ACT inhaler Inhale 1 puff into the lungs 2 (two) times daily. (Patient taking differently: Inhale 1 puff into the lungs as needed.)  ? carvedilol (COREG) 6.25 MG tablet Take 6.25 mg by mouth 2 (two) times daily with a meal.  ? Cholecalciferol (VITAMIN D3 PO) Take by mouth.  ? clopidogrel (PLAVIX) 75 MG tablet TAKE 1 TABLET(75 MG)  BY MOUTH DAILY  ? esomeprazole (NEXIUM) 40 MG capsule Take 1 capsule (40 mg total) by mouth 2 (two) times daily before a meal.  ? gabapentin (NEURONTIN) 100 MG capsule Take 1-3 capsules (100-300 mg total) by mouth 3 (three) times daily.  ? glipiZIDE (GLUCOTROL XL) 10 MG 24 hr tablet TAKE 1 TABLET(10 MG) BY MOUTH DAILY  ? glucose blood (ACCU-CHEK AVIVA PLUS) test strip TEST BLOOD SUGAR EVERY DAY AS DIRECTED UP TO 4 TIMES DAILY  ? ibuprofen (ADVIL) 800 MG tablet TAKE 1 TABLET(800 MG) BY MOUTH DAILY AS NEEDED  ? insulin detemir (LEVEMIR FLEXPEN) 100 UNIT/ML FlexPen Inject 50 Units into the skin daily.  ? Insulin Pen Needle (PEN NEEDLES 31GX5/16") 31G X 8 MM MISC 1 each by Does not apply route daily.  ? isosorbide mononitrate (IMDUR) 60 MG 24 hr tablet Take 60 mg by mouth daily.  ? linagliptin (TRADJENTA) 5 MG TABS tablet Take 1 tablet (5 mg total) by mouth daily.  ? losartan (COZAAR) 50 MG tablet Take 50 mg by mouth daily.  ? nitroGLYCERIN (NITROSTAT) 0.4 MG SL tablet DISSOLVE 1 TABLET UNDER THE TONGUE AS NEEDED FOR CHEST PAIN EVERY 5 MINUTES  ? pantoprazole (PROTONIX) 40 MG tablet TAKE 1 TABLET(40 MG) BY MOUTH TWICE DAILY  ? potassium chloride SA (K-DUR,KLOR-CON) 20 MEQ tablet TAKE 2 TABLETS(40 MEQ) BY MOUTH  DAILY (Patient taking differently: Take 40 mEq by mouth daily.)  ? torsemide (DEMADEX) 100 MG tablet Take by mouth 2 (two) times daily.   ? zinc gluconate 50 MG tablet Take 50 mg by mouth daily.  ? ?No facility-administered encounter medications on file as of 04/24/2022.  ? ? ?Allergies (verified) ?Entresto [sacubitril-valsartan] and Penicillins  ? ?History: ?Past Medical History:  ?Diagnosis Date  ? Acute respiratory failure due to COVID-19 [facility]) 11/19/2019  ? Allergy   ? CHF (congestive heart failure) (HCC)   ? Coronary artery disease   ? Diabetes mellitus without complication (HCC)   ? Hyperlipidemia   ? Hypertension   ? Myocardial infarction [facility])   ? Pneumonia due to COVID-19 virus 11/19/2019  ? ?Past Surgical  History:  ?Procedure Laterality Date  ? APPENDECTOMY    ? colonscopy  2010  ? benign polyps  ? CORONARY ARTERY BYPASS GRAFT  1995  ? CORONARY STENT INTERVENTION N/A 02/03/2017  ? Procedure: Coronary Stent Intervention;  Surgeon: Alwyn Pea, MD;  Location: [facility] INVASIVE CV LAB;  Service: Cardiovascular;  Laterality: N/A;  ? ESOPHAGOGASTRODUODENOSCOPY  2010  ? normal  ? LEFT HEART CATH AND CORONARY ANGIOGRAPHY N/A 02/03/2017  ? Procedure: Left Heart Cath and Coronary Angiography;  Surgeon: Alwyn Pea, MD;  Location: [facility] INVASIVE CV LAB;  Service: Cardiovascular;  Laterality: N/A;  ? PERCUTANEOUS CORONARY STENT INTERVENTION (PCI-S)  2010, 2015  ? 4 stents total  ? ?Family History  ?Problem Relation Age of Onset  ? Heart disease Father   ? Lung cancer Brother   ? Prostate cancer Brother   ? Diabetes Mother   ? Heart disease Mother   ? Heart attack Mother   ? Congestive Heart Failure Mother   ? ?Social History  ? ?Socioeconomic History  ? Marital status: Married  ?  Spouse name: Not on file  ? Number of children: 8  ? Years of education: Not on file  ? Highest education level: 11th grade  ?Occupational History  ? Occupation: Retired  ?Tobacco Use  ? Smoking status: Former  ?  Packs/day: 1.50  ?  Years: 29.00  ?  Pack years: 43.50  ?  Types: Cigarettes  ?  Quit date: 12/30/1994  ?  Years since quitting: 27.3  ? Smokeless tobacco: Never  ? Tobacco comments:  ?  smoking cessation materials not required  ?Vaping Use  ? Vaping Use: Never used  ?Substance and Sexual Activity  ? Alcohol use: No  ?  Alcohol/week: 0.0 standard drinks  ? Drug use: No  ? Sexual activity: Not Currently  ?Other Topics Concern  ? Not on file  ?Social History Narrative  ? Not on file  ? ?Social Determinants of Health  ? ?Financial Resource Strain: Low Risk   ? Difficulty of Paying Living Expenses: Not very hard  ?Food Insecurity: No Food Insecurity  ? Worried About Programme researcher, broadcasting/film/video in the Last Year: Never true  ? Ran Out of Food in the Last  Year: Never true  ?Transportation Needs: No Transportation Needs  ? Lack of Transportation (Medical): No  ? Lack of Transportation (Non-Medical): No  ?Physical Activity: Inactive  ? Days of Exercise per Week: 0 days  ? Minutes of Exercise per Session: 0 min  ?Stress: No Stress Concern Present  ? Feeling of Stress : Not at all  ?Social Connections: Moderately Integrated  ? Frequency of Communication with Friends and Family: More than three times a week  ? Frequency  of Social Gatherings with Friends and Family: More than three times a week  ? Attends Religious Services: More than 4 times per year  ? Active Member of Clubs or Organizations: No  ? Attends Archivist Meetings: Never  ? Marital Status: Married  ? ? ?Tobacco Counseling ?Counseling given: Not Answered ?Tobacco comments: smoking cessation materials not required ? ? ?Clinical Intake: ? ?Pre-visit preparation completed: Yes ? ?Pain : 0-10 ?Pain Score: 5  ?Pain Type: Chronic pain ?Pain Location: Back ?Pain Orientation: Lower ?Pain Descriptors / Indicators: Aching, Sore ?Pain Onset: More than a month ago ?Pain Frequency: Constant ? ?  ? ?BMI - recorded: 28.26 ?Nutritional Status: BMI 25 -29 Overweight ?Nutritional Risks: None ?Diabetes: Yes ?CBG done?: No ?Did pt. bring in CBG monitor from home?: No ? ?How often do you need to have someone help you when you read instructions, pamphlets, or other written materials from your doctor or pharmacy?: 1 - Never ? ?Nutrition Risk Assessment: ? ?Has the patient had any N/V/D within the last 2 months?  No  ?Does the patient have any non-healing wounds?  No  ?Has the patient had any unintentional weight loss or weight gain?  No  ? ?Diabetes: ? ?Is the patient diabetic?  Yes  ?If diabetic, was a CBG obtained today?  No  ?Did the patient bring in their glucometer from home?  No  ?How often do you monitor your CBG's? twice.  ? ?Financial Strains and Diabetes Management: ? ?Are you having any financial strains with  the device, your supplies or your medication? No .  ?Does the patient want to be seen by Chronic Care Management for management of their diabetes?  No  ?Would the patient like to be referred to a Nutri

## 2022-04-24 NOTE — Patient Instructions (Signed)
Gary Gutierrez , ?Thank you for taking time to come for your Medicare Wellness Visit. I appreciate your ongoing commitment to your health goals. Please review the following plan we discussed and let me know if I can assist you in the future.  ? ?Screening recommendations/referrals: ?Colonoscopy: done 06/18/18. Repeat 05/2023 ?Recommended yearly ophthalmology/optometry visit for glaucoma screening and checkup ?Recommended yearly dental visit for hygiene and checkup ? ?Vaccinations: ?Influenza vaccine: due fall 2023 ?Pneumococcal vaccine: done 06/10/17 ?Tdap vaccine: done 04/23/21 ?Shingles vaccine: Shingrix discussed. Please contact your pharmacy for coverage information.  ?Covid-19: declined ? ?Advanced directives: Advance directive discussed with you today. Even though you declined this today please call our office should you change your mind and we can give you the proper paperwork for you to fill out.  ? ?Conditions/risks identified:  ? ?Free hearing clinics offered in [place]:  ? ?[facility] ?[address], [place], [place] [address] ?[phone] ? ?[facility] ?[address] [place], [place] [address] ?[phone] ? ?Next appointment: Follow up in one year for your annual wellness visit.  ? ?Preventive Care 10 Years and Older, Male ?Preventive care refers to lifestyle choices and visits with your health care provider that can promote health and wellness. ?What does preventive care include? ?A yearly physical exam. This is also called an annual well check. ?Dental exams once or twice a year. ?Routine eye exams. Ask your health care provider how often you should have your eyes checked. ?Personal lifestyle choices, including: ?Daily care of your teeth and gums. ?Regular physical activity. ?Eating a healthy diet. ?Avoiding tobacco and drug use. ?Limiting alcohol use. ?Practicing safe sex. ?Taking low doses of aspirin every day. ?Taking vitamin and mineral supplements as  recommended by your health care provider. ?What happens during an annual well check? ?The services and screenings done by your health care provider during your annual well check will depend on your age, overall health, lifestyle risk factors, and family history of disease. ?Counseling  ?Your health care provider may ask you questions about your: ?Alcohol use. ?Tobacco use. ?Drug use. ?Emotional well-being. ?Home and relationship well-being. ?Sexual activity. ?Eating habits. ?History of falls. ?Memory and ability to understand (cognition). ?Work and work Statistician. ?Screening  ?You may have the following tests or measurements: ?Height, weight, and BMI. ?Blood pressure. ?Lipid and cholesterol levels. These may be checked every 5 years, or more frequently if you are over 70 years old. ?Skin check. ?Lung cancer screening. You may have this screening every year starting at age 15 if you have a 30-pack-year history of smoking and currently smoke or have quit within the past 15 years. ?Fecal occult blood test (FOBT) of the stool. You may have this test every year starting at age 24. ?Flexible sigmoidoscopy or colonoscopy. You may have a sigmoidoscopy every 5 years or a colonoscopy every 10 years starting at age 80. ?Prostate cancer screening. Recommendations will vary depending on your family history and other risks. ?Hepatitis C blood test. ?Hepatitis B blood test. ?Sexually transmitted disease (STD) testing. ?Diabetes screening. This is done by checking your blood sugar (glucose) after you have not eaten for a while (fasting). You may have this done every 1-3 years. ?Abdominal aortic aneurysm (AAA) screening. You may need this if you are a current or former smoker. ?Osteoporosis. You may be screened starting at age 79 if you are at high risk. ?Talk with your health care provider about your test results, treatment options, and if necessary,  the need for more tests. ?Vaccines  ?Your health care provider may recommend  certain vaccines, such as: ?Influenza vaccine. This is recommended every year. ?Tetanus, diphtheria, and acellular pertussis (Tdap, Td) vaccine. You may need a Td booster every 10 years. ?Zoster vaccine. You may need this after age 96. ?Pneumococcal 13-valent conjugate (PCV13) vaccine. One dose is recommended after age 83. ?Pneumococcal polysaccharide (PPSV23) vaccine. One dose is recommended after age 52. ?Talk to your health care provider about which screenings and vaccines you need and how often you need them. ?This information is not intended to replace advice given to you by your health care provider. Make sure you discuss any questions you have with your health care provider. ?Document Released: 01/12/2016 Document Revised: 09/04/2016 Document Reviewed: 10/17/2015 ?Elsevier Interactive Patient Education ? 2017 [place]. ? ?Fall Prevention in the Home ?Falls can cause injuries. They can happen to people of all ages. There are many things you can do to make your home safe and to help prevent falls. ?What can I do on the outside of my home? ?Regularly fix the edges of walkways and driveways and fix any cracks. ?Remove anything that might make you trip as you walk through a door, such as a raised step or threshold. ?Trim any bushes or trees on the path to your home. ?Use bright outdoor lighting. ?Clear any walking paths of anything that might make someone trip, such as rocks or tools. ?Regularly check to see if handrails are loose or broken. Make sure that both sides of any steps have handrails. ?Any raised decks and porches should have guardrails on the edges. ?Have any leaves, snow, or ice cleared regularly. ?Use sand or salt on walking paths during winter. ?Clean up any spills in your garage right away. This includes oil or grease spills. ?What can I do in the bathroom? ?Use night lights. ?Install grab bars by the toilet and in the tub and shower. Do not use towel bars as grab bars. ?Use non-skid mats or  decals in the tub or shower. ?If you need to sit down in the shower, use a plastic, non-slip stool. ?Keep the floor dry. Clean up any water that spills on the floor as soon as it happens. ?Remove soap buildup in the tub or shower regularly. ?Attach bath mats securely with double-sided non-slip rug tape. ?Do not have throw rugs and other things on the floor that can make you trip. ?What can I do in the bedroom? ?Use night lights. ?Make sure that you have a light by your bed that is easy to reach. ?Do not use any sheets or blankets that are too big for your bed. They should not hang down onto the floor. ?Have a firm chair that has side arms. You can use this for support while you get dressed. ?Do not have throw rugs and other things on the floor that can make you trip. ?What can I do in the kitchen? ?Clean up any spills right away. ?Avoid walking on wet floors. ?Keep items that you use a lot in easy-to-reach places. ?If you need to reach something above you, use a strong step stool that has a grab bar. ?Keep electrical cords out of the way. ?Do not use floor polish or wax that makes floors slippery. If you must use wax, use non-skid floor wax. ?Do not have throw rugs and other things on the floor that can make you trip. ?What can I do with my stairs? ?Do not leave any items on  the stairs. ?Make sure that there are handrails on both sides of the stairs and use them. Fix handrails that are broken or loose. Make sure that handrails are as long as the stairways. ?Check any carpeting to make sure that it is firmly attached to the stairs. Fix any carpet that is loose or worn. ?Avoid having throw rugs at the top or bottom of the stairs. If you do have throw rugs, attach them to the floor with carpet tape. ?Make sure that you have a light switch at the top of the stairs and the bottom of the stairs. If you do not have them, ask someone to add them for you. ?What else can I do to help prevent falls? ?Wear shoes that: ?Do not  have high heels. ?Have rubber bottoms. ?Are comfortable and fit you well. ?Are closed at the toe. Do not wear sandals. ?If you use a stepladder: ?Make sure that it is fully opened. Do not climb a closed stepladder.

## 2022-05-04 ENCOUNTER — Other Ambulatory Visit: Payer: Self-pay | Admitting: Internal Medicine

## 2022-05-06 NOTE — Telephone Encounter (Signed)
Requested Prescriptions  ?Pending Prescriptions Disp Refills  ?? allopurinol (ZYLOPRIM) 300 MG tablet [Pharmacy Med Name: ALLOPURINOL 300MG  TABLETS] 90 tablet 0  ?  Sig: TAKE 1 TABLET BY MOUTH DAILY  ?  ? Endocrinology:  Gout Agents - allopurinol Failed - 05/04/2022  7:24 AM  ?  ?  Failed - Uric Acid in normal range and within 360 days  ?  Uric Acid  ?Date Value Ref Range Status  ?09/28/2020 3.5 (L) 3.8 - 8.4 mg/dL Final  ?  Comment:  ?             Therapeutic target for gout patients: <6.0  ?   ?  ?  Failed - Cr in normal range and within 360 days  ?  Creatinine  ?Date Value Ref Range Status  ?10/11/2014 1.19 0.60 - 1.30 mg/dL Final  ? ?Creatinine, Ser  ?Date Value Ref Range Status  ?03/05/2022 1.53 (H) 0.76 - 1.27 mg/dL Final  ?   ?  ?  Passed - Valid encounter within last 12 months  ?  Recent Outpatient Visits   ?      ? 2 months ago Type II diabetes mellitus with complication ([place])  ? [facility] Glean Hess, MD  ? 4 months ago Polyneuropathy due to type 2 diabetes mellitus ([place])  ? [facility] Glean Hess, MD  ? 5 months ago Acute exacerbation of chronic obstructive pulmonary disease (COPD) ([place])  ? [facility] Glean [facility], MD  ? 9 months ago Type II diabetes mellitus with complication [facility])  ? [facility] Glean Hess, MD  ? 1 year ago Cellulitis of right upper extremity  ? [facility] Glean Hess, MD  ?  ?  ?Future Appointments   ?        ? In 2 months Glean Hess, MD [facility]  ?  ? ?  ?  ?  Passed - CBC within normal limits and completed in the last 12 months  ?  WBC  ?Date Value Ref Range Status  ?11/29/2021 12.9 (H) 3.4 - 10.8 x10E3/uL Final  ?11/20/2019 8.5 4.0 - 10.5 K/uL Final  ? ?RBC  ?Date Value Ref Range Status  ?11/29/2021 4.47 4.14 - 5.80 x10E6/uL Final  ?11/20/2019 4.75 4.22 - 5.81 MIL/uL Final  ? ?Hemoglobin  ?Date Value Ref Range Status  ?11/29/2021 13.9 13.0 - 17.7 g/dL Final  ? ?Hematocrit   ?Date Value Ref Range Status  ?11/29/2021 40.7 37.5 - 51.0 % Final  ? ?MCHC  ?Date Value Ref Range Status  ?11/29/2021 34.2 31.5 - 35.7 g/dL Final  ?11/20/2019 33.2 30.0 - 36.0 g/dL Final  ? ?MCH  ?Date Value Ref Range Status  ?11/29/2021 31.1 26.6 - 33.0 pg Final  ?11/20/2019 30.7 26.0 - 34.0 pg Final  ? ?MCV  ?Date Value Ref Range Status  ?11/29/2021 91 79 - 97 fL Final  ?10/10/2014 96 80 - 100 fL Final  ? ?No results found for: PLTCOUNTKUC, LABPLAT, [place] ?RDW  ?Date Value Ref Range Status  ?11/29/2021 12.8 11.6 - 15.4 % Final  ?10/10/2014 14.2 11.5 - 14.5 % Final  ? ?  ?  ?  ? ?

## 2022-05-07 ENCOUNTER — Encounter: Payer: Self-pay | Admitting: Internal Medicine

## 2022-05-07 DIAGNOSIS — E113299 Type 2 diabetes mellitus with mild nonproliferative diabetic retinopathy without macular edema, unspecified eye: Secondary | ICD-10-CM | POA: Insufficient documentation

## 2022-05-16 ENCOUNTER — Other Ambulatory Visit: Payer: Self-pay | Admitting: Internal Medicine

## 2022-05-17 NOTE — Telephone Encounter (Signed)
Requested Prescriptions  Pending Prescriptions Disp Refills  . glipiZIDE (GLUCOTROL XL) 10 MG 24 hr tablet [Pharmacy Med Name: GLIPIZIDE ER 10MG  TABLETS] 90 tablet 0    Sig: TAKE 1 TABLET(10 MG) BY MOUTH DAILY     Endocrinology:  Diabetes - Sulfonylureas Failed - 05/16/2022  7:39 PM      Failed - Cr in normal range and within 360 days    Creatinine  Date Value Ref Range Status  10/11/2014 1.19 0.60 - 1.30 mg/dL Final   Creatinine, Ser  Date Value Ref Range Status  03/05/2022 1.53 (H) 0.76 - 1.27 mg/dL Final         Passed - HBA1C is between 0 and 7.9 and within 180 days    Hemoglobin A1C  Date Value Ref Range Status  10/11/2014 6.9 (H) 4.2 - 6.3 % Final    Comment:    The American Diabetes Association recommends that a primary goal of therapy should be <7% and that physicians should reevaluate the treatment regimen in patients with HbA1c values consistently >8%.    Hgb A1c MFr Bld  Date Value Ref Range Status  03/05/2022 7.9 (H) 4.8 - 5.6 % Final    Comment:             Prediabetes: 5.7 - 6.4          Diabetes: >6.4          Glycemic control for adults with diabetes: <7.0          Passed - Valid encounter within last 6 months    Recent Outpatient Visits          2 months ago Type II diabetes mellitus with complication [facility])   [facility], MD   5 months ago Polyneuropathy due to type 2 diabetes mellitus [facility])   [facility], MD   5 months ago Acute exacerbation of chronic obstructive pulmonary disease (COPD) [facility])   [facility], MD   10 months ago Type II diabetes mellitus with complication [facility])   [facility], MD   1 year ago Cellulitis of right upper extremity   [facility] [name], MD      Future Appointments            In 2 months [name] [name], MD [facility], [facility]

## 2022-06-13 ENCOUNTER — Other Ambulatory Visit: Payer: Self-pay | Admitting: Internal Medicine

## 2022-06-13 ENCOUNTER — Telehealth: Payer: Self-pay | Admitting: Internal Medicine

## 2022-06-13 ENCOUNTER — Other Ambulatory Visit: Payer: Self-pay

## 2022-06-13 DIAGNOSIS — E118 Type 2 diabetes mellitus with unspecified complications: Secondary | ICD-10-CM

## 2022-06-13 MED ORDER — LINAGLIPTIN 5 MG PO TABS: 5.0000 mg | ORAL_TABLET | Freq: Every day | ORAL | 0 refills | Status: DC

## 2022-06-13 NOTE — Telephone Encounter (Signed)
Resending as pharmacy had system issues Requested Prescriptions  Pending Prescriptions Disp Refills  . linagliptin (TRADJENTA) 5 MG TABS tablet 90 tablet 0    Sig: Take 1 tablet (5 mg total) by mouth daily.     Endocrinology:  Diabetes - DPP-4 Inhibitors - linagliptin Passed - 06/13/2022 12:46 PM      Passed - HBA1C is between 0 and 7.9 and within 180 days    Hemoglobin A1C  Date Value Ref Range Status  10/11/2014 6.9 (H) 4.2 - 6.3 % Final    Comment:    The American Diabetes Association recommends that a primary goal of therapy should be <7% and that physicians should reevaluate the treatment regimen in patients with HbA1c values consistently >8%.    Hgb A1c MFr Bld  Date Value Ref Range Status  03/05/2022 7.9 (H) 4.8 - 5.6 % Final    Comment:             Prediabetes: 5.7 - 6.4          Diabetes: >6.4          Glycemic control for adults with diabetes: <7.0          Passed - Valid encounter within last 6 months    Recent Outpatient Visits          3 months ago Type II diabetes mellitus with complication [facility])   [facility] [name], MD   6 months ago Polyneuropathy due to type 2 diabetes mellitus [facility])   [facility] [name], MD   6 months ago Acute exacerbation of chronic obstructive pulmonary disease (COPD) [facility])   [facility] [name], MD   10 months ago Type II diabetes mellitus with complication [facility])   [facility] [name], MD   1 year ago Cellulitis of right upper extremity   [facility] [name], MD      Future Appointments            In 1 month [name], Nyoka Cowden, MD [facility], [facility]

## 2022-06-13 NOTE — Telephone Encounter (Signed)
Duplicate request. Requested Prescriptions  Pending Prescriptions Disp Refills  . TRADJENTA 5 MG TABS tablet [Pharmacy Med Name: TRADJENTA 5MG  TABLETS] 90 tablet 0    Sig: TAKE 1 TABLET(5 MG) BY MOUTH DAILY     Endocrinology:  Diabetes - DPP-4 Inhibitors - linagliptin Passed - 06/13/2022 11:46 AM      Passed - HBA1C is between 0 and 7.9 and within 180 days    Hemoglobin A1C  Date Value Ref Range Status  10/11/2014 6.9 (H) 4.2 - 6.3 % Final    Comment:    The American Diabetes Association recommends that a primary goal of therapy should be <7% and that physicians should reevaluate the treatment regimen in patients with HbA1c values consistently >8%.    Hgb A1c MFr Bld  Date Value Ref Range Status  03/05/2022 7.9 (H) 4.8 - 5.6 % Final    Comment:             Prediabetes: 5.7 - 6.4          Diabetes: >6.4          Glycemic control for adults with diabetes: <7.0          Passed - Valid encounter within last 6 months    Recent Outpatient Visits          3 months ago Type II diabetes mellitus with complication [facility])   [facility], MD   6 months ago Polyneuropathy due to type 2 diabetes mellitus [facility])   [facility], MD   6 months ago Acute exacerbation of chronic obstructive pulmonary disease (COPD) [facility])   [facility], MD   10 months ago Type II diabetes mellitus with complication [facility])   [facility], MD   1 year ago Cellulitis of right upper extremity   [facility] [name], MD      Future Appointments            In 1 month [name], [name], MD [facility], [facility]

## 2022-06-13 NOTE — Telephone Encounter (Signed)
Copied from CRM [id number]. Topic: General - Other >> Jun 13, 2022 12:22 PM Everette C wrote: Reason for CRM: Medication Refill - Medication: linagliptin (TRADJENTA) 5 MG TABS tablet [[id number]]   Has the patient contacted their pharmacy? Yes.  The patient's pharmacy has made contact on their behalf. A system error deleted the refill.  (Agent: If no, request that the patient contact the pharmacy for the refill. If patient does not wish to contact the pharmacy document the reason why and proceed with request.) (Agent: If yes, when and what did the pharmacy advise?)  Preferred Pharmacy (with phone number or street name): [id number], [place] - [address] AT [facility] OF [address] [place] [address] Phone: [phone] Fax: [phone] Hours: Not open 24 hours   Has the patient been seen for an appointment in the last year OR does the patient have an upcoming appointment? Yes.    Agent: Please be advised that RX refills may take up to 3 business days. We ask that you follow-up with your pharmacy.

## 2022-06-13 NOTE — Telephone Encounter (Signed)
Called Pts pharmacy was on hold for more than 10 mins. Will call back again later.  KP

## 2022-06-13 NOTE — Telephone Encounter (Signed)
Pt called in for  assistance. Pt says that he has been speaking with his pharmacy to get a refill for his medication linagliptin (TRADJENTA) 5 MG TABS tablet.  Pt says that he keep being told that they are unable to fill. Pt says that the pharmacy has him taking 1/2 pill daily. Pt says that he is prescribed per PCP to take 1 pill daily.   Pt would like further assistance.   Pharmacy:  [id number] [facility] [place] - [address] AT [facility] OF [address] [facility] [address] Phone:  [phone]  Fax:  [phone]

## 2022-06-13 NOTE — Telephone Encounter (Signed)
Spoke to pharmacy got pts medication corrected to once a day. Called pt let him know that the pharmacy is getting his prescription ready. Pt verbalized understanding.  KP

## 2022-07-13 ENCOUNTER — Other Ambulatory Visit: Payer: Self-pay | Admitting: Internal Medicine

## 2022-07-13 DIAGNOSIS — E118 Type 2 diabetes mellitus with unspecified complications: Secondary | ICD-10-CM

## 2022-07-15 NOTE — Telephone Encounter (Signed)
Requested Prescriptions  Pending Prescriptions Disp Refills  . JARDIANCE 25 MG TABS tablet [Pharmacy Med Name: JARDIANCE 25MG TABLETS] 90 tablet 0    Sig: TAKE 1 TABLET(25 MG) BY MOUTH DAILY     Endocrinology:  Diabetes - SGLT2 Inhibitors Failed - 07/13/2022  6:21 AM      Failed - Cr in normal range and within 360 days    Creatinine  Date Value Ref Range Status  10/11/2014 1.19 0.60 - 1.30 mg/dL Final   Creatinine, Ser  Date Value Ref Range Status  03/05/2022 1.53 (H) 0.76 - 1.27 mg/dL Final         Failed - eGFR in normal range and within 360 days    EGFR (African American)  Date Value Ref Range Status  10/11/2014 >60 >90m/min Final   GFR calc Af Amer  Date Value Ref Range Status  02/14/2021 54 (L) >59 mL/min/1.73 Final    Comment:    **In accordance with recommendations from the NKF-ASN Task force,**   [facility] is in the process of updating its eGFR calculation to the   2021 CKD-EPI creatinine equation that estimates kidney function   without a race variable.    EGFR (Non-African Amer.)  Date Value Ref Range Status  10/11/2014 >60 >663mmin Final    Comment:    eGFR values <6030min/1.73 m2 may be an indication of chronic kidney disease (CKD). Calculated eGFR, using the MRDR Study equation, is useful in  patients with stable renal function. The eGFR calculation will not be reliable in acutely ill patients when serum creatinine is changing rapidly. It is not useful in patients on dialysis. The eGFR calculation may not be applicable to patients at the low and high extremes of body sizes, pregnant women, and vegetarians.    GFR calc non Af Amer  Date Value Ref Range Status  02/14/2021 47 (L) >59 mL/min/1.73 Final   eGFR  Date Value Ref Range Status  03/05/2022 48 (L) >59 mL/min/1.73 Final         Passed - HBA1C is between 0 and 7.9 and within 180 days    Hemoglobin A1C  Date Value Ref Range Status  10/11/2014 6.9 (H) 4.2 - 6.3 % Final    Comment:    The  American Diabetes Association recommends that a primary goal of therapy should be <7% and that physicians should reevaluate the treatment regimen in patients with HbA1c values consistently >8%.    Hgb A1c MFr Bld  Date Value Ref Range Status  03/05/2022 7.9 (H) 4.8 - 5.6 % Final    Comment:             Prediabetes: 5.7 - 6.4          Diabetes: >6.4          Glycemic control for adults with diabetes: <7.0          Passed - Valid encounter within last 6 months    Recent Outpatient Visits          4 months ago [facility]   7 months ago [facility] to type 2 diabetes [facility]   7 months ago [facility]COP[facility]   12 months ago Type II diabetes mellitus with complication [facility]   1 year ago Cellulitis of right upper extremity   [facility] Glean Hess, MD      Future Appointments            Tomorrow Glean Hess, MD [facility] [place]           . allopurinol (ZYLOPRIM) 300 MG tablet [Pharmacy Med Name: ALLOPURINOL 300MG TABLETS] 90 tablet 0    Sig: TAKE 1 TABLET BY MOUTH DAILY     Endocrinology:  Gout Agents - allopurinol Failed - 07/13/2022  6:21 AM      Failed - Uric Acid in normal range and within 360 days    Uric Acid  Date Value Ref Range Status  09/28/2020 3.5 (L) 3.8 - 8.4 mg/dL Final    Comment:               Therapeutic target for gout patients: <6.0         Failed - Cr in normal range and within 360 days    Creatinine  Date Value Ref Range Status  10/11/2014 1.19 0.60 - 1.30 mg/dL Final   Creatinine, Ser  Date Value Ref Range Status  03/05/2022 1.53 (H) 0.76 - 1.27 mg/dL Final         Passed - Valid encounter within last 12 months    Recent  Outpatient Visits          4 months ago Type II diabetes mellitus with complication [facility])   [facility] Glean Hess, MD   7 months ago Polyneuropathy due to type 2 diabetes mellitus [facility])   [facility] Glean Hess, MD   7 months ago Acute exacerbation of chronic obstructive pulmonary disease (COPD) [facility])   [facility] Glean Hess, MD   12 months ago Type II diabetes mellitus with complication [facility])   [facility] Glean Hess, MD   1 year ago Cellulitis of right upper extremity   [facility] Glean Hess, MD      Future Appointments            Tomorrow Glean Hess, MD [facility]           Passed - CBC within normal limits and completed in the last 12 months    WBC  Date Value Ref Range Status  11/29/2021 12.9 (H) 3.4 - 10.8 x10E3/uL Final  11/20/2019 8.5 4.0 - 10.5 K/uL Final   RBC  Date Value Ref Range Status  11/29/2021 4.47 4.14 - 5.80 x10E6/uL Final  11/20/2019 4.75 4.22 - 5.81 MIL/uL Final   Hemoglobin  Date Value Ref Range Status  11/29/2021 13.9 13.0 - 17.7 g/dL Final   Hematocrit  Date Value Ref Range Status  11/29/2021 40.7 37.5 - 51.0 % Final   MCHC  Date Value Ref Range Status  11/29/2021 34.2 31.5 - 35.7 g/dL Final  11/20/2019 33.2 30.0 - 36.0 g/dL Final   [facility]  Date Value Ref Range Status  11/29/2021 31.1 26.6 - 33.0 pg Final  11/20/2019 30.7 26.0 - 34.0 pg Final   MCV  Date Value Ref Range Status  11/29/2021 91 79 - 97 fL Final  10/10/2014 96 80 - 100 fL Final   No results found for: "PLTCOUNTKUC", "LABPLAT", "POCPLA" RDW  Date Value Ref Range Status  11/29/2021 12.8 11.6 - 15.4 % Final  10/10/2014 14.2 11.5 - 14.5 % Final         .  glipiZIDE (GLUCOTROL XL) 10 MG 24 hr tablet [Pharmacy Med Name: GLIPIZIDE ER 10MG TABLETS] 90 tablet 0    Sig: TAKE 1 TABLET(10 MG) BY MOUTH DAILY     Endocrinology:  Diabetes - Sulfonylureas Failed - 07/13/2022   6:21 AM      Failed - Cr in normal range and within 360 days    Creatinine  Date Value Ref Range Status  10/11/2014 1.19 0.60 - 1.30 mg/dL Final   Creatinine, Ser  Date Value Ref Range Status  03/05/2022 1.53 (H) 0.76 - 1.27 mg/dL Final         Passed - HBA1C is between 0 and 7.9 and within 180 days    Hemoglobin A1C  Date Value Ref Range Status  10/11/2014 6.9 (H) 4.2 - 6.3 % Final    Comment:    The American Diabetes Association recommends that a primary goal of therapy should be <7% and that physicians should reevaluate the treatment regimen in patients with HbA1c values consistently >8%.    Hgb A1c MFr Bld  Date Value Ref Range Status  03/05/2022 7.9 (H) 4.8 - 5.6 % Final    Comment:             Prediabetes: 5.7 - 6.4          Diabetes: >6.4          Glycemic control for adults with diabetes: <7.0          Passed - Valid encounter within last 6 months    Recent Outpatient Visits          4 months ago Type II diabetes mellitus with complication [facility])   [facility] Glean Hess, MD   7 months ago Polyneuropathy due to type 2 diabetes mellitus [facility])   [facility] Glean Hess, MD   7 months ago Acute exacerbation of chronic obstructive pulmonary disease (COPD) [facility])   [facility] Glean Hess, MD   12 months ago Type II diabetes mellitus with complication [facility])   [facility] Glean Hess, MD   1 year ago Cellulitis of right upper extremity   [facility] Glean Hess, MD      Future Appointments            Tomorrow Glean Hess, MD [facility] [place]

## 2022-07-16 ENCOUNTER — Ambulatory Visit (INDEPENDENT_AMBULATORY_CARE_PROVIDER_SITE_OTHER): Payer: Medicare Other | Admitting: Internal Medicine

## 2022-07-16 ENCOUNTER — Encounter: Payer: Self-pay | Admitting: Internal Medicine

## 2022-07-16 VITALS — BP 128/78 | HR 81 | Ht 69.0 in | Wt 187.0 lb

## 2022-07-16 DIAGNOSIS — I10 Essential (primary) hypertension: Secondary | ICD-10-CM | POA: Diagnosis not present

## 2022-07-16 DIAGNOSIS — E118 Type 2 diabetes mellitus with unspecified complications: Secondary | ICD-10-CM | POA: Diagnosis not present

## 2022-07-16 DIAGNOSIS — I25119 Atherosclerotic heart disease of native coronary artery with unspecified angina pectoris: Secondary | ICD-10-CM | POA: Diagnosis not present

## 2022-07-16 DIAGNOSIS — K219 Gastro-esophageal reflux disease without esophagitis: Secondary | ICD-10-CM

## 2022-07-16 LAB — POCT GLYCOSYLATED HEMOGLOBIN (HGB A1C): Hemoglobin A1C: 6.9 % — AB (ref 4.0–5.6)

## 2022-07-16 MED ORDER — LEVEMIR FLEXPEN 100 UNIT/ML ~~LOC~~ SOPN: 50.0000 [IU] | PEN_INJECTOR | Freq: Every day | SUBCUTANEOUS | 5 refills | Status: DC

## 2022-07-16 MED ORDER — LINAGLIPTIN 5 MG PO TABS: 5.0000 mg | ORAL_TABLET | Freq: Every day | ORAL | 0 refills | Status: DC

## 2022-07-16 MED ORDER — PANTOPRAZOLE SODIUM 40 MG PO TBEC: DELAYED_RELEASE_TABLET | ORAL | 1 refills | Status: DC

## 2022-07-16 MED ORDER — EMPAGLIFLOZIN 25 MG PO TABS: ORAL_TABLET | ORAL | 1 refills | Status: DC

## 2022-07-16 MED ORDER — GLIPIZIDE ER 10 MG PO TB24: 10.0000 mg | ORAL_TABLET | Freq: Every day | ORAL | 1 refills | Status: DC

## 2022-07-16 NOTE — Progress Notes (Signed)
Date:  07/16/2022   Name:  Gary Gutierrez   DOB:  12-Mar-1951   MRN:  [id number]   Chief Complaint: Diabetes and Hypertension  Diabetes He presents for his follow-up diabetic visit. He has type 2 diabetes mellitus. His disease course has been stable. Pertinent negatives for hypoglycemia include no headaches or tremors. Pertinent negatives for diabetes include no chest pain, no fatigue, no polydipsia and no polyuria. Current diabetic treatments: Glipizide, Levemir, Jardiance, Tradjenta. He is compliant with treatment all of the time.    Lab Results  Component Value Date   NA 140 03/05/2022   K 4.7 03/05/2022   CO2 23 03/05/2022   GLUCOSE 192 (H) 03/05/2022   BUN 38 (H) 03/05/2022   CREATININE 1.53 (H) 03/05/2022   CALCIUM 9.6 03/05/2022   EGFR 48 (L) 03/05/2022   GFRNONAA 47 (L) 02/14/2021   Lab Results  Component Value Date   CHOL 126 11/29/2021   HDL 22 (L) 11/29/2021   LDLCALC 76 11/29/2021   TRIG 161 (H) 11/29/2021   CHOLHDL 5.7 (H) 11/29/2021   Lab Results  Component Value Date   TSH 2.85 10/11/2014   Lab Results  Component Value Date   HGBA1C 6.9 (A) 07/16/2022   Lab Results  Component Value Date   WBC 12.9 (H) 11/29/2021   HGB 13.9 11/29/2021   HCT 40.7 11/29/2021   MCV 91 11/29/2021   PLT 242 11/29/2021   Lab Results  Component Value Date   ALT 54 (H) 11/29/2021   AST 40 11/29/2021   ALKPHOS 132 (H) 11/29/2021   BILITOT 0.7 11/29/2021   No results found for: "25OHVITD2", "25OHVITD3", "VD25OH"   Review of Systems  Constitutional:  Negative for appetite change, fatigue and unexpected weight change.  Eyes:  Negative for visual disturbance.  Respiratory:  Negative for cough, shortness of breath and wheezing.   Cardiovascular:  Negative for chest pain, palpitations and leg swelling.  Gastrointestinal:  Negative for abdominal pain and blood in stool.  Endocrine: Negative for polydipsia and polyuria.  Genitourinary:  Negative for dysuria and  hematuria.  Skin:  Negative for color change and rash.  Neurological:  Negative for tremors, numbness and headaches.  Psychiatric/Behavioral:  Negative for dysphoric mood.     Patient Active Problem List   Diagnosis Date Noted   Background diabetic retinopathy associated with type 2 diabetes mellitus ([place]) 05/07/2022   CKD stage 3 secondary to diabetes ([place]) 07/06/2020   Thoracic aortic atherosclerosis ([place]) 07/05/2020   Acute on chronic combined systolic and diastolic CHF (congestive heart failure) ([place]) 11/17/2019   Type II diabetes mellitus with complication ([place]) 12/45/8099   CHF (congestive heart failure) ([place]) 10/29/2017   Polyneuropathy due to type 2 diabetes mellitus ([place]) 07/01/2017   PAD (peripheral artery disease) ([place]) 06/30/2017   Tobacco use disorder, mild, in sustained remission, abuse 04/10/2017   Hx of colonic polyps 04/10/2016   Hearing loss of both ears 01/03/2016   Chronic GERD 06/09/2015   Hyperlipidemia associated with type 2 diabetes mellitus ([place]) 06/09/2015   Essential hypertension 06/09/2015   Multilevel degenerative disc disease 06/09/2015   Coronary artery disease involving native coronary artery of native heart with angina pectoris ([place]) 05/09/2014    Allergies  Allergen Reactions   Entresto [Sacubitril-Valsartan] Other (See Comments)    Low blood pressure   Penicillins Other (See Comments)    Other reaction(s): Unknown, pt does not know reactions    Past Surgical History:  Procedure Laterality Date   APPENDECTOMY  colonscopy  2010   benign polyps   CORONARY ARTERY BYPASS GRAFT  1995   CORONARY STENT INTERVENTION N/A 02/03/2017   Procedure: Coronary Stent Intervention;  Surgeon: Yolonda Kida, MD;  Location: [facility];  Service: Cardiovascular;  Laterality: N/A;   ESOPHAGOGASTRODUODENOSCOPY  2010   normal   LEFT HEART CATH AND CORONARY ANGIOGRAPHY N/A 02/03/2017   Procedure: Left Heart Cath and Coronary Angiography;  Surgeon:  Yolonda Kida, MD;  Location: [facility] [facility];  Service: Cardiovascular;  Laterality: N/A;   PERCUTANEOUS CORONARY STENT INTERVENTION (PCI-S)  2010, 2015   4 stents total    Social History   Tobacco Use   Smoking status: Former    Packs/day: 1.50    Years: 29.00    Total pack years: 43.50    Types: Cigarettes    Quit date: 12/30/1994    Years since quitting: 27.5   Smokeless tobacco: Never   Tobacco comments:    smoking cessation materials not required  Vaping Use   Vaping Use: Never used  Substance Use Topics   Alcohol use: No    Alcohol/week: 0.0 standard drinks of alcohol   Drug use: No     Medication list has been reviewed and updated.  Current Meds  Medication Sig   albuterol (VENTOLIN HFA) 108 (90 Base) MCG/ACT inhaler Inhale 2 puffs into the lungs every 6 (six) hours as needed for wheezing or shortness of breath.   allopurinol (ZYLOPRIM) 300 MG tablet TAKE 1 TABLET BY MOUTH DAILY   Ascorbic Acid (VITAMIN C) 1000 MG tablet Take 1,000 mg by mouth daily.   atorvastatin (LIPITOR) 80 MG tablet TAKE 1 TABLET(80 MG) BY MOUTH AT BEDTIME (Patient taking differently: Take 80 mg by mouth at bedtime.)   budesonide-formoterol (SYMBICORT) 160-4.5 MCG/ACT inhaler Inhale 1 puff into the lungs 2 (two) times daily. (Patient taking differently: Inhale 1 puff into the lungs as needed.)   carvedilol (COREG) 6.25 MG tablet Take 6.25 mg by mouth 2 (two) times daily with a meal.   Cholecalciferol (VITAMIN D3 PO) Take by mouth.   clopidogrel (PLAVIX) 75 MG tablet TAKE 1 TABLET(75 MG) BY MOUTH DAILY   esomeprazole (NEXIUM) 40 MG capsule Take 1 capsule (40 mg total) by mouth 2 (two) times daily before a meal.   gabapentin (NEURONTIN) 100 MG capsule Take 1-3 capsules (100-300 mg total) by mouth 3 (three) times daily.   glipiZIDE (GLUCOTROL XL) 10 MG 24 hr tablet TAKE 1 TABLET(10 MG) BY MOUTH DAILY   glucose blood (ACCU-CHEK AVIVA PLUS) test strip TEST BLOOD SUGAR EVERY DAY AS DIRECTED UP  TO 4 TIMES DAILY   ibuprofen (ADVIL) 800 MG tablet TAKE 1 TABLET(800 MG) BY MOUTH DAILY AS NEEDED   Insulin Pen Needle (PEN NEEDLES 31GX5/16") 31G X 8 MM MISC 1 each by Does not apply route daily.   isosorbide mononitrate (IMDUR) 60 MG 24 hr tablet Take 60 mg by mouth daily.   losartan (COZAAR) 50 MG tablet Take 50 mg by mouth daily.   nitroGLYCERIN (NITROSTAT) 0.4 MG SL tablet DISSOLVE 1 TABLET UNDER THE TONGUE AS NEEDED FOR CHEST PAIN EVERY 5 MINUTES   potassium chloride SA (K-DUR,KLOR-CON) 20 MEQ tablet TAKE 2 TABLETS(40 MEQ) BY MOUTH DAILY (Patient taking differently: Take 40 mEq by mouth daily.)   torsemide (DEMADEX) 100 MG tablet Take by mouth 2 (two) times daily.    zinc gluconate 50 MG tablet Take 50 mg by mouth daily.   [DISCONTINUED] insulin detemir (LEVEMIR  FLEXPEN) 100 UNIT/ML FlexPen Inject 50 Units into the skin daily.   [DISCONTINUED] JARDIANCE 25 MG TABS tablet TAKE 1 TABLET(25 MG) BY MOUTH DAILY   [DISCONTINUED] linagliptin (TRADJENTA) 5 MG TABS tablet Take 1 tablet (5 mg total) by mouth daily.   [DISCONTINUED] pantoprazole (PROTONIX) 40 MG tablet TAKE 1 TABLET(40 MG) BY MOUTH TWICE DAILY       07/16/2022   10:37 AM 03/05/2022    9:02 AM 12/13/2021   10:02 AM 11/29/2021   10:41 AM  GAD 7 : Generalized Anxiety Score  Nervous, Anxious, on Edge 0 0 0 0  Control/stop worrying 0 0 0 0  Worry too much - different things 0 0 0 0  Trouble relaxing 0 0 0 0  Restless 0 0 0 0  Easily annoyed or irritable 0 0 0 0  Afraid - awful might happen 0 0 0 0  Total GAD 7 Score 0 0 0 0  Anxiety Difficulty Not difficult at all Not difficult at all Not difficult at all Not difficult at all       07/16/2022   10:37 AM 04/24/2022    8:36 AM 03/05/2022    9:02 AM  Depression screen PHQ 2/9  Decreased Interest 1 0 2  Down, Depressed, Hopeless 0 0 2  PHQ - 2 Score 1 0 4  Altered sleeping 0 0 3  Tired, decreased energy 1 1 0  Change in appetite 0 0 0  Feeling bad or failure about yourself  0  0 0  Trouble concentrating 0 0 0  Moving slowly or fidgety/restless 0 0 0  Suicidal thoughts 0 0 0  PHQ-9 Score 2 1 7   Difficult doing work/chores Not difficult at all Not difficult at all Not difficult at all    BP Readings from Last 3 Encounters:  07/16/22 128/78  04/24/22 118/74  03/05/22 108/74    Physical Exam Vitals and nursing note reviewed.  Constitutional:      General: He is not in acute distress.    Appearance: He is well-developed.  HENT:     Head: Normocephalic and atraumatic.  Neck:     Vascular: No carotid bruit.  Cardiovascular:     Rate and Rhythm: Normal rate and regular rhythm.     Pulses: Normal pulses.     Heart sounds: No murmur heard. Pulmonary:     Effort: Pulmonary effort is normal. No respiratory distress.     Breath sounds: No wheezing or rhonchi.  Musculoskeletal:     Cervical back: Normal range of motion.  Lymphadenopathy:     Cervical: No cervical adenopathy.  Skin:    General: Skin is warm and dry.     Findings: No rash.  Neurological:     General: No focal deficit present.     Mental Status: He is alert and oriented to person, place, and time.  Psychiatric:        Mood and Affect: Mood normal.        Behavior: Behavior normal.     Wt Readings from Last 3 Encounters:  07/16/22 187 lb (84.8 kg)  04/24/22 191 lb 6.4 oz (86.8 kg)  03/05/22 194 lb 9.6 oz (88.3 kg)    BP 128/78   Pulse 81   Ht 5' 9"  (1.753 m)   Wt 187 lb (84.8 kg)   SpO2 97%   BMI 27.62 kg/m   Assessment and Plan: 1. Type II diabetes mellitus with complication (HCC) BS improved despite being out of  insulin for 2 weeks - now has the refill. - POCT glycosylated hemoglobin (Hb A1C) - insulin detemir (LEVEMIR FLEXPEN) 100 UNIT/ML FlexPen; Inject 50 Units into the skin daily.  Dispense: 15 mL; Refill: 5 - linagliptin (TRADJENTA) 5 MG TABS tablet; Take 1 tablet (5 mg total) by mouth daily.  Dispense: 90 tablet; Refill: 0 - empagliflozin (JARDIANCE) 25 MG TABS  tablet; TAKE 1 TABLET(25 MG) BY MOUTH DAILY  Dispense: 90 tablet; Refill: 1 - glipiZIDE (GLUCOTROL XL) 10 MG 24 hr tablet; Take 1 tablet (10 mg total) by mouth daily with breakfast.  Dispense: 90 tablet; Refill: 1  2. Coronary artery disease involving native coronary artery of native heart with angina pectoris (HCC) Stable, followed by Dr. Clayborn Bigness.  3. Essential hypertension Clinically stable exam with well controlled BP. Tolerating medications without side effects at this time. Pt to continue current regimen and low sodium diet; benefits of regular exercise as able discussed.  4. Chronic GERD - pantoprazole (PROTONIX) 40 MG tablet; TAKE 1 TABLET(40 MG) BY MOUTH TWICE DAILY  Dispense: 180 tablet; Refill: 1   Partially dictated using Editor, commissioning. Any errors are unintentional.  Halina Maidens, MD [facility]  07/16/2022

## 2022-09-27 ENCOUNTER — Encounter: Payer: Self-pay | Admitting: Internal Medicine

## 2022-09-27 ENCOUNTER — Ambulatory Visit (INDEPENDENT_AMBULATORY_CARE_PROVIDER_SITE_OTHER): Payer: Medicare Other | Admitting: Internal Medicine

## 2022-09-27 VITALS — BP 128/60 | HR 103 | Ht 69.0 in | Wt 183.0 lb

## 2022-09-27 DIAGNOSIS — J189 Pneumonia, unspecified organism: Secondary | ICD-10-CM | POA: Diagnosis not present

## 2022-09-27 DIAGNOSIS — I25119 Atherosclerotic heart disease of native coronary artery with unspecified angina pectoris: Secondary | ICD-10-CM

## 2022-09-27 MED ORDER — ALBUTEROL SULFATE HFA 108 (90 BASE) MCG/ACT IN AERS: 2.0000 | INHALATION_SPRAY | Freq: Four times a day (QID) | RESPIRATORY_TRACT | 2 refills | Status: DC | PRN

## 2022-09-27 MED ORDER — PREDNISONE 10 MG PO TABS: 10.0000 mg | ORAL_TABLET | ORAL | 0 refills | Status: AC

## 2022-09-27 MED ORDER — AZITHROMYCIN 250 MG PO TABS: ORAL_TABLET | ORAL | 0 refills | Status: AC

## 2022-09-27 MED ORDER — PROMETHAZINE-DM 6.25-15 MG/5ML PO SYRP: 5.0000 mL | ORAL_SOLUTION | Freq: Four times a day (QID) | ORAL | 0 refills | Status: AC | PRN

## 2022-09-27 NOTE — Progress Notes (Signed)
Date:  09/27/2022   Name:  Gary Gutierrez   DOB:  [birth year]   MRN:  [id number]   Chief Complaint: Cough  Cough This is a new problem. Episode onset: 3 days. The problem has been waxing and waning. The cough is Productive of sputum. Associated symptoms include ear pain, nasal congestion, postnasal drip and rhinorrhea. Pertinent negatives include no chest pain, chills, fever, headaches, shortness of breath or wheezing.    Lab Results  Component Value Date   NA 140 03/05/2022   K 4.7 03/05/2022   CO2 23 03/05/2022   GLUCOSE 192 (H) 03/05/2022   BUN 38 (H) 03/05/2022   CREATININE 1.53 (H) 03/05/2022   CALCIUM 9.6 03/05/2022   EGFR 48 (L) 03/05/2022   GFRNONAA 47 (L) 02/14/2021   Lab Results  Component Value Date   CHOL 126 11/29/2021   HDL 22 (L) 11/29/2021   LDLCALC 76 11/29/2021   TRIG 161 (H) 11/29/2021   CHOLHDL 5.7 (H) 11/29/2021   Lab Results  Component Value Date   TSH 2.85 10/11/2014   Lab Results  Component Value Date   HGBA1C 6.9 (A) 07/16/2022   Lab Results  Component Value Date   WBC 12.9 (H) 11/29/2021   HGB 13.9 11/29/2021   HCT 40.7 11/29/2021   MCV 91 11/29/2021   PLT 242 11/29/2021   Lab Results  Component Value Date   ALT 54 (H) 11/29/2021   AST 40 11/29/2021   ALKPHOS 132 (H) 11/29/2021   BILITOT 0.7 11/29/2021   No results found for: "25OHVITD2", "25OHVITD3", "VD25OH"   Review of Systems  Constitutional:  Positive for fatigue. Negative for chills and fever.  HENT:  Positive for ear pain, postnasal drip and rhinorrhea.   Respiratory:  Positive for cough. Negative for shortness of breath and wheezing.   Cardiovascular:  Negative for chest pain, palpitations and leg swelling.  Neurological:  Negative for dizziness, light-headedness and headaches.    Patient Active Problem List   Diagnosis Date Noted   Background diabetic retinopathy associated with type 2 diabetes mellitus ([place]) 05/07/2022   CKD stage 3 secondary to diabetes ([place])  07/06/2020   Thoracic aortic atherosclerosis ([place]) 07/05/2020   Acute on chronic combined systolic and diastolic CHF (congestive heart failure) ([place]) 11/17/2019   Type II diabetes mellitus with complication ([place]) 75/79/7282   CHF (congestive heart failure) ([place]) 10/29/2017   Polyneuropathy due to type 2 diabetes mellitus ([place]) 07/01/2017   PAD (peripheral artery disease) ([place]) 06/30/2017   Tobacco use disorder, mild, in sustained remission, abuse 04/10/2017   Hx of colonic polyps 04/10/2016   Hearing loss of both ears 01/03/2016   Chronic GERD 06/09/2015   Hyperlipidemia associated with type 2 diabetes mellitus ([place]) 06/09/2015   Essential hypertension 06/09/2015   Multilevel degenerative disc disease 06/09/2015   Coronary artery disease involving native coronary artery of native heart with angina pectoris ([place]) 05/09/2014    Allergies  Allergen Reactions   Entresto [Sacubitril-Valsartan] Other (See Comments)    Low blood pressure   Penicillins Other (See Comments)    Other reaction(s): Unknown, pt does not know reactions    Past Surgical History:  Procedure Laterality Date   APPENDECTOMY     colonscopy  2010   benign polyps   CORONARY ARTERY BYPASS GRAFT  1995   CORONARY STENT INTERVENTION N/A 02/03/2017   Procedure: Coronary Stent Intervention;  Surgeon: Yolonda Kida, MD;  Location: [facility];  Service: Cardiovascular;  Laterality: N/A;  ESOPHAGOGASTRODUODENOSCOPY  2010   normal   LEFT HEART CATH AND CORONARY ANGIOGRAPHY N/A 02/03/2017   Procedure: Left Heart Cath and Coronary Angiography;  Surgeon: Yolonda Kida, MD;  Location: [facility];  Service: Cardiovascular;  Laterality: N/A;   PERCUTANEOUS CORONARY STENT INTERVENTION (PCI-S)  2010, 2015   4 stents total    Social History   Tobacco Use   Smoking status: Former    Packs/day: 1.50    Years: 29.00    Total pack years: 43.50    Types: Cigarettes    Quit date: 12/30/1994    Years since  quitting: 27.7   Smokeless tobacco: Never   Tobacco comments:    smoking cessation materials not required  Vaping Use   Vaping Use: Never used  Substance Use Topics   Alcohol use: No    Alcohol/week: 0.0 standard drinks of alcohol   Drug use: No     Medication list has been reviewed and updated.  Current Meds  Medication Sig   allopurinol (ZYLOPRIM) 300 MG tablet TAKE 1 TABLET BY MOUTH DAILY   Ascorbic Acid (VITAMIN C) 1000 MG tablet Take 1,000 mg by mouth daily.   atorvastatin (LIPITOR) 80 MG tablet TAKE 1 TABLET(80 MG) BY MOUTH AT BEDTIME (Patient taking differently: Take 80 mg by mouth at bedtime.)   azithromycin (ZITHROMAX Z-PAK) 250 MG tablet UAD   budesonide-formoterol (SYMBICORT) 160-4.5 MCG/ACT inhaler Inhale 1 puff into the lungs 2 (two) times daily. (Patient taking differently: Inhale 1 puff into the lungs as needed.)   carvedilol (COREG) 6.25 MG tablet Take 6.25 mg by mouth 2 (two) times daily with a meal.   Cholecalciferol (VITAMIN D3 PO) Take by mouth.   clopidogrel (PLAVIX) 75 MG tablet TAKE 1 TABLET(75 MG) BY MOUTH DAILY   empagliflozin (JARDIANCE) 25 MG TABS tablet TAKE 1 TABLET(25 MG) BY MOUTH DAILY   glipiZIDE (GLUCOTROL XL) 10 MG 24 hr tablet Take 1 tablet (10 mg total) by mouth daily with breakfast.   glucose blood (ACCU-CHEK AVIVA PLUS) test strip TEST BLOOD SUGAR EVERY DAY AS DIRECTED UP TO 4 TIMES DAILY   ibuprofen (ADVIL) 800 MG tablet TAKE 1 TABLET(800 MG) BY MOUTH DAILY AS NEEDED   insulin detemir (LEVEMIR FLEXPEN) 100 UNIT/ML FlexPen Inject 50 Units into the skin daily.   Insulin Pen Needle (PEN NEEDLES 31GX5/16") 31G X 8 MM MISC 1 each by Does not apply route daily.   isosorbide mononitrate (IMDUR) 60 MG 24 hr tablet Take 60 mg by mouth daily.   linagliptin (TRADJENTA) 5 MG TABS tablet Take 1 tablet (5 mg total) by mouth daily.   losartan (COZAAR) 50 MG tablet Take 50 mg by mouth daily.   nitroGLYCERIN (NITROSTAT) 0.4 MG SL tablet DISSOLVE 1 TABLET UNDER  THE TONGUE AS NEEDED FOR CHEST PAIN EVERY 5 MINUTES   pantoprazole (PROTONIX) 40 MG tablet TAKE 1 TABLET(40 MG) BY MOUTH TWICE DAILY   potassium chloride SA (K-DUR,KLOR-CON) 20 MEQ tablet TAKE 2 TABLETS(40 MEQ) BY MOUTH DAILY (Patient taking differently: Take 40 mEq by mouth daily.)   predniSONE (DELTASONE) 10 MG tablet Take 1 tablet (10 mg total) by mouth as directed for 6 days. Take 6,5,4,3,2,1 then stop   promethazine-dextromethorphan (PROMETHAZINE-DM) 6.25-15 MG/5ML syrup Take 5 mLs by mouth 4 (four) times daily as needed for up to 9 days for cough.   torsemide (DEMADEX) 100 MG tablet Take by mouth 2 (two) times daily.    zinc gluconate 50 MG tablet Take 50 mg by  mouth daily.   [DISCONTINUED] albuterol (VENTOLIN HFA) 108 (90 Base) MCG/ACT inhaler Inhale 2 puffs into the lungs every 6 (six) hours as needed for wheezing or shortness of breath.       07/16/2022   10:37 AM 03/05/2022    9:02 AM 12/13/2021   10:02 AM 11/29/2021   10:41 AM  GAD 7 : Generalized Anxiety Score  Nervous, Anxious, on Edge 0 0 0 0  Control/stop worrying 0 0 0 0  Worry too much - different things 0 0 0 0  Trouble relaxing 0 0 0 0  Restless 0 0 0 0  Easily annoyed or irritable 0 0 0 0  Afraid - awful might happen 0 0 0 0  Total GAD 7 Score 0 0 0 0  Anxiety Difficulty Not difficult at all Not difficult at all Not difficult at all Not difficult at all       07/16/2022   10:37 AM 04/24/2022    8:36 AM 03/05/2022    9:02 AM  Depression screen PHQ 2/9  Decreased Interest 1 0 2  Down, Depressed, Hopeless 0 0 2  PHQ - 2 Score 1 0 4  Altered sleeping 0 0 3  Tired, decreased energy 1 1 0  Change in appetite 0 0 0  Feeling bad or failure about yourself  0 0 0  Trouble concentrating 0 0 0  Moving slowly or fidgety/restless 0 0 0  Suicidal thoughts 0 0 0  PHQ-9 Score 2 1 7   Difficult doing work/chores Not difficult at all Not difficult at all Not difficult at all    BP Readings from Last 3 Encounters:  09/27/22  128/60  07/16/22 128/78  04/24/22 118/74    Physical Exam Vitals and nursing note reviewed.  Constitutional:      General: He is not in acute distress.    Appearance: Normal appearance. He is well-developed.  HENT:     Head: Normocephalic and atraumatic.  Cardiovascular:     Rate and Rhythm: Normal rate and regular rhythm.     Pulses: Normal pulses.     Heart sounds: No murmur heard. Pulmonary:     Effort: Pulmonary effort is normal. No respiratory distress.     Breath sounds: Examination of the right-lower field reveals wheezing. Decreased breath sounds and wheezing present. No rhonchi or rales.  Chest:     Chest wall: No tenderness.  Musculoskeletal:     Cervical back: Normal range of motion.  Lymphadenopathy:     Cervical: No cervical adenopathy.  Skin:    General: Skin is warm and dry.     Findings: No rash.  Neurological:     Mental Status: He is alert and oriented to person, place, and time.  Psychiatric:        Mood and Affect: Mood normal.        Behavior: Behavior normal.     Wt Readings from Last 3 Encounters:  09/27/22 183 lb (83 kg)  07/16/22 187 lb (84.8 kg)  04/24/22 191 lb 6.4 oz (86.8 kg)    BP 128/60   Pulse (!) 103   Ht 5' 9"  (1.753 m)   Wt 183 lb (83 kg)   SpO2 96%   BMI 27.02 kg/m   Assessment and Plan: 1. Community acquired pneumonia of right lower lobe of lung Continue fluids and rest - predniSONE (DELTASONE) 10 MG tablet; Take 1 tablet (10 mg total) by mouth as directed for 6 days. Take 6,5,4,3,2,1 then stop  Dispense: 21 tablet; Refill: 0 - azithromycin (ZITHROMAX Z-PAK) 250 MG tablet; UAD  Dispense: 6 each; Refill: 0 - albuterol (VENTOLIN HFA) 108 (90 Base) MCG/ACT inhaler; Inhale 2 puffs into the lungs every 6 (six) hours as needed for wheezing or shortness of breath.  Dispense: 1 each; Refill: 2 - promethazine-dextromethorphan (PROMETHAZINE-DM) 6.25-15 MG/5ML syrup; Take 5 mLs by mouth 4 (four) times daily as needed for up to 9 days  for cough.  Dispense: 118 mL; Refill: 0   Partially dictated using Editor, commissioning. Any errors are unintentional.  Halina Maidens, MD [facility]  09/27/2022

## 2022-10-07 ENCOUNTER — Other Ambulatory Visit: Payer: Self-pay | Admitting: Internal Medicine

## 2022-10-08 ENCOUNTER — Ambulatory Visit: Payer: Self-pay | Admitting: *Deleted

## 2022-10-08 ENCOUNTER — Other Ambulatory Visit: Payer: Self-pay

## 2022-10-08 MED ORDER — AZITHROMYCIN 250 MG PO TABS: ORAL_TABLET | ORAL | 0 refills | Status: AC

## 2022-10-08 NOTE — Telephone Encounter (Signed)
  Chief Complaint: can not get rid of cough Symptoms: freq productive cough Frequency: frequent  Pertinent Negatives: Patient denies fever Disposition: [] ED /[] Urgent Care (no appt availability in office) / [] Appointment(In office/virtual)/ []  [facility]/ [] Home Care/ [] Refused Recommended Disposition /[] [place]/ [x]  Follow-up with PCP Additional Notes: Pt seen 9/29 for pna and can not get rid of the cough. Productive white and gray sputum. It is so frequent he is exhausted from it. He is out of the cough med, antibiotic and steroid Dr Army Melia put him on. Pt asking for another round of meds.   Reason for Disposition  Cough has been present for > 3 weeks  Answer Assessment - Initial Assessment Questions 1. ONSET: "When did the cough begin?"      Seen in office 09/27/22 ? Pna, took antibiotic and steroid 2. SEVERITY: "How bad is the cough today?"      Felt like got flu after medication, feels better but still has cough 3. SPUTUM: "Describe the color of your sputum" (none, dry cough; clear, white, yellow, green)     gray 4. HEMOPTYSIS: "Are you coughing up any blood?" If so ask: "How much?" (flecks, streaks, tablespoons, etc.)     no 5. DIFFICULTY BREATHING: "Are you having difficulty breathing?" If Yes, ask: "How bad is it?" (e.g., mild, moderate, severe)    - MILD: No SOB at rest, mild SOB with walking, speaks normally in sentences, can lie down, no retractions, pulse < 100.    - MODERATE: SOB at rest, SOB with minimal exertion and prefers to sit, cannot lie down flat, speaks in phrases, mild retractions, audible wheezing, pulse 100-120.    - SEVERE: Very SOB at rest, speaks in single words, struggling to breathe, sitting hunched forward, retractions, pulse > 120      no 6. FEVER: "Do you have a fever?" If Yes, ask: "What is your temperature, how was it measured, and when did it start?"     no 7. CARDIAC HISTORY: "Do you have any history of heart disease?"  (e.g., heart attack, congestive heart failure)      CHF, 5 MI's, CABG 8. LUNG HISTORY: "Do you have any history of lung disease?"  (e.g., pulmonary embolus, asthma, emphysema)     no 9. PE RISK FACTORS: "Do you have a history of blood clots?" (or: recent major surgery, recent prolonged travel, bedridden)     no 10. OTHER SYMPTOMS: "Do you have any other symptoms?" (e.g., runny nose, wheezing, chest pain)       No, just cough 11. PREGNANCY: "Is there any chance you are pregnant?" "When was your last menstrual period?"       na 12. TRAVEL: "Have you traveled out of the country in the last month?" (e.g., travel history, exposures)       na  Protocols used: Cough - Acute Productive-A-AH

## 2022-10-08 NOTE — Telephone Encounter (Signed)
Patient said cough syrup did not help. Dr Army Melia said she will prescribe another round of antibiotics. Sent in [place] per Dr Army Melia. Pt informed if he feels like he is not getting better, to call us for an appointment. If he gets worse, he knows to go to the ER.  - Evann Koelzer

## 2022-10-08 NOTE — Telephone Encounter (Signed)
Summary: chest congestion   Pt states he was seen on 9-29 and prescribed steroids and antibiotics for chest congestion   Pt states medicine is not helping and symptoms are still present   Pt inquiring if a different antibiotic rx can be sent to the pharmacy   Please assist further      Called patient to review ongoing sx chest congestion. No answer, LVMTCB [phone]

## 2022-10-08 NOTE — Telephone Encounter (Signed)
Requested Prescriptions  Pending Prescriptions Disp Refills  . allopurinol (ZYLOPRIM) 300 MG tablet [Pharmacy Med Name: ALLOPURINOL 300MG  TABLETS] 90 tablet 0    Sig: TAKE 1 TABLET BY MOUTH DAILY     Endocrinology:  Gout Agents - allopurinol Failed - 10/07/2022  6:20 AM      Failed - Uric Acid in normal range and within 360 days    Uric Acid  Date Value Ref Range Status  09/28/2020 3.5 (L) 3.8 - 8.4 mg/dL Final    Comment:               Therapeutic target for gout patients: <6.0         Failed - Cr in normal range and within 360 days    Creatinine  Date Value Ref Range Status  10/11/2014 1.19 0.60 - 1.30 mg/dL Final   Creatinine, Ser  Date Value Ref Range Status  03/05/2022 1.53 (H) 0.76 - 1.27 mg/dL Final         Passed - Valid encounter within last 12 months    Recent Outpatient Visits          1 week ago Community acquired pneumonia of right lower lobe of lung   [place] [facility] at [facility], Jesse Sans, MD   2 months ago Type II diabetes mellitus with complication [facility])   [place] [facility] at [facility], Jesse Sans, MD   7 months ago Type II diabetes mellitus with complication [facility])   [place] [facility] at [facility], Jesse Sans, MD   9 months ago Polyneuropathy due to type 2 diabetes mellitus [facility])   [place] [facility] at [facility], Jesse Sans, MD   10 months ago Acute exacerbation of chronic obstructive pulmonary disease (COPD) [facility])   [place] [facility] at [facility], Jesse Sans, MD      Future Appointments            In 1 month Army Melia Jesse Sans, MD [facility] Primary Care and Sports Medicine at [facility], [place] within normal limits and completed in the last 12 months    WBC  Date Value Ref Range Status  11/29/2021 12.9 (H) 3.4 - 10.8  x10E3/uL Final  11/20/2019 8.5 4.0 - 10.5 K/uL Final   RBC  Date Value Ref Range Status  11/29/2021 4.47 4.14 - 5.80 x10E6/uL Final  11/20/2019 4.75 4.22 - 5.81 MIL/uL Final   Hemoglobin  Date Value Ref Range Status  11/29/2021 13.9 13.0 - 17.7 g/dL Final   Hematocrit  Date Value Ref Range Status  11/29/2021 40.7 37.5 - 51.0 % Final   MCHC  Date Value Ref Range Status  11/29/2021 34.2 31.5 - 35.7 g/dL Final  11/20/2019 33.2 30.0 - 36.0 g/dL Final   [facility]  Date Value Ref Range Status  11/29/2021 31.1 26.6 - 33.0 pg Final  11/20/2019 30.7 26.0 - 34.0 pg Final   MCV  Date Value Ref Range Status  11/29/2021 91 79 - 97 fL Final  10/10/2014 96 80 - 100 fL Final   No results found for: "PLTCOUNTKUC", "LABPLAT", "POCPLA" RDW  Date Value Ref Range Status  11/29/2021 12.8 11.6 - 15.4 % Final  10/10/2014 14.2 11.5 - 14.5 % Final

## 2022-10-28 ENCOUNTER — Encounter (INDEPENDENT_AMBULATORY_CARE_PROVIDER_SITE_OTHER): Payer: Self-pay

## 2022-11-26 ENCOUNTER — Other Ambulatory Visit: Payer: Self-pay | Admitting: Internal Medicine

## 2022-11-26 DIAGNOSIS — E118 Type 2 diabetes mellitus with unspecified complications: Secondary | ICD-10-CM

## 2022-11-26 NOTE — Telephone Encounter (Signed)
Last RF 07/16/22 #90 1 RF too soon  Requested Prescriptions  Refused Prescriptions Disp Refills   glipiZIDE (GLUCOTROL XL) 10 MG 24 hr tablet [Pharmacy Med Name: GLIPIZIDE ER 10MG  TABLETS] 90 tablet 1    Sig: TAKE 1 TABLET(10 MG) BY MOUTH DAILY     Endocrinology:  Diabetes - Sulfonylureas Failed - 11/26/2022  3:31 AM      Failed - Cr in normal range and within 360 days    Creatinine  Date Value Ref Range Status  10/11/2014 1.19 0.60 - 1.30 mg/dL Final   Creatinine, Ser  Date Value Ref Range Status  03/05/2022 1.53 (H) 0.76 - 1.27 mg/dL Final         Passed - HBA1C is between 0 and 7.9 and within 180 days    Hemoglobin A1C  Date Value Ref Range Status  07/16/2022 6.9 (A) 4.0 - 5.6 % Final  10/11/2014 6.9 (H) 4.2 - 6.3 % Final    Comment:    The American Diabetes Association recommends that a primary goal of therapy should be <7% and that physicians should reevaluate the treatment regimen in patients with HbA1c values consistently >8%.    Hgb A1c MFr Bld  Date Value Ref Range Status  03/05/2022 7.9 (H) 4.8 - 5.6 % Final    Comment:             Prediabetes: 5.7 - 6.4          Diabetes: >6.4          Glycemic control for adults with diabetes: <7.0          Passed - Valid encounter within last 6 months    Recent Outpatient Visits           2 months ago Community acquired pneumonia of right lower lobe of lung   [place] [facility] at [facility], MD   4 months ago Type II diabetes mellitus with complication [facility])   [place] [facility] at [facility], [facility], MD   8 months ago Type II diabetes mellitus with complication [facility])   [place] [facility] at [facility], [facility], MD   11 months ago Polyneuropathy due to type 2 diabetes mellitus [facility])   [place] [facility] at [facility], [facility], MD   12 months ago  Acute exacerbation of chronic obstructive pulmonary disease (COPD) [facility])    Primary Care and Sports Medicine at [facility], [facility], MD       Future Appointments             In 1 week Nyoka Cowden, [name], MD [facility] and [facility] at [facility], [facility]

## 2022-12-04 ENCOUNTER — Ambulatory Visit (INDEPENDENT_AMBULATORY_CARE_PROVIDER_SITE_OTHER): Payer: Medicare Other | Admitting: Internal Medicine

## 2022-12-04 ENCOUNTER — Encounter: Payer: Self-pay | Admitting: Internal Medicine

## 2022-12-04 VITALS — BP 90/58 | HR 87 | Ht 69.0 in | Wt 182.0 lb

## 2022-12-04 DIAGNOSIS — E118 Type 2 diabetes mellitus with unspecified complications: Secondary | ICD-10-CM | POA: Diagnosis not present

## 2022-12-04 DIAGNOSIS — E785 Hyperlipidemia, unspecified: Secondary | ICD-10-CM

## 2022-12-04 DIAGNOSIS — E1169 Type 2 diabetes mellitus with other specified complication: Secondary | ICD-10-CM | POA: Diagnosis not present

## 2022-12-04 DIAGNOSIS — E1122 Type 2 diabetes mellitus with diabetic chronic kidney disease: Secondary | ICD-10-CM | POA: Diagnosis not present

## 2022-12-04 DIAGNOSIS — Z23 Encounter for immunization: Secondary | ICD-10-CM

## 2022-12-04 DIAGNOSIS — M109 Gout, unspecified: Secondary | ICD-10-CM | POA: Diagnosis not present

## 2022-12-04 DIAGNOSIS — I10 Essential (primary) hypertension: Secondary | ICD-10-CM | POA: Diagnosis not present

## 2022-12-04 DIAGNOSIS — I5022 Chronic systolic (congestive) heart failure: Secondary | ICD-10-CM | POA: Diagnosis not present

## 2022-12-04 DIAGNOSIS — N183 Chronic kidney disease, stage 3 unspecified: Secondary | ICD-10-CM

## 2022-12-04 DIAGNOSIS — K219 Gastro-esophageal reflux disease without esophagitis: Secondary | ICD-10-CM

## 2022-12-04 DIAGNOSIS — I25119 Atherosclerotic heart disease of native coronary artery with unspecified angina pectoris: Secondary | ICD-10-CM | POA: Diagnosis not present

## 2022-12-04 MED ORDER — EMPAGLIFLOZIN 25 MG PO TABS: ORAL_TABLET | ORAL | 1 refills | Status: DC

## 2022-12-04 MED ORDER — GLIPIZIDE ER 10 MG PO TB24: 10.0000 mg | ORAL_TABLET | Freq: Every day | ORAL | 1 refills | Status: DC

## 2022-12-04 MED ORDER — ATORVASTATIN CALCIUM 80 MG PO TABS: 80.0000 mg | ORAL_TABLET | Freq: Every day | ORAL | 1 refills | Status: DC

## 2022-12-04 MED ORDER — LINAGLIPTIN 5 MG PO TABS: 5.0000 mg | ORAL_TABLET | Freq: Every day | ORAL | 1 refills | Status: DC

## 2022-12-04 MED ORDER — ALLOPURINOL 300 MG PO TABS: 300.0000 mg | ORAL_TABLET | Freq: Every day | ORAL | 1 refills | Status: DC

## 2022-12-04 MED ORDER — PANTOPRAZOLE SODIUM 40 MG PO TBEC: DELAYED_RELEASE_TABLET | ORAL | 1 refills | Status: DC

## 2022-12-04 MED ORDER — CLOPIDOGREL BISULFATE 75 MG PO TABS: ORAL_TABLET | ORAL | 1 refills | Status: DC

## 2022-12-04 NOTE — Progress Notes (Signed)
Date:  12/04/2022   Name:  REFUJIO HAYMER   DOB:  1951-05-28   MRN:  [id number]   Chief Complaint: Annual Exam Gary Gutierrez is a 71 y.o. male who presents today for his Complete Annual Exam. He feels well. He reports exercising stretching daily and 100 curl up and both arms, stair stepper. He reports he is sleeping well.   Colonoscopy: 05/2018  Immunization History  Administered Date(s) Administered   Fluad Quad(high Dose 65+) 10/18/2019, 09/28/2020   Influenza, High Dose Seasonal PF 10/10/2017, 10/27/2018   Influenza,inj,Quad PF,6+ Mos 01/03/2016   Pneumococcal Conjugate-13 04/10/2016   Pneumococcal Polysaccharide-23 06/10/2017   Tdap 08/16/2013, 04/23/2021   Health Maintenance Due  Topic Date Due   Zoster Vaccines- Shingrix (1 of 2) Never done    Lab Results  Component Value Date   PSA1 2.7 06/10/2017    Hypertension This is a chronic problem. The problem is controlled. Pertinent negatives include no chest pain, headaches, palpitations or shortness of breath. Past treatments include beta blockers, angiotensin blockers and diuretics. The current treatment provides significant improvement.  Diabetes He presents for his follow-up diabetic visit. He has type 2 diabetes mellitus. His disease course has been stable. Pertinent negatives for hypoglycemia include no dizziness, headaches or nervousness/anxiousness. Pertinent negatives for diabetes include no chest pain and no fatigue. Current diabetic treatments: jardiance, glipizide, tradjenta, insulin. An ACE inhibitor/angiotensin II receptor blocker is being taken. Eye exam is current.  Hyperlipidemia This is a chronic problem. The problem is controlled. Pertinent negatives include no chest pain, myalgias or shortness of breath. Current antihyperlipidemic treatment includes statins.  Gastroesophageal Reflux He complains of heartburn. He reports no abdominal pain, no chest pain, no choking or no wheezing. This is a recurrent  problem. The problem occurs occasionally. Pertinent negatives include no fatigue. He has tried a PPI for the symptoms.  CAD - no chest pain or change in baseline shortness of breath.  Exercises regularly without limitation.  No LE edema, PND.  He continues on beta blocker, IMDUR, torsemide from cardiology.   Lab Results  Component Value Date   NA 140 03/05/2022   K 4.7 03/05/2022   CO2 23 03/05/2022   GLUCOSE 192 (H) 03/05/2022   BUN 38 (H) 03/05/2022   CREATININE 1.53 (H) 03/05/2022   CALCIUM 9.6 03/05/2022   EGFR 48 (L) 03/05/2022   GFRNONAA 47 (L) 02/14/2021   Lab Results  Component Value Date   CHOL 126 11/29/2021   HDL 22 (L) 11/29/2021   LDLCALC 76 11/29/2021   TRIG 161 (H) 11/29/2021   CHOLHDL 5.7 (H) 11/29/2021   Lab Results  Component Value Date   TSH 2.85 10/11/2014   Lab Results  Component Value Date   HGBA1C 6.9 (A) 07/16/2022   Lab Results  Component Value Date   WBC 12.9 (H) 11/29/2021   HGB 13.9 11/29/2021   HCT 40.7 11/29/2021   MCV 91 11/29/2021   PLT 242 11/29/2021   Lab Results  Component Value Date   ALT 54 (H) 11/29/2021   AST 40 11/29/2021   ALKPHOS 132 (H) 11/29/2021   BILITOT 0.7 11/29/2021   No results found for: "25OHVITD2", "25OHVITD3", "VD25OH"   Review of Systems  Constitutional:  Negative for appetite change, chills, diaphoresis, fatigue and unexpected weight change.  HENT:  Negative for hearing loss, tinnitus, trouble swallowing and voice change.   Eyes:  Negative for visual disturbance.  Respiratory:  Negative for choking, shortness of breath and wheezing.  Cardiovascular:  Negative for chest pain, palpitations and leg swelling.  Gastrointestinal:  Positive for heartburn. Negative for abdominal pain, blood in stool, constipation and diarrhea.  Genitourinary:  Negative for difficulty urinating, dysuria and frequency.  Musculoskeletal:  Negative for arthralgias, back pain and myalgias.  Skin:  Negative for color change and rash.   Neurological:  Negative for dizziness, syncope and headaches.  Hematological:  Negative for adenopathy.  Psychiatric/Behavioral:  Negative for dysphoric mood and sleep disturbance. The patient is not nervous/anxious.     Patient Active Problem List   Diagnosis Date Noted   Background diabetic retinopathy associated with type 2 diabetes mellitus ([place]) 05/07/2022   CKD stage 3 secondary to diabetes ([place]) 07/06/2020   Thoracic aortic atherosclerosis ([place]) 07/05/2020   Acute on chronic combined systolic and diastolic CHF (congestive heart failure) ([place]) 11/17/2019   Type II diabetes mellitus with complication ([place]) 40/07/6760   CHF (congestive heart failure) ([place]) 10/29/2017   Polyneuropathy due to type 2 diabetes mellitus ([place]) 07/01/2017   PAD (peripheral artery disease) ([place]) 06/30/2017   Tobacco use disorder, mild, in sustained remission, abuse 04/10/2017   Hx of colonic polyps 04/10/2016   Hearing loss of both ears 01/03/2016   Chronic GERD 06/09/2015   Hyperlipidemia associated with type 2 diabetes mellitus ([place]) 06/09/2015   Essential hypertension 06/09/2015   Multilevel degenerative disc disease 06/09/2015   Coronary artery disease involving native coronary artery of native heart with angina pectoris ([place]) 05/09/2014    Allergies  Allergen Reactions   Entresto [Sacubitril-Valsartan] Other (See Comments)    Low blood pressure   Penicillins Other (See Comments)    Other reaction(s): Unknown, pt does not know reactions    Past Surgical History:  Procedure Laterality Date   APPENDECTOMY     colonscopy  2010   benign polyps   CORONARY ARTERY BYPASS GRAFT  1995   CORONARY STENT INTERVENTION N/A 02/03/2017   Procedure: Coronary Stent Intervention;  Surgeon: Yolonda Kida, MD;  Location: [facility];  Service: Cardiovascular;  Laterality: N/A;   ESOPHAGOGASTRODUODENOSCOPY  2010   normal   LEFT HEART CATH AND CORONARY ANGIOGRAPHY N/A 02/03/2017   Procedure: Left  Heart Cath and Coronary Angiography;  Surgeon: Yolonda Kida, MD;  Location: [facility];  Service: Cardiovascular;  Laterality: N/A;   PERCUTANEOUS CORONARY STENT INTERVENTION (PCI-S)  2010, 2015   4 stents total    Social History   Tobacco Use   Smoking status: Former    Packs/day: 1.50    Years: 29.00    Total pack years: 43.50    Types: Cigarettes    Quit date: 12/30/1994    Years since quitting: 27.9   Smokeless tobacco: Never   Tobacco comments:    smoking cessation materials not required  Vaping Use   Vaping Use: Never used  Substance Use Topics   Alcohol use: No    Alcohol/week: 0.0 standard drinks of alcohol   Drug use: No     Medication list has been reviewed and updated.  Current Meds  Medication Sig   albuterol (VENTOLIN HFA) 108 (90 Base) MCG/ACT inhaler Inhale 2 puffs into the lungs every 6 (six) hours as needed for wheezing or shortness of breath.   allopurinol (ZYLOPRIM) 300 MG tablet TAKE 1 TABLET BY MOUTH DAILY   Ascorbic Acid (VITAMIN C) 1000 MG tablet Take 1,000 mg by mouth daily.   atorvastatin (LIPITOR) 80 MG tablet TAKE 1 TABLET(80 MG) BY MOUTH AT BEDTIME (Patient  taking differently: Take 80 mg by mouth at bedtime.)   budesonide-formoterol (SYMBICORT) 160-4.5 MCG/ACT inhaler Inhale 1 puff into the lungs 2 (two) times daily. (Patient taking differently: Inhale 1 puff into the lungs as needed.)   carvedilol (COREG) 6.25 MG tablet Take 6.25 mg by mouth 2 (two) times daily with a meal.   Cholecalciferol (VITAMIN D3 PO) Take by mouth.   clopidogrel (PLAVIX) 75 MG tablet TAKE 1 TABLET(75 MG) BY MOUTH DAILY   empagliflozin (JARDIANCE) 25 MG TABS tablet TAKE 1 TABLET(25 MG) BY MOUTH DAILY   gabapentin (NEURONTIN) 100 MG capsule Take 1-3 capsules (100-300 mg total) by mouth 3 (three) times daily.   glipiZIDE (GLUCOTROL XL) 10 MG 24 hr tablet Take 1 tablet (10 mg total) by mouth daily with breakfast.   glucose blood (ACCU-CHEK AVIVA PLUS) test strip  TEST BLOOD SUGAR EVERY DAY AS DIRECTED UP TO 4 TIMES DAILY   ibuprofen (ADVIL) 800 MG tablet TAKE 1 TABLET(800 MG) BY MOUTH DAILY AS NEEDED   insulin detemir (LEVEMIR FLEXPEN) 100 UNIT/ML FlexPen Inject 50 Units into the skin daily.   Insulin Pen Needle (PEN NEEDLES 31GX5/16") 31G X 8 MM MISC 1 each by Does not apply route daily.   isosorbide mononitrate (IMDUR) 60 MG 24 hr tablet Take 60 mg by mouth daily.   linagliptin (TRADJENTA) 5 MG TABS tablet Take 1 tablet (5 mg total) by mouth daily.   losartan (COZAAR) 50 MG tablet Take 50 mg by mouth daily.   nitroGLYCERIN (NITROSTAT) 0.4 MG SL tablet DISSOLVE 1 TABLET UNDER THE TONGUE AS NEEDED FOR CHEST PAIN EVERY 5 MINUTES   pantoprazole (PROTONIX) 40 MG tablet TAKE 1 TABLET(40 MG) BY MOUTH TWICE DAILY   potassium chloride SA (K-DUR,KLOR-CON) 20 MEQ tablet TAKE 2 TABLETS(40 MEQ) BY MOUTH DAILY (Patient taking differently: Take 40 mEq by mouth daily.)   spironolactone (ALDACTONE) 25 MG tablet Take 25 mg by mouth daily.   torsemide (DEMADEX) 100 MG tablet Take by mouth 2 (two) times daily.    zinc gluconate 50 MG tablet Take 50 mg by mouth daily.       12/04/2022    9:09 AM 07/16/2022   10:37 AM 03/05/2022    9:02 AM 12/13/2021   10:02 AM  GAD 7 : Generalized Anxiety Score  Nervous, Anxious, on Edge 0 0 0 0  Control/stop worrying 0 0 0 0  Worry too much - different things 0 0 0 0  Trouble relaxing 0 0 0 0  Restless 0 0 0 0  Easily annoyed or irritable 0 0 0 0  Afraid - awful might happen 0 0 0 0  Total GAD 7 Score 0 0 0 0  Anxiety Difficulty Not difficult at all Not difficult at all Not difficult at all Not difficult at all       12/04/2022    9:09 AM 07/16/2022   10:37 AM 04/24/2022    8:36 AM  Depression screen PHQ 2/9  Decreased Interest 0 1 0  Down, Depressed, Hopeless 0 0 0  PHQ - 2 Score 0 1 0  Altered sleeping 0 0 0  Tired, decreased energy _0 Change in appetite 0 0 0  Feeling bad or failure about yourself  0 0 0  Trouble  concentrating 0 0 0  Moving slowly or fidgety/restless 0 0 0  Suicidal thoughts 0 0 0  PHQ-9 Score _1 Difficult doing work/chores Not difficult at all Not difficult at all Not  difficult at all    BP Readings from Last 3 Encounters:  12/04/22 (!) 90/58  09/27/22 128/60  07/16/22 128/78    Physical Exam Vitals and nursing note reviewed.  Constitutional:      Appearance: Normal appearance. He is well-developed.  HENT:     Head: Normocephalic.     Right Ear: There is impacted cerumen.     Left Ear: Tympanic membrane, ear canal and external ear normal.     Nose: Nose normal.  Eyes:     Conjunctiva/sclera: Conjunctivae normal.     Pupils: Pupils are equal, round, and reactive to light.  Neck:     Thyroid: No thyromegaly.     Vascular: No carotid bruit.  Cardiovascular:     Rate and Rhythm: Normal rate and regular rhythm.     Heart sounds: Normal heart sounds.  Pulmonary:     Effort: Pulmonary effort is normal.     Breath sounds: Normal breath sounds. No wheezing.  Chest:  Breasts:    Right: No mass.     Left: No mass.  Abdominal:     General: Bowel sounds are normal.     Palpations: Abdomen is soft.     Tenderness: There is no abdominal tenderness.  Musculoskeletal:        General: Normal range of motion.     Cervical back: Normal range of motion and neck supple.  Lymphadenopathy:     Cervical: No cervical adenopathy.  Skin:    General: Skin is warm and dry.  Neurological:     Mental Status: He is alert and oriented to person, place, and time.     Deep Tendon Reflexes: Reflexes are normal and symmetric.  Psychiatric:        Attention and Perception: Attention normal.        Mood and Affect: Mood normal.        Thought Content: Thought content normal.    Diabetic Foot Exam - Simple   Simple Foot Form Diabetic Foot exam was performed with the following findings: Yes 12/04/2022  9:27 AM  Visual Inspection No deformities, no ulcerations, no other skin breakdown  bilaterally: Yes Sensation Testing Intact to touch and monofilament testing bilaterally: Yes Pulse Check Posterior Tibialis and Dorsalis pulse intact bilaterally: Yes Comments      Wt Readings from Last 3 Encounters:  12/04/22 182 lb (82.6 kg)  09/27/22 183 lb (83 kg)  07/16/22 187 lb (84.8 kg)    BP (!) 90/58   Pulse 87   Ht _0  (1.753 m)   Wt 182 lb (82.6 kg)   SpO2 99%   BMI 26.88 kg/m   Assessment and Plan: 1. Essential hypertension Clinically stable exam with well controlled BP.  Low readings without symptoms  Tolerating medications without side effects at this time. Pt to continue current regimen and low sodium diet; benefits of regular exercise as able discussed. - CBC with Differential/Platelet  2. Chronic GERD Symptoms well controlled on daily PPI No red flag signs such as weight loss, n/v, melena Will continue PPI - CBC with Differential/Platelet - pantoprazole (PROTONIX) 40 MG tablet; TAKE 1 TABLET(40 MG) BY MOUTH TWICE DAILY  Dispense: 180 tablet; Refill: 1  3. Type II diabetes mellitus with complication (HCC) Clinically stable by exam and report without s/s of hypoglycemia. DM complicated by hypertension and dyslipidemia. Tolerating medications well without side effects or other concerns. - Comprehensive metabolic panel - Hemoglobin A1c - Microalbumin / creatinine urine ratio - empagliflozin (JARDIANCE)  25 MG TABS tablet; TAKE 1 TABLET(25 MG) BY MOUTH DAILY  Dispense: 90 tablet; Refill: 1 - glipiZIDE (GLUCOTROL XL) 10 MG 24 hr tablet; Take 1 tablet (10 mg total) by mouth daily with breakfast.  Dispense: 90 tablet; Refill: 1 - linagliptin (TRADJENTA) 5 MG TABS tablet; Take 1 tablet (5 mg total) by mouth daily.  Dispense: 90 tablet; Refill: 1  4. Hyperlipidemia associated with type 2 diabetes mellitus ([place]) On high dose statin - goal LDL now < 55 Consider Repatha - Lipid panel - atorvastatin (LIPITOR) 80 MG tablet; Take 1 tablet (80 mg total) by mouth  at bedtime.  Dispense: 90 tablet; Refill: 1  5. CKD stage 3 secondary to diabetes [facility]) Avoid nsaids/advil/etc Can take tylenol Will continue to monitor renal function - Comprehensive metabolic panel  6. Chronic systolic congestive heart failure (HCC) Followed by cardiology Clinically stable and doing well at present - clopidogrel (PLAVIX) 75 MG tablet; TAKE 1 TABLET(75 MG) BY MOUTH DAILY  Dispense: 90 tablet; Refill: 1  7. Gouty arthritis of both feet On prevention - no recent episodes - allopurinol (ZYLOPRIM) 300 MG tablet; Take 1 tablet (300 mg total) by mouth daily.  Dispense: 90 tablet; Refill: 1  8. Need for vaccination for pneumococcus - Pneumococcal conjugate vaccine 20-valent   Partially dictated using Editor, commissioning. Any errors are unintentional.  Halina Maidens, MD [facility]  12/04/2022

## 2022-12-05 LAB — COMPREHENSIVE METABOLIC PANEL
ALT: 18 IU/L (ref 0–44)
AST: 20 IU/L (ref 0–40)
Albumin/Globulin Ratio: 1.4 (ref 1.2–2.2)
Albumin: 4.3 g/dL (ref 3.8–4.8)
Alkaline Phosphatase: 139 IU/L — ABNORMAL HIGH (ref 44–121)
BUN/Creatinine Ratio: 20 (ref 10–24)
BUN: 29 mg/dL — ABNORMAL HIGH (ref 8–27)
Bilirubin Total: 0.7 mg/dL (ref 0.0–1.2)
CO2: 23 mmol/L (ref 20–29)
Calcium: 9.5 mg/dL (ref 8.6–10.2)
Chloride: 96 mmol/L (ref 96–106)
Creatinine, Ser: 1.45 mg/dL — ABNORMAL HIGH (ref 0.76–1.27)
Globulin, Total: 3.1 g/dL (ref 1.5–4.5)
Glucose: 125 mg/dL — ABNORMAL HIGH (ref 70–99)
Potassium: 4.3 mmol/L (ref 3.5–5.2)
Sodium: 136 mmol/L (ref 134–144)
Total Protein: 7.4 g/dL (ref 6.0–8.5)
eGFR: 51 mL/min/{1.73_m2} — ABNORMAL LOW (ref >59)

## 2022-12-05 LAB — CBC WITH DIFFERENTIAL/PLATELET
Basophils Absolute: 0 10*3/uL (ref 0.0–0.2)
Basos: 0 %
EOS (ABSOLUTE): 0.2 10*3/uL (ref 0.0–0.4)
Eos: 2 %
Hematocrit: 44.5 % (ref 37.5–51.0)
Hemoglobin: 15.2 g/dL (ref 13.0–17.7)
Immature Grans (Abs): 0 10*3/uL (ref 0.0–0.1)
Immature Granulocytes: 0 %
Lymphocytes Absolute: 2.2 10*3/uL (ref 0.7–3.1)
Lymphs: 23 %
MCH: 32.2 pg (ref 26.6–33.0)
MCHC: 34.2 g/dL (ref 31.5–35.7)
MCV: 94 fL (ref 79–97)
Monocytes Absolute: 0.8 10*3/uL (ref 0.1–0.9)
Monocytes: 9 %
Neutrophils Absolute: 6.6 10*3/uL (ref 1.4–7.0)
Neutrophils: 66 %
Platelets: 225 10*3/uL (ref 150–450)
RBC: 4.72 x10E6/uL (ref 4.14–5.80)
RDW: 14.2 % (ref 11.6–15.4)
WBC: 9.9 10*3/uL (ref 3.4–10.8)

## 2022-12-05 LAB — MICROALBUMIN / CREATININE URINE RATIO
Creatinine, Urine: 31.9 mg/dL
Microalb/Creat Ratio: <9 mg/g creat (ref 0–29)
Microalbumin, Urine: <3 ug/mL

## 2022-12-05 LAB — LIPID PANEL
Chol/HDL Ratio: 4.1 ratio (ref 0.0–5.0)
Cholesterol, Total: 146 mg/dL (ref 100–199)
HDL: 36 mg/dL — ABNORMAL LOW (ref >39)
LDL Chol Calc (NIH): 84 mg/dL (ref 0–99)
Triglycerides: 149 mg/dL (ref 0–149)
VLDL Cholesterol Cal: 26 mg/dL (ref 5–40)

## 2022-12-05 LAB — HEMOGLOBIN A1C
Est. average glucose Bld gHb Est-mCnc: 192 mg/dL
Hgb A1c MFr Bld: 8.3 % — ABNORMAL HIGH (ref 4.8–5.6)

## 2023-01-09 ENCOUNTER — Telehealth: Payer: Self-pay | Admitting: Internal Medicine

## 2023-01-09 NOTE — Telephone Encounter (Signed)
Patient walk in to ask if dr berglund can send in a placecard handicap form for him.

## 2023-01-09 NOTE — Telephone Encounter (Signed)
Will follow up with this on Monday when Dr Army Melia returns. Pt aware.

## 2023-01-23 ENCOUNTER — Other Ambulatory Visit: Payer: Self-pay | Admitting: Internal Medicine

## 2023-01-23 DIAGNOSIS — E118 Type 2 diabetes mellitus with unspecified complications: Secondary | ICD-10-CM

## 2023-01-24 NOTE — Telephone Encounter (Signed)
Last RF 12/04/22 #90 1 RF (should have enough til 05/2023)  Requested Prescriptions  Refused Prescriptions Disp Refills   TRADJENTA 5 MG TABS tablet [Pharmacy Med Name: TRADJENTA 5MG TABLETS] 90 tablet 1    Sig: TAKE 1 TABLET(5 MG) BY MOUTH DAILY     Endocrinology:  Diabetes - DPP-4 Inhibitors - linagliptin Failed - 01/23/2023  4:12 PM      Failed - HBA1C is between 0 and 7.9 and within 180 days    Hemoglobin A1C  Date Value Ref Range Status  10/11/2014 6.9 (H) 4.2 - 6.3 % Final    Comment:    The American Diabetes Association recommends that a primary goal of therapy should be <7% and that physicians should reevaluate the treatment regimen in patients with HbA1c values consistently >8%.    Hgb A1c MFr Bld  Date Value Ref Range Status  12/04/2022 8.3 (H) 4.8 - 5.6 % Final    Comment:             Prediabetes: 5.7 - 6.4          Diabetes: >6.4          Glycemic control for adults with diabetes: <7.0          Passed - Valid encounter within last 6 months    Recent Outpatient Visits           1 month ago Essential hypertension   [facility] [facility] Berglund, [name], MD   3 months ago Community acquired pneumonia of right lower lobe of lung   [place] [facility] Berglund, [name], MD   6 months ago Type II diabetes mellitus with complication (HCC)   [place] [facility] Berglund, [name], MD   10 months ago Type II diabetes mellitus with complication (HCC)   [place] [facility] at [facility] Berglund, [name], MD   1 year ago Polyneuropathy due to type 2 diabetes mellitus (HCC)   [place] [facility] Berglund, [name], MD       Future Appointments             In 2 months Berglund, [name], MD [place] [facility] at [facility], [facility]               

## 2023-01-24 NOTE — Telephone Encounter (Signed)
Last RF 12/04/22 #90 1 RF (should have enough til 05/2023)  Requested Prescriptions  Refused Prescriptions Disp Refills   TRADJENTA 5 MG TABS tablet [Pharmacy Med Name: TRADJENTA 5MG  TABLETS] 90 tablet 1    Sig: TAKE 1 TABLET(5 MG) BY MOUTH DAILY     Endocrinology:  Diabetes - DPP-4 Inhibitors - linagliptin Failed - 01/23/2023  4:12 PM      Failed - HBA1C is between 0 and 7.9 and within 180 days    Hemoglobin A1C  Date Value Ref Range Status  10/11/2014 6.9 (H) 4.2 - 6.3 % Final    Comment:    The American Diabetes Association recommends that a primary goal of therapy should be <7% and that physicians should reevaluate the treatment regimen in patients with HbA1c values consistently >8%.    Hgb A1c MFr Bld  Date Value Ref Range Status  12/04/2022 8.3 (H) 4.8 - 5.6 % Final    Comment:             Prediabetes: 5.7 - 6.4          Diabetes: >6.4          Glycemic control for adults with diabetes: <7.0          Passed - Valid encounter within last 6 months    Recent Outpatient Visits           1 month ago Essential hypertension   [place] [facility] at [facility], Jesse Sans, MD   3 months ago Community acquired pneumonia of right lower lobe of lung   [place] [facility], Jesse Sans, MD   6 months ago Type II diabetes mellitus with complication [facility])   [place] [facility] at [facility], Jesse Sans, MD   10 months ago Type II diabetes mellitus with complication [facility])   [place] [facility] at [facility], Jesse Sans, MD   1 year ago Polyneuropathy due to type 2 diabetes mellitus [facility])    [facility] Medicine at [facility], Jesse Sans, MD       Future Appointments             In 2 months Army Melia, Jesse Sans, MD [place] at [facility], [facility]

## 2023-01-27 ENCOUNTER — Other Ambulatory Visit: Payer: Self-pay | Admitting: Internal Medicine

## 2023-01-27 DIAGNOSIS — E118 Type 2 diabetes mellitus with unspecified complications: Secondary | ICD-10-CM

## 2023-01-27 MED ORDER — BASAGLAR KWIKPEN 100 UNIT/ML ~~LOC~~ SOPN: 50.0000 [IU] | PEN_INJECTOR | Freq: Every day | SUBCUTANEOUS | 3 refills | Status: DC

## 2023-01-29 ENCOUNTER — Other Ambulatory Visit: Payer: Self-pay | Admitting: Internal Medicine

## 2023-01-29 ENCOUNTER — Telehealth: Payer: Self-pay | Admitting: Internal Medicine

## 2023-01-29 MED ORDER — ACCU-CHEK AVIVA PLUS VI STRP: ORAL_STRIP | 12 refills | Status: DC

## 2023-01-29 NOTE — Telephone Encounter (Signed)
Called pt let him know that he that a copy will be at the front desk for pick up. Pt verbalized understanding.  KP

## 2023-01-29 NOTE — Telephone Encounter (Signed)
Copied from [place] [id number]. Topic: General - Other >> Jan 29, 2023  1:24 PM Sabas Sous wrote: Reason for CRM: Pt says he is also due for a handicap placard replacement, he says he needs the paperwork from his PCP to take to the [facility].

## 2023-01-29 NOTE — Telephone Encounter (Signed)
Medication Refill - Medication: glucose blood (ACCU-CHEK AVIVA PLUS) test strip    Pt tests twice a day, he says he needs at least a supply of 100. (2 boxes of the 50 count supply)  Has the patient contacted their pharmacy? Yes.   (Agent: If no, request that the patient contact the pharmacy for the refill. If patient does not wish to contact the pharmacy document the reason why and proceed with request.) (Agent: If yes, when and what did the pharmacy advise?)  Preferred Pharmacy (with phone number or street name):  [facility] [place], [address] AT [address]  [place] [address]  Phone: [phone] Fax: [phone]   Has the patient been seen for an appointment in the last year OR does the patient have an upcoming appointment? Yes.    Agent: Please be advised that RX refills may take up to 3 business days. We ask that you follow-up with your pharmacy.

## 2023-01-29 NOTE — Telephone Encounter (Signed)
Requested Prescriptions  Pending Prescriptions Disp Refills   glucose blood (ACCU-CHEK AVIVA PLUS) test strip 100 strip 12    Sig: TEST BLOOD SUGAR EVERY DAY AS DIRECTED UP TO 4 TIMES DAILY     Endocrinology: Diabetes - Testing Supplies Passed - 01/29/2023  1:50 PM      Passed - Valid encounter within last 12 months    Recent Outpatient Visits           1 month ago Essential hypertension   [place] [facility] at [facility], Jesse Sans, MD   4 months ago Community acquired pneumonia of right lower lobe of lung   [place] [facility] at [facility], Jesse Sans, MD   6 months ago Type II diabetes mellitus with complication [facility])   [place] [facility] at [facility], Jesse Sans, MD   11 months ago Type II diabetes mellitus with complication [facility])   [place] [facility] at [facility], Jesse Sans, MD   1 year ago Polyneuropathy due to type 2 diabetes mellitus [facility])   [place] [facility] at [facility], Jesse Sans, MD       Future Appointments             In 2 months Army Melia, Jesse Sans, MD [place] at [facility], [facility]

## 2023-02-03 DIAGNOSIS — Z955 Presence of coronary angioplasty implant and graft: Secondary | ICD-10-CM | POA: Diagnosis not present

## 2023-02-03 DIAGNOSIS — I4891 Unspecified atrial fibrillation: Secondary | ICD-10-CM | POA: Diagnosis not present

## 2023-02-03 DIAGNOSIS — I10 Essential (primary) hypertension: Secondary | ICD-10-CM | POA: Diagnosis not present

## 2023-02-03 DIAGNOSIS — I5022 Chronic systolic (congestive) heart failure: Secondary | ICD-10-CM | POA: Diagnosis not present

## 2023-02-03 DIAGNOSIS — I214 Non-ST elevation (NSTEMI) myocardial infarction: Secondary | ICD-10-CM | POA: Diagnosis not present

## 2023-02-03 DIAGNOSIS — J449 Chronic obstructive pulmonary disease, unspecified: Secondary | ICD-10-CM | POA: Diagnosis not present

## 2023-02-03 DIAGNOSIS — I255 Ischemic cardiomyopathy: Secondary | ICD-10-CM | POA: Diagnosis not present

## 2023-02-03 DIAGNOSIS — I739 Peripheral vascular disease, unspecified: Secondary | ICD-10-CM | POA: Diagnosis not present

## 2023-02-03 DIAGNOSIS — I25810 Atherosclerosis of coronary artery bypass graft(s) without angina pectoris: Secondary | ICD-10-CM | POA: Diagnosis not present

## 2023-02-03 DIAGNOSIS — E118 Type 2 diabetes mellitus with unspecified complications: Secondary | ICD-10-CM | POA: Diagnosis not present

## 2023-02-03 DIAGNOSIS — K219 Gastro-esophageal reflux disease without esophagitis: Secondary | ICD-10-CM | POA: Diagnosis not present

## 2023-02-03 DIAGNOSIS — E785 Hyperlipidemia, unspecified: Secondary | ICD-10-CM | POA: Diagnosis not present

## 2023-02-13 ENCOUNTER — Telehealth: Payer: Self-pay | Admitting: Internal Medicine

## 2023-02-13 ENCOUNTER — Other Ambulatory Visit: Payer: Self-pay

## 2023-02-13 MED ORDER — ACCU-CHEK AVIVA PLUS VI STRP: ORAL_STRIP | 12 refills | Status: DC

## 2023-02-13 NOTE — Telephone Encounter (Signed)
Copied from [place] [id number]. Topic: General - Other >> Feb 05, 2023  2:40 PM Sabas Sous wrote: Reason for CRM: Pt called reporting that the pharmacy does not have his testing strips [place], [place] [address] AT [place] [address] Phone: [phone] Fax: [phone]

## 2023-02-13 NOTE — Telephone Encounter (Signed)
Strips sent to pharmacy. Called pt left VM letting him know they where sent in again.  KP

## 2023-02-14 DIAGNOSIS — I5022 Chronic systolic (congestive) heart failure: Secondary | ICD-10-CM | POA: Diagnosis not present

## 2023-02-21 DIAGNOSIS — K219 Gastro-esophageal reflux disease without esophagitis: Secondary | ICD-10-CM | POA: Diagnosis not present

## 2023-02-21 DIAGNOSIS — I10 Essential (primary) hypertension: Secondary | ICD-10-CM | POA: Diagnosis not present

## 2023-02-21 DIAGNOSIS — I5022 Chronic systolic (congestive) heart failure: Secondary | ICD-10-CM | POA: Diagnosis not present

## 2023-02-21 DIAGNOSIS — E118 Type 2 diabetes mellitus with unspecified complications: Secondary | ICD-10-CM | POA: Diagnosis not present

## 2023-02-21 DIAGNOSIS — I214 Non-ST elevation (NSTEMI) myocardial infarction: Secondary | ICD-10-CM | POA: Diagnosis not present

## 2023-02-21 DIAGNOSIS — I25810 Atherosclerosis of coronary artery bypass graft(s) without angina pectoris: Secondary | ICD-10-CM | POA: Diagnosis not present

## 2023-02-21 DIAGNOSIS — I255 Ischemic cardiomyopathy: Secondary | ICD-10-CM | POA: Diagnosis not present

## 2023-02-21 DIAGNOSIS — J449 Chronic obstructive pulmonary disease, unspecified: Secondary | ICD-10-CM | POA: Diagnosis not present

## 2023-02-21 DIAGNOSIS — E785 Hyperlipidemia, unspecified: Secondary | ICD-10-CM | POA: Diagnosis not present

## 2023-02-21 DIAGNOSIS — Z87891 Personal history of nicotine dependence: Secondary | ICD-10-CM | POA: Diagnosis not present

## 2023-02-21 DIAGNOSIS — Z955 Presence of coronary angioplasty implant and graft: Secondary | ICD-10-CM | POA: Diagnosis not present

## 2023-04-07 ENCOUNTER — Ambulatory Visit: Payer: Medicare Other | Admitting: Internal Medicine

## 2023-04-28 ENCOUNTER — Ambulatory Visit (INDEPENDENT_AMBULATORY_CARE_PROVIDER_SITE_OTHER): Payer: Medicare Other | Admitting: Internal Medicine

## 2023-04-28 ENCOUNTER — Encounter: Payer: Self-pay | Admitting: Internal Medicine

## 2023-04-28 VITALS — BP 104/68 | HR 86 | Ht 69.0 in | Wt 180.8 lb

## 2023-04-28 DIAGNOSIS — I10 Essential (primary) hypertension: Secondary | ICD-10-CM

## 2023-04-28 DIAGNOSIS — K219 Gastro-esophageal reflux disease without esophagitis: Secondary | ICD-10-CM

## 2023-04-28 DIAGNOSIS — M25521 Pain in right elbow: Secondary | ICD-10-CM | POA: Diagnosis not present

## 2023-04-28 DIAGNOSIS — E118 Type 2 diabetes mellitus with unspecified complications: Secondary | ICD-10-CM | POA: Diagnosis not present

## 2023-04-28 DIAGNOSIS — M720 Palmar fascial fibromatosis [Dupuytren]: Secondary | ICD-10-CM | POA: Diagnosis not present

## 2023-04-28 DIAGNOSIS — I5022 Chronic systolic (congestive) heart failure: Secondary | ICD-10-CM

## 2023-04-28 MED ORDER — LINAGLIPTIN 5 MG PO TABS: 5.0000 mg | ORAL_TABLET | Freq: Every day | ORAL | 1 refills | Status: DC

## 2023-04-28 MED ORDER — CLOPIDOGREL BISULFATE 75 MG PO TABS
ORAL_TABLET | ORAL | 1 refills | Status: DC
Start: 2023-04-28 — End: 2023-09-10

## 2023-04-28 MED ORDER — LOSARTAN POTASSIUM 50 MG PO TABS: 50.0000 mg | ORAL_TABLET | Freq: Every day | ORAL | 1 refills | Status: DC

## 2023-04-28 MED ORDER — GLIPIZIDE ER 10 MG PO TB24: 10.0000 mg | ORAL_TABLET | Freq: Every day | ORAL | 1 refills | Status: DC

## 2023-04-28 MED ORDER — EMPAGLIFLOZIN 25 MG PO TABS
ORAL_TABLET | ORAL | 1 refills | Status: DC
Start: 2023-04-28 — End: 2023-09-10

## 2023-04-28 MED ORDER — PANTOPRAZOLE SODIUM 40 MG PO TBEC: DELAYED_RELEASE_TABLET | ORAL | 1 refills | Status: DC

## 2023-04-28 NOTE — Assessment & Plan Note (Signed)
Followed by Cardiology. Recent improvement in EF to 40% Did not tolerate Entresto; on coreg, spironolactone and demadex

## 2023-04-28 NOTE — Assessment & Plan Note (Addendum)
Clinically stable without s/s of hypoglycemia. Tolerating Jardiance, glipizide, Tradjenta and basal insulin well without side effects or other concerns. Lab Results  Component Value Date   HGBA1C 8.3 (H) 12/04/2022  Eye exam is done - will request notes

## 2023-04-28 NOTE — Assessment & Plan Note (Signed)
Clinically stable exam with well controlled BP on losartan, spironolactone, demadex. Tolerating medications without side effects. Pt to continue current regimen and low sodium diet.

## 2023-04-28 NOTE — Progress Notes (Signed)
Date:  04/28/2023   Name:  Gary Gutierrez   DOB:  [birth year]   MRN:  [id number]   Chief Complaint: Hypertension and Diabetes  Diabetes He presents for his follow-up diabetic visit. He has type 2 diabetes mellitus. His disease course has been stable. Pertinent negatives for hypoglycemia include no headaches, nervousness/anxiousness or tremors. Pertinent negatives for diabetes include no chest pain, no fatigue, no polydipsia and no polyuria. Current diabetic treatment includes insulin injections (Jardiance, glipizide and Tradjenta).  Hypertension This is a chronic problem. The problem is controlled. Pertinent negatives include no chest pain, headaches, palpitations or shortness of breath. Past treatments include angiotensin blockers, beta blockers and diuretics.    Lab Results  Component Value Date   NA 136 12/04/2022   K 4.3 12/04/2022   CO2 23 12/04/2022   GLUCOSE 125 (H) 12/04/2022   BUN 29 (H) 12/04/2022   CREATININE 1.45 (H) 12/04/2022   CALCIUM 9.5 12/04/2022   EGFR 51 (L) 12/04/2022   GFRNONAA 47 (L) 02/14/2021   Lab Results  Component Value Date   CHOL 146 12/04/2022   HDL 36 (L) 12/04/2022   LDLCALC 84 12/04/2022   TRIG 149 12/04/2022   CHOLHDL 4.1 12/04/2022   Lab Results  Component Value Date   TSH 2.85 10/11/2014   Lab Results  Component Value Date   HGBA1C 8.3 (H) 12/04/2022   Lab Results  Component Value Date   WBC 9.9 12/04/2022   HGB 15.2 12/04/2022   HCT 44.5 12/04/2022   MCV 94 12/04/2022   PLT 225 12/04/2022   Lab Results  Component Value Date   ALT 18 12/04/2022   AST 20 12/04/2022   ALKPHOS 139 (H) 12/04/2022   BILITOT 0.7 12/04/2022   No results found for: "25OHVITD2", "25OHVITD3", "VD25OH"   Review of Systems  Constitutional:  Negative for appetite change, fatigue and unexpected weight change.  HENT:  Negative for trouble swallowing.   Eyes:  Negative for visual disturbance.  Respiratory:  Negative for cough, shortness of breath  and wheezing.   Cardiovascular:  Negative for chest pain, palpitations and leg swelling.  Gastrointestinal:  Negative for abdominal pain and blood in stool.  Endocrine: Negative for polydipsia and polyuria.  Genitourinary:  Negative for dysuria and hematuria.  Musculoskeletal:  Positive for arthralgias.  Skin:  Negative for color change and rash.  Neurological:  Negative for tremors, numbness and headaches.  Psychiatric/Behavioral:  Negative for dysphoric mood and sleep disturbance. The patient is not nervous/anxious.     Patient Active Problem List   Diagnosis Date Noted   Background diabetic retinopathy associated with type 2 diabetes mellitus (HCC) 05/07/2022   CKD stage 3 secondary to diabetes (HCC) 07/06/2020   Thoracic aortic atherosclerosis (HCC) 07/05/2020   Type II diabetes mellitus with complication (HCC) 10/27/2018   CHF (congestive heart failure) (HCC) 10/29/2017   Polyneuropathy due to type 2 diabetes mellitus (HCC) 07/01/2017   PAD (peripheral artery disease) (HCC) 06/30/2017   Tobacco use disorder, mild, in sustained remission, abuse 04/10/2017   Hx of colonic polyps 04/10/2016   Hearing loss of both ears 01/03/2016   Chronic GERD 06/09/2015   Hyperlipidemia associated with type 2 diabetes mellitus (HCC) 06/09/2015   Essential hypertension 06/09/2015   Gouty arthritis of both feet 06/09/2015   Multilevel degenerative disc disease 06/09/2015   Coronary artery disease involving native coronary artery of native heart with angina pectoris (HCC) 05/09/2014    Allergies  Allergen Reactions   Entresto [Sacubitril-Valsartan] Other (  See Comments)    Low blood pressure   Penicillins Other (See Comments)    Other reaction(s): Unknown, pt does not know reactions    Past Surgical History:  Procedure Laterality Date   APPENDECTOMY     colonscopy  2010   benign polyps   CORONARY ARTERY BYPASS GRAFT  1995   CORONARY STENT INTERVENTION N/A 02/03/2017   Procedure: Coronary  Stent Intervention;  Surgeon: Alwyn Pea, MD;  Location: [facility];  Service: Cardiovascular;  Laterality: N/A;   ESOPHAGOGASTRODUODENOSCOPY  2010   normal   LEFT HEART CATH AND CORONARY ANGIOGRAPHY N/A 02/03/2017   Procedure: Left Heart Cath and Coronary Angiography;  Surgeon: Alwyn Pea, MD;  Location: [facility];  Service: Cardiovascular;  Laterality: N/A;   PERCUTANEOUS CORONARY STENT INTERVENTION (PCI-S)  2010, 2015   4 stents total    Social History   Tobacco Use   Smoking status: Former    Packs/day: 1.50    Years: 29.00    Additional pack years: 0.00    Total pack years: 43.50    Types: Cigarettes    Quit date: 12/30/1994    Years since quitting: 28.3   Smokeless tobacco: Never   Tobacco comments:    smoking cessation materials not required  Vaping Use   Vaping Use: Never used  Substance Use Topics   Alcohol use: No    Alcohol/week: 0.0 standard drinks of alcohol   Drug use: No     Medication list has been reviewed and updated.  Current Meds  Medication Sig   allopurinol (ZYLOPRIM) 300 MG tablet Take 1 tablet (300 mg total) by mouth daily.   Ascorbic Acid (VITAMIN C) 1000 MG tablet Take 1,000 mg by mouth daily.   atorvastatin (LIPITOR) 80 MG tablet Take 1 tablet (80 mg total) by mouth at bedtime.   budesonide-formoterol (SYMBICORT) 160-4.5 MCG/ACT inhaler Inhale 1 puff into the lungs 2 (two) times daily. (Patient taking differently: Inhale 1 puff into the lungs as needed.)   carvedilol (COREG) 6.25 MG tablet Take 6.25 mg by mouth 2 (two) times daily with a meal.   Cholecalciferol (VITAMIN D3 PO) Take by mouth.   glucose blood (ACCU-CHEK AVIVA PLUS) test strip TEST BLOOD SUGAR EVERY DAY AS DIRECTED UP TO 4 TIMES DAILY   Insulin Glargine (BASAGLAR KWIKPEN) 100 UNIT/ML Inject 50 Units into the skin daily.   Insulin Pen Needle (PEN NEEDLES 31GX5/16") 31G X 8 MM MISC 1 each by Does not apply route daily.   isosorbide mononitrate (IMDUR)  60 MG 24 hr tablet Take 60 mg by mouth daily.   nitroGLYCERIN (NITROSTAT) 0.4 MG SL tablet DISSOLVE 1 TABLET UNDER THE TONGUE AS NEEDED FOR CHEST PAIN EVERY 5 MINUTES   potassium chloride SA (K-DUR,KLOR-CON) 20 MEQ tablet TAKE 2 TABLETS(40 MEQ) BY MOUTH DAILY (Patient taking differently: Take 40 mEq by mouth daily.)   torsemide (DEMADEX) 100 MG tablet Take by mouth 2 (two) times daily.    zinc gluconate 50 MG tablet Take 50 mg by mouth daily.   [DISCONTINUED] albuterol (VENTOLIN HFA) 108 (90 Base) MCG/ACT inhaler Inhale 2 puffs into the lungs every 6 (six) hours as needed for wheezing or shortness of breath.   [DISCONTINUED] clopidogrel (PLAVIX) 75 MG tablet TAKE 1 TABLET(75 MG) BY MOUTH DAILY   [DISCONTINUED] empagliflozin (JARDIANCE) 25 MG TABS tablet TAKE 1 TABLET(25 MG) BY MOUTH DAILY   [DISCONTINUED] gabapentin (NEURONTIN) 100 MG capsule Take 1-3 capsules (100-300 mg total) by mouth  3 (three) times daily.   [DISCONTINUED] glipiZIDE (GLUCOTROL XL) 10 MG 24 hr tablet Take 1 tablet (10 mg total) by mouth daily with breakfast.   [DISCONTINUED] linagliptin (TRADJENTA) 5 MG TABS tablet Take 1 tablet (5 mg total) by mouth daily.   [DISCONTINUED] losartan (COZAAR) 50 MG tablet Take 50 mg by mouth daily.   [DISCONTINUED] pantoprazole (PROTONIX) 40 MG tablet TAKE 1 TABLET(40 MG) BY MOUTH TWICE DAILY   [DISCONTINUED] spironolactone (ALDACTONE) 25 MG tablet Take 25 mg by mouth daily.       04/28/2023    8:51 AM 12/04/2022    9:09 AM 07/16/2022   10:37 AM 03/05/2022    9:02 AM  GAD 7 : Generalized Anxiety Score  Nervous, Anxious, on Edge 0 0 0 0  Control/stop worrying 0 0 0 0  Worry too much - different things 0 0 0 0  Trouble relaxing 0 0 0 0  Restless 0 0 0 0  Easily annoyed or irritable 0 0 0 0  Afraid - awful might happen 0 0 0 0  Total GAD 7 Score 0 0 0 0  Anxiety Difficulty Not difficult at all Not difficult at all Not difficult at all Not difficult at all       04/28/2023    8:51 AM  12/04/2022    9:09 AM 07/16/2022   10:37 AM  Depression screen PHQ 2/9  Decreased Interest 0 0 1  Down, Depressed, Hopeless 0 0 0  PHQ - 2 Score 0 0 1  Altered sleeping 0 0 0  Tired, decreased energy 0 1 1  Change in appetite 0 0 0  Feeling bad or failure about yourself  0 0 0  Trouble concentrating 0 0 0  Moving slowly or fidgety/restless 0 0 0  Suicidal thoughts 0 0 0  PHQ-9 Score 0 1 2  Difficult doing work/chores Not difficult at all Not difficult at all Not difficult at all    BP Readings from Last 3 Encounters:  04/28/23 104/68  12/04/22 (!) 90/58  09/27/22 128/60    Physical Exam Vitals and nursing note reviewed.  Constitutional:      General: He is not in acute distress.    Appearance: Normal appearance. He is well-developed.  HENT:     Head: Normocephalic and atraumatic.  Cardiovascular:     Rate and Rhythm: Normal rate and regular rhythm.  Pulmonary:     Effort: Pulmonary effort is normal. No respiratory distress.     Breath sounds: No wheezing or rhonchi.  Musculoskeletal:     Right elbow: No swelling, deformity or effusion.     Right wrist: Tenderness (base of middle finger on palm) present.     Cervical back: Normal range of motion.     Right lower leg: No edema.     Left lower leg: No edema.  Lymphadenopathy:     Cervical: No cervical adenopathy.  Skin:    General: Skin is warm and dry.     Findings: No rash.  Neurological:     General: No focal deficit present.     Mental Status: He is alert and oriented to person, place, and time.  Psychiatric:        Mood and Affect: Mood normal.        Behavior: Behavior normal.     Wt Readings from Last 3 Encounters:  04/28/23 180 lb 12.8 oz (82 kg)  12/04/22 182 lb (82.6 kg)  09/27/22 183 lb (83 kg)  BP 104/68   Pulse 86   Ht 5\' 9"  (1.753 m)   Wt 180 lb 12.8 oz (82 kg)   SpO2 98%   BMI 26.70 kg/m   Assessment and Plan:  Problem List Items Addressed This Visit       Cardiovascular and  Mediastinum   CHF (congestive heart failure) (HCC) (Chronic)    Followed by Cardiology. Recent improvement in EF to 40% Did not tolerate Entresto; on coreg, spironolactone and demadex      Relevant Medications   losartan (COZAAR) 50 MG tablet   clopidogrel (PLAVIX) 75 MG tablet   Essential hypertension (Chronic)    Clinically stable exam with well controlled BP on losartan, spironolactone, demadex. Tolerating medications without side effects. Pt to continue current regimen and low sodium diet.       Relevant Medications   losartan (COZAAR) 50 MG tablet   Other Relevant Orders   Basic metabolic panel     Digestive   Chronic GERD (Chronic)   Relevant Medications   pantoprazole (PROTONIX) 40 MG tablet     Endocrine   Type II diabetes mellitus with complication (HCC) - Primary (Chronic)    Clinically stable without s/s of hypoglycemia. Tolerating Jardiance, glipizide, Tradjenta and basal insulin well without side effects or other concerns. Lab Results  Component Value Date   HGBA1C 8.3 (H) 12/04/2022  Eye exam is done - will request notes       Relevant Medications   losartan (COZAAR) 50 MG tablet   linagliptin (TRADJENTA) 5 MG TABS tablet   glipiZIDE (GLUCOTROL XL) 10 MG 24 hr tablet   empagliflozin (JARDIANCE) 25 MG TABS tablet   Other Relevant Orders   Basic metabolic panel   Hemoglobin A1c   Other Visit Diagnoses     Right elbow pain       normal exam recommend topical rubs/heat or ice   Contracture of palmar fascia (Dupuytren's)       mild symptoms of the right middle finger would refer to Ortho if needed       Return in about 4 months (around 08/28/2023) for DM, HTN.   Partially dictated using Dragon software, any errors are not intentional.  [name], MD [facility] [place], [place]

## 2023-04-28 NOTE — Patient Instructions (Signed)
-  It was a pleasure to see you today! Please review your visit summary for helpful information. -Lab results are usually available within 1-2 days and we will call once reviewed. -I would encourage you to follow your care via MyChart where you can access lab results, notes, messages, and more. -If you feel that we did a nice job today, please complete your after-visit survey and leave us a Google review! Your CMA today was Georgana Romain and your provider was Dr Laura Berglund, MD.  

## 2023-04-29 ENCOUNTER — Encounter: Payer: Self-pay | Admitting: Internal Medicine

## 2023-04-29 ENCOUNTER — Other Ambulatory Visit: Payer: Self-pay | Admitting: Internal Medicine

## 2023-04-29 DIAGNOSIS — E118 Type 2 diabetes mellitus with unspecified complications: Secondary | ICD-10-CM

## 2023-04-29 LAB — HEMOGLOBIN A1C
Est. average glucose Bld gHb Est-mCnc: 183 mg/dL
Hgb A1c MFr Bld: 8 % — ABNORMAL HIGH (ref 4.8–5.6)

## 2023-04-29 LAB — BASIC METABOLIC PANEL
BUN/Creatinine Ratio: 29 — ABNORMAL HIGH (ref 10–24)
BUN: 42 mg/dL — ABNORMAL HIGH (ref 8–27)
CO2: 20 mmol/L (ref 20–29)
Calcium: 9.5 mg/dL (ref 8.6–10.2)
Chloride: 94 mmol/L — ABNORMAL LOW (ref 96–106)
Creatinine, Ser: 1.44 mg/dL — ABNORMAL HIGH (ref 0.76–1.27)
Glucose: 236 mg/dL — ABNORMAL HIGH (ref 70–99)
Potassium: 4.6 mmol/L (ref 3.5–5.2)
Sodium: 135 mmol/L (ref 134–144)
eGFR: 51 mL/min/{1.73_m2} — ABNORMAL LOW (ref >59)

## 2023-04-29 MED ORDER — BASAGLAR KWIKPEN 100 UNIT/ML ~~LOC~~ SOPN: PEN_INJECTOR | SUBCUTANEOUS | 3 refills | Status: DC

## 2023-04-29 NOTE — Progress Notes (Signed)
Pt called pt informed. Pt sated he would like to keep his dosage at 50 units. Pt stated he went out of town and left him medication and didn't take it for a month. He stated he would stay at 50 and see if that lowers his A1C. He also stated with 50 units he had to get up at night to eat candy because it dropped his blood sugar in the 60's.  KP  KP

## 2023-04-30 ENCOUNTER — Ambulatory Visit (INDEPENDENT_AMBULATORY_CARE_PROVIDER_SITE_OTHER): Payer: Medicare Other

## 2023-04-30 VITALS — Ht 69.0 in | Wt 180.0 lb

## 2023-04-30 DIAGNOSIS — Z0000 Encounter for general adult medical examination without abnormal findings: Secondary | ICD-10-CM | POA: Diagnosis not present

## 2023-04-30 NOTE — Patient Instructions (Signed)
Gary Gutierrez , Thank you for taking time to come for your Medicare Wellness Visit. I appreciate your ongoing commitment to your health goals. Please review the following plan we discussed and let me know if I can assist you in the future.   These are the goals we discussed:  Goals      DIET - EAT MORE FRUITS AND VEGETABLES     DIET - INCREASE WATER INTAKE     Recommend to drink at least 6-8 8oz glasses of water per day.        This is a list of the screening recommended for you and due dates:  Health Maintenance  Topic Date Due   Zoster (Shingles) Vaccine (1 of 2) Never done   Eye exam for diabetics  04/03/2023   Colon Cancer Screening  06/19/2023   Flu Shot  07/31/2023   Hemoglobin A1C  10/28/2023   Yearly kidney health urinalysis for diabetes  12/05/2023   Complete foot exam   12/05/2023   Yearly kidney function blood test for diabetes  04/27/2024   Medicare Annual Wellness Visit  04/29/2024   DTaP/Tdap/Td vaccine (3 - Td or Tdap) 04/24/2031   Pneumonia Vaccine  Completed   Hepatitis C Screening: USPSTF Recommendation to screen - Ages 16-79 yo.  Completed   HPV Vaccine  Aged Out   COVID-19 Vaccine  Discontinued    Advanced directives: no  Conditions/risks identified: none  Next appointment: Follow up in one year for your annual wellness visit. 05/05/24 @ 8:15 am by phone  Preventive Care 73 Years and Older, Male  Preventive care refers to lifestyle choices and visits with your health care provider that can promote health and wellness. What does preventive care include? A yearly physical exam. This is also called an annual well check. Dental exams once or twice a year. Routine eye exams. Ask your health care provider how often you should have your eyes checked. Personal lifestyle choices, including: Daily care of your teeth and gums. Regular physical activity. Eating a healthy diet. Avoiding tobacco and drug use. Limiting alcohol use. Practicing safe sex. Taking  low doses of aspirin every day. Taking vitamin and mineral supplements as recommended by your health care provider. What happens during an annual well check? The services and screenings done by your health care provider during your annual well check will depend on your age, overall health, lifestyle risk factors, and family history of disease. Counseling  Your health care provider may ask you questions about your: Alcohol use. Tobacco use. Drug use. Emotional well-being. Home and relationship well-being. Sexual activity. Eating habits. History of falls. Memory and ability to understand (cognition). Work and work Astronomer. Screening  You may have the following tests or measurements: Height, weight, and BMI. Blood pressure. Lipid and cholesterol levels. These may be checked every 5 years, or more frequently if you are over 53 years old. Skin check. Lung cancer screening. You may have this screening every year starting at age 16 if you have a 30-pack-year history of smoking and currently smoke or have quit within the past 15 years. Fecal occult blood test (FOBT) of the stool. You may have this test every year starting at age 95. Flexible sigmoidoscopy or colonoscopy. You may have a sigmoidoscopy every 5 years or a colonoscopy every 10 years starting at age 90. Prostate cancer screening. Recommendations will vary depending on your family history and other risks. Hepatitis C blood test. Hepatitis B blood test. Sexually transmitted disease (STD) testing. Diabetes  screening. This is done by checking your blood sugar (glucose) after you have not eaten for a while (fasting). You may have this done every 1-3 years. Abdominal aortic aneurysm (AAA) screening. You may need this if you are a current or former smoker. Osteoporosis. You may be screened starting at age 56 if you are at high risk. Talk with your health care provider about your test results, treatment options, and if necessary, the  need for more tests. Vaccines  Your health care provider may recommend certain vaccines, such as: Influenza vaccine. This is recommended every year. Tetanus, diphtheria, and acellular pertussis (Tdap, Td) vaccine. You may need a Td booster every 10 years. Zoster vaccine. You may need this after age 22. Pneumococcal 13-valent conjugate (PCV13) vaccine. One dose is recommended after age 59. Pneumococcal polysaccharide (PPSV23) vaccine. One dose is recommended after age 54. Talk to your health care provider about which screenings and vaccines you need and how often you need them. This information is not intended to replace advice given to you by your health care provider. Make sure you discuss any questions you have with your health care provider. Document Released: 01/12/2016 Document Revised: 09/04/2016 Document Reviewed: 10/17/2015 Elsevier Interactive Patient Education  2017 [place] Prevention in the Home Falls can cause injuries. They can happen to people of all ages. There are many things you can do to make your home safe and to help prevent falls. What can I do on the outside of my home? Regularly fix the edges of walkways and driveways and fix any cracks. Remove anything that might make you trip as you walk through a door, such as a raised step or threshold. Trim any bushes or trees on the path to your home. Use bright outdoor lighting. Clear any walking paths of anything that might make someone trip, such as rocks or tools. Regularly check to see if handrails are loose or broken. Make sure that both sides of any steps have handrails. Any raised decks and porches should have guardrails on the edges. Have any leaves, snow, or ice cleared regularly. Use sand or salt on walking paths during winter. Clean up any spills in your garage right away. This includes oil or grease spills. What can I do in the bathroom? Use night lights. Install grab bars by the toilet and in the tub  and shower. Do not use towel bars as grab bars. Use non-skid mats or decals in the tub or shower. If you need to sit down in the shower, use a plastic, non-slip stool. Keep the floor dry. Clean up any water that spills on the floor as soon as it happens. Remove soap buildup in the tub or shower regularly. Attach bath mats securely with double-sided non-slip rug tape. Do not have throw rugs and other things on the floor that can make you trip. What can I do in the bedroom? Use night lights. Make sure that you have a light by your bed that is easy to reach. Do not use any sheets or blankets that are too big for your bed. They should not hang down onto the floor. Have a firm chair that has side arms. You can use this for support while you get dressed. Do not have throw rugs and other things on the floor that can make you trip. What can I do in the kitchen? Clean up any spills right away. Avoid walking on wet floors. Keep items that you use a lot in easy-to-reach places.  If you need to reach something above you, use a strong step stool that has a grab bar. Keep electrical cords out of the way. Do not use floor polish or wax that makes floors slippery. If you must use wax, use non-skid floor wax. Do not have throw rugs and other things on the floor that can make you trip. What can I do with my stairs? Do not leave any items on the stairs. Make sure that there are handrails on both sides of the stairs and use them. Fix handrails that are broken or loose. Make sure that handrails are as long as the stairways. Check any carpeting to make sure that it is firmly attached to the stairs. Fix any carpet that is loose or worn. Avoid having throw rugs at the top or bottom of the stairs. If you do have throw rugs, attach them to the floor with carpet tape. Make sure that you have a light switch at the top of the stairs and the bottom of the stairs. If you do not have them, ask someone to add them for  you. What else can I do to help prevent falls? Wear shoes that: Do not have high heels. Have rubber bottoms. Are comfortable and fit you well. Are closed at the toe. Do not wear sandals. If you use a stepladder: Make sure that it is fully opened. Do not climb a closed stepladder. Make sure that both sides of the stepladder are locked into place. Ask someone to hold it for you, if possible. Clearly mark and make sure that you can see: Any grab bars or handrails. First and last steps. Where the edge of each step is. Use tools that help you move around (mobility aids) if they are needed. These include: Canes. Walkers. Scooters. Crutches. Turn on the lights when you go into a dark area. Replace any light bulbs as soon as they burn out. Set up your furniture so you have a clear path. Avoid moving your furniture around. If any of your floors are uneven, fix them. If there are any pets around you, be aware of where they are. Review your medicines with your doctor. Some medicines can make you feel dizzy. This can increase your chance of falling. Ask your doctor what other things that you can do to help prevent falls. This information is not intended to replace advice given to you by your health care provider. Make sure you discuss any questions you have with your health care provider. Document Released: 10/12/2009 Document Revised: 05/23/2016 Document Reviewed: 01/20/2015 Elsevier Interactive Patient Education  2017 [employer].

## 2023-04-30 NOTE — Progress Notes (Signed)
I connected with  Len Blalock Gary Gutierrez on 04/30/23 by a audio enabled telemedicine application and verified that I am speaking with the correct person using two identifiers.  Patient Location: Home  Provider Location: Office/Clinic  I discussed the limitations of evaluation and management by telemedicine. The patient expressed understanding and agreed to proceed.  Subjective:   Gary Gutierrez is a 72 y.o. male who presents for Medicare Annual/Subsequent preventive examination.  Review of Systems     Cardiac Risk Factors include: advanced age (>7men, >85 women);diabetes mellitus;dyslipidemia;male gender;hypertension     Objective:    Today's Vitals   04/30/23 0824  PainSc: 0-No pain   There is no height or weight on file to calculate BMI.     04/30/2023    8:29 AM 04/24/2022    8:37 AM 04/23/2021    9:13 AM 04/19/2020    9:02 AM 11/19/2019    4:30 AM 11/17/2019   11:40 AM 06/05/2019    1:50 PM  Advanced Directives  Does Patient Have a Medical Advance Directive? No No No No No No No  Would patient like information on creating a medical advance directive? No - Patient declined No - Patient declined No - Patient declined Yes (MAU/Ambulatory/Procedural Areas - Information given) No - Patient declined  No - Patient declined    Current Medications (verified) Outpatient Encounter Medications as of 04/30/2023  Medication Sig   allopurinol (ZYLOPRIM) 300 MG tablet Take 1 tablet (300 mg total) by mouth daily.   Ascorbic Acid (VITAMIN C) 1000 MG tablet Take 1,000 mg by mouth daily.   atorvastatin (LIPITOR) 80 MG tablet Take 1 tablet (80 mg total) by mouth at bedtime.   budesonide-formoterol (SYMBICORT) 160-4.5 MCG/ACT inhaler Inhale 1 puff into the lungs 2 (two) times daily. (Patient taking differently: Inhale 1 puff into the lungs as needed.)   carvedilol (COREG) 6.25 MG tablet Take 6.25 mg by mouth 2 (two) times daily with a meal.   Cholecalciferol (VITAMIN D3 PO) Take by mouth.    clopidogrel (PLAVIX) 75 MG tablet TAKE 1 TABLET(75 MG) BY MOUTH DAILY   empagliflozin (JARDIANCE) 25 MG TABS tablet TAKE 1 TABLET(25 MG) BY MOUTH DAILY   glipiZIDE (GLUCOTROL XL) 10 MG 24 hr tablet Take 1 tablet (10 mg total) by mouth daily with breakfast.   glucose blood (ACCU-CHEK AVIVA PLUS) test strip TEST BLOOD SUGAR EVERY DAY AS DIRECTED UP TO 4 TIMES DAILY   Insulin Glargine (BASAGLAR KWIKPEN) 100 UNIT/ML 50 units every am and 10 units before supper.   Insulin Pen Needle (PEN NEEDLES 31GX5/16") 31G X 8 MM MISC 1 each by Does not apply route daily.   isosorbide mononitrate (IMDUR) 60 MG 24 hr tablet Take 60 mg by mouth daily.   linagliptin (TRADJENTA) 5 MG TABS tablet Take 1 tablet (5 mg total) by mouth daily.   losartan (COZAAR) 50 MG tablet Take 1 tablet (50 mg total) by mouth daily.   nitroGLYCERIN (NITROSTAT) 0.4 MG SL tablet DISSOLVE 1 TABLET UNDER THE TONGUE AS NEEDED FOR CHEST PAIN EVERY 5 MINUTES   pantoprazole (PROTONIX) 40 MG tablet TAKE 1 TABLET(40 MG) BY MOUTH TWICE DAILY   potassium chloride SA (K-DUR,KLOR-CON) 20 MEQ tablet TAKE 2 TABLETS(40 MEQ) BY MOUTH DAILY (Patient taking differently: Take 40 mEq by mouth daily.)   torsemide (DEMADEX) 100 MG tablet Take by mouth 2 (two) times daily.    zinc gluconate 50 MG tablet Take 50 mg by mouth daily.   No facility-administered encounter medications on  file as of 04/30/2023.    Allergies (verified) Entresto [sacubitril-valsartan] and Penicillins   History: Past Medical History:  Diagnosis Date   Acute respiratory failure due to COVID-19 [facility]) 11/19/2019   Allergy    CHF (congestive heart failure) (HCC)    Coronary artery disease    Diabetes mellitus without complication (HCC)    Hyperlipidemia    Hypertension    Myocardial infarction (HCC)    Pneumonia due to COVID-19 virus 11/19/2019   Past Surgical History:  Procedure Laterality Date   APPENDECTOMY     colonscopy  2010   benign polyps   CORONARY ARTERY BYPASS GRAFT   1995   CORONARY STENT INTERVENTION N/A 02/03/2017   Procedure: Coronary Stent Intervention;  Surgeon: Alwyn Pea, MD;  Location: [facility] INVASIVE CV LAB;  Service: Cardiovascular;  Laterality: N/A;   ESOPHAGOGASTRODUODENOSCOPY  2010   normal   LEFT HEART CATH AND CORONARY ANGIOGRAPHY N/A 02/03/2017   Procedure: Left Heart Cath and Coronary Angiography;  Surgeon: Alwyn Pea, MD;  Location: [facility] INVASIVE CV LAB;  Service: Cardiovascular;  Laterality: N/A;   PERCUTANEOUS CORONARY STENT INTERVENTION (PCI-S)  2010, 2015   4 stents total   Family History  Problem Relation Age of Onset   Heart disease Father    Lung cancer Brother    Prostate cancer Brother    Diabetes Mother    Heart disease Mother    Heart attack Mother    Congestive Heart Failure Mother    Social History   Socioeconomic History   Marital status: Married    Spouse name: Not on file   Number of children: 8   Years of education: Not on file   Highest education level: 11th grade  Occupational History   Occupation: Retired  Tobacco Use   Smoking status: Former    Packs/day: 1.50    Years: 29.00    Additional pack years: 0.00    Total pack years: 43.50    Types: Cigarettes    Quit date: 12/30/1994    Years since quitting: 28.3   Smokeless tobacco: Never   Tobacco comments:    smoking cessation materials not required  Vaping Use   Vaping Use: Never used  Substance and Sexual Activity   Alcohol use: No    Alcohol/week: 0.0 standard drinks of alcohol   Drug use: No   Sexual activity: Not Currently  Other Topics Concern   Not on file  Social History Narrative   Not on file   Social Determinants of Health   Financial Resource Strain: Low Risk  (04/30/2023)   Overall Financial Resource Strain (CARDIA)    Difficulty of Paying Living Expenses: Not hard at all  Food Insecurity: No Food Insecurity (04/30/2023)   Hunger Vital Sign    Worried About Running Out of Food in the Last Year: Never true    Ran Out  of Food in the Last Year: Never true  Transportation Needs: No Transportation Needs (04/30/2023)   PRAPARE - Administrator, Civil Service (Medical): No    Lack of Transportation (Non-Medical): No  Physical Activity: Insufficiently Active (04/30/2023)   Exercise Vital Sign    Days of Exercise per Week: 3 days    Minutes of Exercise per Session: 30 min  Stress: No Stress Concern Present (04/30/2023)   [employer] of Occupational Health - Occupational Stress Questionnaire    Feeling of Stress : Not at all  Social Connections: Moderately Integrated (04/30/2023)   Social Connection  and Isolation Panel [NHANES]    Frequency of Communication with Friends and Family: More than three times a week    Frequency of Social Gatherings with Friends and Family: More than three times a week    Attends Religious Services: More than 4 times per year    Active Member of [employer] or Organizations: No    Attends Engineer, structural: Never    Marital Status: Married    Tobacco Counseling Counseling given: Not Answered Tobacco comments: smoking cessation materials not required   Clinical Intake:  Pre-visit preparation completed: Yes  Pain : No/denies pain Pain Score: 0-No pain     Nutritional Risks: None Diabetes: Yes CBG done?: No Did pt. bring in CBG monitor from home?: No  How often do you need to have someone help you when you read instructions, pamphlets, or other written materials from your doctor or pharmacy?: 1 - Never  Diabetic?yes Nutrition Risk Assessment:  Has the patient had any N/V/D within the last 2 months?  Yes  Does the patient have any non-healing wounds?  No  Has the patient had any unintentional weight loss or weight gain?  No   Diabetes:  Is the patient diabetic?  Yes  If diabetic, was a CBG obtained today?  No  Did the patient bring in their glucometer from home?  No  How often do you monitor your CBG's? occasionally.   Financial Strains and  Diabetes Management:  Are you having any financial strains with the device, your supplies or your medication? No .  Does the patient want to be seen by Chronic Care Management for management of their diabetes?  No  Would the patient like to be referred to a Nutritionist or for Diabetic Management?  No   Diabetic Exams:  Diabetic Eye Exam: Completed 04/02/22. Marland Kitchen Pt has been advised about the importance in completing this exam.  Diabetic Foot Exam: Completed 12/04/22. Pt has been advised about the importance in completing this exam.   Interpreter Needed?: No  Information entered by :: Kennedy Bucker, LPN   Activities of Daily Living    04/30/2023    8:31 AM  In your present state of health, do you have any difficulty performing the following activities:  Hearing? 0  Vision? 0  Difficulty concentrating or making decisions? 0  Walking or climbing stairs? 0  Dressing or bathing? 0  Doing errands, shopping? 0  Preparing Food and eating ? N  Using the Toilet? N  In the past six months, have you accidently leaked urine? N  Do you have problems with loss of bowel control? N  Managing your Medications? N  Managing your Finances? N  Housekeeping or managing your Housekeeping? N    Patient Care Team: [name], MD as PCP - General (Internal Medicine) Midge Minium, MD as Consulting Physician (Gastroenterology) Alwyn Pea, MD as Consulting Physician (Cardiology) Nevada Crane, MD as Consulting Physician (Ophthalmology)  Indicate any recent Medical Services you may have received from other than [facility] providers in the past year (date may be approximate).     Assessment:   This is a routine wellness examination for Jules.  Hearing/Vision screen Hearing Screening - Comments:: No aids Vision Screening - Comments:: Readers- [facility]King  Dietary issues and exercise activities discussed: Current Exercise Habits: Home exercise routine, Type of exercise: walking,  Time (Minutes): 30, Frequency (Times/Week): 3, Weekly Exercise (Minutes/Week): 90, Intensity: Mild   Goals Addressed  This Visit's Progress    DIET - EAT MORE FRUITS AND VEGETABLES         Depression Screen    04/30/2023    8:27 AM 04/28/2023    8:51 AM 12/04/2022    9:09 AM 07/16/2022   10:37 AM 04/24/2022    8:36 AM 03/05/2022    9:02 AM 12/13/2021   10:02 AM  PHQ 2/9 Scores  PHQ - 2 Score 0 0 0 1 0 4 0  PHQ- 9 Score 0 0 1 2 1 7  0    Fall Risk    04/30/2023    8:31 AM 04/28/2023    8:51 AM 12/04/2022    9:09 AM 07/16/2022   10:37 AM 04/24/2022    8:44 AM  Fall Risk   Falls in the past year? 0 0 0 0 0  Number falls in past yr: 0 0 0 0 0  Injury with Fall? 0 0 0 0 0  Risk for fall due to : No Fall Risks No Fall Risks No Fall Risks No Fall Risks No Fall Risks  Follow up Falls prevention discussed Falls evaluation completed Falls evaluation completed Falls evaluation completed Falls prevention discussed    FALL RISK PREVENTION PERTAINING TO THE HOME:  Any stairs in or around the home? Yes  If so, are there any without handrails? No  Home free of loose throw rugs in walkways, pet beds, electrical cords, etc? Yes  Adequate lighting in your home to reduce risk of falls? Yes   ASSISTIVE DEVICES UTILIZED TO PREVENT FALLS:  Life alert? No  Use of a cane, walker or w/c? No  Grab bars in the bathroom? No  Shower chair or bench in shower? No  Elevated toilet seat or a handicapped toilet? No    Cognitive Function:        04/30/2023    8:40 AM 04/24/2022    8:47 AM 04/23/2021    9:18 AM 04/19/2020    9:06 AM 07/22/2018   10:29 AM  6CIT Screen  What Year? 0 points 0 points 0 points 0 points 0 points  What month? 0 points 0 points 0 points 0 points 0 points  What time? 0 points 0 points 0 points 0 points 0 points  Count back from 20 0 points 0 points 0 points 0 points 0 points  Months in reverse 0 points 2 points 4 points 4 points 2 points  Repeat phrase 0 points 0  points 2 points 2 points 2 points  Total Score 0 points 2 points 6 points 6 points 4 points    Immunizations Immunization History  Administered Date(s) Administered   Fluad Quad(high Dose 65+) 10/18/2019, 09/28/2020, 12/04/2022   Influenza, High Dose Seasonal PF 10/10/2017, 10/27/2018   Influenza,inj,Quad PF,6+ Mos 01/03/2016   PNEUMOCOCCAL CONJUGATE-20 12/04/2022   Pneumococcal Conjugate-13 04/10/2016   Pneumococcal Polysaccharide-23 06/10/2017   Tdap 08/16/2013, 04/23/2021    TDAP status: Up to date  Flu Vaccine status: Up to date  Pneumococcal vaccine status: Up to date  Covid-19 vaccine status: Declined, Education has been provided regarding the importance of this vaccine but patient still declined. Advised may receive this vaccine at local pharmacy or Health Dept.or vaccine clinic. Aware to provide a copy of the vaccination record if obtained from local pharmacy or Health Dept. Verbalized acceptance and understanding.  Qualifies for Shingles Vaccine? Yes   Zostavax completed No   Shingrix Completed?: No.    Education has been provided regarding the importance of  this vaccine. Patient has been advised to call insurance company to determine out of pocket expense if they have not yet received this vaccine. Advised may also receive vaccine at local pharmacy or Health Dept. Verbalized acceptance and understanding.  Screening Tests Health Maintenance  Topic Date Due   Zoster Vaccines- Shingrix (1 of 2) Never done   OPHTHALMOLOGY EXAM  04/03/2023   COLONOSCOPY (Pts 45-55yrs Insurance coverage will need to be confirmed)  06/19/2023   INFLUENZA VACCINE  07/31/2023   HEMOGLOBIN A1C  10/28/2023   Diabetic kidney evaluation - Urine ACR  12/05/2023   FOOT EXAM  12/05/2023   Diabetic kidney evaluation - eGFR measurement  04/27/2024   Medicare Annual Wellness (AWV)  04/29/2024   DTaP/Tdap/Td (3 - Td or Tdap) 04/24/2031   Pneumonia Vaccine 46+ Years old  Completed   Hepatitis C  Screening  Completed   HPV VACCINES  Aged Out   COVID-19 Vaccine  Discontinued    Health Maintenance  Health Maintenance Due  Topic Date Due   Zoster Vaccines- Shingrix (1 of 2) Never done   OPHTHALMOLOGY EXAM  04/03/2023    Colorectal cancer screening: Type of screening: Colonoscopy. Completed 06/18/18. Repeat every 5 years  Lung Cancer Screening: (Low Dose CT Chest recommended if Age 64-80 years, 30 pack-year currently smoking OR have quit w/in 15years.) does not qualify.   Additional Screening:  Hepatitis C Screening: does qualify; Completed 06/10/17  Vision Screening: Recommended annual ophthalmology exams for early detection of glaucoma and other disorders of the eye. Is the patient up to date with their annual eye exam?  Yes  Who is the provider or what is the name of the office in which the patient attends annual eye exams? Dr.King If pt is not established with a provider, would they like to be referred to a provider to establish care? No .   Dental Screening: Recommended annual dental exams for proper oral hygiene  Community Resource Referral / Chronic Care Management: CRR required this visit?  No   CCM required this visit?  No      Plan:     I have personally reviewed and noted the following in the patient's chart:   Medical and social history Use of alcohol, tobacco or illicit drugs  Current medications and supplements including opioid prescriptions. Patient is not currently taking opioid prescriptions. Functional ability and status Nutritional status Physical activity Advanced directives List of other physicians Hospitalizations, surgeries, and ER visits in previous 12 months Vitals Screenings to include cognitive, depression, and falls Referrals and appointments  In addition, I have reviewed and discussed with patient certain preventive protocols, quality metrics, and best practice recommendations. A written personalized care plan for preventive services  as well as general preventive health recommendations were provided to patient.     Hal Hope, LPN   01/04/1095   Nurse Notes: none

## 2023-05-12 DIAGNOSIS — E113292 Type 2 diabetes mellitus with mild nonproliferative diabetic retinopathy without macular edema, left eye: Secondary | ICD-10-CM | POA: Diagnosis not present

## 2023-05-12 DIAGNOSIS — E113291 Type 2 diabetes mellitus with mild nonproliferative diabetic retinopathy without macular edema, right eye: Secondary | ICD-10-CM | POA: Diagnosis not present

## 2023-05-12 LAB — HM DIABETES EYE EXAM

## 2023-05-16 ENCOUNTER — Encounter: Payer: Self-pay | Admitting: Internal Medicine

## 2023-07-18 ENCOUNTER — Other Ambulatory Visit: Payer: Self-pay | Admitting: Internal Medicine

## 2023-07-18 DIAGNOSIS — M109 Gout, unspecified: Secondary | ICD-10-CM

## 2023-07-18 NOTE — Telephone Encounter (Signed)
Requested Prescriptions  Pending Prescriptions Disp Refills   allopurinol (ZYLOPRIM) 300 MG tablet [Pharmacy Med Name: ALLOPURINOL 300MG  TABLETS] 90 tablet 0    Sig: TAKE 1 TABLET(300 MG) BY MOUTH DAILY     Endocrinology:  Gout Agents - allopurinol Failed - 07/18/2023  6:22 AM      Failed - Uric Acid in normal range and within 360 days    Uric Acid  Date Value Ref Range Status  09/28/2020 3.5 (L) 3.8 - 8.4 mg/dL Final    Comment:               Therapeutic target for gout patients: <6.0         Failed - Cr in normal range and within 360 days    Creatinine  Date Value Ref Range Status  10/11/2014 1.19 0.60 - 1.30 mg/dL Final   Creatinine, Ser  Date Value Ref Range Status  04/28/2023 1.44 (H) 0.76 - 1.27 mg/dL Final         Passed - Valid encounter within last 12 months    Recent Outpatient Visits           2 months ago Type II diabetes mellitus with complication [facility])   [place] [facility] at [facility], Nyoka Cowden, MD   7 months ago Essential hypertension   [place] [facility] at [facility], Nyoka Cowden, MD   9 months ago Community acquired pneumonia of right lower lobe of lung   [place] [facility] at [facility], Nyoka Cowden, MD   1 year ago Type II diabetes mellitus with complication [facility])   [place] [facility] at [facility], Nyoka Cowden, MD   1 year ago Type II diabetes mellitus with complication [facility])   [place] [facility] at [facility], Nyoka Cowden, MD       Future Appointments             In 1 month [name] Nyoka Cowden, MD [facility] at [facility], [facility]            Passed - CBC within normal limits and completed in the last 12 months    WBC  Date Value Ref Range Status  12/04/2022 9.9 3.4 - 10.8 x10E3/uL Final  11/20/2019 8.5 4.0 - 10.5 K/uL Final   RBC   Date Value Ref Range Status  12/04/2022 4.72 4.14 - 5.80 x10E6/uL Final  11/20/2019 4.75 4.22 - 5.81 MIL/uL Final   Hemoglobin  Date Value Ref Range Status  12/04/2022 15.2 13.0 - 17.7 g/dL Final   Hematocrit  Date Value Ref Range Status  12/04/2022 44.5 37.5 - 51.0 % Final   MCHC  Date Value Ref Range Status  12/04/2022 34.2 31.5 - 35.7 g/dL Final  16/09/9603 54.0 30.0 - 36.0 g/dL Final   [facility]  Date Value Ref Range Status  12/04/2022 32.2 26.6 - 33.0 pg Final  11/20/2019 30.7 26.0 - 34.0 pg Final   MCV  Date Value Ref Range Status  12/04/2022 94 79 - 97 fL Final  10/10/2014 96 80 - 100 fL Final   No results found for: "PLTCOUNTKUC", "LABPLAT", "POCPLA" RDW  Date Value Ref Range Status  12/04/2022 14.2 11.6 - 15.4 % Final  10/10/2014 14.2 11.5 - 14.5 % Final

## 2023-07-29 ENCOUNTER — Other Ambulatory Visit: Payer: Self-pay | Admitting: Internal Medicine

## 2023-07-29 DIAGNOSIS — E118 Type 2 diabetes mellitus with unspecified complications: Secondary | ICD-10-CM

## 2023-07-29 MED ORDER — INSULIN DEGLUDEC 100 UNIT/ML ~~LOC~~ SOPN
PEN_INJECTOR | SUBCUTANEOUS | 3 refills | Status: DC
Start: 2023-07-29 — End: 2024-01-05

## 2023-09-10 ENCOUNTER — Ambulatory Visit (INDEPENDENT_AMBULATORY_CARE_PROVIDER_SITE_OTHER): Payer: 59 | Admitting: Internal Medicine

## 2023-09-10 ENCOUNTER — Encounter: Payer: Self-pay | Admitting: Internal Medicine

## 2023-09-10 VITALS — BP 128/74 | HR 83 | Ht 69.0 in | Wt 189.0 lb

## 2023-09-10 DIAGNOSIS — Z794 Long term (current) use of insulin: Secondary | ICD-10-CM | POA: Diagnosis not present

## 2023-09-10 DIAGNOSIS — E1169 Type 2 diabetes mellitus with other specified complication: Secondary | ICD-10-CM | POA: Diagnosis not present

## 2023-09-10 DIAGNOSIS — E1142 Type 2 diabetes mellitus with diabetic polyneuropathy: Secondary | ICD-10-CM

## 2023-09-10 DIAGNOSIS — I5022 Chronic systolic (congestive) heart failure: Secondary | ICD-10-CM

## 2023-09-10 DIAGNOSIS — I25119 Atherosclerotic heart disease of native coronary artery with unspecified angina pectoris: Secondary | ICD-10-CM

## 2023-09-10 DIAGNOSIS — Z23 Encounter for immunization: Secondary | ICD-10-CM | POA: Diagnosis not present

## 2023-09-10 DIAGNOSIS — I10 Essential (primary) hypertension: Secondary | ICD-10-CM

## 2023-09-10 DIAGNOSIS — E1122 Type 2 diabetes mellitus with diabetic chronic kidney disease: Secondary | ICD-10-CM | POA: Diagnosis not present

## 2023-09-10 DIAGNOSIS — E118 Type 2 diabetes mellitus with unspecified complications: Secondary | ICD-10-CM

## 2023-09-10 DIAGNOSIS — K219 Gastro-esophageal reflux disease without esophagitis: Secondary | ICD-10-CM

## 2023-09-10 DIAGNOSIS — I739 Peripheral vascular disease, unspecified: Secondary | ICD-10-CM | POA: Diagnosis not present

## 2023-09-10 DIAGNOSIS — E785 Hyperlipidemia, unspecified: Secondary | ICD-10-CM

## 2023-09-10 DIAGNOSIS — N183 Chronic kidney disease, stage 3 unspecified: Secondary | ICD-10-CM

## 2023-09-10 LAB — POCT GLYCOSYLATED HEMOGLOBIN (HGB A1C): Hemoglobin A1C: 6.9 % — AB (ref 4.0–5.6)

## 2023-09-10 MED ORDER — GLIPIZIDE ER 10 MG PO TB24
10.0000 mg | ORAL_TABLET | Freq: Every day | ORAL | 1 refills | Status: DC
Start: 2023-09-10 — End: 2024-01-05

## 2023-09-10 MED ORDER — LINAGLIPTIN 5 MG PO TABS
5.0000 mg | ORAL_TABLET | Freq: Every day | ORAL | 1 refills | Status: DC
Start: 2023-09-10 — End: 2023-12-22

## 2023-09-10 MED ORDER — ATORVASTATIN CALCIUM 80 MG PO TABS: 80.0000 mg | ORAL_TABLET | Freq: Every day | ORAL | 1 refills | Status: DC

## 2023-09-10 MED ORDER — CLOPIDOGREL BISULFATE 75 MG PO TABS
ORAL_TABLET | ORAL | 1 refills | Status: DC
Start: 2023-09-10 — End: 2024-04-23

## 2023-09-10 MED ORDER — NITROGLYCERIN 0.4 MG SL SUBL
0.4000 mg | SUBLINGUAL_TABLET | SUBLINGUAL | 1 refills | Status: DC | PRN
Start: 2023-09-10 — End: 2024-08-20

## 2023-09-10 MED ORDER — PANTOPRAZOLE SODIUM 40 MG PO TBEC
DELAYED_RELEASE_TABLET | ORAL | 1 refills | Status: DC
Start: 2023-09-10 — End: 2024-02-26

## 2023-09-10 MED ORDER — LOSARTAN POTASSIUM 50 MG PO TABS
50.0000 mg | ORAL_TABLET | Freq: Every day | ORAL | 1 refills | Status: DC
Start: 2023-09-10 — End: 2024-05-27

## 2023-09-10 MED ORDER — EMPAGLIFLOZIN 25 MG PO TABS
ORAL_TABLET | ORAL | 1 refills | Status: DC
Start: 2023-09-10 — End: 2024-04-23

## 2023-09-10 NOTE — Assessment & Plan Note (Signed)
LDL is  Lab Results  Component Value Date   LDLCALC 84 12/04/2022   Currently being treated with atorvastin 80 mg with good compliance and no concerns.

## 2023-09-10 NOTE — Assessment & Plan Note (Signed)
Stable angina with exertion;  BP controlled Symptoms resolved with rest or NTG

## 2023-09-10 NOTE — Assessment & Plan Note (Signed)
Normal exam with stable BP on losartan, torsemide and Coreg No concerns or side effects to current medication. No change in regimen; continue low sodium diet.

## 2023-09-10 NOTE — Assessment & Plan Note (Signed)
Stable without progression No leg heaviness or muscle cramps

## 2023-09-10 NOTE — Assessment & Plan Note (Addendum)
Blood sugars stable without hypoglycemic symptoms or events. Current regimen is Tradjenta, glipizide and insulin. He is not taking Jardiance Changes made last visit are increasing Insulin to 60 units. Will refill Jardiance. Lab Results  Component Value Date   HGBA1C 8.0 (H) 04/28/2023  A1C today = 6.9

## 2023-09-10 NOTE — Assessment & Plan Note (Signed)
Constant pins and needles of both feet. No ulcerations or wounds Tried gabapentin briefly in the past - should try it again

## 2023-09-10 NOTE — Progress Notes (Signed)
Date:  09/10/2023   Name:  Gary Gutierrez   DOB:  1951/01/15   MRN:  [id number]   Chief Complaint: Diabetes and Hypertension  Diabetes He presents for his follow-up diabetic visit. He has type 2 diabetes mellitus. His disease course has been stable. Pertinent negatives for hypoglycemia include no dizziness or headaches. Pertinent negatives for diabetes include no chest pain, no fatigue and no weakness.  Hypertension This is a chronic problem. Pertinent negatives include no chest pain, headaches, palpitations or shortness of breath. Past treatments include angiotensin blockers, beta blockers, diuretics and direct vasodilators.  Hyperlipidemia This is a chronic problem. The problem is controlled. Pertinent negatives include no chest pain or shortness of breath. Current antihyperlipidemic treatment includes statins.    Lab Results  Component Value Date   NA 135 04/28/2023   K 4.6 04/28/2023   CO2 20 04/28/2023   GLUCOSE 236 (H) 04/28/2023   BUN 42 (H) 04/28/2023   CREATININE 1.44 (H) 04/28/2023   CALCIUM 9.5 04/28/2023   EGFR 51 (L) 04/28/2023   GFRNONAA 47 (L) 02/14/2021   Lab Results  Component Value Date   CHOL 146 12/04/2022   HDL 36 (L) 12/04/2022   LDLCALC 84 12/04/2022   TRIG 149 12/04/2022   CHOLHDL 4.1 12/04/2022   Lab Results  Component Value Date   TSH 2.85 10/11/2014   Lab Results  Component Value Date   HGBA1C 6.9 (A) 09/10/2023   Lab Results  Component Value Date   WBC 9.9 12/04/2022   HGB 15.2 12/04/2022   HCT 44.5 12/04/2022   MCV 94 12/04/2022   PLT 225 12/04/2022   Lab Results  Component Value Date   ALT 18 12/04/2022   AST 20 12/04/2022   ALKPHOS 139 (H) 12/04/2022   BILITOT 0.7 12/04/2022   No results found for: "25OHVITD2", "25OHVITD3", "VD25OH"   Review of Systems  Constitutional:  Negative for fatigue and unexpected weight change.  HENT:  Negative for nosebleeds.   Eyes:  Negative for visual disturbance.  Respiratory:  Negative  for cough, chest tightness, shortness of breath and wheezing.   Cardiovascular:  Negative for chest pain, palpitations and leg swelling.  Gastrointestinal:  Negative for abdominal pain, constipation and diarrhea.  Neurological:  Negative for dizziness, weakness, light-headedness and headaches.    Patient Active Problem List   Diagnosis Date Noted   Background diabetic retinopathy associated with type 2 diabetes mellitus (HCC) 05/07/2022   CKD stage 3 secondary to diabetes (HCC) 07/06/2020   Thoracic aortic atherosclerosis (HCC) 07/05/2020   Type II diabetes mellitus with complication (HCC) 10/27/2018   CHF (congestive heart failure) (HCC) 10/29/2017   Polyneuropathy due to type 2 diabetes mellitus (HCC) 07/01/2017   PAD (peripheral artery disease) (HCC) 06/30/2017   Tobacco use disorder, mild, in sustained remission, abuse 04/10/2017   Hx of colonic polyps 04/10/2016   Hearing loss of both ears 01/03/2016   Chronic GERD 06/09/2015   Hyperlipidemia associated with type 2 diabetes mellitus (HCC) 06/09/2015   Essential hypertension 06/09/2015   Gouty arthritis of both feet 06/09/2015   Multilevel degenerative disc disease 06/09/2015   Coronary artery disease involving native coronary artery of native heart with angina pectoris (HCC) 05/09/2014    Allergies  Allergen Reactions   Entresto [Sacubitril-Valsartan] Other (See Comments)    Low blood pressure   Penicillins Other (See Comments)    Other reaction(s): Unknown, pt does not know reactions    Past Surgical History:  Procedure Laterality Date  APPENDECTOMY     colonscopy  2010   benign polyps   CORONARY ARTERY BYPASS GRAFT  1995   CORONARY STENT INTERVENTION N/A 02/03/2017   Procedure: Coronary Stent Intervention;  Surgeon: Alwyn Pea, MD;  Location: [facility] INVASIVE CV LAB;  Service: Cardiovascular;  Laterality: N/A;   ESOPHAGOGASTRODUODENOSCOPY  2010   normal   LEFT HEART CATH AND CORONARY ANGIOGRAPHY N/A 02/03/2017    Procedure: Left Heart Cath and Coronary Angiography;  Surgeon: Alwyn Pea, MD;  Location: [facility] INVASIVE CV LAB;  Service: Cardiovascular;  Laterality: N/A;   PERCUTANEOUS CORONARY STENT INTERVENTION (PCI-S)  2010, 2015   4 stents total    Social History   Tobacco Use   Smoking status: Former    Current packs/day: 0.00    Average packs/day: 1.5 packs/day for 29.0 years (43.5 ttl pk-yrs)    Types: Cigarettes    Start date: 12/30/1965    Quit date: 12/30/1994    Years since quitting: 28.7   Smokeless tobacco: Never   Tobacco comments:    smoking cessation materials not required  Vaping Use   Vaping status: Never Used  Substance Use Topics   Alcohol use: No    Alcohol/week: 0.0 standard drinks of alcohol   Drug use: No     Medication list has been reviewed and updated.  Current Meds  Medication Sig   allopurinol (ZYLOPRIM) 300 MG tablet TAKE 1 TABLET(300 MG) BY MOUTH DAILY   Ascorbic Acid (VITAMIN C) 1000 MG tablet Take 1,000 mg by mouth daily.   budesonide-formoterol (SYMBICORT) 160-4.5 MCG/ACT inhaler Inhale 1 puff into the lungs 2 (two) times daily. (Patient taking differently: Inhale 1 puff into the lungs as needed.)   carvedilol (COREG) 6.25 MG tablet Take 6.25 mg by mouth 2 (two) times daily with a meal.   Cholecalciferol (VITAMIN D3 PO) Take by mouth.   glucose blood (ACCU-CHEK AVIVA PLUS) test strip TEST BLOOD SUGAR EVERY DAY AS DIRECTED UP TO 4 TIMES DAILY   insulin degludec (TRESIBA) 100 UNIT/ML FlexTouch Pen 50 units every morning and 10 units every evening   Insulin Pen Needle (PEN NEEDLES 31GX5/16") 31G X 8 MM MISC 1 each by Does not apply route daily.   isosorbide mononitrate (IMDUR) 60 MG 24 hr tablet Take 60 mg by mouth daily.   potassium chloride SA (K-DUR,KLOR-CON) 20 MEQ tablet TAKE 2 TABLETS(40 MEQ) BY MOUTH DAILY (Patient taking differently: Take 40 mEq by mouth daily.)   torsemide (DEMADEX) 100 MG tablet Take by mouth 2 (two) times daily.    zinc  gluconate 50 MG tablet Take 50 mg by mouth daily.   [DISCONTINUED] atorvastatin (LIPITOR) 80 MG tablet Take 1 tablet (80 mg total) by mouth at bedtime.   [DISCONTINUED] clopidogrel (PLAVIX) 75 MG tablet TAKE 1 TABLET(75 MG) BY MOUTH DAILY   [DISCONTINUED] empagliflozin (JARDIANCE) 25 MG TABS tablet TAKE 1 TABLET(25 MG) BY MOUTH DAILY   [DISCONTINUED] glipiZIDE (GLUCOTROL XL) 10 MG 24 hr tablet Take 1 tablet (10 mg total) by mouth daily with breakfast.   [DISCONTINUED] linagliptin (TRADJENTA) 5 MG TABS tablet Take 1 tablet (5 mg total) by mouth daily.   [DISCONTINUED] losartan (COZAAR) 50 MG tablet Take 1 tablet (50 mg total) by mouth daily.   [DISCONTINUED] nitroGLYCERIN (NITROSTAT) 0.4 MG SL tablet DISSOLVE 1 TABLET UNDER THE TONGUE AS NEEDED FOR CHEST PAIN EVERY 5 MINUTES   [DISCONTINUED] pantoprazole (PROTONIX) 40 MG tablet TAKE 1 TABLET(40 MG) BY MOUTH TWICE DAILY  09/10/2023   10:32 AM 04/28/2023    8:51 AM 12/04/2022    9:09 AM 07/16/2022   10:37 AM  GAD 7 : Generalized Anxiety Score  Nervous, Anxious, on Edge 0 0 0 0  Control/stop worrying 0 0 0 0  Worry too much - different things 0 0 0 0  Trouble relaxing 0 0 0 0  Restless 0 0 0 0  Easily annoyed or irritable 0 0 0 0  Afraid - awful might happen 0 0 0 0  Total GAD 7 Score 0 0 0 0  Anxiety Difficulty Not difficult at all Not difficult at all Not difficult at all Not difficult at all       09/10/2023   10:32 AM 04/30/2023    8:27 AM 04/28/2023    8:51 AM  Depression screen PHQ 2/9  Decreased Interest 0 0 0  Down, Depressed, Hopeless 0 0 0  PHQ - 2 Score 0 0 0  Altered sleeping 0 0 0  Tired, decreased energy 0 0 0  Change in appetite 0 0 0  Feeling bad or failure about yourself  0 0 0  Trouble concentrating 0 0 0  Moving slowly or fidgety/restless 0 0 0  Suicidal thoughts 0 0 0  PHQ-9 Score 0 0 0  Difficult doing work/chores Not difficult at all Not difficult at all Not difficult at all    BP Readings from Last 3  Encounters:  09/10/23 128/74  04/28/23 104/68  12/04/22 (!) 90/58    Physical Exam Vitals and nursing note reviewed.  Constitutional:      General: He is not in acute distress.    Appearance: Normal appearance. He is well-developed.  HENT:     Head: Normocephalic and atraumatic.  Neck:     Vascular: No carotid bruit.  Cardiovascular:     Rate and Rhythm: Normal rate and regular rhythm.     Pulses: Normal pulses.  Pulmonary:     Effort: Pulmonary effort is normal. No respiratory distress.     Breath sounds: No wheezing or rhonchi.  Musculoskeletal:     Cervical back: Normal range of motion.     Right lower leg: No edema.     Left lower leg: No edema.  Lymphadenopathy:     Cervical: No cervical adenopathy.  Skin:    General: Skin is warm and dry.     Findings: No rash.  Neurological:     General: No focal deficit present.     Mental Status: He is alert and oriented to person, place, and time.  Psychiatric:        Mood and Affect: Mood normal.        Behavior: Behavior normal.     Wt Readings from Last 3 Encounters:  09/10/23 189 lb (85.7 kg)  04/30/23 180 lb (81.6 kg)  04/28/23 180 lb 12.8 oz (82 kg)    BP 128/74   Pulse 83   Ht 5\' 9"  (1.753 m)   Wt 189 lb (85.7 kg)   SpO2 96%   BMI 27.91 kg/m   Assessment and Plan:  Problem List Items Addressed This Visit       Unprioritized   Type II diabetes mellitus with complication (HCC) - Primary (Chronic)    Blood sugars stable without hypoglycemic symptoms or events. Current regimen is Tradjenta, glipizide and insulin. He is not taking Jardiance Changes made last visit are increasing Insulin to 60 units. Will refill Jardiance. Lab Results  Component Value Date  HGBA1C 8.0 (H) 04/28/2023  A1C today = 6.9       Relevant Medications   atorvastatin (LIPITOR) 80 MG tablet   empagliflozin (JARDIANCE) 25 MG TABS tablet   glipiZIDE (GLUCOTROL XL) 10 MG 24 hr tablet   linagliptin (TRADJENTA) 5 MG TABS tablet    losartan (COZAAR) 50 MG tablet   Other Relevant Orders   POCT glycosylated hemoglobin (Hb A1C) (Completed)   Polyneuropathy due to type 2 diabetes mellitus (HCC) (Chronic)    Constant pins and needles of both feet. No ulcerations or wounds Tried gabapentin briefly in the past - should try it again      Relevant Medications   atorvastatin (LIPITOR) 80 MG tablet   empagliflozin (JARDIANCE) 25 MG TABS tablet   glipiZIDE (GLUCOTROL XL) 10 MG 24 hr tablet   linagliptin (TRADJENTA) 5 MG TABS tablet   losartan (COZAAR) 50 MG tablet   PAD (peripheral artery disease) (HCC) (Chronic)    Stable without progression No leg heaviness or muscle cramps      Relevant Medications   atorvastatin (LIPITOR) 80 MG tablet   losartan (COZAAR) 50 MG tablet   nitroGLYCERIN (NITROSTAT) 0.4 MG SL tablet   Hyperlipidemia associated with type 2 diabetes mellitus (HCC) (Chronic)    LDL is  Lab Results  Component Value Date   LDLCALC 84 12/04/2022   Currently being treated with atorvastin 80 mg with good compliance and no concerns.       Relevant Medications   atorvastatin (LIPITOR) 80 MG tablet   empagliflozin (JARDIANCE) 25 MG TABS tablet   glipiZIDE (GLUCOTROL XL) 10 MG 24 hr tablet   linagliptin (TRADJENTA) 5 MG TABS tablet   losartan (COZAAR) 50 MG tablet   nitroGLYCERIN (NITROSTAT) 0.4 MG SL tablet   Essential hypertension (Chronic)    Normal exam with stable BP on losartan, torsemide and Coreg No concerns or side effects to current medication. No change in regimen; continue low sodium diet.       Relevant Medications   atorvastatin (LIPITOR) 80 MG tablet   losartan (COZAAR) 50 MG tablet   nitroGLYCERIN (NITROSTAT) 0.4 MG SL tablet   Coronary artery disease involving native coronary artery of native heart with angina pectoris (HCC) (Chronic)    Stable angina with exertion;  BP controlled Symptoms resolved with rest or NTG      Relevant Medications   atorvastatin (LIPITOR) 80 MG  tablet   clopidogrel (PLAVIX) 75 MG tablet   losartan (COZAAR) 50 MG tablet   nitroGLYCERIN (NITROSTAT) 0.4 MG SL tablet   CKD stage 3 secondary to diabetes (HCC) (Chronic)    Patient encouraged to avoid all NSAIDS; he can take tylenol as needed For back pain and foot pain I recommend gabapentin - he will consider it.      Relevant Medications   atorvastatin (LIPITOR) 80 MG tablet   empagliflozin (JARDIANCE) 25 MG TABS tablet   glipiZIDE (GLUCOTROL XL) 10 MG 24 hr tablet   linagliptin (TRADJENTA) 5 MG TABS tablet   losartan (COZAAR) 50 MG tablet   Chronic GERD (Chronic)   Relevant Medications   pantoprazole (PROTONIX) 40 MG tablet   CHF (congestive heart failure) (HCC) (Chronic)   Relevant Medications   atorvastatin (LIPITOR) 80 MG tablet   losartan (COZAAR) 50 MG tablet   nitroGLYCERIN (NITROSTAT) 0.4 MG SL tablet   Other Visit Diagnoses     Long-term insulin use (HCC)       Need for influenza vaccination  Relevant Orders   Flu Vaccine Trivalent High Dose (Fluad) (Completed)       Return in about 3 months (around 12/10/2023) for Medicare annual.    [name], MD [facility] [place]

## 2023-09-10 NOTE — Assessment & Plan Note (Signed)
Patient encouraged to avoid all NSAIDS; he can take tylenol as needed For back pain and foot pain I recommend gabapentin - he will consider it.

## 2023-10-23 ENCOUNTER — Other Ambulatory Visit: Payer: Self-pay | Admitting: Internal Medicine

## 2023-10-23 DIAGNOSIS — E785 Hyperlipidemia, unspecified: Secondary | ICD-10-CM

## 2023-11-24 ENCOUNTER — Other Ambulatory Visit: Payer: Self-pay | Admitting: Internal Medicine

## 2023-11-24 DIAGNOSIS — M109 Gout, unspecified: Secondary | ICD-10-CM

## 2023-11-25 NOTE — Telephone Encounter (Signed)
Requested by interface surescripts. Future visit in 2 months. Requested Prescriptions  Pending Prescriptions Disp Refills   allopurinol (ZYLOPRIM) 300 MG tablet [Pharmacy Med Name: ALLOPURINOL 300MG  TABLETS] 90 tablet 0    Sig: TAKE 1 TABLET(300 MG) BY MOUTH DAILY     Endocrinology:  Gout Agents - allopurinol Failed - 11/24/2023  4:03 PM      Failed - Uric Acid in normal range and within 360 days    Uric Acid  Date Value Ref Range Status  09/28/2020 3.5 (L) 3.8 - 8.4 mg/dL Final    Comment:               Therapeutic target for gout patients: <6.0         Failed - Cr in normal range and within 360 days    Creatinine  Date Value Ref Range Status  10/11/2014 1.19 0.60 - 1.30 mg/dL Final   Creatinine, Ser  Date Value Ref Range Status  04/28/2023 1.44 (H) 0.76 - 1.27 mg/dL Final         Passed - Valid encounter within last 12 months    Recent Outpatient Visits           2 months ago Type II diabetes mellitus with complication [facility])   [place] [facility], Nyoka Cowden, MD   7 months ago Type II diabetes mellitus with complication [facility])   [place] [facility] at [facility], Nyoka Cowden, MD   11 months ago Essential hypertension   [place] [facility] at [facility], Nyoka Cowden, MD   1 year ago Community acquired pneumonia of right lower lobe of lung   [place] [facility] at [facility], Nyoka Cowden, MD   1 year ago Type II diabetes mellitus with complication [facility])   [place] [facility] at [facility], Nyoka Cowden, MD       Future Appointments             In 2 months [name] Nyoka Cowden, MD [facility] at [facility]            Passed - CBC within normal limits and completed in the last 12 months    WBC  Date Value Ref Range Status  12/04/2022 9.9 3.4 -  10.8 x10E3/uL Final  11/20/2019 8.5 4.0 - 10.5 K/uL Final   RBC  Date Value Ref Range Status  12/04/2022 4.72 4.14 - 5.80 x10E6/uL Final  11/20/2019 4.75 4.22 - 5.81 MIL/uL Final   Hemoglobin  Date Value Ref Range Status  12/04/2022 15.2 13.0 - 17.7 g/dL Final   Hematocrit  Date Value Ref Range Status  12/04/2022 44.5 37.5 - 51.0 % Final   MCHC  Date Value Ref Range Status  12/04/2022 34.2 31.5 - 35.7 g/dL Final  16/09/9603 54.0 30.0 - 36.0 g/dL Final   [facility]  Date Value Ref Range Status  12/04/2022 32.2 26.6 - 33.0 pg Final  11/20/2019 30.7 26.0 - 34.0 pg Final   MCV  Date Value Ref Range Status  12/04/2022 94 79 - 97 fL Final  10/10/2014 96 80 - 100 fL Final   No results found for: "PLTCOUNTKUC", "LABPLAT", "POCPLA" RDW  Date Value Ref Range Status  12/04/2022 14.2 11.6 - 15.4 % Final  10/10/2014 14.2 11.5 - 14.5 % Final

## 2023-12-08 ENCOUNTER — Ambulatory Visit (INDEPENDENT_AMBULATORY_CARE_PROVIDER_SITE_OTHER): Payer: 59 | Admitting: Internal Medicine

## 2023-12-08 ENCOUNTER — Encounter: Payer: Self-pay | Admitting: Internal Medicine

## 2023-12-08 VITALS — BP 132/70 | HR 100 | Temp 98.2°F | Ht 69.0 in | Wt 188.0 lb

## 2023-12-08 DIAGNOSIS — J189 Pneumonia, unspecified organism: Secondary | ICD-10-CM

## 2023-12-08 MED ORDER — PREDNISONE 10 MG PO TABS
ORAL_TABLET | ORAL | 0 refills | Status: AC
Start: 2023-12-08 — End: 2023-12-13

## 2023-12-08 MED ORDER — DOXYCYCLINE HYCLATE 100 MG PO TABS
100.0000 mg | ORAL_TABLET | Freq: Two times a day (BID) | ORAL | 0 refills | Status: AC
Start: 2023-12-08 — End: 2023-12-18

## 2023-12-08 NOTE — Progress Notes (Signed)
Date:  12/08/2023   Name:  Gary Gutierrez   DOB:  12/31/1950   MRN:  [id number]   Chief Complaint: Cough (Started 1-2 weeks ago. Cough- clear production. SOB. )  Cough This is a chronic problem. The current episode started 1 to 4 weeks ago. The problem occurs every few minutes. The cough is Productive of sputum. Associated symptoms include ear congestion, nasal congestion and shortness of breath. Pertinent negatives include no chest pain, chills, fever, postnasal drip or wheezing. The symptoms are aggravated by exercise and lying down. He has tried a beta-agonist inhaler and OTC cough suppressant for the symptoms. The treatment provided no relief.    Review of Systems  Constitutional:  Negative for chills, fever and unexpected weight change.  HENT:  Negative for postnasal drip and trouble swallowing.   Respiratory:  Positive for cough and shortness of breath. Negative for chest tightness and wheezing.   Cardiovascular:  Negative for chest pain and palpitations.  Psychiatric/Behavioral:  Negative for sleep disturbance. The patient is not nervous/anxious.      Lab Results  Component Value Date   NA 135 04/28/2023   K 4.6 04/28/2023   CO2 20 04/28/2023   GLUCOSE 236 (H) 04/28/2023   BUN 42 (H) 04/28/2023   CREATININE 1.44 (H) 04/28/2023   CALCIUM 9.5 04/28/2023   EGFR 51 (L) 04/28/2023   GFRNONAA 47 (L) 02/14/2021   Lab Results  Component Value Date   CHOL 146 12/04/2022   HDL 36 (L) 12/04/2022   LDLCALC 84 12/04/2022   TRIG 149 12/04/2022   CHOLHDL 4.1 12/04/2022   Lab Results  Component Value Date   TSH 2.85 10/11/2014   Lab Results  Component Value Date   HGBA1C 6.9 (A) 09/10/2023   Lab Results  Component Value Date   WBC 9.9 12/04/2022   HGB 15.2 12/04/2022   HCT 44.5 12/04/2022   MCV 94 12/04/2022   PLT 225 12/04/2022   Lab Results  Component Value Date   ALT 18 12/04/2022   AST 20 12/04/2022   ALKPHOS 139 (H) 12/04/2022   BILITOT 0.7 12/04/2022    No results found for: "25OHVITD2", "25OHVITD3", "VD25OH"   Patient Active Problem List   Diagnosis Date Noted   Background diabetic retinopathy associated with type 2 diabetes mellitus (HCC) 05/07/2022   CKD stage 3 secondary to diabetes (HCC) 07/06/2020   Thoracic aortic atherosclerosis (HCC) 07/05/2020   Type II diabetes mellitus with complication (HCC) 10/27/2018   CHF (congestive heart failure) (HCC) 10/29/2017   Polyneuropathy due to type 2 diabetes mellitus (HCC) 07/01/2017   PAD (peripheral artery disease) (HCC) 06/30/2017   Tobacco use disorder, mild, in sustained remission, abuse 04/10/2017   Hx of colonic polyps 04/10/2016   Hearing loss of both ears 01/03/2016   Chronic GERD 06/09/2015   Hyperlipidemia associated with type 2 diabetes mellitus (HCC) 06/09/2015   Essential hypertension 06/09/2015   Gouty arthritis of both feet 06/09/2015   Multilevel degenerative disc disease 06/09/2015   Coronary artery disease involving native coronary artery of native heart with angina pectoris (HCC) 05/09/2014    Allergies  Allergen Reactions   Entresto [Sacubitril-Valsartan] Other (See Comments)    Low blood pressure   Penicillins Other (See Comments)    Other reaction(s): Unknown, pt does not know reactions    Past Surgical History:  Procedure Laterality Date   APPENDECTOMY     colonscopy  2010   benign polyps   CORONARY ARTERY BYPASS GRAFT  1995  CORONARY STENT INTERVENTION N/A 02/03/2017   Procedure: Coronary Stent Intervention;  Surgeon: Alwyn Pea, MD;  Location: [facility] INVASIVE CV LAB;  Service: Cardiovascular;  Laterality: N/A;   ESOPHAGOGASTRODUODENOSCOPY  2010   normal   LEFT HEART CATH AND CORONARY ANGIOGRAPHY N/A 02/03/2017   Procedure: Left Heart Cath and Coronary Angiography;  Surgeon: Alwyn Pea, MD;  Location: [facility] INVASIVE CV LAB;  Service: Cardiovascular;  Laterality: N/A;   PERCUTANEOUS CORONARY STENT INTERVENTION (PCI-S)  2010, 2015   4 stents  total    Social History   Tobacco Use   Smoking status: Former    Current packs/day: 0.00    Average packs/day: 1.5 packs/day for 29.0 years (43.5 ttl pk-yrs)    Types: Cigarettes    Start date: 12/30/1965    Quit date: 12/30/1994    Years since quitting: 28.9   Smokeless tobacco: Never   Tobacco comments:    smoking cessation materials not required  Vaping Use   Vaping status: Never Used  Substance Use Topics   Alcohol use: No    Alcohol/week: 0.0 standard drinks of alcohol   Drug use: No     Medication list has been reviewed and updated.  Current Meds  Medication Sig   doxycycline (VIBRA-TABS) 100 MG tablet Take 1 tablet (100 mg total) by mouth 2 (two) times daily for 10 days.   predniSONE (DELTASONE) 10 MG tablet Take 6 tablets (60 mg total) by mouth daily with breakfast for 1 day, THEN 5 tablets (50 mg total) daily with breakfast for 1 day, THEN 4 tablets (40 mg total) daily with breakfast for 1 day, THEN 3 tablets (30 mg total) daily with breakfast for 1 day, THEN 2 tablets (20 mg total) daily with breakfast for 1 day, THEN 1 tablet (10 mg total) daily with breakfast for 1 day.       12/08/2023   10:22 AM 12/08/2023   10:13 AM 09/10/2023   10:32 AM 04/28/2023    8:51 AM  GAD 7 : Generalized Anxiety Score  Nervous, Anxious, on Edge 0 0 0 0  Control/stop worrying 0 0 0 0  Worry too much - different things 0 0 0 0  Trouble relaxing 0 0 0 0  Restless 0 0 0 0  Easily annoyed or irritable 0 0 0 0  Afraid - awful might happen 0 0 0 0  Total GAD 7 Score 0 0 0 0  Anxiety Difficulty Not difficult at all Not difficult at all Not difficult at all Not difficult at all       12/08/2023   10:22 AM 12/08/2023   10:13 AM 09/10/2023   10:32 AM  Depression screen PHQ 2/9  Decreased Interest 0 0 0  Down, Depressed, Hopeless 0 0 0  PHQ - 2 Score 0 0 0  Altered sleeping 0 0 0  Tired, decreased energy 0 0 0  Change in appetite 0 0 0  Feeling bad or failure about yourself  0 0 0   Trouble concentrating 0 0 0  Moving slowly or fidgety/restless 0 0 0  Suicidal thoughts 0 0 0  PHQ-9 Score 0 0 0  Difficult doing work/chores Not difficult at all Not difficult at all Not difficult at all    BP Readings from Last 3 Encounters:  12/08/23 132/70  09/10/23 128/74  04/28/23 104/68    Physical Exam Vitals and nursing note reviewed.  Constitutional:      General: He is not in acute distress.  Appearance: Normal appearance. He is well-developed.  HENT:     Head: Normocephalic and atraumatic.  Cardiovascular:     Rate and Rhythm: Normal rate and regular rhythm.     Heart sounds: No murmur heard. Pulmonary:     Effort: Pulmonary effort is normal. No respiratory distress.     Breath sounds: No wheezing or rhonchi.  Musculoskeletal:     Cervical back: Normal range of motion.     Right lower leg: No edema.     Left lower leg: No edema.  Lymphadenopathy:     Cervical: No cervical adenopathy.  Skin:    General: Skin is warm and dry.     Findings: No rash.  Neurological:     Mental Status: He is alert and oriented to person, place, and time.  Psychiatric:        Mood and Affect: Mood normal.        Behavior: Behavior normal.     Wt Readings from Last 3 Encounters:  12/08/23 188 lb (85.3 kg)  09/10/23 189 lb (85.7 kg)  04/30/23 180 lb (81.6 kg)    BP 132/70   Pulse 100   Temp 98.2 F (36.8 C) (Oral)   Ht 5\' 9"  (1.753 m)   Wt 188 lb (85.3 kg)   SpO2 97%   BMI 27.76 kg/m   Assessment and Plan:  Problem List Items Addressed This Visit   None Visit Diagnoses     Atypical pneumonia    -  Primary   increase symbicort to bid add Delsym over the counter  Push fluids   Relevant Medications   doxycycline (VIBRA-TABS) 100 MG tablet   predniSONE (DELTASONE) 10 MG tablet       No follow-ups on file.    [name], MD [facility] [place]

## 2023-12-08 NOTE — Patient Instructions (Addendum)
Delsym over the counter cough syrup.  Use symbicort inhaler twice a day.  Rinse your mouth afterwards.

## 2023-12-20 ENCOUNTER — Other Ambulatory Visit: Payer: Self-pay | Admitting: Internal Medicine

## 2023-12-20 DIAGNOSIS — E118 Type 2 diabetes mellitus with unspecified complications: Secondary | ICD-10-CM

## 2023-12-22 ENCOUNTER — Telehealth: Payer: Self-pay | Admitting: Internal Medicine

## 2023-12-22 ENCOUNTER — Ambulatory Visit: Payer: Self-pay

## 2023-12-22 NOTE — Telephone Encounter (Signed)
Refill sent this morning.  - Britini Garcilazo

## 2023-12-22 NOTE — Telephone Encounter (Signed)
Summary: elevated sugar 290   Pt stated his sugar has been about 290 due to being out of his TRADJENTA 5 MG TABS tablet. Stated his pharmacy is telling him the medication is not being approved; however, I explained to the patient the only request I see is from 12/21, and it is still pending.   Seeking clinical advice.         Chief Complaint: Pt. Calling for refill on Tradjenta. Refill done for pt. Also states he is still coughing from when he was seen 12/08/23. Asking for additional medication. Symptoms: Cough Frequency: 12/08/23 Pertinent Negatives: Patient denies  Disposition: [] ED /[] Urgent Care (no appt availability in office) / [] Appointment(In office/virtual)/ []  [place] [facility]/ [] Home Care/ [] Refused Recommended Disposition /[] [place] [facility]/ [x]  Follow-up with PCP Additional Notes: Please advise pt.  Reason for Disposition  [1] Caller requesting a prescription renewal (no refills left), no triage required, AND [2] triager able to renew prescription per department policy  Answer Assessment - Initial Assessment Questions 1. DRUG NAME: "What medicine do you need to have refilled?"     Tradjenta 2. REFILLS REMAINING: "How many refills are remaining?" (Note: The label on the medicine or pill bottle will show how many refills are remaining. If there are no refills remaining, then a renewal may be needed.)     0 3. EXPIRATION DATE: "What is the expiration date?" (Note: The label states when the prescription will expire, and thus can no longer be refilled.)     N/a 4. PRESCRIBING HCP: "Who prescribed it?" Reason: If prescribed by specialist, call should be referred to that group.     Dr. [name] 5. SYMPTOMS: "Do you have any symptoms?"     N/a 6. PREGNANCY: "Is there any chance that you are pregnant?" "When was your last menstrual period?"     N/a  Protocols used: Medication Refill and Renewal Call-A-AH

## 2023-12-22 NOTE — Telephone Encounter (Signed)
Requested Prescriptions  Pending Prescriptions Disp Refills   TRADJENTA 5 MG TABS tablet [Pharmacy Med Name: TRADJENTA 5MG  TABLETS] 90 tablet 1    Sig: TAKE 1 TABLET(5 MG) BY MOUTH DAILY     Endocrinology:  Diabetes - DPP-4 Inhibitors - linagliptin Passed - 12/22/2023 11:34 AM      Passed - HBA1C is between 0 and 7.9 and within 180 days    Hemoglobin A1C  Date Value Ref Range Status  09/10/2023 6.9 (A) 4.0 - 5.6 % Final  10/11/2014 6.9 (H) 4.2 - 6.3 % Final    Comment:    The American Diabetes Association recommends that a primary goal of therapy should be <7% and that physicians should reevaluate the treatment regimen in patients with HbA1c values consistently >8%.    Hgb A1c MFr Bld  Date Value Ref Range Status  04/28/2023 8.0 (H) 4.8 - 5.6 % Final    Comment:             Prediabetes: 5.7 - 6.4          Diabetes: >6.4          Glycemic control for adults with diabetes: <7.0          Passed - Valid encounter within last 6 months    Recent Outpatient Visits           2 weeks ago Atypical pneumonia   [place] [facility] at [facility], Nyoka Cowden, MD   3 months ago Type II diabetes mellitus with complication [facility])   [place] [facility] at [facility], Nyoka Cowden, MD   7 months ago Type II diabetes mellitus with complication [facility])   [place] [facility] at [facility], Nyoka Cowden, MD   1 year ago Essential hypertension   [place] [facility] at [facility], Nyoka Cowden, MD   1 year ago Community acquired pneumonia of right lower lobe of lung   [facility] at [facility], Nyoka Cowden, MD       Future Appointments             In 1 month [name], Nyoka Cowden, MD [facility] at [facility], [facility]

## 2023-12-22 NOTE — Telephone Encounter (Signed)
Medication Refill -  Most Recent Primary Care Visit:  Provider: [name]  Department: [facility] [place]  Visit Type: OFFICE VISIT  Date: 12/08/2023  Medication: TRADJENTA 5 MG TABS tablet   Has the patient contacted their pharmacy? Yes  (Agent: If yes, when and what did the pharmacy advise?)  Is this the correct pharmacy for this prescription? Yes If no, delete pharmacy and type the correct one.  This is the patient's preferred pharmacy:  [id number], [place] - [address] AT [address] [place] [address] [place] [address] [place] [address] Phone: [phone] Fax: [phone]   Has the prescription been filled recently? Yes  Is the patient out of the medication? Yes  Has the patient been seen for an appointment in the last year OR does the patient have an upcoming appointment? Yes  Can we respond through MyChart? Yes  Agent: Please be advised that Rx refills may take up to 3 business days. We ask that you follow-up with your pharmacy.

## 2023-12-22 NOTE — Telephone Encounter (Signed)
 Medication refilled today in a separate refill encounter.

## 2024-01-05 ENCOUNTER — Other Ambulatory Visit: Payer: Self-pay

## 2024-01-05 ENCOUNTER — Other Ambulatory Visit: Payer: Self-pay | Admitting: Internal Medicine

## 2024-01-05 ENCOUNTER — Ambulatory Visit: Payer: Self-pay

## 2024-01-05 DIAGNOSIS — E118 Type 2 diabetes mellitus with unspecified complications: Secondary | ICD-10-CM

## 2024-01-05 MED ORDER — GLIPIZIDE ER 10 MG PO TB24
10.0000 mg | ORAL_TABLET | Freq: Every day | ORAL | 1 refills | Status: AC
Start: 2024-01-05 — End: ?

## 2024-01-05 NOTE — Telephone Encounter (Signed)
    Chief Complaint: Pt. Requesting refills - Tresiba  refilled, Glipizide  states printed Please refill. Symptoms: Above Frequency:  Pertinent Negatives: Patient denies  Disposition: [] ED /[] Urgent Care (no appt availability in office) / [] Appointment(In office/virtual)/ []  [place] [facility]/ [] Home Care/ [] Refused Recommended Disposition /[] [place] [facility]/ [x]  Follow-up with PCP Additional Notes: Please refill glipizide .  Reason for Disposition  [1] Caller has NON-URGENT medicine question about med that PCP prescribed AND [2] triager unable to answer question  Protocols used: Medication Refill and Renewal Call-A-AH

## 2024-01-05 NOTE — Telephone Encounter (Signed)
 Requested Prescriptions  Pending Prescriptions Disp Refills   insulin  degludec (TRESIBA  FLEXTOUCH) 100 UNIT/ML FlexTouch Pen [Pharmacy Med Name: TRESIBA  FLEXTOUCH PEN ([id number]] 15 mL 3    Sig: INJECT 50 UNITS INTO THE SKIN EVERY MORNING AND 10 UNITS EVERY EVENING     Endocrinology:  Diabetes - Insulins Passed - 01/05/2024 12:57 PM      Passed - HBA1C is between 0 and 7.9 and within 180 days    Hemoglobin A1C  Date Value Ref Range Status  09/10/2023 6.9 (A) 4.0 - 5.6 % Final  10/11/2014 6.9 (H) 4.2 - 6.3 % Final    Comment:    The American Diabetes Association recommends that a primary goal of therapy should be <7% and that physicians should reevaluate the treatment regimen in patients with HbA1c values consistently >8%.    Hgb A1c MFr Bld  Date Value Ref Range Status  04/28/2023 8.0 (H) 4.8 - 5.6 % Final    Comment:             Prediabetes: 5.7 - 6.4          Diabetes: >6.4          Glycemic control for adults with diabetes: <7.0          Passed - Valid encounter within last 6 months    Recent Outpatient Visits           4 weeks ago Atypical pneumonia   [place] [facility] at [facility], Leita DEL, MD   3 months ago Type II diabetes mellitus with complication [facility])   [place] [facility] at [facility], Leita DEL, MD   8 months ago Type II diabetes mellitus with complication [facility])   [place] [facility] at [facility], Leita DEL, MD   1 year ago Essential hypertension   [place] [facility] at [facility], Leita DEL, MD   1 year ago Community acquired pneumonia of right lower lobe of lung   [facility] at [facility], Leita DEL, MD       Future Appointments             In 1 month Justus, Leita DEL, MD [facility] at [facility], [facility]

## 2024-01-05 NOTE — Telephone Encounter (Signed)
 Summary: sugars have been very high at 270-290   Pt stated his sugars have been very high at 270-290. Denied symptoms.  Pt stated this has been due to not being able to get his medication. Medication: insulin  degludec (TRESIBA ) 100 UNIT/ML FlexTouch Pen, glipiZIDE  (GLUCOTROL  XL) 10 MG 24 hr tabletPt is frustrated that lately he has been struggling so much to be able to get his medication.  FYI, an NT med refill request was sent.     Answer Assessment - Initial Assessment Questions 1. DRUG NAME: What medicine do you need to have refilled?     Tresiba , Glipizide  2. REFILLS REMAINING: How many refills are remaining? (Note: The label on the medicine or pill bottle will show how many refills are remaining. If there are no refills remaining, then a renewal may be needed.)     0 3. EXPIRATION DATE: What is the expiration date? (Note: The label states when the prescription will expire, and thus can no longer be refilled.)     N/a 4. PRESCRIBING HCP: Who prescribed it? Reason: If prescribed by specialist, call should be referred to that group.     Dr. Justus 5. SYMPTOMS: Do you have any symptoms?     N/a 6. PREGNANCY: Is there any chance that you are pregnant? When was your last menstrual period?     N/a  Protocols used: Medication Refill and Renewal Call-A-AH

## 2024-01-05 NOTE — Telephone Encounter (Signed)
 Medication Refill -  Most Recent Primary Care Visit:  Provider: BERGLUND, [name]  Department: [facility] [place]  Visit Type: OFFICE VISIT  Date: 12/08/2023  Medication: insulin  degludec (TRESIBA ) 100 UNIT/ML FlexTouch Pen , glipiZIDE  (GLUCOTROL  XL) 10 MG 24 hr tablet   Has the patient contacted their pharmacy? Yes  (Agent: If yes, when and what did the pharmacy advise?)  Is this the correct pharmacy for this prescription? Yes If no, delete pharmacy and type the correct one.  This is the patient's preferred pharmacy:  [facility] #[id number] [place] - [address] AT [facility] [address] [place] [address] [place] [address] Phone: [phone] Fax: [phone]   Has the prescription been filled recently? Yes  Is the patient out of the medication? Yes  Has the patient been seen for an appointment in the last year OR does the patient have an upcoming appointment? Yes  Can we respond through MyChart? Yes  Agent: Please be advised that Rx refills may take up to 3 business days. We ask that you follow-up with your pharmacy.

## 2024-01-07 NOTE — Telephone Encounter (Signed)
 [facility] called and spoke to Thomson, Pensions Consultant about the refill(s) glipizide  and tresiba  requested. Advised it was sent on 01/05/24. She says the patient picked up the glipizide  and the tresiba  has a insurance issue. She transferred me to e pharmacist who says that the issue is it was filled at another location and saying too soon to refill, so he will reach out to the patient to find out where it was sent and if he will go there to get it.

## 2024-02-02 ENCOUNTER — Telehealth: Payer: Self-pay | Admitting: Internal Medicine

## 2024-02-02 NOTE — Telephone Encounter (Signed)
Copied from CRM [id number]. Topic: General - Other >> Feb 02, 2024  3:21 PM Everette C wrote: Reason for CRM: [employer] [employer] has called for an update on a previously submitted request for a continuous glucose monitoring device   Please contact Abby when possible at [phone]

## 2024-02-04 ENCOUNTER — Other Ambulatory Visit: Payer: Self-pay | Admitting: Internal Medicine

## 2024-02-04 NOTE — Progress Notes (Unsigned)
 Date:  02/04/2024   Name:  Gary Gutierrez   DOB:  [birth year]   MRN:  [id number]   Chief Complaint: No chief complaint on file.  HPI  Review of Systems   Lab Results  Component Value Date   NA 135 04/28/2023   K 4.6 04/28/2023   CO2 20 04/28/2023   GLUCOSE 236 (H) 04/28/2023   BUN 42 (H) 04/28/2023   CREATININE 1.44 (H) 04/28/2023   CALCIUM  9.5 04/28/2023   EGFR 51 (L) 04/28/2023   GFRNONAA 47 (L) 02/14/2021   Lab Results  Component Value Date   CHOL 146 12/04/2022   HDL 36 (L) 12/04/2022   LDLCALC 84 12/04/2022   TRIG 149 12/04/2022   CHOLHDL 4.1 12/04/2022   Lab Results  Component Value Date   TSH 2.85 10/11/2014   Lab Results  Component Value Date   HGBA1C 6.9 (A) 09/10/2023   Lab Results  Component Value Date   WBC 9.9 12/04/2022   HGB 15.2 12/04/2022   HCT 44.5 12/04/2022   MCV 94 12/04/2022   PLT 225 12/04/2022   Lab Results  Component Value Date   ALT 18 12/04/2022   AST 20 12/04/2022   ALKPHOS 139 (H) 12/04/2022   BILITOT 0.7 12/04/2022   No results found for: MARIEN BOLLS, VD25OH   Patient Active Problem List   Diagnosis Date Noted   Background diabetic retinopathy associated with type 2 diabetes mellitus (HCC) 05/07/2022   CKD stage 3 secondary to diabetes (HCC) 07/06/2020   Thoracic aortic atherosclerosis (HCC) 07/05/2020   Type II diabetes mellitus with complication (HCC) 10/27/2018   CHF (congestive heart failure) (HCC) 10/29/2017   Polyneuropathy due to type 2 diabetes mellitus (HCC) 07/01/2017   PAD (peripheral artery disease) (HCC) 06/30/2017   Tobacco use disorder, mild, in sustained remission, abuse 04/10/2017   Hx of colonic polyps 04/10/2016   Hearing loss of both ears 01/03/2016   Chronic GERD 06/09/2015   Hyperlipidemia associated with type 2 diabetes mellitus (HCC) 06/09/2015   Essential hypertension 06/09/2015   Gouty arthritis of both feet 06/09/2015   Multilevel degenerative disc disease 06/09/2015    Coronary artery disease involving native coronary artery of native heart with angina pectoris (HCC) 05/09/2014    Allergies  Allergen Reactions   Entresto  [Sacubitril -Valsartan ] Other (See Comments)    Low blood pressure   Penicillins Other (See Comments)    Other reaction(s): Unknown, pt does not know reactions    Past Surgical History:  Procedure Laterality Date   APPENDECTOMY     colonscopy  2010   benign polyps   CORONARY ARTERY BYPASS GRAFT  1995   CORONARY STENT INTERVENTION N/A 02/03/2017   Procedure: Coronary Stent Intervention;  Surgeon: Cara JONETTA Lovelace, MD;  Location: [facility] INVASIVE CV LAB;  Service: Cardiovascular;  Laterality: N/A;   ESOPHAGOGASTRODUODENOSCOPY  2010   normal   LEFT HEART CATH AND CORONARY ANGIOGRAPHY N/A 02/03/2017   Procedure: Left Heart Cath and Coronary Angiography;  Surgeon: Cara JONETTA Lovelace, MD;  Location: [facility] INVASIVE CV LAB;  Service: Cardiovascular;  Laterality: N/A;   PERCUTANEOUS CORONARY STENT INTERVENTION (PCI-S)  2010, 2015   4 stents total    Social History   Tobacco Use   Smoking status: Former    Current packs/day: 0.00    Average packs/day: 1.5 packs/day for 29.0 years (43.5 ttl pk-yrs)    Types: Cigarettes    Start date: 12/30/1965    Quit date: 12/30/1994    Years since  quitting: 29.1   Smokeless tobacco: Never   Tobacco comments:    smoking cessation materials not required  Vaping Use   Vaping status: Never Used  Substance Use Topics   Alcohol use: No    Alcohol/week: 0.0 standard drinks of alcohol   Drug use: No     Medication list has been reviewed and updated.  No outpatient medications have been marked as taking for the 02/04/24 encounter (Orders Only) with Justus Leita DEL, MD.       12/08/2023   10:22 AM 12/08/2023   10:13 AM 09/10/2023   10:32 AM 04/28/2023    8:51 AM  GAD 7 : Generalized Anxiety Score  Nervous, Anxious, on Edge 0 0 0 0  Control/stop worrying 0 0 0 0  Worry too much - different things 0 0 0 0   Trouble relaxing 0 0 0 0  Restless 0 0 0 0  Easily annoyed or irritable 0 0 0 0  Afraid - awful might happen 0 0 0 0  Total GAD 7 Score 0 0 0 0  Anxiety Difficulty Not difficult at all Not difficult at all Not difficult at all Not difficult at all       12/08/2023   10:22 AM 12/08/2023   10:13 AM 09/10/2023   10:32 AM  Depression screen PHQ 2/9  Decreased Interest 0 0 0  Down, Depressed, Hopeless 0 0 0  PHQ - 2 Score 0 0 0  Altered sleeping 0 0 0  Tired, decreased energy 0 0 0  Change in appetite 0 0 0  Feeling bad or failure about yourself  0 0 0  Trouble concentrating 0 0 0  Moving slowly or fidgety/restless 0 0 0  Suicidal thoughts 0 0 0  PHQ-9 Score 0 0 0  Difficult doing work/chores Not difficult at all Not difficult at all Not difficult at all    BP Readings from Last 3 Encounters:  12/08/23 132/70  09/10/23 128/74  04/28/23 104/68    Physical Exam  Wt Readings from Last 3 Encounters:  12/08/23 188 lb (85.3 kg)  09/10/23 189 lb (85.7 kg)  04/30/23 180 lb (81.6 kg)    There were no vitals taken for this visit.  Assessment and Plan:  Problem List Items Addressed This Visit   None   No follow-ups on file.    Leita HILARIO Justus, MD [facility] [place]

## 2024-02-05 NOTE — Telephone Encounter (Signed)
 Called patient and left VM to call back.  - Gary Gutierrez

## 2024-02-12 ENCOUNTER — Encounter: Payer: Self-pay | Admitting: Internal Medicine

## 2024-02-12 ENCOUNTER — Ambulatory Visit: Payer: 59 | Admitting: Internal Medicine

## 2024-02-12 ENCOUNTER — Other Ambulatory Visit: Payer: Self-pay | Admitting: Internal Medicine

## 2024-02-12 VITALS — BP 100/58 | HR 86 | Ht 69.0 in | Wt 192.0 lb

## 2024-02-12 DIAGNOSIS — E1142 Type 2 diabetes mellitus with diabetic polyneuropathy: Secondary | ICD-10-CM

## 2024-02-12 DIAGNOSIS — I10 Essential (primary) hypertension: Secondary | ICD-10-CM

## 2024-02-12 DIAGNOSIS — E118 Type 2 diabetes mellitus with unspecified complications: Secondary | ICD-10-CM

## 2024-02-12 DIAGNOSIS — E1122 Type 2 diabetes mellitus with diabetic chronic kidney disease: Secondary | ICD-10-CM | POA: Diagnosis not present

## 2024-02-12 DIAGNOSIS — E785 Hyperlipidemia, unspecified: Secondary | ICD-10-CM

## 2024-02-12 DIAGNOSIS — I25119 Atherosclerotic heart disease of native coronary artery with unspecified angina pectoris: Secondary | ICD-10-CM | POA: Diagnosis not present

## 2024-02-12 DIAGNOSIS — E1169 Type 2 diabetes mellitus with other specified complication: Secondary | ICD-10-CM

## 2024-02-12 DIAGNOSIS — Z0000 Encounter for general adult medical examination without abnormal findings: Secondary | ICD-10-CM | POA: Diagnosis not present

## 2024-02-12 DIAGNOSIS — Z7984 Long term (current) use of oral hypoglycemic drugs: Secondary | ICD-10-CM | POA: Diagnosis not present

## 2024-02-12 DIAGNOSIS — N183 Chronic kidney disease, stage 3 unspecified: Secondary | ICD-10-CM

## 2024-02-12 DIAGNOSIS — I5022 Chronic systolic (congestive) heart failure: Secondary | ICD-10-CM | POA: Diagnosis not present

## 2024-02-12 MED ORDER — PREGABALIN 50 MG PO CAPS: 50.0000 mg | ORAL_CAPSULE | Freq: Two times a day (BID) | ORAL | 0 refills | Status: DC

## 2024-02-12 MED ORDER — DEXCOM G7 SENSOR MISC: 3 refills | Status: DC

## 2024-02-12 NOTE — Assessment & Plan Note (Addendum)
Blood sugars stable without hypoglycemic symptoms or events. Currently managed with Insulin, Jardinace, glipizide and Tradjenta.  He intermittently has issues getting the medications from the pharmacy due to various excuses. Changes made last visit are none. Lab Results  Component Value Date   HGBA1C 6.9 (A) 09/10/2023

## 2024-02-12 NOTE — Assessment & Plan Note (Signed)
He had no benefit from gabapentin. Will try Lyrica 50 mg bid

## 2024-02-12 NOTE — Assessment & Plan Note (Signed)
LDL is  Lab Results  Component Value Date   LDLCALC 84 12/04/2022   Current regimen is atorvastatin 80 mg.  Tolerating medications well without issues. Consider adding Repatha

## 2024-02-12 NOTE — Assessment & Plan Note (Signed)
Stable angina; Continue follow up with cardiology On Entresto Lab Results  Component Value Date   LDLCALC 84 12/04/2022  On atorvastatin 80 mg - consider Repatha

## 2024-02-12 NOTE — Progress Notes (Signed)
Date:  02/12/2024   Name:  Gary Gutierrez   DOB:  [birth year]   MRN:  [id number]   Chief Complaint: Annual Exam Gary Gutierrez is a 73 y.o. male who presents today for his Complete Annual Exam. He feels well. He reports exercising none. He reports he is sleeping well.   Health Maintenance  Topic Date Due   Zoster (Shingles) Vaccine (1 of 2) Never done   Colon Cancer Screening  06/19/2023   Yearly kidney health urinalysis for diabetes  12/05/2023   Hemoglobin A1C  03/09/2024   Yearly kidney function blood test for diabetes  04/27/2024   Medicare Annual Wellness Visit  04/29/2024   Eye exam for diabetics  05/11/2024   Complete foot exam   02/11/2025   DTaP/Tdap/Td vaccine (3 - Td or Tdap) 04/24/2031   Pneumonia Vaccine  Completed   Flu Shot  Completed   Hepatitis C Screening  Completed   HPV Vaccine  Aged Out   COVID-19 Vaccine  Discontinued    Lab Results  Component Value Date   PSA1 2.7 06/10/2017    Hypertension This is a chronic problem. The problem is controlled. Pertinent negatives include no chest pain, headaches, palpitations or shortness of breath.  Diabetes He presents for his follow-up diabetic visit. He has type 2 diabetes mellitus. His disease course has been stable. Pertinent negatives for hypoglycemia include no headaches, nervousness/anxiousness or tremors. Pertinent negatives for diabetes include no chest pain, no fatigue, no polydipsia and no polyuria. Current diabetic treatment includes insulin injections and oral agent (triple therapy). He is compliant with treatment none of the time.  Hyperlipidemia This is a chronic problem. The problem is uncontrolled. Pertinent negatives include no chest pain or shortness of breath. Current antihyperlipidemic treatment includes statins.    Review of Systems  Constitutional:  Negative for appetite change, fatigue and unexpected weight change.  Eyes:  Negative for visual disturbance.  Respiratory:  Negative for  cough, shortness of breath and wheezing.   Cardiovascular:  Negative for chest pain, palpitations and leg swelling.  Gastrointestinal:  Negative for abdominal pain and blood in stool.  Endocrine: Negative for polydipsia and polyuria.  Genitourinary:  Negative for dysuria and hematuria.  Musculoskeletal:  Positive for arthralgias (foot pain).  Skin:  Negative for color change and rash.  Neurological:  Negative for tremors, numbness and headaches.  Psychiatric/Behavioral:  Negative for dysphoric mood and sleep disturbance. The patient is not nervous/anxious.      Lab Results  Component Value Date   NA 135 04/28/2023   K 4.6 04/28/2023   CO2 20 04/28/2023   GLUCOSE 236 (H) 04/28/2023   BUN 42 (H) 04/28/2023   CREATININE 1.44 (H) 04/28/2023   CALCIUM 9.5 04/28/2023   EGFR 51 (L) 04/28/2023   GFRNONAA 47 (L) 02/14/2021   Lab Results  Component Value Date   CHOL 146 12/04/2022   HDL 36 (L) 12/04/2022   LDLCALC 84 12/04/2022   TRIG 149 12/04/2022   CHOLHDL 4.1 12/04/2022   Lab Results  Component Value Date   TSH 2.85 10/11/2014   Lab Results  Component Value Date   HGBA1C 6.9 (A) 09/10/2023   Lab Results  Component Value Date   WBC 9.9 12/04/2022   HGB 15.2 12/04/2022   HCT 44.5 12/04/2022   MCV 94 12/04/2022   PLT 225 12/04/2022   Lab Results  Component Value Date   ALT 18 12/04/2022   AST 20 12/04/2022   ALKPHOS 139 (H)  12/04/2022   BILITOT 0.7 12/04/2022   No results found for: "25OHVITD2", "25OHVITD3", "VD25OH"   Patient Active Problem List   Diagnosis Date Noted   Background diabetic retinopathy associated with type 2 diabetes mellitus (HCC) 05/07/2022   CKD stage 3 secondary to diabetes (HCC) 07/06/2020   Thoracic aortic atherosclerosis (HCC) 07/05/2020   Type II diabetes mellitus with complication (HCC) 10/27/2018   CHF (congestive heart failure) (HCC) 10/29/2017   Polyneuropathy due to type 2 diabetes mellitus (HCC) 07/01/2017   PAD (peripheral artery  disease) (HCC) 06/30/2017   Tobacco use disorder, mild, in sustained remission, abuse 04/10/2017   Hx of colonic polyps 04/10/2016   Hearing loss of both ears 01/03/2016   Chronic GERD 06/09/2015   Hyperlipidemia associated with type 2 diabetes mellitus (HCC) 06/09/2015   Essential hypertension 06/09/2015   Gouty arthritis of both feet 06/09/2015   Multilevel degenerative disc disease 06/09/2015   Coronary artery disease involving native coronary artery of native heart with angina pectoris (HCC) 05/09/2014    Allergies  Allergen Reactions   Entresto [Sacubitril-Valsartan] Other (See Comments)    Low blood pressure   Penicillins Other (See Comments)    Other reaction(s): Unknown, pt does not know reactions    Past Surgical History:  Procedure Laterality Date   APPENDECTOMY     colonscopy  2010   benign polyps   CORONARY ARTERY BYPASS GRAFT  1995   CORONARY STENT INTERVENTION N/A 02/03/2017   Procedure: Coronary Stent Intervention;  Surgeon: Alwyn Pea, MD;  Location: [facility] INVASIVE CV LAB;  Service: Cardiovascular;  Laterality: N/A;   ESOPHAGOGASTRODUODENOSCOPY  2010   normal   LEFT HEART CATH AND CORONARY ANGIOGRAPHY N/A 02/03/2017   Procedure: Left Heart Cath and Coronary Angiography;  Surgeon: Alwyn Pea, MD;  Location: [facility] INVASIVE CV [facility];  Service: Cardiovascular;  Laterality: N/A;   PERCUTANEOUS CORONARY STENT INTERVENTION (PCI-S)  2010, 2015   4 stents total    Social History   Tobacco Use   Smoking status: Former    Current packs/day: 0.00    Average packs/day: 1.5 packs/day for 29.0 years (43.5 ttl pk-yrs)    Types: Cigarettes    Start date: 12/30/1965    Quit date: 12/30/1994    Years since quitting: 29.1   Smokeless tobacco: Never   Tobacco comments:    smoking cessation materials not required  Vaping Use   Vaping status: Never Used  Substance Use Topics   Alcohol use: No    Alcohol/week: 0.0 standard drinks of alcohol   Drug use: No      Medication list has been reviewed and updated.  Current Meds  Medication Sig   allopurinol (ZYLOPRIM) 300 MG tablet TAKE 1 TABLET(300 MG) BY MOUTH DAILY   Ascorbic Acid (VITAMIN C) 1000 MG tablet Take 1,000 mg by mouth daily.   atorvastatin (LIPITOR) 80 MG tablet TAKE 1 TABLET(80 MG) BY MOUTH AT BEDTIME   budesonide-formoterol (SYMBICORT) 160-4.5 MCG/ACT inhaler Inhale 1 puff into the lungs 2 (two) times daily. (Patient taking differently: Inhale 1 puff into the lungs as needed.)   carvedilol (COREG) 6.25 MG tablet Take 6.25 mg by mouth 2 (two) times daily with a meal.   Cholecalciferol (VITAMIN D3 PO) Take by mouth.   clopidogrel (PLAVIX) 75 MG tablet TAKE 1 TABLET(75 MG) BY MOUTH DAILY   empagliflozin (JARDIANCE) 25 MG TABS tablet TAKE 1 TABLET(25 MG) BY MOUTH DAILY   glipiZIDE (GLUCOTROL XL) 10 MG 24 hr tablet Take 1 tablet (10  mg total) by mouth daily with breakfast.   glucose blood (ACCU-CHEK AVIVA PLUS) test strip TEST BLOOD SUGAR EVERY DAY AS DIRECTED UP TO 4 TIMES DAILY   insulin degludec (TRESIBA FLEXTOUCH) 100 UNIT/ML FlexTouch Pen INJECT 50 UNITS INTO THE SKIN EVERY MORNING AND 10 UNITS EVERY EVENING (Patient taking differently: INJECT 60 UNITS at night)   Insulin Pen Needle (PEN NEEDLES 31GX5/16") 31G X 8 MM MISC 1 each by Does not apply route daily.   isosorbide mononitrate (IMDUR) 60 MG 24 hr tablet Take 30 mg by mouth daily.   losartan (COZAAR) 50 MG tablet Take 1 tablet (50 mg total) by mouth daily.   nitroGLYCERIN (NITROSTAT) 0.4 MG SL tablet Place 1 tablet (0.4 mg total) under the tongue every 5 (five) minutes as needed for chest pain.   pantoprazole (PROTONIX) 40 MG tablet TAKE 1 TABLET(40 MG) BY MOUTH TWICE DAILY   potassium chloride SA (K-DUR,KLOR-CON) 20 MEQ tablet TAKE 2 TABLETS(40 MEQ) BY MOUTH DAILY (Patient taking differently: Take 40 mEq by mouth daily.)   pregabalin (LYRICA) 50 MG capsule Take 1 capsule (50 mg total) by mouth 2 (two) times daily.   torsemide  (DEMADEX) 100 MG tablet Take by mouth 2 (two) times daily.    TRADJENTA 5 MG TABS tablet TAKE 1 TABLET(5 MG) BY MOUTH DAILY   zinc gluconate 50 MG tablet Take 50 mg by mouth daily.       02/12/2024   10:56 AM 12/08/2023   10:22 AM 12/08/2023   10:13 AM 09/10/2023   10:32 AM  GAD 7 : Generalized Anxiety Score  Nervous, Anxious, on Edge 0 0 0 0  Control/stop worrying 0 0 0 0  Worry too much - different things 0 0 0 0  Trouble relaxing 0 0 0 0  Restless 0 0 0 0  Easily annoyed or irritable 0 0 0 0  Afraid - awful might happen 0 0 0 0  Total GAD 7 Score 0 0 0 0  Anxiety Difficulty Not difficult at all Not difficult at all Not difficult at all Not difficult at all       02/12/2024   10:56 AM 12/08/2023   10:22 AM 12/08/2023   10:13 AM  Depression screen PHQ 2/9  Decreased Interest 0 0 0  Down, Depressed, Hopeless 0 0 0  PHQ - 2 Score 0 0 0  Altered sleeping  0 0  Tired, decreased energy  0 0  Change in appetite  0 0  Feeling bad or failure about yourself   0 0  Trouble concentrating  0 0  Moving slowly or fidgety/restless  0 0  Suicidal thoughts  0 0  PHQ-9 Score  0 0  Difficult doing work/chores  Not difficult at all Not difficult at all    BP Readings from Last 3 Encounters:  02/12/24 (!) 100/58  12/08/23 132/70  09/10/23 128/74    Physical Exam Vitals and nursing note reviewed.  Constitutional:      Appearance: Normal appearance. He is well-developed.  HENT:     Head: Normocephalic.     Right Ear: Tympanic membrane, ear canal and external ear normal.     Left Ear: Tympanic membrane, ear canal and external ear normal.     Nose: Nose normal.  Eyes:     Conjunctiva/sclera: Conjunctivae normal.     Pupils: Pupils are equal, round, and reactive to light.  Neck:     Thyroid: No thyromegaly.     Vascular: No carotid  bruit.  Cardiovascular:     Rate and Rhythm: Normal rate and regular rhythm.     Pulses: Normal pulses.     Heart sounds: Normal heart sounds.   Pulmonary:     Effort: Pulmonary effort is normal.     Breath sounds: Normal breath sounds. No wheezing.  Chest:  Breasts:    Right: No mass.     Left: No mass.  Abdominal:     General: Bowel sounds are normal.     Palpations: Abdomen is soft.     Tenderness: There is no abdominal tenderness.  Musculoskeletal:        General: Normal range of motion.     Cervical back: Normal range of motion and neck supple.     Right lower leg: No edema.     Left lower leg: No edema.  Lymphadenopathy:     Cervical: No cervical adenopathy.  Skin:    General: Skin is warm and dry.     Capillary Refill: Capillary refill takes less than 2 seconds.  Neurological:     General: No focal deficit present.     Mental Status: He is alert and oriented to person, place, and time.     Deep Tendon Reflexes: Reflexes are normal and symmetric.  Psychiatric:        Attention and Perception: Attention normal.        Mood and Affect: Mood normal.        Thought Content: Thought content normal.    Diabetic Foot Exam - Simple   Simple Foot Form Diabetic Foot exam was performed with the following findings: Yes 02/12/2024 11:14 AM  Visual Inspection No deformities, no ulcerations, no other skin breakdown bilaterally: Yes Sensation Testing Intact to touch and monofilament testing bilaterally: Yes Pulse Check Posterior Tibialis and Dorsalis pulse intact bilaterally: Yes Comments      Wt Readings from Last 3 Encounters:  02/12/24 192 lb (87.1 kg)  12/08/23 188 lb (85.3 kg)  09/10/23 189 lb (85.7 kg)    BP (!) 100/58   Pulse 86   Ht 5\' 9"  (1.753 m)   Wt 192 lb (87.1 kg)   SpO2 95%   BMI 28.35 kg/m   Assessment and Plan:  Problem List Items Addressed This Visit       Unprioritized   Coronary artery disease involving native coronary artery of native heart with angina pectoris (HCC) (Chronic)   Stable angina; Continue follow up with cardiology On Entresto Lab Results  Component Value Date    LDLCALC 84 12/04/2022  On atorvastatin 80 mg - consider Repatha       Relevant Orders   CBC with Differential/Platelet   TSH   Hyperlipidemia associated with type 2 diabetes mellitus (HCC) (Chronic)   LDL is  Lab Results  Component Value Date   LDLCALC 84 12/04/2022   Current regimen is atorvastatin 80 mg.  Tolerating medications well without issues. Consider adding Repatha       Relevant Orders   Lipid panel   Essential hypertension (Chronic)   Controlled BP with normal exam. Current regimen is losartan, Coreg, torsemide. Will continue same medications; encourage continued reduced sodium diet.       Polyneuropathy due to type 2 diabetes mellitus (HCC) (Chronic)   He had no benefit from gabapentin. Will try Lyrica 50 mg bid      Relevant Medications   pregabalin (LYRICA) 50 MG capsule   CHF (congestive heart failure) (HCC) (Chronic)   Type II diabetes  mellitus with complication (HCC) (Chronic)   Blood sugars stable without hypoglycemic symptoms or events. Currently managed with Insulin, Jardinace, glipizide and Tradjenta.  He intermittently has issues getting the medications from the pharmacy due to various excuses. Changes made last visit are none. Lab Results  Component Value Date   HGBA1C 6.9 (A) 09/10/2023         Relevant Orders   Comprehensive metabolic panel   Hemoglobin A1c   Microalbumin / creatinine urine ratio   CKD stage 3 secondary to diabetes (HCC) (Chronic)   Relevant Orders   Comprehensive metabolic panel   Other Visit Diagnoses       Annual physical exam    -  Primary   up to date on screening/immunizations     Long term current use of oral hypoglycemic drug           Return in about 4 months (around 06/11/2024) for DM, HTN.    [name], MD [facility] [place]

## 2024-02-12 NOTE — Progress Notes (Unsigned)
Date:  02/12/2024   Name:  Gary Gutierrez   DOB:  [birth year]   MRN:  [id number]   Chief Complaint: No chief complaint on file.  HPI  Review of Systems   Lab Results  Component Value Date   NA 135 04/28/2023   K 4.6 04/28/2023   CO2 20 04/28/2023   GLUCOSE 236 (H) 04/28/2023   BUN 42 (H) 04/28/2023   CREATININE 1.44 (H) 04/28/2023   CALCIUM 9.5 04/28/2023   EGFR 51 (L) 04/28/2023   GFRNONAA 47 (L) 02/14/2021   Lab Results  Component Value Date   CHOL 146 12/04/2022   HDL 36 (L) 12/04/2022   LDLCALC 84 12/04/2022   TRIG 149 12/04/2022   CHOLHDL 4.1 12/04/2022   Lab Results  Component Value Date   TSH 2.85 10/11/2014   Lab Results  Component Value Date   HGBA1C 6.9 (A) 09/10/2023   Lab Results  Component Value Date   WBC 9.9 12/04/2022   HGB 15.2 12/04/2022   HCT 44.5 12/04/2022   MCV 94 12/04/2022   PLT 225 12/04/2022   Lab Results  Component Value Date   ALT 18 12/04/2022   AST 20 12/04/2022   ALKPHOS 139 (H) 12/04/2022   BILITOT 0.7 12/04/2022   No results found for: "25OHVITD2", "25OHVITD3", "VD25OH"   Patient Active Problem List   Diagnosis Date Noted   Background diabetic retinopathy associated with type 2 diabetes mellitus (HCC) 05/07/2022   CKD stage 3 secondary to diabetes (HCC) 07/06/2020   Thoracic aortic atherosclerosis (HCC) 07/05/2020   Type II diabetes mellitus with complication (HCC) 10/27/2018   CHF (congestive heart failure) (HCC) 10/29/2017   Polyneuropathy due to type 2 diabetes mellitus (HCC) 07/01/2017   PAD (peripheral artery disease) (HCC) 06/30/2017   Tobacco use disorder, mild, in sustained remission, abuse 04/10/2017   Hx of colonic polyps 04/10/2016   Hearing loss of both ears 01/03/2016   Chronic GERD 06/09/2015   Hyperlipidemia associated with type 2 diabetes mellitus (HCC) 06/09/2015   Essential hypertension 06/09/2015   Gouty arthritis of both feet 06/09/2015   Multilevel degenerative disc disease 06/09/2015    Coronary artery disease involving native coronary artery of native heart with angina pectoris (HCC) 05/09/2014    Allergies  Allergen Reactions   Entresto [Sacubitril-Valsartan] Other (See Comments)    Low blood pressure   Penicillins Other (See Comments)    Other reaction(s): Unknown, pt does not know reactions    Past Surgical History:  Procedure Laterality Date   APPENDECTOMY     colonscopy  2010   benign polyps   CORONARY ARTERY BYPASS GRAFT  1995   CORONARY STENT INTERVENTION N/A 02/03/2017   Procedure: Coronary Stent Intervention;  Surgeon: Alwyn Pea, MD;  Location: [facility] INVASIVE [facility];  Service: Cardiovascular;  Laterality: N/A;   ESOPHAGOGASTRODUODENOSCOPY  2010   normal   LEFT HEART CATH AND CORONARY ANGIOGRAPHY N/A 02/03/2017   Procedure: Left Heart Cath and Coronary Angiography;  Surgeon: Alwyn Pea, MD;  Location: [facility];  Service: Cardiovascular;  Laterality: N/A;   PERCUTANEOUS CORONARY STENT INTERVENTION (PCI-S)  2010, 2015   4 stents total    Social History   Tobacco Use   Smoking status: Former    Current packs/day: 0.00    Average packs/day: 1.5 packs/day for 29.0 years (43.5 ttl pk-yrs)    Types: Cigarettes    Start date: 12/30/1965    Quit date: 12/30/1994    Years since  quitting: 29.1   Smokeless tobacco: Never   Tobacco comments:    smoking cessation materials not required  Vaping Use   Vaping status: Never Used  Substance Use Topics   Alcohol use: No    Alcohol/week: 0.0 standard drinks of alcohol   Drug use: No     Medication list has been reviewed and updated.  No outpatient medications have been marked as taking for the 02/12/24 encounter (Orders Only) with [name], MD.       02/12/2024   10:56 AM 12/08/2023   10:22 AM 12/08/2023   10:13 AM 09/10/2023   10:32 AM  GAD 7 : Generalized Anxiety Score  Nervous, Anxious, on Edge 0 0 0 0  Control/stop worrying 0 0 0 0  Worry too much - different things 0 0 0  0  Trouble relaxing 0 0 0 0  Restless 0 0 0 0  Easily annoyed or irritable 0 0 0 0  Afraid - awful might happen 0 0 0 0  Total GAD 7 Score 0 0 0 0  Anxiety Difficulty Not difficult at all Not difficult at all Not difficult at all Not difficult at all       02/12/2024   10:56 AM 12/08/2023   10:22 AM 12/08/2023   10:13 AM  Depression screen PHQ 2/9  Decreased Interest 0 0 0  Down, Depressed, Hopeless 0 0 0  PHQ - 2 Score 0 0 0  Altered sleeping  0 0  Tired, decreased energy  0 0  Change in appetite  0 0  Feeling bad or failure about yourself   0 0  Trouble concentrating  0 0  Moving slowly or fidgety/restless  0 0  Suicidal thoughts  0 0  PHQ-9 Score  0 0  Difficult doing work/chores  Not difficult at all Not difficult at all    BP Readings from Last 3 Encounters:  02/12/24 (!) 100/58  12/08/23 132/70  09/10/23 128/74    Physical Exam  Wt Readings from Last 3 Encounters:  02/12/24 192 lb (87.1 kg)  12/08/23 188 lb (85.3 kg)  09/10/23 189 lb (85.7 kg)    There were no vitals taken for this visit.  Assessment and Plan:  Problem List Items Addressed This Visit   None   No follow-ups on file.    [name], MD [facility] [place]

## 2024-02-12 NOTE — Assessment & Plan Note (Signed)
Controlled BP with normal exam. Current regimen is losartan, Coreg, torsemide. Will continue same medications; encourage continued reduced sodium diet.

## 2024-02-13 ENCOUNTER — Other Ambulatory Visit: Payer: Self-pay | Admitting: Internal Medicine

## 2024-02-13 DIAGNOSIS — I25119 Atherosclerotic heart disease of native coronary artery with unspecified angina pectoris: Secondary | ICD-10-CM

## 2024-02-13 LAB — CBC WITH DIFFERENTIAL/PLATELET
Basophils Absolute: 0 x10E3/uL (ref 0.0–0.2)
Basos: 0 %
EOS (ABSOLUTE): 0.2 x10E3/uL (ref 0.0–0.4)
Eos: 2 %
Hematocrit: 43.4 % (ref 37.5–51.0)
Hemoglobin: 14.2 g/dL (ref 13.0–17.7)
Immature Grans (Abs): 0 x10E3/uL (ref 0.0–0.1)
Immature Granulocytes: 0 %
Lymphocytes Absolute: 2.4 x10E3/uL (ref 0.7–3.1)
Lymphs: 21 %
MCH: 31.6 pg (ref 26.6–33.0)
MCHC: 32.7 g/dL (ref 31.5–35.7)
MCV: 96 fL (ref 79–97)
Monocytes Absolute: 1.2 x10E3/uL — ABNORMAL HIGH (ref 0.1–0.9)
Monocytes: 10 %
Neutrophils Absolute: 7.7 x10E3/uL — ABNORMAL HIGH (ref 1.4–7.0)
Neutrophils: 67 %
Platelets: 203 x10E3/uL (ref 150–450)
RBC: 4.5 x10E6/uL (ref 4.14–5.80)
RDW: 13.4 % (ref 11.6–15.4)
WBC: 11.5 x10E3/uL — ABNORMAL HIGH (ref 3.4–10.8)

## 2024-02-13 LAB — COMPREHENSIVE METABOLIC PANEL WITH GFR
ALT: 20 IU/L (ref 0–44)
AST: 16 IU/L (ref 0–40)
Albumin: 4.2 g/dL (ref 3.8–4.8)
Alkaline Phosphatase: 165 IU/L — ABNORMAL HIGH (ref 44–121)
BUN/Creatinine Ratio: 26 — ABNORMAL HIGH (ref 10–24)
BUN: 42 mg/dL — ABNORMAL HIGH (ref 8–27)
Bilirubin Total: 0.8 mg/dL (ref 0.0–1.2)
CO2: 26 mmol/L (ref 20–29)
Calcium: 9.3 mg/dL (ref 8.6–10.2)
Chloride: 95 mmol/L — ABNORMAL LOW (ref 96–106)
Creatinine, Ser: 1.62 mg/dL — ABNORMAL HIGH (ref 0.76–1.27)
Globulin, Total: 2.6 g/dL (ref 1.5–4.5)
Glucose: 212 mg/dL — ABNORMAL HIGH (ref 70–99)
Potassium: 4.5 mmol/L (ref 3.5–5.2)
Sodium: 137 mmol/L (ref 134–144)
Total Protein: 6.8 g/dL (ref 6.0–8.5)
eGFR: 45 mL/min/1.73 — ABNORMAL LOW

## 2024-02-13 LAB — LIPID PANEL
Chol/HDL Ratio: 4.9 {ratio} (ref 0.0–5.0)
Cholesterol, Total: 152 mg/dL (ref 100–199)
HDL: 31 mg/dL — ABNORMAL LOW (ref >39)
LDL Chol Calc (NIH): 61 mg/dL (ref 0–99)
Triglycerides: 388 mg/dL — ABNORMAL HIGH (ref 0–149)
VLDL Cholesterol Cal: 60 mg/dL — ABNORMAL HIGH (ref 5–40)

## 2024-02-13 LAB — MICROALBUMIN / CREATININE URINE RATIO
Creatinine, Urine: 24 mg/dL
Microalb/Creat Ratio: <13 mg/g{creat} (ref 0–29)
Microalbumin, Urine: <3 ug/mL

## 2024-02-13 LAB — TSH: TSH: 4.81 u[IU]/mL — ABNORMAL HIGH (ref 0.450–4.500)

## 2024-02-13 LAB — HEMOGLOBIN A1C
Est. average glucose Bld gHb Est-mCnc: 200 mg/dL
Hgb A1c MFr Bld: 8.6 % — ABNORMAL HIGH (ref 4.8–5.6)

## 2024-02-13 MED ORDER — REPATHA SURECLICK 140 MG/ML ~~LOC~~ SOAJ: 140.0000 mg | SUBCUTANEOUS | 3 refills | Status: DC

## 2024-02-13 NOTE — Progress Notes (Unsigned)
Date:  02/13/2024   Name:  Gary Gutierrez   DOB:  [birth year]   MRN:  [id number]   Chief Complaint: No chief complaint on file.  HPI  Review of Systems   Lab Results  Component Value Date   NA 137 02/12/2024   K 4.5 02/12/2024   CO2 26 02/12/2024   GLUCOSE 212 (H) 02/12/2024   BUN 42 (H) 02/12/2024   CREATININE 1.62 (H) 02/12/2024   CALCIUM 9.3 02/12/2024   EGFR 45 (L) 02/12/2024   GFRNONAA 47 (L) 02/14/2021   Lab Results  Component Value Date   CHOL 152 02/12/2024   HDL 31 (L) 02/12/2024   LDLCALC 61 02/12/2024   TRIG 388 (H) 02/12/2024   CHOLHDL 4.9 02/12/2024   Lab Results  Component Value Date   TSH 4.810 (H) 02/12/2024   Lab Results  Component Value Date   HGBA1C 8.6 (H) 02/12/2024   Lab Results  Component Value Date   WBC 11.5 (H) 02/12/2024   HGB 14.2 02/12/2024   HCT 43.4 02/12/2024   MCV 96 02/12/2024   PLT 203 02/12/2024   Lab Results  Component Value Date   ALT 20 02/12/2024   AST 16 02/12/2024   ALKPHOS 165 (H) 02/12/2024   BILITOT 0.8 02/12/2024   No results found for: "25OHVITD2", "25OHVITD3", "VD25OH"   Patient Active Problem List   Diagnosis Date Noted   Background diabetic retinopathy associated with type 2 diabetes mellitus (HCC) 05/07/2022   CKD stage 3 secondary to diabetes (HCC) 07/06/2020   Thoracic aortic atherosclerosis (HCC) 07/05/2020   Type II diabetes mellitus with complication (HCC) 10/27/2018   CHF (congestive heart failure) (HCC) 10/29/2017   Polyneuropathy due to type 2 diabetes mellitus (HCC) 07/01/2017   PAD (peripheral artery disease) (HCC) 06/30/2017   Tobacco use disorder, mild, in sustained remission, abuse 04/10/2017   Hx of colonic polyps 04/10/2016   Hearing loss of both ears 01/03/2016   Chronic GERD 06/09/2015   Hyperlipidemia associated with type 2 diabetes mellitus (HCC) 06/09/2015   Essential hypertension 06/09/2015   Gouty arthritis of both feet 06/09/2015   Multilevel degenerative disc disease  06/09/2015   Coronary artery disease involving native coronary artery of native heart with angina pectoris (HCC) 05/09/2014    Allergies  Allergen Reactions   Entresto [Sacubitril-Valsartan] Other (See Comments)    Low blood pressure   Penicillins Other (See Comments)    Other reaction(s): Unknown, pt does not know reactions    Past Surgical History:  Procedure Laterality Date   APPENDECTOMY     colonscopy  2010   benign polyps   CORONARY ARTERY BYPASS GRAFT  1995   CORONARY STENT INTERVENTION N/A 02/03/2017   Procedure: Coronary Stent Intervention;  Surgeon: Alwyn Pea, MD;  Location: [facility] INV[facility];  Service: Cardiovascular;  Laterality: N/A;   ESOPHAGOGASTRODUODENOSCOPY  2010   normal   LEFT HEART CATH AND CORONARY ANGIOGRAPHY N/A 02/03/2017   Procedure: Left Heart Cath and Coronary Angiography;  Surgeon: Alwyn Pea, MD;  Location: [facility];  Service: Cardiovascular;  Laterality: N/A;   PERCUTANEOUS CORONARY STENT INTERVENTION (PCI-S)  2010, 2015   4 stents total    Social History   Tobacco Use   Smoking status: Former    Current packs/day: 0.00    Average packs/day: 1.5 packs/day for 29.0 years (43.5 ttl pk-yrs)    Types: Cigarettes    Start date: 12/30/1965    Quit date: 12/30/1994  Years since quitting: 29.1   Smokeless tobacco: Never   Tobacco comments:    smoking cessation materials not required  Vaping Use   Vaping status: Never Used  Substance Use Topics   Alcohol use: No    Alcohol/week: 0.0 standard drinks of alcohol   Drug use: No     Medication list has been reviewed and updated.  No outpatient medications have been marked as taking for the 02/13/24 encounter (Orders Only) with [name], MD.       02/12/2024   10:56 AM 12/08/2023   10:22 AM 12/08/2023   10:13 AM 09/10/2023   10:32 AM  GAD 7 : Generalized Anxiety Score  Nervous, Anxious, on Edge 0 0 0 0  Control/stop worrying 0 0 0 0  Worry too much - different  things 0 0 0 0  Trouble relaxing 0 0 0 0  Restless 0 0 0 0  Easily annoyed or irritable 0 0 0 0  Afraid - awful might happen 0 0 0 0  Total GAD 7 Score 0 0 0 0  Anxiety Difficulty Not difficult at all Not difficult at all Not difficult at all Not difficult at all       02/12/2024   10:56 AM 12/08/2023   10:22 AM 12/08/2023   10:13 AM  Depression screen PHQ 2/9  Decreased Interest 0 0 0  Down, Depressed, Hopeless 0 0 0  PHQ - 2 Score 0 0 0  Altered sleeping  0 0  Tired, decreased energy  0 0  Change in appetite  0 0  Feeling bad or failure about yourself   0 0  Trouble concentrating  0 0  Moving slowly or fidgety/restless  0 0  Suicidal thoughts  0 0  PHQ-9 Score  0 0  Difficult doing work/chores  Not difficult at all Not difficult at all    BP Readings from Last 3 Encounters:  02/12/24 (!) 100/58  12/08/23 132/70  09/10/23 128/74    Physical Exam  Wt Readings from Last 3 Encounters:  02/12/24 192 lb (87.1 kg)  12/08/23 188 lb (85.3 kg)  09/10/23 189 lb (85.7 kg)    There were no vitals taken for this visit.  Assessment and Plan:  Problem List Items Addressed This Visit   None   No follow-ups on file.    [name], MD [facility] [place]

## 2024-02-13 NOTE — Progress Notes (Signed)
Spoke with patient and he said he would like to add medication .

## 2024-02-20 ENCOUNTER — Other Ambulatory Visit: Payer: Self-pay | Admitting: Internal Medicine

## 2024-02-20 ENCOUNTER — Telehealth: Payer: Self-pay

## 2024-02-20 DIAGNOSIS — E118 Type 2 diabetes mellitus with unspecified complications: Secondary | ICD-10-CM

## 2024-02-20 MED ORDER — TRESIBA FLEXTOUCH 100 UNIT/ML ~~LOC~~ SOPN: 70.0000 [IU] | PEN_INJECTOR | Freq: Every day | SUBCUTANEOUS | 3 refills | Status: DC

## 2024-02-20 NOTE — Telephone Encounter (Signed)
 Please review.  KP  Copied from CRM [id number]. Topic: Clinical - Medication Question >> Feb 19, 2024  3:37 PM Gary Gutierrez wrote: Reason for CRM: pt states Dr Asencion Partridge advised him to take 2 shots of insulin, am and pm.  Pt states he prefers on shot a day : he started at 50 units.  That wasn't doing good. Then he went to 60 units.still blood sugars were running high. So he tried 70 units. (Pt takes in the afternoon)   Pt states his blood sugar has been lowest 101 and highest 120.  He states he feels great.  This is first time in 7 yrs his blood sugars have been constantly just over 100.  Pt's prescription for insulin degludec (TRESIBA FLEXTOUCH) 100 UNIT/ML FlexTouch Pen Is going to run out early. Pt needs a new script for this medication. Pt just seen by Dr Asencion Partridge on 02/12/2024.  Pt declined to make appt b/c of this.

## 2024-02-25 ENCOUNTER — Other Ambulatory Visit: Payer: Self-pay | Admitting: Internal Medicine

## 2024-02-25 DIAGNOSIS — M109 Gout, unspecified: Secondary | ICD-10-CM

## 2024-02-25 DIAGNOSIS — K219 Gastro-esophageal reflux disease without esophagitis: Secondary | ICD-10-CM

## 2024-02-26 NOTE — Telephone Encounter (Signed)
 Requested medication (s) are due for refill today:   Yes for all 3  Requested medication (s) are on the active medication list:   Yes for all 3  Future visit scheduled:   Yes 6/16   LOV 02/12/2024 had physical   Last ordered: Accu-chek strips 02/13/2023 #100, 12 refills-needs new rx;   Allopurinol 11/25/2023 #90, 0 refills - uric acid level due per protocol;  Protonix 09/10/2023 #180, 0 refills      Requested Prescriptions  Pending Prescriptions Disp Refills   ACCU-CHEK AVIVA PLUS test strip [Pharmacy Med Name: ACCU-CHEK AVIVA PLUS TEST STRIP 50S] 100 strip 12    Sig: TEST BLOOD SUGAR DAILY AS DIRECTED UP TO FOUR TIMES DAILY     Endocrinology: Diabetes - Testing Supplies Passed - 02/26/2024 11:33 AM      Passed - Valid encounter within last 12 months    Recent Outpatient Visits           2 months ago Atypical pneumonia   [place] [facility] at [facility], Nyoka Cowden, MD   5 months ago Type II diabetes mellitus with complication [facility])   [place] [facility] at [facility], Nyoka Cowden, MD   10 months ago Type II diabetes mellitus with complication [facility])   [place] [facility] at [facility], Nyoka Cowden, MD   1 year ago Essential hypertension   [place] [facility] at [facility], Nyoka Cowden, MD   1 year ago Community acquired pneumonia of right lower lobe of lung    Primary Care & Sports Medicine at [facility], Nyoka Cowden, MD       Future Appointments             In 3 months [name] Nyoka Cowden, MD [facility] Primary Care & Sports Medicine at [facility], [facility]             allopurinol (ZYLOPRIM) 300 MG tablet [Pharmacy Med Name: ALLOPURINOL 300MG  TABLETS] 90 tablet 0    Sig: TAKE 1 TABLET(300 MG) BY MOUTH DAILY     Endocrinology:  Gout Agents - allopurinol Failed - 02/26/2024 11:33 AM      Failed - Uric Acid in normal  range and within 360 days    Uric Acid  Date Value Ref Range Status  09/28/2020 3.5 (L) 3.8 - 8.4 mg/dL Final    Comment:               Therapeutic target for gout patients: <6.0         Failed - Cr in normal range and within 360 days    Creatinine  Date Value Ref Range Status  10/11/2014 1.19 0.60 - 1.30 mg/dL Final   Creatinine, Ser  Date Value Ref Range Status  02/12/2024 1.62 (H) 0.76 - 1.27 mg/dL Final         Failed - CBC within normal limits and completed in the last 12 months    WBC  Date Value Ref Range Status  02/12/2024 11.5 (H) 3.4 - 10.8 x10E3/uL Final  11/20/2019 8.5 4.0 - 10.5 K/uL Final   RBC  Date Value Ref Range Status  02/12/2024 4.50 4.14 - 5.80 x10E6/uL Final  11/20/2019 4.75 4.22 - 5.81 MIL/uL Final   Hemoglobin  Date Value Ref Range Status  02/12/2024 14.2 13.0 - 17.7 g/dL Final   Hematocrit  Date Value Ref Range Status  02/12/2024 43.4 37.5 - 51.0 % Final   MCHC  Date Value Ref Range Status  02/12/2024 32.7 31.5 - 35.7 g/dL Final  11/91/4782 95.6 30.0 - 36.0 g/dL Final   [facility]  Date Value Ref Range Status  02/12/2024 31.6 26.6 - 33.0 pg Final  11/20/2019 30.7 26.0 - 34.0 pg Final   MCV  Date Value Ref Range Status  02/12/2024 96 79 - 97 fL Final  10/10/2014 96 80 - 100 fL Final   No results found for: "PLTCOUNTKUC", "LABPLAT", "POCPLA" RDW  Date Value Ref Range Status  02/12/2024 13.4 11.6 - 15.4 % Final  10/10/2014 14.2 11.5 - 14.5 % Final         Passed - Valid encounter within last 12 months    Recent Outpatient Visits           2 months ago Atypical pneumonia   [place] [facility] at [facility], Nyoka Cowden, MD   5 months ago Type II diabetes mellitus with complication [facility])   [place] [facility] at [facility], Nyoka Cowden, MD   10 months ago Type II diabetes mellitus with complication [facility])   [place] [facility] Medicine at [facility], Nyoka Cowden, MD   1 year ago Essential hypertension   [place] [facility] at [facility], Nyoka Cowden, MD   1 year ago Community acquired pneumonia of right lower lobe of lung   [place] [facility] at [facility], Nyoka Cowden, MD       Future Appointments             In 3 months [name], Nyoka Cowden, MD [facility] Primary Care & Sports Medicine at [facility], [facility]             pantoprazole (PROTONIX) 40 MG tablet [Pharmacy Med Name: PANTOPRAZOLE 40MG  TABLETS] 180 tablet 1    Sig: TAKE 1 TABLET(40 MG) BY MOUTH TWICE DAILY     Gastroenterology: Proton Pump Inhibitors Passed - 02/26/2024 11:33 AM      Passed - Valid encounter within last 12 months    Recent Outpatient Visits           2 months ago Atypical pneumonia   [place] [facility] Medicine at [facility], Nyoka Cowden, MD   5 months ago Type II diabetes mellitus with complication [facility])   [place] [facility] at [facility], Nyoka Cowden, MD   10 months ago Type II diabetes mellitus with complication [facility])   [place] [facility] Medicine at [facility], Nyoka Cowden, MD   1 year ago Essential hypertension   [place] [facility] Sports Medicine at [facility], Nyoka Cowden, MD   1 year ago Community acquired pneumonia of right lower lobe of lung   [facility] Primary Care & Sports Medicine at [facility], Nyoka Cowden, MD       Future Appointments             In 3 months [name], Nyoka Cowden, MD [facility] Primary Care & Sports Medicine at [facility], [facility]

## 2024-03-08 ENCOUNTER — Other Ambulatory Visit: Payer: Self-pay | Admitting: Internal Medicine

## 2024-03-08 DIAGNOSIS — E1142 Type 2 diabetes mellitus with diabetic polyneuropathy: Secondary | ICD-10-CM

## 2024-03-09 NOTE — Telephone Encounter (Signed)
 Requested medications are due for refill today.  yes  Requested medications are on the active medications list.  yes  Last refill. 02/12/2024 #60 0 rf  Future visit scheduled.   yes  Notes to clinic.  Refill not delegated.    Requested Prescriptions  Pending Prescriptions Disp Refills   pregabalin (LYRICA) 50 MG capsule [Pharmacy Med Name: PREGABALIN 50MG  CAPSULES] 60 capsule     Sig: TAKE 1 CAPSULE(50 MG) BY MOUTH TWICE DAILY     Not Delegated - Neurology:  Anticonvulsants - Controlled - pregabalin Failed - 03/09/2024 12:58 PM      Failed - This refill cannot be delegated      Failed - Cr in normal range and within 360 days    Creatinine  Date Value Ref Range Status  10/11/2014 1.19 0.60 - 1.30 mg/dL Final   Creatinine, Ser  Date Value Ref Range Status  02/12/2024 1.62 (H) 0.76 - 1.27 mg/dL Final         Passed - Completed PHQ-2 or PHQ-9 in the last 360 days      Passed - Valid encounter within last 12 months    Recent Outpatient Visits           3 months ago Atypical pneumonia   [place] [facility] at [facility], Nyoka Cowden, MD   6 months ago Type II diabetes mellitus with complication [facility])   [place] [facility] Medicine at [facility], Nyoka Cowden, MD   10 months ago Type II diabetes mellitus with complication [facility])   [place] [facility] Medicine at [facility], Nyoka Cowden, MD   1 year ago Essential hypertension   [place] [facility] Medicine at [facility], Nyoka Cowden, MD   1 year ago Community acquired pneumonia of right lower lobe of lung   [facility] Primary Care & Sports Medicine at [facility], Nyoka Cowden, MD       Future Appointments             In 3 months [name], Nyoka Cowden, MD [facility] Primary Care & Sports Medicine at [facility], [facility]

## 2024-03-09 NOTE — Telephone Encounter (Signed)
 Med refill

## 2024-03-10 NOTE — Telephone Encounter (Signed)
Patient has been notified.  He verbalized understanding.

## 2024-03-10 NOTE — Telephone Encounter (Signed)
 Attempted to contact patient. No answer. No VM left.

## 2024-04-17 ENCOUNTER — Other Ambulatory Visit: Payer: Self-pay | Admitting: Internal Medicine

## 2024-04-17 DIAGNOSIS — M109 Gout, unspecified: Secondary | ICD-10-CM

## 2024-04-17 DIAGNOSIS — E118 Type 2 diabetes mellitus with unspecified complications: Secondary | ICD-10-CM

## 2024-04-19 ENCOUNTER — Other Ambulatory Visit: Payer: Self-pay

## 2024-04-19 DIAGNOSIS — E118 Type 2 diabetes mellitus with unspecified complications: Secondary | ICD-10-CM

## 2024-04-19 DIAGNOSIS — M109 Gout, unspecified: Secondary | ICD-10-CM

## 2024-04-19 MED ORDER — ALLOPURINOL 300 MG PO TABS: 300.0000 mg | ORAL_TABLET | Freq: Every day | ORAL | 1 refills | Status: DC

## 2024-04-19 MED ORDER — LINAGLIPTIN 5 MG PO TABS: 5.0000 mg | ORAL_TABLET | Freq: Every day | ORAL | 1 refills | Status: DC

## 2024-04-19 NOTE — Telephone Encounter (Signed)
 Requested Prescriptions  Refused Prescriptions Disp Refills   TRADJENTA  5 MG TABS tablet [Pharmacy Med Name: TRADJENTA  5MG  TABLETS] 90 tablet 1    Sig: TAKE 1 TABLET(5 MG) BY MOUTH DAILY     Endocrinology:  Diabetes - DPP-4 Inhibitors - linagliptin  Failed - 04/19/2024 10:19 AM      Failed - HBA1C is between 0 and 7.9 and within 180 days    Hemoglobin A1C  Date Value Ref Range Status  10/11/2014 6.9 (H) 4.2 - 6.3 % Final    Comment:    The American Diabetes Association recommends that a primary goal of therapy should be <7% and that physicians should reevaluate the treatment regimen in patients with HbA1c values consistently >8%.    Hgb A1c MFr Bld  Date Value Ref Range Status  02/12/2024 8.6 (H) 4.8 - 5.6 % Final    Comment:             Prediabetes: 5.7 - 6.4          Diabetes: >6.4          Glycemic control for adults with diabetes: <7.0          Passed - Valid encounter within last 6 months    Recent Outpatient Visits           2 months ago Annual physical exam   [place] [facility] at [facility], Chales [place], MD       Future Appointments             In 1 month Gala Jubilee, [place]ales [place], MD [facility] Primary Care & Sports Medicine at [facility], [facility]             allopurinol  (ZYLOPRIM ) 300 MG tablet [Pharmacy Med Name: ALLOPURINOL  300MG  TABLETS] 90 tablet 0    Sig: TAKE 1 TABLET(300 MG) BY MOUTH DAILY     Endocrinology:  Gout Agents - allopurinol  Failed - 04/19/2024 10:19 AM      Failed - Uric Acid in normal range and within 360 days    Uric Acid  Date Value Ref Range Status  09/28/2020 3.5 (L) 3.8 - 8.4 mg/dL Final    Comment:               Therapeutic target for gout patients: <6.0         Failed - Cr in normal range and within 360 days    Creatinine  Date Value Ref Range Status  10/11/2014 1.19 0.60 - 1.30 mg/dL Final   Creatinine, Ser  Date Value Ref Range Status  02/12/2024 1.62 (H) 0.76 - 1.27 mg/dL Final          Passed - Valid encounter within last 12 months    Recent Outpatient Visits           2 months ago Annual physical exam   [place] [facility] at [facility], Chales [place], MD       Future Appointments             In 1 month Sheron Dixons, MD [facility] Primary Care & Sports Medicine at [facility], [facility]            Passed - CBC within normal limits and completed in the last 12 months    WBC  Date Value Ref Range Status  02/12/2024 11.5 (H) 3.4 - 10.8 x10E3/uL Final  11/20/2019 8.5 4.0 - 10.5 K/uL Final   RBC  Date Value Ref Range Status  02/12/2024 4.50 4.14 - 5.80 x10E6/uL Final  11/20/2019 4.75 4.22 - 5.81 MIL/uL Final   Hemoglobin  Date Value Ref Range Status  02/12/2024 14.2 13.0 - 17.7 g/dL Final   Hematocrit  Date Value Ref Range Status  02/12/2024 43.4 37.5 - 51.0 % Final   MCHC  Date Value Ref Range Status  02/12/2024 32.7 31.5 - 35.7 g/dL Final  16/09/9603 54.0 30.0 - 36.0 g/dL Final   [facility]  Date Value Ref Range Status  02/12/2024 31.6 26.6 - 33.0 pg Final  11/20/2019 30.7 26.0 - 34.0 pg Final   MCV  Date Value Ref Range Status  02/12/2024 96 79 - 97 fL Final  10/10/2014 96 80 - 100 fL Final   No results found for: "PLTCOUNTKUC", "LABPLAT", "POCPLA" RDW  Date Value Ref Range Status  02/12/2024 13.4 11.6 - 15.4 % Final  10/10/2014 14.2 11.5 - 14.5 % Final

## 2024-04-19 NOTE — Telephone Encounter (Signed)
Medication refill via fax

## 2024-04-22 ENCOUNTER — Other Ambulatory Visit: Payer: Self-pay | Admitting: Internal Medicine

## 2024-04-22 DIAGNOSIS — E118 Type 2 diabetes mellitus with unspecified complications: Secondary | ICD-10-CM

## 2024-04-22 DIAGNOSIS — I25119 Atherosclerotic heart disease of native coronary artery with unspecified angina pectoris: Secondary | ICD-10-CM

## 2024-04-23 NOTE — Telephone Encounter (Signed)
 Requested Prescriptions  Pending Prescriptions Disp Refills   clopidogrel  (PLAVIX ) 75 MG tablet [Pharmacy Med Name: CLOPIDOGREL  75MG  TABLETS] 90 tablet 0    Sig: TAKE 1 TABLET(75 MG) BY MOUTH DAILY     Hematology: Antiplatelets - clopidogrel  Failed - 04/23/2024  1:02 PM      Failed - Cr in normal range and within 360 days    Creatinine  Date Value Ref Range Status  10/11/2014 1.19 0.60 - 1.30 mg/dL Final   Creatinine, Ser  Date Value Ref Range Status  02/12/2024 1.62 (H) 0.76 - 1.27 mg/dL Final         Passed - HCT in normal range and within 180 days    Hematocrit  Date Value Ref Range Status  02/12/2024 43.4 37.5 - 51.0 % Final         Passed - HGB in normal range and within 180 days    Hemoglobin  Date Value Ref Range Status  02/12/2024 14.2 13.0 - 17.7 g/dL Final         Passed - PLT in normal range and within 180 days    Platelets  Date Value Ref Range Status  02/12/2024 203 150 - 450 x10E3/uL Final         Passed - Valid encounter within last 6 months    Recent Outpatient Visits           2 months ago Annual physical exam   [place] [facility] at [facility], Chales [place], MD       Future Appointments             In 1 month Gala Jubilee, [place], MD [facility] Primary Care & Sports Medicine at [facility], [facility]             empagliflozin  (JARDIANCE ) 25 MG TABS tablet [Pharmacy Med Name: JARDIANCE  25MG  TABLETS] 90 tablet 0    Sig: TAKE 1 TABLET(25 MG) BY MOUTH DAILY     Endocrinology:  Diabetes - SGLT2 Inhibitors Failed - 04/23/2024  1:02 PM      Failed - Cr in normal range and within 360 days    Creatinine  Date Value Ref Range Status  10/11/2014 1.19 0.60 - 1.30 mg/dL Final   Creatinine, Ser  Date Value Ref Range Status  02/12/2024 1.62 (H) 0.76 - 1.27 mg/dL Final         Failed - HBA1C is between 0 and 7.9 and within 180 days    Hemoglobin A1C  Date Value Ref Range Status  10/11/2014 6.9 (H) 4.2 - 6.3 % Final     Comment:    The American Diabetes Association recommends that a primary goal of therapy should be <7% and that physicians should reevaluate the treatment regimen in patients with HbA1c values consistently >8%.    Hgb A1c MFr Bld  Date Value Ref Range Status  02/12/2024 8.6 (H) 4.8 - 5.6 % Final    Comment:             Prediabetes: 5.7 - 6.4          Diabetes: >6.4          Glycemic control for adults with diabetes: <7.0          Failed - eGFR in normal range and within 360 days    EGFR (African American)  Date Value Ref Range Status  10/11/2014 >60 >57mL/min Final   GFR calc Af Zara Heymann  Date Value Ref Range Status  02/14/2021 54 (L) >59 mL/min/1.73 Final    Comment:    **In accordance with recommendations from the NKF-ASN Task force,**   [facility] is in the process of updating its eGFR calculation to the   2021 CKD-EPI creatinine equation that estimates kidney function   without a race variable.    EGFR (Non-African Amer.)  Date Value Ref Range Status  10/11/2014 >60 >43mL/min Final    Comment:    eGFR values <50mL/min/1.73 m2 may be an indication of chronic kidney disease (CKD). Calculated eGFR, using the MRDR Study equation, is useful in  patients with stable renal function. The eGFR calculation will not be reliable in acutely ill patients when serum creatinine is changing rapidly. It is not useful in patients on dialysis. The eGFR calculation may not be applicable to patients at the low and high extremes of body sizes, pregnant women, and vegetarians.    GFR calc non Af Amer  Date Value Ref Range Status  02/14/2021 47 (L) >59 mL/min/1.73 Final   eGFR  Date Value Ref Range Status  02/12/2024 45 (L) >59 mL/min/1.73 Final         Passed - Valid encounter within last 6 months    Recent Outpatient Visits           2 months ago Annual physical exam   [place] [facility] at [facility], Chales [place], MD       Future  Appointments             In 1 month Gala Jubilee Chales [place], MD [facility] Primary Care & Sports Medicine at [facility], [facility]             LEVEMIR  FLEXPEN 100 UNIT/ML FlexPen [Pharmacy Med Name: LEVEMIR  FLEXPEN INJECTION 3ML] 15 mL 5    Sig: ADMINISTER 50 UNITS UNDER THE SKIN DAILY     Endocrinology:  Diabetes - Insulins Failed - 04/23/2024  1:02 PM      Failed - HBA1C is between 0 and 7.9 and within 180 days    Hemoglobin A1C  Date Value Ref Range Status  10/11/2014 6.9 (H) 4.2 - 6.3 % Final    Comment:    The American Diabetes Association recommends that a primary goal of therapy should be <7% and that physicians should reevaluate the treatment regimen in patients with HbA1c values consistently >8%.    Hgb A1c MFr Bld  Date Value Ref Range Status  02/12/2024 8.6 (H) 4.8 - 5.6 % Final    Comment:             Prediabetes: 5.7 - 6.4          Diabetes: >6.4          Glycemic control for adults with diabetes: <7.0          Passed - Valid encounter within last 6 months    Recent Outpatient Visits           2 months ago Annual physical exam   [facility] at [facility], Chales [place], MD       Future Appointments             In 1 month Gala Jubilee, [place], MD [facility] Primary Care & Sports Medicine at [facility], [facility]

## 2024-05-03 ENCOUNTER — Other Ambulatory Visit: Payer: Self-pay | Admitting: Internal Medicine

## 2024-05-03 DIAGNOSIS — I25119 Atherosclerotic heart disease of native coronary artery with unspecified angina pectoris: Secondary | ICD-10-CM

## 2024-05-05 NOTE — Telephone Encounter (Signed)
 Too soon for refill.  Requested Prescriptions  Pending Prescriptions Disp Refills   clopidogrel  (PLAVIX ) 75 MG tablet [Pharmacy Med Name: CLOPIDOGREL  75MG  TABLETS] 90 tablet 0    Sig: TAKE 1 TABLET(75 MG) BY MOUTH DAILY     Hematology: Antiplatelets - clopidogrel  Failed - 05/05/2024 12:35 PM      Failed - Cr in normal range and within 360 days    Creatinine  Date Value Ref Range Status  10/11/2014 1.19 0.60 - 1.30 mg/dL Final   Creatinine, Ser  Date Value Ref Range Status  02/12/2024 1.62 (H) 0.76 - 1.27 mg/dL Final         Passed - HCT in normal range and within 180 days    Hematocrit  Date Value Ref Range Status  02/12/2024 43.4 37.5 - 51.0 % Final         Passed - HGB in normal range and within 180 days    Hemoglobin  Date Value Ref Range Status  02/12/2024 14.2 13.0 - 17.7 g/dL Final         Passed - PLT in normal range and within 180 days    Platelets  Date Value Ref Range Status  02/12/2024 203 150 - 450 x10E3/uL Final         Passed - Valid encounter within last 6 months    Recent Outpatient Visits           2 months ago Annual physical exam   [facility] at [facility], Chales [place], MD       Future Appointments             In 1 month Gala Jubilee Chales [place], MD [facility] Primary Care & Sports Medicine at [facility], [facility]

## 2024-05-17 LAB — HM DIABETES EYE EXAM

## 2024-05-22 ENCOUNTER — Other Ambulatory Visit: Payer: Self-pay | Admitting: Internal Medicine

## 2024-05-22 DIAGNOSIS — E1169 Type 2 diabetes mellitus with other specified complication: Secondary | ICD-10-CM

## 2024-05-25 ENCOUNTER — Other Ambulatory Visit: Payer: Self-pay | Admitting: Internal Medicine

## 2024-05-25 ENCOUNTER — Telehealth: Payer: Self-pay

## 2024-05-25 DIAGNOSIS — I10 Essential (primary) hypertension: Secondary | ICD-10-CM

## 2024-05-25 NOTE — Telephone Encounter (Signed)
 Copied from CRM [id number]. Topic: Clinical - Medication Prior Auth >> May 25, 2024 11:16 AM DeAngela L wrote: Reason for CRM: [facility] calling to see if the office received Prior Auth fax sent on 5/24 for Evolocumab  (REPATHA  SURECLICK) 140 MG/ML SOAJ [facility] will fax another copy today just in case   [id number] [place] - [address] AT [facility] OF [address] [place] [address] Phone: [phone] Fax: [phone]

## 2024-05-26 NOTE — Telephone Encounter (Signed)
 Too soon for refill for test strips.  Requested Prescriptions  Pending Prescriptions Disp Refills   atorvastatin  (LIPITOR ) 80 MG tablet [Pharmacy Med Name: ATORVASTATIN  80MG  TABLETS] 90 tablet 0    Sig: TAKE 1 TABLET(80 MG) BY MOUTH AT BEDTIME     Cardiovascular:  Antilipid - Statins Failed - 05/26/2024  8:08 AM      Failed - Lipid Panel in normal range within the last 12 months    Cholesterol, Total  Date Value Ref Range Status  02/12/2024 152 100 - 199 mg/dL Final   Cholesterol  Date Value Ref Range Status  10/11/2014 102 0 - 200 mg/dL Final   Ldl Cholesterol, Calc  Date Value Ref Range Status  10/11/2014 39 0 - 100 mg/dL Final   LDL Chol Calc (NIH)  Date Value Ref Range Status  02/12/2024 61 0 - 99 mg/dL Final   HDL Cholesterol  Date Value Ref Range Status  10/11/2014 24 (L) 40 - 60 mg/dL Final   HDL  Date Value Ref Range Status  02/12/2024 31 (L) >39 mg/dL Final   Triglycerides  Date Value Ref Range Status  02/12/2024 388 (H) 0 - 149 mg/dL Final  81/19/1478 295 0 - 200 mg/dL Final         Passed - Patient is not pregnant      Passed - Valid encounter within last 12 months    Recent Outpatient Visits           3 months ago Annual physical exam   [place] [facility] at [facility], Chales [place], MD       Future Appointments             In 2 weeks Sheron Dixons, MD [facility] Primary Care & Sports Medicine at [facility]            Refused Prescriptions Disp Refills   glucose blood (ACCU-CHEK AVIVA PLUS) test strip [Pharmacy Med Name: ACCU-CHEK AVIVA PLUS STRIPS 100S] 100 strip 12    Sig: USE TO TEST BLOOD SUGAR EVERY DAY UP TO FOUR TIMES DAILY AS DIRECTED     Endocrinology: Diabetes - Testing Supplies Passed - 05/26/2024  8:08 AM      Passed - Valid encounter within last 12 months    Recent Outpatient Visits           3 months ago Annual physical exam   [facility] Primary Care & Sports Medicine at  [facility], Chales [place], MD       Future Appointments             In 2 weeks Gala Jubilee, [place], MD [facility] Primary Care & Sports Medicine at [facility], [facility]

## 2024-05-27 ENCOUNTER — Telehealth: Payer: Self-pay | Admitting: Pharmacy Technician

## 2024-05-27 ENCOUNTER — Other Ambulatory Visit (HOSPITAL_COMMUNITY): Payer: Self-pay

## 2024-05-27 NOTE — Telephone Encounter (Signed)
 Pharmacy Patient Advocate Encounter   Received notification from Pt Calls Messages that prior authorization for Repatha  SureClick 140MG /ML auto-injectors  is required/requested.   Insurance verification completed.   The patient is insured through [facility] .   Per test claim: PA required; PA submitted to above mentioned insurance via CoverMyMeds Key/confirmation #/EOC [id number] Status is pending

## 2024-05-27 NOTE — Telephone Encounter (Signed)
 PA request has been Submitted. New Encounter has been or will be created for follow up. For additional info see Pharmacy Prior Auth telephone encounter from 05/27/24.

## 2024-05-27 NOTE — Telephone Encounter (Signed)
 Requested Prescriptions  Pending Prescriptions Disp Refills   losartan  (COZAAR ) 50 MG tablet [Pharmacy Med Name: LOSARTAN  50MG  TABLETS] 90 tablet 0    Sig: TAKE 1 TABLET(50 MG) BY MOUTH DAILY     Cardiovascular:  Angiotensin Receptor Blockers Failed - 05/27/2024  3:08 PM      Failed - Cr in normal range and within 180 days    Creatinine  Date Value Ref Range Status  10/11/2014 1.19 0.60 - 1.30 mg/dL Final   Creatinine, Ser  Date Value Ref Range Status  02/12/2024 1.62 (H) 0.76 - 1.27 mg/dL Final         Passed - K in normal range and within 180 days    Potassium  Date Value Ref Range Status  02/12/2024 4.5 3.5 - 5.2 mmol/L Final  10/11/2014 4.1 3.5 - 5.1 mmol/L Final         Passed - Patient is not pregnant      Passed - Last BP in normal range    BP Readings from Last 1 Encounters:  02/12/24 (!) 100/58         Passed - Valid encounter within last 6 months    Recent Outpatient Visits           3 months ago Annual physical exam   [facility] at [facility], Chales [place], MD       Future Appointments             In 2 weeks Gala Jubilee [place], MD [facility] Medicine at [facility], [facility]

## 2024-05-27 NOTE — Telephone Encounter (Signed)
 Thanks for the update

## 2024-05-27 NOTE — Telephone Encounter (Signed)
 Pharmacy Patient Advocate Encounter  Received notification from [employer] that Prior Authorization for Repatha  SureClick 140MG /ML auto-injectors has been APPROVED from 05/27/24 to 11/27/24. Ran test claim, Copay is $0.00. This test claim was processed through [facility]- copay amounts may vary at other pharmacies due to pharmacy/plan contracts, or as the patient moves through the different stages of their insurance plan.   PA #/Case ID/Reference #: [id number]

## 2024-06-02 ENCOUNTER — Other Ambulatory Visit: Payer: Self-pay | Admitting: Internal Medicine

## 2024-06-02 DIAGNOSIS — E1142 Type 2 diabetes mellitus with diabetic polyneuropathy: Secondary | ICD-10-CM

## 2024-06-02 MED ORDER — PREGABALIN 50 MG PO CAPS: 50.0000 mg | ORAL_CAPSULE | Freq: Two times a day (BID) | ORAL | 2 refills | Status: DC

## 2024-06-02 NOTE — Telephone Encounter (Signed)
 Please review.  KP

## 2024-06-02 NOTE — Telephone Encounter (Signed)
 Copied from CRM [id number]. Topic: Clinical - Medication Refill >> Jun 02, 2024 10:27 AM Tiffany S wrote: Medication: pregabalin  (LYRICA ) 50 MG capsule [[id number]]  Has the patient contacted their pharmacy? Yes (Agent: If no, request that the patient contact the pharmacy for the refill. If patient does not wish to contact the pharmacy document the reason why and proceed with request.) (Agent: If yes, when and what did the pharmacy advise?)  This is the patient's preferred pharmacy:  [id number] [place] - [address] AT [facility] [address] [place] [address] Phone: [phone] Fax: [phone]  Is this the correct pharmacy for this prescription? Yes If no, delete pharmacy and type the correct one.   Has the prescription been filled recently? Yes  Is the patient out of the medication? Yes  Has the patient been seen for an appointment in the last year OR does the patient have an upcoming appointment? Yes  Can we respond through MyChart? Yes  Agent: Please be advised that Rx refills may take up to 3 business days. We ask that you follow-up with your pharmacy.

## 2024-06-02 NOTE — Telephone Encounter (Unsigned)
 Copied from CRM [id number]. Topic: Clinical - Medication Refill >> Jun 02, 2024 10:27 AM Tiffany S wrote: Medication: pregabalin  (LYRICA ) 50 MG capsule [[id number]]  Has the patient contacted their pharmacy? Yes (Agent: If no, request that the patient contact the pharmacy for the refill. If patient does not wish to contact the pharmacy document the reason why and proceed with request.) (Agent: If yes, when and what did the pharmacy advise?)  This is the patient's preferred pharmacy:  [id number], [place] - [address] AT [facility] OF [address] [address] [place] [address] Phone: [phone] Fax: [phone]  Is this the correct pharmacy for this prescription? Yes If no, delete pharmacy and type the correct one.   Has the prescription been filled recently? Yes  Is the patient out of the medication? Yes  Has the patient been seen for an appointment in the last year OR does the patient have an upcoming appointment? Yes  Can we respond through MyChart? Yes  Agent: Please be advised that Rx refills may take up to 3 business days. We ask that you follow-up with your pharmacy.

## 2024-06-14 ENCOUNTER — Ambulatory Visit: Payer: 59 | Admitting: Internal Medicine

## 2024-06-15 ENCOUNTER — Ambulatory Visit (INDEPENDENT_AMBULATORY_CARE_PROVIDER_SITE_OTHER): Admitting: Internal Medicine

## 2024-06-15 ENCOUNTER — Encounter: Payer: Self-pay | Admitting: Internal Medicine

## 2024-06-15 VITALS — BP 98/62 | HR 91 | Ht 69.0 in | Wt 192.0 lb

## 2024-06-15 DIAGNOSIS — L03114 Cellulitis of left upper limb: Secondary | ICD-10-CM

## 2024-06-15 DIAGNOSIS — I10 Essential (primary) hypertension: Secondary | ICD-10-CM

## 2024-06-15 DIAGNOSIS — I25119 Atherosclerotic heart disease of native coronary artery with unspecified angina pectoris: Secondary | ICD-10-CM | POA: Diagnosis not present

## 2024-06-15 DIAGNOSIS — E118 Type 2 diabetes mellitus with unspecified complications: Secondary | ICD-10-CM | POA: Diagnosis not present

## 2024-06-15 DIAGNOSIS — Z794 Long term (current) use of insulin: Secondary | ICD-10-CM

## 2024-06-15 MED ORDER — DOXYCYCLINE HYCLATE 100 MG PO TABS: 100.0000 mg | ORAL_TABLET | Freq: Two times a day (BID) | ORAL | 0 refills | Status: AC

## 2024-06-15 MED ORDER — REPATHA SURECLICK 140 MG/ML ~~LOC~~ SOAJ: 140.0000 mg | SUBCUTANEOUS | 3 refills | Status: DC

## 2024-06-15 MED ORDER — EMPAGLIFLOZIN 25 MG PO TABS: 25.0000 mg | ORAL_TABLET | Freq: Every day | ORAL | 1 refills | Status: DC

## 2024-06-15 MED ORDER — ISOSORBIDE MONONITRATE ER 30 MG PO TB24: 30.0000 mg | ORAL_TABLET | Freq: Every day | ORAL | 1 refills | Status: DC

## 2024-06-15 MED ORDER — GLIPIZIDE ER 10 MG PO TB24: 10.0000 mg | ORAL_TABLET | Freq: Every day | ORAL | 1 refills | Status: DC

## 2024-06-15 NOTE — Assessment & Plan Note (Signed)
 Blood pressure is well controlled.  Current medications are ISDN, Coreg  and losartan . Will continue same regimen along with efforts to limit dietary sodium.

## 2024-06-15 NOTE — Progress Notes (Signed)
 Date:  06/15/2024   Name:  Gary Gutierrez   DOB:  24-May-1951   MRN:  [id number]   Chief Complaint: Diabetes and Hypertension (Pt said he stopped taking Isosorbide  60 mg due to side effects. Pt said it caused extreme dizziness. )  Diabetes He presents for his follow-up diabetic visit. He has type 2 diabetes mellitus. Pertinent negatives for hypoglycemia include no dizziness, headaches or nervousness/anxiousness. Pertinent negatives for diabetes include no chest pain, no fatigue and no weakness. Current diabetic treatments: Jardiance , glipizide , tradjenta  and Tresiba . He is compliant with treatment most of the time. There is no change in his home blood glucose trend.  Hyperlipidemia This is a chronic problem. The problem is uncontrolled. Recent lipid tests were reviewed and are high. Pertinent negatives include no chest pain, myalgias or shortness of breath. Current antihyperlipidemic treatment includes statins (repatha  added in February but only filled it twice).  CAD - he started Repatha  but could not get a refill despite refills available.  He denies chest pain - had been on ISDN 30 mg then increased to 60 mg.  Since then he has had lightheadedness.  He reduced himself to 30 mg and feels better.  Review of Systems  Constitutional:  Negative for fatigue and unexpected weight change.  HENT:  Negative for nosebleeds.   Eyes:  Negative for visual disturbance.  Respiratory:  Negative for cough, chest tightness, shortness of breath and wheezing.   Cardiovascular:  Negative for chest pain, palpitations and leg swelling.  Gastrointestinal:  Negative for abdominal pain, constipation and diarrhea.  Genitourinary:  Negative for dysuria and urgency.  Musculoskeletal:  Negative for arthralgias and myalgias.  Neurological:  Negative for dizziness, weakness, light-headedness and headaches.  Psychiatric/Behavioral:  Positive for sleep disturbance. Negative for dysphoric mood. The patient is not  nervous/anxious.      Lab Results  Component Value Date   NA 137 02/12/2024   K 4.5 02/12/2024   CO2 26 02/12/2024   GLUCOSE 212 (H) 02/12/2024   BUN 42 (H) 02/12/2024   CREATININE 1.62 (H) 02/12/2024   CALCIUM  9.3 02/12/2024   EGFR 45 (L) 02/12/2024   GFRNONAA 47 (L) 02/14/2021   Lab Results  Component Value Date   CHOL 152 02/12/2024   HDL 31 (L) 02/12/2024   LDLCALC 61 02/12/2024   TRIG 388 (H) 02/12/2024   CHOLHDL 4.9 02/12/2024   Lab Results  Component Value Date   TSH 4.810 (H) 02/12/2024   Lab Results  Component Value Date   HGBA1C 8.6 (H) 02/12/2024   Lab Results  Component Value Date   WBC 11.5 (H) 02/12/2024   HGB 14.2 02/12/2024   HCT 43.4 02/12/2024   MCV 96 02/12/2024   PLT 203 02/12/2024   Lab Results  Component Value Date   ALT 20 02/12/2024   AST 16 02/12/2024   ALKPHOS 165 (H) 02/12/2024   BILITOT 0.8 02/12/2024   No results found for: Lucetta Russel, VD25OH   Patient Active Problem List   Diagnosis Date Noted   Background diabetic retinopathy associated with type 2 diabetes mellitus (HCC) 05/07/2022   CKD stage 3 secondary to diabetes (HCC) 07/06/2020   Thoracic aortic atherosclerosis (HCC) 07/05/2020   Type II diabetes mellitus with complication (HCC) 10/27/2018   CHF (congestive heart failure) (HCC) 10/29/2017   Polyneuropathy due to type 2 diabetes mellitus (HCC) 07/01/2017   PAD (peripheral artery disease) (HCC) 06/30/2017   Tobacco use disorder, mild, in sustained remission, abuse 04/10/2017   Hx  of colonic polyps 04/10/2016   Hearing loss of both ears 01/03/2016   Chronic GERD 06/09/2015   Hyperlipidemia associated with type 2 diabetes mellitus (HCC) 06/09/2015   Essential hypertension 06/09/2015   Gouty arthritis of both feet 06/09/2015   Multilevel degenerative disc disease 06/09/2015   Coronary artery disease involving native coronary artery of native heart with angina pectoris (HCC) 05/09/2014    Allergies   Allergen Reactions   Entresto  [Sacubitril -Valsartan ] Other (See Comments)    Low blood pressure   Penicillins Other (See Comments)    Other reaction(s): Unknown, pt does not know reactions    Past Surgical History:  Procedure Laterality Date   APPENDECTOMY     colonscopy  2010   benign polyps   CORONARY ARTERY BYPASS GRAFT  1995   CORONARY STENT INTERVENTION N/A 02/03/2017   Procedure: Coronary Stent Intervention;  Surgeon: Antonette Batters, MD;  Location: [facility] INVASIVE CV LAB;  Service: Cardiovascular;  Laterality: N/A;   ESOPHAGOGASTRODUODENOSCOPY  2010   normal   LEFT HEART CATH AND CORONARY ANGIOGRAPHY N/A 02/03/2017   Procedure: Left Heart Cath and Coronary Angiography;  Surgeon: Antonette Batters, MD;  Location: [facility] INVASIVE CV LAB;  Service: Cardiovascular;  Laterality: N/A;   PERCUTANEOUS CORONARY STENT INTERVENTION (PCI-S)  2010, 2015   4 stents total    Social History   Tobacco Use   Smoking status: Former    Current packs/day: 0.00    Average packs/day: 1.5 packs/day for 29.0 years (43.5 ttl pk-yrs)    Types: Cigarettes    Start date: 12/30/1965    Quit date: 12/30/1994    Years since quitting: 29.4   Smokeless tobacco: Never   Tobacco comments:    smoking cessation materials not required  Vaping Use   Vaping status: Never Used  Substance Use Topics   Alcohol use: No    Alcohol/week: 0.0 standard drinks of alcohol   Drug use: No     Medication list has been reviewed and updated.  Current Meds  Medication Sig   allopurinol  (ZYLOPRIM ) 300 MG tablet Take 1 tablet (300 mg total) by mouth daily.   Ascorbic Acid  (VITAMIN C ) 1000 MG tablet Take 1,000 mg by mouth daily.   atorvastatin  (LIPITOR ) 80 MG tablet TAKE 1 TABLET(80 MG) BY MOUTH AT BEDTIME   budesonide -formoterol  (SYMBICORT ) 160-4.5 MCG/ACT inhaler Inhale 1 puff into the lungs 2 (two) times daily. (Patient taking differently: Inhale 1 puff into the lungs as needed.)   carvedilol  (COREG ) 6.25 MG tablet Take  6.25 mg by mouth 2 (two) times daily with a meal.   Cholecalciferol (VITAMIN D3 PO) Take by mouth.   clopidogrel  (PLAVIX ) 75 MG tablet TAKE 1 TABLET(75 MG) BY MOUTH DAILY   Continuous Glucose Sensor (DEXCOM G7 SENSOR) MISC Place one sensor every 10 days   doxycycline  (VIBRA -TABS) 100 MG tablet Take 1 tablet (100 mg total) by mouth 2 (two) times daily for 10 days.   glucose blood (ACCU-CHEK AVIVA PLUS) test strip TEST BLOOD SUGAR DAILY AS DIRECTED UP TO FOUR TIMES DAILY   insulin  degludec (TRESIBA  FLEXTOUCH) 100 UNIT/ML FlexTouch Pen Inject 70 Units into the skin daily.   Insulin  Pen Needle (PEN NEEDLES 31GX5/16) 31G X 8 MM MISC 1 each by Does not apply route daily.   linagliptin  (TRADJENTA ) 5 MG TABS tablet Take 1 tablet (5 mg total) by mouth daily.   losartan  (COZAAR ) 50 MG tablet TAKE 1 TABLET(50 MG) BY MOUTH DAILY   nitroGLYCERIN  (NITROSTAT ) 0.4 MG SL tablet  Place 1 tablet (0.4 mg total) under the tongue every 5 (five) minutes as needed for chest pain.   pantoprazole  (PROTONIX ) 40 MG tablet TAKE 1 TABLET(40 MG) BY MOUTH TWICE DAILY   potassium chloride  SA (K-DUR,KLOR-CON ) 20 MEQ tablet TAKE 2 TABLETS(40 MEQ) BY MOUTH DAILY (Patient taking differently: Take 40 mEq by mouth daily.)   pregabalin  (LYRICA ) 50 MG capsule Take 1 capsule (50 mg total) by mouth 2 (two) times daily.   torsemide  (DEMADEX ) 100 MG tablet Take by mouth 2 (two) times daily.    zinc  gluconate 50 MG tablet Take 50 mg by mouth daily.   [DISCONTINUED] empagliflozin  (JARDIANCE ) 25 MG TABS tablet TAKE 1 TABLET(25 MG) BY MOUTH DAILY   [DISCONTINUED] Evolocumab  (REPATHA  SURECLICK) 140 MG/ML SOAJ Inject 140 mg into the skin every 14 (fourteen) days.   [DISCONTINUED] glipiZIDE  (GLUCOTROL  XL) 10 MG 24 hr tablet Take 1 tablet (10 mg total) by mouth daily with breakfast.       06/15/2024    9:57 AM 02/12/2024   10:56 AM 12/08/2023   10:22 AM 12/08/2023   10:13 AM  GAD 7 : Generalized Anxiety Score  Nervous, Anxious, on Edge 0 0 0 0   Control/stop worrying 0 0 0 0  Worry too much - different things 0 0 0 0  Trouble relaxing 0 0 0 0  Restless 0 0 0 0  Easily annoyed or irritable 0 0 0 0  Afraid - awful might happen 0 0 0 0  Total GAD 7 Score 0 0 0 0  Anxiety Difficulty Not difficult at all Not difficult at all Not difficult at all Not difficult at all       06/15/2024    9:57 AM 02/12/2024   10:56 AM 12/08/2023   10:22 AM  Depression screen PHQ 2/9  Decreased Interest 0 0 0  Down, Depressed, Hopeless 0 0 0  PHQ - 2 Score 0 0 0  Altered sleeping 0  0  Tired, decreased energy 0  0  Change in appetite 0  0  Feeling bad or failure about yourself  0  0  Trouble concentrating 0  0  Moving slowly or fidgety/restless 0  0  Suicidal thoughts 0  0  PHQ-9 Score 0  0  Difficult doing work/chores Not difficult at all  Not difficult at all    BP Readings from Last 3 Encounters:  06/15/24 98/62  02/12/24 (!) 100/58  12/08/23 132/70    Physical Exam Vitals and nursing note reviewed.  Constitutional:      General: He is not in acute distress.    Appearance: Normal appearance. He is well-developed.  HENT:     Head: Normocephalic and atraumatic.   Cardiovascular:     Rate and Rhythm: Normal rate and regular rhythm.     Heart sounds: No murmur heard. Pulmonary:     Effort: Pulmonary effort is normal. No respiratory distress.     Breath sounds: No wheezing or rhonchi.   Musculoskeletal:     Cervical back: Normal range of motion.     Right lower leg: No edema.     Left lower leg: No edema.   Skin:    General: Skin is warm and dry.     Capillary Refill: Capillary refill takes less than 2 seconds.     Findings: Lesion (purulent papule on posterior left upper arm) present. No rash.   Neurological:     General: No focal deficit present.     Mental  Status: He is alert and oriented to person, place, and time.   Psychiatric:        Mood and Affect: Mood normal.        Behavior: Behavior normal.     Wt  Readings from Last 3 Encounters:  06/15/24 192 lb (87.1 kg)  02/12/24 192 lb (87.1 kg)  12/08/23 188 lb (85.3 kg)    BP 98/62   Pulse 91   Ht 5' 9 (1.753 m)   Wt 192 lb (87.1 kg)   SpO2 97%   BMI 28.35 kg/m   Assessment and Plan:  Problem List Items Addressed This Visit       Unprioritized   Coronary artery disease involving native coronary artery of native heart with angina pectoris (HCC) (Chronic)   No current chest pains Stopped ISDN due to dizziness. Last LDL-C 61 - resume Repatha       Relevant Medications   Evolocumab  (REPATHA  SURECLICK) 140 MG/ML SOAJ   isosorbide  mononitrate (IMDUR ) 30 MG 24 hr tablet   Other Relevant Orders   Lipid panel   Essential hypertension (Chronic)   Blood pressure is well controlled.  Current medications are ISDN, Coreg  and losartan . Will continue same regimen along with efforts to limit dietary sodium.       Relevant Medications   Evolocumab  (REPATHA  SURECLICK) 140 MG/ML SOAJ   isosorbide  mononitrate (IMDUR ) 30 MG 24 hr tablet   Other Relevant Orders   Basic metabolic panel with GFR   Type II diabetes mellitus with complication (HCC) - Primary (Chronic)   Blood sugars have been stable.  No recent hypoglycemic events requiring assistance. Currently medications are Insulin , MTF, Jardiance  and Tradjenta . Lab Results  Component Value Date   HGBA1C 8.6 (H) 02/12/2024   Last visit encouraged medication compliance.       Relevant Medications   glipiZIDE  (GLUCOTROL  XL) 10 MG 24 hr tablet   empagliflozin  (JARDIANCE ) 25 MG TABS tablet   Other Relevant Orders   Hemoglobin A1c   Other Visit Diagnoses       Long-term insulin  use (HCC)         Cellulitis of left upper extremity       after yellow jacket sting three days ago local care, cortisone topically for itching   Relevant Medications   doxycycline  (VIBRA -TABS) 100 MG tablet       Return in about 4 months (around 10/15/2024) for HTN, DM.    Sheron Dixons, MD [facility]  Health Primary Care and Sports Medicine [place]

## 2024-06-15 NOTE — Assessment & Plan Note (Addendum)
 No current chest pains Stopped ISDN due to dizziness. Last LDL-C 61 - resume Repatha 

## 2024-06-15 NOTE — Assessment & Plan Note (Signed)
 Blood sugars have been stable.  No recent hypoglycemic events requiring assistance. Currently medications are Insulin , MTF, Jardiance  and Tradjenta . Lab Results  Component Value Date   HGBA1C 8.6 (H) 02/12/2024   Last visit encouraged medication compliance.

## 2024-06-16 ENCOUNTER — Ambulatory Visit: Payer: Self-pay | Admitting: Internal Medicine

## 2024-06-16 LAB — LIPID PANEL
Chol/HDL Ratio: 5 ratio (ref 0.0–5.0)
Cholesterol, Total: 149 mg/dL (ref 100–199)
HDL: 30 mg/dL — ABNORMAL LOW (ref >39)
LDL Chol Calc (NIH): 59 mg/dL (ref 0–99)
Triglycerides: 391 mg/dL — ABNORMAL HIGH (ref 0–149)
VLDL Cholesterol Cal: 60 mg/dL — ABNORMAL HIGH (ref 5–40)

## 2024-06-16 LAB — HEMOGLOBIN A1C
Est. average glucose Bld gHb Est-mCnc: 171 mg/dL
Hgb A1c MFr Bld: 7.6 % — ABNORMAL HIGH (ref 4.8–5.6)

## 2024-06-16 LAB — BASIC METABOLIC PANEL WITH GFR
BUN/Creatinine Ratio: 30 — ABNORMAL HIGH (ref 10–24)
BUN: 54 mg/dL — ABNORMAL HIGH (ref 8–27)
CO2: 23 mmol/L (ref 20–29)
Calcium: 9.7 mg/dL (ref 8.6–10.2)
Chloride: 93 mmol/L — ABNORMAL LOW (ref 96–106)
Creatinine, Ser: 1.81 mg/dL — ABNORMAL HIGH (ref 0.76–1.27)
Glucose: 221 mg/dL — ABNORMAL HIGH (ref 70–99)
Potassium: 4.9 mmol/L (ref 3.5–5.2)
Sodium: 136 mmol/L (ref 134–144)
eGFR: 39 mL/min/{1.73_m2} — ABNORMAL LOW (ref >59)

## 2024-07-01 ENCOUNTER — Other Ambulatory Visit: Payer: Self-pay | Admitting: Internal Medicine

## 2024-07-01 DIAGNOSIS — E118 Type 2 diabetes mellitus with unspecified complications: Secondary | ICD-10-CM

## 2024-07-05 NOTE — Telephone Encounter (Signed)
 Requested Prescriptions  Pending Prescriptions Disp Refills   TRESIBA  FLEXTOUCH 100 UNIT/ML FlexTouch Pen [Pharmacy Med Name: TRESIBA  FLEXTOUCH PEN ([id number]] 15 mL 3    Sig: ADMINISTER 70 UNITS UNDER THE SKIN DAILY     Endocrinology:  Diabetes - Insulins Passed - 07/05/2024  2:42 PM      Passed - HBA1C is between 0 and 7.9 and within 180 days    Hemoglobin A1C  Date Value Ref Range Status  10/11/2014 6.9 (H) 4.2 - 6.3 % Final    Comment:    The American Diabetes Association recommends that a primary goal of therapy should be <7% and that physicians should reevaluate the treatment regimen in patients with HbA1c values consistently >8%.    Hgb A1c MFr Bld  Date Value Ref Range Status  06/15/2024 7.6 (H) 4.8 - 5.6 % Final    Comment:             Prediabetes: 5.7 - 6.4          Diabetes: >6.4          Glycemic control for adults with diabetes: <7.0          Passed - Valid encounter within last 6 months    Recent Outpatient Visits           2 weeks ago Type II diabetes mellitus with complication [facility])   [place] [facility] at [facility], Leita DEL, MD   4 months ago Annual physical exam   [facility] Primary [facility] & [facility] Medicine at [facility], Leita DEL, MD       Future Appointments             In 3 months Justus, Leita DEL, MD [facility] Primary Care & Sports Medicine at [facility], [facility]

## 2024-07-08 ENCOUNTER — Ambulatory Visit (INDEPENDENT_AMBULATORY_CARE_PROVIDER_SITE_OTHER): Admitting: Emergency Medicine

## 2024-07-08 VITALS — Ht 69.0 in | Wt 185.0 lb

## 2024-07-08 DIAGNOSIS — H9191 Unspecified hearing loss, right ear: Secondary | ICD-10-CM

## 2024-07-08 DIAGNOSIS — Z0001 Encounter for general adult medical examination with abnormal findings: Secondary | ICD-10-CM

## 2024-07-08 DIAGNOSIS — Z0000 Encounter for general adult medical examination without abnormal findings: Secondary | ICD-10-CM

## 2024-07-08 NOTE — Progress Notes (Signed)
 Subjective:   Gary Gutierrez is a 73 y.o. who presents for a Medicare Wellness preventive visit.  As a reminder, Annual Wellness Visits don't include a physical exam, and some assessments may be limited, especially if this visit is performed virtually. We may recommend an in-person follow-up visit with your provider if needed.  Visit Complete: Virtual I connected with  Gary Gutierrez on 07/08/24 by a audio enabled telemedicine application and verified that I am speaking with the correct person using two identifiers.  Patient Location: Home  Provider Location: Home Office  I discussed the limitations of evaluation and management by telemedicine. The patient expressed understanding and agreed to proceed.  Vital Signs: Because this visit was a virtual/telehealth visit, some criteria may be missing or patient reported. Any vitals not documented were not able to be obtained and vitals that have been documented are patient reported.  VideoDeclined- This patient declined Librarian, academic. Therefore the visit was completed with audio only.  Persons Participating in Visit: Patient.  AWV Questionnaire: No: Patient Medicare AWV questionnaire was not completed prior to this visit.  Cardiac Risk Factors include: advanced age (>11men, >68 women);male gender;diabetes mellitus;dyslipidemia;Other (see comment), Risk factor comments: CAD     Objective:    Today's Vitals   07/08/24 0801 07/08/24 0802  Weight: 185 lb (83.9 kg)   Height: 5' 9 (1.753 m)   PainSc:  4    Body mass index is 27.32 kg/m.     07/08/2024    8:21 AM 04/30/2023    8:29 AM 04/24/2022    8:37 AM 04/23/2021    9:13 AM 04/19/2020    9:02 AM 11/19/2019    4:30 AM 11/17/2019   11:40 AM  Advanced Directives  Does Patient Have a Medical Advance Directive? No No No No No No No  Would patient like information on creating a medical advance directive? No - Patient declined No - Patient declined  No - Patient declined No - Patient declined Yes (MAU/Ambulatory/Procedural Areas - Information given) No - Patient declined     Current Medications (verified) Outpatient Encounter Medications as of 07/08/2024  Medication Sig   allopurinol  (ZYLOPRIM ) 300 MG tablet Take 1 tablet (300 mg total) by mouth daily.   atorvastatin  (LIPITOR ) 80 MG tablet TAKE 1 TABLET(80 MG) BY MOUTH AT BEDTIME   budesonide -formoterol  (SYMBICORT ) 160-4.5 MCG/ACT inhaler Inhale 1 puff into the lungs 2 (two) times daily. (Patient taking differently: Inhale 1 puff into the lungs as needed. prn)   carvedilol  (COREG ) 6.25 MG tablet Take 6.25 mg by mouth 2 (two) times daily with a meal.   Cholecalciferol (VITAMIN D3 PO) Take by mouth.   clopidogrel  (PLAVIX ) 75 MG tablet TAKE 1 TABLET(75 MG) BY MOUTH DAILY   empagliflozin  (JARDIANCE ) 25 MG TABS tablet Take 1 tablet (25 mg total) by mouth daily.   Evolocumab  (REPATHA  SURECLICK) 140 MG/ML SOAJ Inject 140 mg into the skin every 14 (fourteen) days.   glipiZIDE  (GLUCOTROL  XL) 10 MG 24 hr tablet Take 1 tablet (10 mg total) by mouth daily with breakfast.   glucose blood (ACCU-CHEK AVIVA PLUS) test strip TEST BLOOD SUGAR DAILY AS DIRECTED UP TO FOUR TIMES DAILY   Insulin  Pen Needle (PEN NEEDLES 31GX5/16) 31G X 8 MM MISC 1 each by Does not apply route daily.   isosorbide  mononitrate (IMDUR ) 30 MG 24 hr tablet Take 1 tablet (30 mg total) by mouth daily.   linagliptin  (TRADJENTA ) 5 MG TABS tablet Take 1 tablet (5  mg total) by mouth daily.   losartan  (COZAAR ) 50 MG tablet TAKE 1 TABLET(50 MG) BY MOUTH DAILY (Patient taking differently: 1/2 tablet twice a day)   nitroGLYCERIN  (NITROSTAT ) 0.4 MG SL tablet Place 1 tablet (0.4 mg total) under the tongue every 5 (five) minutes as needed for chest pain.   pantoprazole  (PROTONIX ) 40 MG tablet TAKE 1 TABLET(40 MG) BY MOUTH TWICE DAILY   potassium chloride  SA (K-DUR,KLOR-CON ) 20 MEQ tablet TAKE 2 TABLETS(40 MEQ) BY MOUTH DAILY   pregabalin   (LYRICA ) 50 MG capsule Take 1 capsule (50 mg total) by mouth 2 (two) times daily.   torsemide  (DEMADEX ) 100 MG tablet Take by mouth 2 (two) times daily.    TRESIBA  FLEXTOUCH 100 UNIT/ML FlexTouch Pen ADMINISTER 70 UNITS UNDER THE SKIN DAILY   Ascorbic Acid  (VITAMIN C ) 1000 MG tablet Take 1,000 mg by mouth daily. (Patient not taking: Reported on 07/08/2024)   Continuous Glucose Sensor (DEXCOM G7 SENSOR) MISC Place one sensor every 10 days (Patient not taking: Reported on 07/08/2024)   zinc  gluconate 50 MG tablet Take 50 mg by mouth daily. (Patient not taking: Reported on 07/08/2024)   No facility-administered encounter medications on file as of 07/08/2024.    Allergies (verified) Entresto  [sacubitril -valsartan ] and Penicillins   History: Past Medical History:  Diagnosis Date   Acute respiratory failure due to COVID-19 [facility]) 11/19/2019   Allergy    CHF (congestive heart failure) (HCC)    Coronary artery disease    Diabetes mellitus without complication (HCC)    Hyperlipidemia    Hypertension    Myocardial infarction (HCC)    Pneumonia due to COVID-19 virus 11/19/2019   Past Surgical History:  Procedure Laterality Date   APPENDECTOMY     colonscopy  2010   benign polyps   CORONARY ARTERY BYPASS GRAFT  1995   CORONARY STENT INTERVENTION N/A 02/03/2017   Procedure: Coronary Stent Intervention;  Surgeon: Cara JONETTA Lovelace, MD;  Location: [facility] INVASIVE CV LAB;  Service: Cardiovascular;  Laterality: N/A;   ESOPHAGOGASTRODUODENOSCOPY  2010   normal   LEFT HEART CATH AND CORONARY ANGIOGRAPHY N/A 02/03/2017   Procedure: Left Heart Cath and Coronary Angiography;  Surgeon: Cara JONETTA Lovelace, MD;  Location: [facility] INVASIVE CV LAB;  Service: Cardiovascular;  Laterality: N/A;   PERCUTANEOUS CORONARY STENT INTERVENTION (PCI-S)  2010, 2015   4 stents total   Family History  Problem Relation Age of Onset   Heart disease Father    Lung cancer Brother    Prostate cancer Brother    Diabetes Mother     Heart disease Mother    Heart attack Mother    Congestive Heart Failure Mother    Social History   Socioeconomic History   Marital status: Married    Spouse name: Gary Gutierrez   Number of children: 8   Years of education: Not on file   Highest education level: 11th grade  Occupational History   Occupation: Retired  Tobacco Use   Smoking status: Former    Current packs/day: 0.00    Average packs/day: 1.5 packs/day for 29.0 years (43.5 ttl pk-yrs)    Types: Cigarettes    Start date: 12/30/1965    Quit date: 12/30/1994    Years since quitting: 29.5   Smokeless tobacco: Never   Tobacco comments:    smoking cessation materials not required  Vaping Use   Vaping status: Never Used  Substance and Sexual Activity   Alcohol use: No    Alcohol/week: 0.0 standard drinks of alcohol  Drug use: No   Sexual activity: Not Currently  Other Topics Concern   Not on file  Social History Narrative   Not on file   Social Drivers of Health   Financial Resource Strain: Low Risk  (07/08/2024)   Overall Financial Resource Strain (CARDIA)    Difficulty of Paying Living Expenses: Not hard at all  Food Insecurity: No Food Insecurity (07/08/2024)   Hunger Vital Sign    Worried About Running Out of Food in the Last Year: Never true    Ran Out of Food in the Last Year: Never true  Transportation Needs: No Transportation Needs (07/08/2024)   PRAPARE - Administrator, Civil Service (Medical): No    Lack of Transportation (Non-Medical): No  Physical Activity: Inactive (07/08/2024)   Exercise Vital Sign    Days of Exercise per Week: 0 days    Minutes of Exercise per Session: 0 min  Stress: No Stress Concern Present (07/08/2024)   [employer] of Occupational Health - Occupational Stress Questionnaire    Feeling of Stress: Not at all  Social Connections: Moderately Integrated (07/08/2024)   Social Connection and Isolation Panel    Frequency of Communication with Friends and Family: More than  three times a week    Frequency of Social Gatherings with Friends and Family: More than three times a week    Attends Religious Services: More than 4 times per year    Active Member of [employer] or Organizations: No    Attends Engineer, structural: Never    Marital Status: Married    Tobacco Counseling Counseling given: Not Answered Tobacco comments: smoking cessation materials not required    Clinical Intake:  Pre-visit preparation completed: Yes  Pain : 0-10 Pain Score: 4  Pain Type: Chronic pain Pain Location: Back Pain Descriptors / Indicators: Aching     BMI - recorded: 27.32 Nutritional Status: BMI 25 -29 Overweight Nutritional Risks: None Diabetes: Yes CBG done?: No Did pt. bring in CBG monitor from home?: No  Lab Results  Component Value Date   HGBA1C 7.6 (H) 06/15/2024   HGBA1C 8.6 (H) 02/12/2024   HGBA1C 6.9 (A) 09/10/2023     How often do you need to have someone help you when you read instructions, pamphlets, or other written materials from your doctor or pharmacy?: 1 - Never  Interpreter Needed?: No  Information entered by :: Vina Ned, CMA   Activities of Daily Living     07/08/2024    8:07 AM  In your present state of health, do you have any difficulty performing the following activities:  Hearing? 1  Comment hearing loss right ear  Vision? 0  Difficulty concentrating or making decisions? 0  Walking or climbing stairs? 1  Comment uses a cane  Dressing or bathing? 0  Doing errands, shopping? 0  Preparing Food and eating ? N  Using the Toilet? N  In the past six months, have you accidently leaked urine? N  Do you have problems with loss of bowel control? N  Managing your Medications? N  Managing your Finances? N  Housekeeping or managing your Housekeeping? N    Patient Care Team: Justus Leita DEL, MD as PCP - General (Internal Medicine) Jinny Carmine, MD as Consulting Physician (Gastroenterology) Florencio Cara BIRCH, MD as  Consulting Physician (Cardiology) Enola Feliciano Hugger, MD as Consulting Physician (Ophthalmology)  I have updated your Care Teams any recent Medical Services you may have received from other providers in the past  year.     Assessment:   This is a routine wellness examination for Stacey.  Hearing/Vision screen Hearing Screening - Comments:: Hearing loss right ear, placed ENT referral Vision Screening - Comments:: Gets DM eye exam, Dr. Feliciano Ober, [place]   Goals Addressed               This Visit's Progress     Increase physical activity (pt-stated)        Join a gym       Depression Screen     07/08/2024    8:17 AM 06/15/2024    9:57 AM 02/12/2024   10:56 AM 12/08/2023   10:22 AM 12/08/2023   10:13 AM 09/10/2023   10:32 AM 04/30/2023    8:27 AM  PHQ 2/9 Scores  PHQ - 2 Score 0 0 0 0 0 0 0  PHQ- 9 Score 1 0  0 0 0 0    Fall Risk     07/08/2024    8:25 AM 06/15/2024    9:56 AM 02/12/2024   10:56 AM 12/08/2023   10:22 AM 12/08/2023   10:13 AM  Fall Risk   Falls in the past year? 0 0 0 0 0  Number falls in past yr: 0 0 0 0 0  Injury with Fall? 0 0 0 0 0  Risk for fall due to : Impaired balance/gait;Impaired mobility;Orthopedic patient No Fall Risks No Fall Risks No Fall Risks No Fall Risks  Follow up Education provided;Falls evaluation completed Falls evaluation completed Falls evaluation completed Falls evaluation completed Falls evaluation completed    MEDICARE RISK AT HOME:  Medicare Risk at Home Any stairs in or around the home?: Yes (also has a ramp) If so, are there any without handrails?: No Home free of loose throw rugs in walkways, pet beds, electrical cords, etc?: Yes Adequate lighting in your home to reduce risk of falls?: Yes Life alert?: No Use of a cane, walker or w/c?: Yes (cane) Grab bars in the bathroom?: No Shower chair or bench in shower?: No Elevated toilet seat or a handicapped toilet?: Yes  TIMED UP AND GO:  Was the test performed?   No  Cognitive Function: 6CIT completed        07/08/2024    8:27 AM 04/30/2023    8:40 AM 04/24/2022    8:47 AM 04/23/2021    9:18 AM 04/19/2020    9:06 AM  6CIT Screen  What Year? 0 points 0 points 0 points 0 points 0 points  What month? 0 points 0 points 0 points 0 points 0 points  What time? 0 points 0 points 0 points 0 points 0 points  Count back from 20 0 points 0 points 0 points 0 points 0 points  Months in reverse 4 points 0 points 2 points 4 points 4 points  Repeat phrase 0 points 0 points 0 points 2 points 2 points  Total Score 4 points 0 points 2 points 6 points 6 points    Immunizations Immunization History  Administered Date(s) Administered   Fluad Quad(high Dose 65+) 10/18/2019, 09/28/2020, 12/04/2022   Fluad Trivalent(High Dose 65+) 09/10/2023   Influenza, High Dose Seasonal PF 10/10/2017, 10/27/2018   Influenza,inj,Quad PF,6+ Mos 01/03/2016   PNEUMOCOCCAL CONJUGATE-20 12/04/2022   Pneumococcal Conjugate-13 04/10/2016   Pneumococcal Polysaccharide-23 06/10/2017   Tdap 08/16/2013, 04/23/2021    Screening Tests Health Maintenance  Topic Date Due   Zoster Vaccines- Shingrix (1 of 2) Never done   Colonoscopy  06/19/2023   INFLUENZA VACCINE  07/30/2024   HEMOGLOBIN A1C  12/15/2024   Diabetic kidney evaluation - Urine ACR  02/11/2025   FOOT EXAM  02/11/2025   OPHTHALMOLOGY EXAM  05/17/2025   Diabetic kidney evaluation - eGFR measurement  06/15/2025   Medicare Annual Wellness (AWV)  07/08/2025   DTaP/Tdap/Td (3 - Td or Tdap) 04/24/2031   Pneumococcal Vaccine: 50+ Years  Completed   Hepatitis C Screening  Completed   Hepatitis B Vaccines  Aged Out   HPV VACCINES  Aged Out   Meningococcal B Vaccine  Aged Out   COVID-19 Vaccine  Discontinued    Health Maintenance  Health Maintenance Due  Topic Date Due   Zoster Vaccines- Shingrix (1 of 2) Never done   Colonoscopy  06/19/2023   Health Maintenance Items Addressed: See Nurse Notes at the end of this  note  Additional Screening:  Vision Screening: Recommended annual ophthalmology exams for early detection of glaucoma and other disorders of the eye. Would you like a referral to an eye doctor? No    Dental Screening: Recommended annual dental exams for proper oral hygiene  Community Resource Referral / Chronic Care Management: CRR required this visit?  No   CCM required this visit?  No   Plan:    I have personally reviewed and noted the following in the patient's chart:   Medical and social history Use of alcohol, tobacco or illicit drugs  Current medications and supplements including opioid prescriptions. Patient is not currently taking opioid prescriptions. Functional ability and status Nutritional status Physical activity Advanced directives List of other physicians Hospitalizations, surgeries, and ER visits in previous 12 months Vitals Screenings to include cognitive, depression, and falls Referrals and appointments  In addition, I have reviewed and discussed with patient certain preventive protocols, quality metrics, and best practice recommendations. A written personalized care plan for preventive services as well as general preventive health recommendations were provided to patient.   Vina Ned, CMA   07/08/2024   After Visit Summary: (MyChart) Due to this being a telephonic visit, the after visit summary with patients personalized plan was offered to patient via MyChart   Notes:  6 CIT Score - 4 Placed referral to ENT for hearing loss right ear Declined DM & Nutrition education referral Declined Covid and Shingrix vaccines Declined colonoscopy

## 2024-07-08 NOTE — Patient Instructions (Signed)
 Gary Gutierrez , Thank you for taking time out of your busy schedule to complete your Annual Wellness Visit with me. I enjoyed our conversation and look forward to speaking with you again next year. I, as well as your care team,  appreciate your ongoing commitment to your health goals. Please review the following plan we discussed and let me know if I can assist you in the future. Your Game plan/ To Do List    Referrals: If you haven't heard from the office you've been referred to, please reach out to them at the phone provided.  I have placed a referral to [facility] for a hearing evaluation. Phone: [phone] [place] office  Follow up Visits: Next Medicare AWV with our clinical staff: 07/21/25 8:00am (PHONE VISIT)   Have you seen your provider in the last 6 months (3 months if uncontrolled diabetes)? Yes Next Office Visit with your provider: 10/15/24 10:00am with Dr. Justus  Clinician Recommendations:  Aim for 30 minutes of exercise or brisk walking, 6-8 glasses of water, and 5 servings of fruits and vegetables each day.       This is a list of the screening recommended for you and due dates:  Health Maintenance  Topic Date Due   Zoster (Shingles) Vaccine (1 of 2) Never done   Colon Cancer Screening  06/19/2023   Flu Shot  07/30/2024   Hemoglobin A1C  12/15/2024   Yearly kidney health urinalysis for diabetes  02/11/2025   Complete foot exam   02/11/2025   Eye exam for diabetics  05/17/2025   Yearly kidney function blood test for diabetes  06/15/2025   Medicare Annual Wellness Visit  07/08/2025   DTaP/Tdap/Td vaccine (3 - Td or Tdap) 04/24/2031   Pneumococcal Vaccine for age over 38  Completed   Hepatitis C Screening  Completed   Hepatitis B Vaccine  Aged Out   HPV Vaccine  Aged Out   Meningitis B Vaccine  Aged Out   COVID-19 Vaccine  Discontinued    Advanced directives: (Declined) Advance directive discussed with you today. Even though you declined this today, please call our  office should you change your mind, and we can give you the proper paperwork for you to fill out. Advance Care Planning is important because it:  [x]  Makes sure you receive the medical care that is consistent with your values, goals, and preferences  [x]  It provides guidance to your family and loved ones and reduces their decisional burden about whether or not they are making the right decisions based on your wishes.  Follow the link provided in your after visit summary or read over the paperwork we have mailed to you to help you started getting your Advance Directives in place. If you need assistance in completing these, please reach out to us  so that we can help you!  See attachments for Preventive Care and Fall Prevention Tips.   Fall Prevention in the Home, Adult Falls can cause injuries and affect people of all ages. There are many simple things that you can do to make your home safe and to help prevent falls. If you need it, ask for help making these changes. What actions can I take to prevent falls? General information Use good lighting in all rooms. Make sure to: Replace any light bulbs that burn out. Turn on lights if it is dark and use night-lights. Keep items that you use often in easy-to-reach places. Lower the shelves around your home if needed. Move furniture so  that there are clear paths around it. Do not keep throw rugs or other things on the floor that can make you trip. If any of your floors are uneven, fix them. Add color or contrast paint or tape to clearly mark and help you see: Grab bars or handrails. First and last steps of staircases. Where the edge of each step is. If you use a ladder or stepladder: Make sure that it is fully opened. Do not climb a closed ladder. Make sure the sides of the ladder are locked in place. Have someone hold the ladder while you use it. Know where your pets are as you move through your home. What can I do in the bathroom?      Keep the floor dry. Clean up any water that is on the floor right away. Remove soap buildup in the bathtub or shower. Buildup makes bathtubs and showers slippery. Use non-skid mats or decals on the floor of the bathtub or shower. Attach bath mats securely with double-sided, non-slip rug tape. If you need to sit down while you are in the shower, use a non-slip stool. Install grab bars by the toilet and in the bathtub and shower. Do not use towel bars as grab bars. What can I do in the bedroom? Make sure that you have a light by your bed that is easy to reach. Do not use any sheets or blankets on your bed that hang to the floor. Have a firm bench or chair with side arms that you can use for support when you get dressed. What can I do in the kitchen? Clean up any spills right away. If you need to reach something above you, use a sturdy step stool that has a grab bar. Keep electrical cables out of the way. Do not use floor polish or wax that makes floors slippery. What can I do with my stairs? Do not leave anything on the stairs. Make sure that you have a light switch at the top and the bottom of the stairs. Have them installed if you do not have them. Make sure that there are handrails on both sides of the stairs. Fix handrails that are broken or loose. Make sure that handrails are as long as the staircases. Install non-slip stair treads on all stairs in your home if they do not have carpet. Avoid having throw rugs at the top or bottom of stairs, or secure the rugs with carpet tape to prevent them from moving. Choose a carpet design that does not hide the edge of steps on the stairs. Make sure that carpet is firmly attached to the stairs. Fix any carpet that is loose or worn. What can I do on the outside of my home? Use bright outdoor lighting. Repair the edges of walkways and driveways and fix any cracks. Clear paths of anything that can make you trip, such as tools or rocks. Add color or  contrast paint or tape to clearly mark and help you see high doorway thresholds. Trim any bushes or trees on the main path into your home. Check that handrails are securely fastened and in good repair. Both sides of all steps should have handrails. Install guardrails along the edges of any raised decks or porches. Have leaves, snow, and ice cleared regularly. Use sand, salt, or ice melt on walkways during winter months if you live where there is ice and snow. In the garage, clean up any spills right away, including grease or oil spills. What other  actions can I take? Review your medicines with your health care provider. Some medicines can make you confused or feel dizzy. This can increase your chance of falling. Wear closed-toe shoes that fit well and support your feet. Wear shoes that have rubber soles and low heels. Use a cane, walker, scooter, or crutches that help you move around if needed. Talk with your provider about other ways that you can decrease your risk of falls. This may include seeing a physical therapist to learn to do exercises to improve movement and strength. Where to find more information Centers for Disease Control and Prevention, STEADI: TonerPromos.no [employer] on Aging: BaseRingTones.pl National Institute on Aging: BaseRingTones.pl Contact a health care provider if: You are afraid of falling at home. You feel weak, drowsy, or dizzy at home. You fall at home. Get help right away if you: Lose consciousness or have trouble moving after a fall. Have a fall that causes a head injury. These symptoms may be an emergency. Get help right away. Call 911. Do not wait to see if the symptoms will go away. Do not drive yourself to the hospital. This information is not intended to replace advice given to you by your health care provider. Make sure you discuss any questions you have with your health care provider. Document Revised: 08/19/2022 Document Reviewed: 08/19/2022 Elsevier Patient  Education  2024 [employer].

## 2024-08-01 ENCOUNTER — Other Ambulatory Visit: Payer: Self-pay | Admitting: Internal Medicine

## 2024-08-01 DIAGNOSIS — I25119 Atherosclerotic heart disease of native coronary artery with unspecified angina pectoris: Secondary | ICD-10-CM

## 2024-08-03 NOTE — Telephone Encounter (Signed)
 Requested Prescriptions  Pending Prescriptions Disp Refills   clopidogrel  (PLAVIX ) 75 MG tablet [Pharmacy Med Name: CLOPIDOGREL  75MG  TABLETS] 90 tablet 0    Sig: TAKE 1 TABLET(75 MG) BY MOUTH DAILY     Hematology: Antiplatelets - clopidogrel  Failed - 08/03/2024 10:07 AM      Failed - Cr in normal range and within 360 days    Creatinine  Date Value Ref Range Status  10/11/2014 1.19 0.60 - 1.30 mg/dL Final   Creatinine, Ser  Date Value Ref Range Status  06/15/2024 1.81 (H) 0.76 - 1.27 mg/dL Final         Passed - HCT in normal range and within 180 days    Hematocrit  Date Value Ref Range Status  02/12/2024 43.4 37.5 - 51.0 % Final         Passed - HGB in normal range and within 180 days    Hemoglobin  Date Value Ref Range Status  02/12/2024 14.2 13.0 - 17.7 g/dL Final         Passed - PLT in normal range and within 180 days    Platelets  Date Value Ref Range Status  02/12/2024 203 150 - 450 x10E3/uL Final         Passed - Valid encounter within last 6 months    Recent Outpatient Visits           1 month ago Type II diabetes mellitus with complication [facility])   [place] [facility] at [facility], Leita DEL, MD   5 months ago Annual physical exam   [facility] at [facility], Leita DEL, MD       Future Appointments             In 2 months Justus, Leita DEL, MD [facility] Health Primary Care & Sports Medicine at [facility]

## 2024-08-19 ENCOUNTER — Ambulatory Visit: Payer: Self-pay
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
## 2024-08-19 NOTE — Telephone Encounter (Signed)
 Noted  Pt has a appt.  KP

## 2024-08-20 ENCOUNTER — Ambulatory Visit: Admitting: Internal Medicine

## 2024-08-20 ENCOUNTER — Encounter: Payer: Self-pay | Admitting: Internal Medicine

## 2024-08-20 VITALS — BP 122/74 | HR 88 | Ht 69.0 in | Wt 193.0 lb

## 2024-08-20 DIAGNOSIS — E1169 Type 2 diabetes mellitus with other specified complication: Secondary | ICD-10-CM | POA: Diagnosis not present

## 2024-08-20 DIAGNOSIS — K219 Gastro-esophageal reflux disease without esophagitis: Secondary | ICD-10-CM | POA: Diagnosis not present

## 2024-08-20 DIAGNOSIS — E785 Hyperlipidemia, unspecified: Secondary | ICD-10-CM

## 2024-08-20 DIAGNOSIS — Z7984 Long term (current) use of oral hypoglycemic drugs: Secondary | ICD-10-CM

## 2024-08-20 DIAGNOSIS — I10 Essential (primary) hypertension: Secondary | ICD-10-CM

## 2024-08-20 DIAGNOSIS — M7021 Olecranon bursitis, right elbow: Secondary | ICD-10-CM | POA: Diagnosis not present

## 2024-08-20 DIAGNOSIS — I25119 Atherosclerotic heart disease of native coronary artery with unspecified angina pectoris: Secondary | ICD-10-CM

## 2024-08-20 MED ORDER — ATORVASTATIN CALCIUM 80 MG PO TABS: 80.0000 mg | ORAL_TABLET | Freq: Every day | ORAL | 1 refills | Status: DC

## 2024-08-20 MED ORDER — PANTOPRAZOLE SODIUM 40 MG PO TBEC: DELAYED_RELEASE_TABLET | ORAL | 1 refills | Status: DC

## 2024-08-20 MED ORDER — LOSARTAN POTASSIUM 25 MG PO TABS: 25.0000 mg | ORAL_TABLET | Freq: Two times a day (BID) | ORAL | 1 refills | Status: DC

## 2024-08-20 MED ORDER — NITROGLYCERIN 0.4 MG SL SUBL: 0.4000 mg | SUBLINGUAL_TABLET | SUBLINGUAL | 1 refills | Status: DC | PRN

## 2024-08-20 NOTE — Assessment & Plan Note (Signed)
 Blood pressure is well controlled on losartan , spironolactone, and Coreg . No medication side effects noted. Plan to continue current medications.
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
## 2024-08-23 ENCOUNTER — Encounter: Admitting: Family Medicine

## 2024-09-20 ENCOUNTER — Other Ambulatory Visit: Payer: Self-pay | Admitting: Internal Medicine

## 2024-09-20 DIAGNOSIS — J189 Pneumonia, unspecified organism: Secondary | ICD-10-CM

## 2024-09-20 DIAGNOSIS — I25119 Atherosclerotic heart disease of native coronary artery with unspecified angina pectoris: Secondary | ICD-10-CM

## 2024-09-20 DIAGNOSIS — M539 Dorsopathy, unspecified: Secondary | ICD-10-CM

## 2024-09-20 NOTE — Telephone Encounter (Unsigned)
 Copied from CRM #[id number]. Topic: Clinical - Medication Refill >> Sep 20, 2024 10:34 AM [place] wrote: Medication: potassium chloride  SA (K-DUR,KLOR-CON ) 20 MEQ tablet:   Has the patient contacted their pharmacy? Yes (Agent: If no, request that the patient contact the pharmacy for the refill. If patient does not wish to contact the pharmacy document the reason why and proceed with request.) (Agent: If yes, when and what did the pharmacy advise?)  This is the patient's preferred pharmacy:  [id number] [place] - [address] AT [facility] [address] [place] [address] Phone: [phone] Fax: [phone]  Is this the correct pharmacy for this prescription? Yes If no, delete pharmacy and type the correct one.   Has the prescription been filled recently? Yes  Is the patient out of the medication? Yes  Has the patient been seen for an appointment in the last year OR does the patient have an upcoming appointment? Yes  Can we respond through MyChart? Yes  Agent: Please be advised that Rx refills may take up to 3 business days. We ask that you follow-up with your pharmacy.

## 2024-09-21 NOTE — Telephone Encounter (Signed)
 Requested Prescriptions  Refused Prescriptions Disp Refills   albuterol  (VENTOLIN  HFA) 108 (90 Base) MCG/ACT inhaler [Pharmacy Med Name: ALBUTEROL  HFA INH (200 PUFFS) 6.7GM] 6.7 g     Sig: INHALE 2 PUFFS INTO THE LUNGS EVERY 6 HOURS AS NEEDED FOR WHEEZING OR SHORTNESS OF BREATH     Pulmonology:  Beta Agonists 2 Passed - 09/21/2024 10:41 AM      Passed - Last BP in normal range    BP Readings from Last 1 Encounters:  08/20/24 122/74         Passed - Last Heart Rate in normal range    Pulse Readings from Last 1 Encounters:  08/20/24 88         Passed - Valid encounter within last 12 months    Recent Outpatient Visits           1 month ago Olecranon bursitis of right elbow   [place] [facility] at [facility], Leita DEL, MD   3 months ago Type II diabetes mellitus with complication [facility])   [place] [facility] at [facility], Leita DEL, MD   7 months ago Annual physical exam   [facility] Primary Care & Sports Medicine at [facility], Leita DEL, MD       Future Appointments             In 3 weeks Justus Leita DEL, MD [facility] Primary Care & Sports Medicine at [facility], [phone] [place]             isosorbide  mononitrate (IMDUR ) 30 MG 24 hr tablet [Pharmacy Med Name: ISOSORBIDE  MONONITRATE 30MG  ER TABS] 90 tablet 1    Sig: TAKE 1 TABLET(30 MG) BY MOUTH DAILY     Cardiovascular:  Nitrates Passed - 09/21/2024 10:41 AM      Passed - Last BP in normal range    BP Readings from Last 1 Encounters:  08/20/24 122/74         Passed - Last Heart Rate in normal range    Pulse Readings from Last 1 Encounters:  08/20/24 88         Passed - Valid encounter within last 12 months    Recent Outpatient Visits           1 month ago Olecranon bursitis of right elbow   [place] [facility] at [facility], Leita DEL, MD   3 months ago Type II diabetes mellitus  with complication [facility])   [place] [facility] Medicine at [facility], Leita DEL, MD   7 months ago Annual physical exam   [facility] Primary Care & Sports Medicine at [facility], Leita DEL, MD       Future Appointments             In 3 weeks Justus Leita DEL, MD [facility] Primary Care [facility] Sports Medicine at [facility], [phone] [place]             ibuprofen  (ADVIL ) 800 MG tablet [Pharmacy Med Name: IBUPROFEN  800MG  TABLETS] 30 tablet 2    Sig: TAKE 1 TABLET(800 MG) BY MOUTH DAILY AS NEEDED     Analgesics:  NSAIDS Failed - 09/21/2024 10:41 AM      Failed - Manual Review: Labs are only required if the patient has taken medication for more than 8 weeks.      Failed - Cr  in normal range and within 360 days    Creatinine  Date Value Ref Range Status  10/11/2014 1.19 0.60 - 1.30 mg/dL Final   Creatinine, Ser  Date Value Ref Range Status  06/15/2024 1.81 (H) 0.76 - 1.27 mg/dL Final         Passed - HGB in normal range and within 360 days    Hemoglobin  Date Value Ref Range Status  02/12/2024 14.2 13.0 - 17.7 g/dL Final         Passed - PLT in normal range and within 360 days    Platelets  Date Value Ref Range Status  02/12/2024 203 150 - 450 x10E3/uL Final         Passed - HCT in normal range and within 360 days    Hematocrit  Date Value Ref Range Status  02/12/2024 43.4 37.5 - 51.0 % Final         Passed - eGFR is 30 or above and within 360 days    EGFR (African American)  Date Value Ref Range Status  10/11/2014 >60 >47mL/min Final   GFR calc Af Amer  Date Value Ref Range Status  02/14/2021 54 (L) >59 mL/min/1.73 Final    Comment:    **In accordance with recommendations from the NKF-ASN Task force,**   [facility] is in the process of updating its eGFR calculation to the   2021 CKD-EPI creatinine equation that estimates kidney function   without a race variable.    EGFR (Non-African Amer.)  Date Value Ref Range Status   10/11/2014 >60 >71mL/min Final    Comment:    eGFR values <27mL/min/1.73 m2 may be an indication of chronic kidney disease (CKD). Calculated eGFR, using the MRDR Study equation, is useful in  patients with stable renal function. The eGFR calculation will not be reliable in acutely ill patients when serum creatinine is changing rapidly. It is not useful in patients on dialysis. The eGFR calculation may not be applicable to patients at the low and high extremes of body sizes, pregnant women, and vegetarians.    GFR calc non Af Amer  Date Value Ref Range Status  02/14/2021 47 (L) >59 mL/min/1.73 Final   eGFR  Date Value Ref Range Status  06/15/2024 39 (L) >59 mL/min/1.73 Final         Passed - Patient is not pregnant      Passed - Valid encounter within last 12 months    Recent Outpatient Visits           1 month ago Olecranon bursitis of right elbow   [place] [facility] Medicine at [facility], Leita DEL, MD   3 months ago Type II diabetes mellitus with complication [facility])   [place] [facility] Medicine at [facility], Leita DEL, MD   7 months ago Annual physical exam   [facility] Primary [facility] & Sports Medicine at [facility], Leita DEL, MD       Future Appointments             In 3 weeks Justus Leita DEL, MD [facility] Primary Care & Sports Medicine at [facility], [phone] [place]

## 2024-09-21 NOTE — Telephone Encounter (Signed)
 Requested medications are due for refill today.  unsure  Requested medications are on the active medications list.  yes  Last refill. 11/01/2018 #180 3   Future visit scheduled.   yes  Notes to clinic.  Last refill was 6 years ago. Rx signed by Ellouise Class.    Requested Prescriptions  Pending Prescriptions Disp Refills   potassium chloride  SA (KLOR-CON  M) 20 MEQ tablet 180 tablet 3     Endocrinology:  Minerals - Potassium Supplementation Failed - 09/21/2024 11:51 AM      Failed - Cr in normal range and within 360 days    Creatinine  Date Value Ref Range Status  10/11/2014 1.19 0.60 - 1.30 mg/dL Final   Creatinine, Ser  Date Value Ref Range Status  06/15/2024 1.81 (H) 0.76 - 1.27 mg/dL Final         Passed - K in normal range and within 360 days    Potassium  Date Value Ref Range Status  06/15/2024 4.9 3.5 - 5.2 mmol/L Final  10/11/2014 4.1 3.5 - 5.1 mmol/L Final         Passed - Valid encounter within last 12 months    Recent Outpatient Visits           1 month ago Olecranon bursitis of right elbow   [place] [facility] at [facility], Leita DEL, MD   3 months ago Type II diabetes mellitus with complication [facility])   [place] Primary [facility] Sports Medicine at [facility], Leita DEL, MD   7 months ago Annual physical exam   [facility] at [facility], Leita DEL, MD       Future Appointments             In 3 weeks Justus Leita DEL, MD [facility] Primary Care & Sports Medicine at [facility], [phone] [place]

## 2024-09-21 NOTE — Telephone Encounter (Signed)
 Please review.  KP

## 2024-10-11 ENCOUNTER — Other Ambulatory Visit: Payer: Self-pay | Admitting: Internal Medicine

## 2024-10-11 DIAGNOSIS — E118 Type 2 diabetes mellitus with unspecified complications: Secondary | ICD-10-CM

## 2024-10-13 NOTE — Telephone Encounter (Signed)
 Requested Prescriptions  Pending Prescriptions Disp Refills   insulin  degludec (TRESIBA  FLEXTOUCH) 100 UNIT/ML FlexTouch Pen [Pharmacy Med Name: TRESIBA  FLEXTOUCH PEN (U-100)[id number]] 15 mL 0    Sig: INJECT 50 UNITS INTO THE SKIN EVERY MORNING AND 10 UNITS EVERY EVENING     Endocrinology:  Diabetes - Insulins Passed - 10/13/2024 12:37 PM      Passed - HBA1C is between 0 and 7.9 and within 180 days    Hemoglobin A1C  Date Value Ref Range Status  10/11/2014 6.9 (H) 4.2 - 6.3 % Final    Comment:    The American Diabetes Association recommends that a primary goal of therapy should be <7% and that physicians should reevaluate the treatment regimen in patients with HbA1c values consistently >8%.    Hgb A1c MFr Bld  Date Value Ref Range Status  06/15/2024 7.6 (H) 4.8 - 5.6 % Final    Comment:             Prediabetes: 5.7 - 6.4          Diabetes: >6.4          Glycemic control for adults with diabetes: <7.0          Passed - Valid encounter within last 6 months    Recent Outpatient Visits           1 month ago Olecranon bursitis of right elbow   [place] [facility] Medicine at [facility], Leita DEL, MD   4 months ago Type II diabetes mellitus with complication [facility])   [place] Primary Care & Sports Medicine at [facility], Leita DEL, MD   8 months ago Annual physical exam   [facility] Primary Care & Sports Medicine at [facility], Leita DEL, MD       Future Appointments             In 2 days Justus Leita DEL, MD [facility] Health Primary Care & Sports Medicine at [facility], [phone] [place]

## 2024-10-14 ENCOUNTER — Other Ambulatory Visit: Payer: Self-pay | Admitting: Internal Medicine

## 2024-10-14 DIAGNOSIS — I25119 Atherosclerotic heart disease of native coronary artery with unspecified angina pectoris: Secondary | ICD-10-CM

## 2024-10-14 DIAGNOSIS — M109 Gout, unspecified: Secondary | ICD-10-CM

## 2024-10-14 DIAGNOSIS — E118 Type 2 diabetes mellitus with unspecified complications: Secondary | ICD-10-CM

## 2024-10-15 ENCOUNTER — Encounter: Payer: Self-pay | Admitting: Internal Medicine

## 2024-10-15 ENCOUNTER — Ambulatory Visit (INDEPENDENT_AMBULATORY_CARE_PROVIDER_SITE_OTHER): Admitting: Internal Medicine

## 2024-10-15 VITALS — BP 128/68 | HR 82 | Ht 69.0 in | Wt 193.0 lb

## 2024-10-15 DIAGNOSIS — E118 Type 2 diabetes mellitus with unspecified complications: Secondary | ICD-10-CM | POA: Diagnosis not present

## 2024-10-15 DIAGNOSIS — M539 Dorsopathy, unspecified: Secondary | ICD-10-CM

## 2024-10-15 DIAGNOSIS — M109 Gout, unspecified: Secondary | ICD-10-CM

## 2024-10-15 DIAGNOSIS — I10 Essential (primary) hypertension: Secondary | ICD-10-CM

## 2024-10-15 DIAGNOSIS — Z794 Long term (current) use of insulin: Secondary | ICD-10-CM

## 2024-10-15 DIAGNOSIS — E1142 Type 2 diabetes mellitus with diabetic polyneuropathy: Secondary | ICD-10-CM

## 2024-10-15 DIAGNOSIS — I25119 Atherosclerotic heart disease of native coronary artery with unspecified angina pectoris: Secondary | ICD-10-CM

## 2024-10-15 LAB — POCT GLYCOSYLATED HEMOGLOBIN (HGB A1C): Hemoglobin A1C: 6.5 % — AB (ref 4.0–5.6)

## 2024-10-15 MED ORDER — ALLOPURINOL 300 MG PO TABS: 300.0000 mg | ORAL_TABLET | Freq: Every day | ORAL | 1 refills | Status: DC

## 2024-10-15 MED ORDER — CLOPIDOGREL BISULFATE 75 MG PO TABS: ORAL_TABLET | ORAL | 1 refills | Status: DC

## 2024-10-15 MED ORDER — REPATHA SURECLICK 140 MG/ML ~~LOC~~ SOAJ: 140.0000 mg | SUBCUTANEOUS | 3 refills | Status: DC

## 2024-10-15 MED ORDER — LINAGLIPTIN 5 MG PO TABS: 5.0000 mg | ORAL_TABLET | Freq: Every day | ORAL | 1 refills | Status: DC

## 2024-10-15 MED ORDER — IBUPROFEN 800 MG PO TABS: 800.0000 mg | ORAL_TABLET | Freq: Every day | ORAL | 0 refills | Status: DC | PRN

## 2024-10-15 MED ORDER — PREGABALIN 50 MG PO CAPS: 50.0000 mg | ORAL_CAPSULE | Freq: Two times a day (BID) | ORAL | 3 refills | Status: DC

## 2024-10-15 NOTE — Assessment & Plan Note (Signed)
 Currently medications are MTF, Jardiance , Tradjenta  and insulin .  No hypoglycemic episodes noted. Home blood sugars in the 110 range. Last visit medical regimen changes were none. Lab Results  Component Value Date   HGBA1C 6.5 (A) 10/15/2024  Will continue same medications.

## 2024-10-15 NOTE — Assessment & Plan Note (Signed)
 Well controlled blood pressure today. Current regimen is losartan , Coreg  and spironolactone. No medication side effects noted.

## 2024-10-15 NOTE — Progress Notes (Signed)
 Date:  10/15/2024   Name:  Gary Gutierrez   DOB:  06/28/1951   MRN:  [id number]   Chief Complaint: Hypertension and Diabetes  Diabetes He presents for his follow-up diabetic visit. He has type 2 diabetes mellitus. His disease course has been stable. Pertinent negatives for hypoglycemia include no headaches or tremors. Pertinent negatives for diabetes include no chest pain, no fatigue, no polydipsia and no polyuria.  Hypertension This is a chronic problem. The problem is controlled. Pertinent negatives include no chest pain, headaches, palpitations or shortness of breath. Past treatments include beta blockers, angiotensin blockers, diuretics and direct vasodilators. The current treatment provides significant improvement.    Review of Systems  Constitutional:  Negative for appetite change, fatigue and unexpected weight change.  Eyes:  Negative for visual disturbance.  Respiratory:  Negative for cough, shortness of breath and wheezing.   Cardiovascular:  Negative for chest pain, palpitations and leg swelling.  Gastrointestinal:  Negative for abdominal pain and blood in stool.  Endocrine: Negative for polydipsia and polyuria.  Genitourinary:  Negative for dysuria and hematuria.  Skin:  Negative for color change and rash.  Neurological:  Negative for tremors, numbness and headaches.  Psychiatric/Behavioral:  Negative for dysphoric mood.      Lab Results  Component Value Date   NA 136 06/15/2024   K 4.9 06/15/2024   CO2 23 06/15/2024   GLUCOSE 221 (H) 06/15/2024   BUN 54 (H) 06/15/2024   CREATININE 1.81 (H) 06/15/2024   CALCIUM  9.7 06/15/2024   EGFR 39 (L) 06/15/2024   GFRNONAA 47 (L) 02/14/2021   Lab Results  Component Value Date   CHOL 149 06/15/2024   HDL 30 (L) 06/15/2024   LDLCALC 59 06/15/2024   TRIG 391 (H) 06/15/2024   CHOLHDL 5.0 06/15/2024   Lab Results  Component Value Date   TSH 4.810 (H) 02/12/2024   Lab Results  Component Value Date   HGBA1C 6.5 (A)  10/15/2024   Lab Results  Component Value Date   WBC 11.5 (H) 02/12/2024   HGB 14.2 02/12/2024   HCT 43.4 02/12/2024   MCV 96 02/12/2024   PLT 203 02/12/2024   Lab Results  Component Value Date   ALT 20 02/12/2024   AST 16 02/12/2024   ALKPHOS 165 (H) 02/12/2024   BILITOT 0.8 02/12/2024   No results found for: MARIEN BOLLS, VD25OH   Patient Active Problem List   Diagnosis Date Noted   Background diabetic retinopathy associated with type 2 diabetes mellitus (HCC) 05/07/2022   CKD stage 3 secondary to diabetes (HCC) 07/06/2020   Thoracic aortic atherosclerosis 07/05/2020   Type II diabetes mellitus with complication (HCC) 10/27/2018   CHF (congestive heart failure) (HCC) 10/29/2017   Polyneuropathy due to type 2 diabetes mellitus (HCC) 07/01/2017   PAD (peripheral artery disease) 06/30/2017   Tobacco use disorder, mild, in sustained remission, abuse 04/10/2017   Hx of colonic polyps 04/10/2016   Hearing loss of both ears 01/03/2016   Chronic GERD 06/09/2015   Hyperlipidemia associated with type 2 diabetes mellitus (HCC) 06/09/2015   Essential hypertension 06/09/2015   Gouty arthritis of both feet 06/09/2015   Multilevel degenerative disc disease 06/09/2015   Coronary artery disease involving native coronary artery of native heart with angina pectoris 05/09/2014    Allergies  Allergen Reactions   Entresto  [Sacubitril -Valsartan ] Other (See Comments)    Low blood pressure   Penicillins Other (See Comments)    Other reaction(s): Unknown, pt does not know  reactions    Past Surgical History:  Procedure Laterality Date   APPENDECTOMY     colonscopy  2010   benign polyps   CORONARY ARTERY BYPASS GRAFT  1995   CORONARY STENT INTERVENTION N/A 02/03/2017   Procedure: Coronary Stent Intervention;  Surgeon: Cara JONETTA Lovelace, MD;  Location: [facility] INVASIVE CV LAB;  Service: Cardiovascular;  Laterality: N/A;   ESOPHAGOGASTRODUODENOSCOPY  2010   normal   LEFT HEART  CATH AND CORONARY ANGIOGRAPHY N/A 02/03/2017   Procedure: Left Heart Cath and Coronary Angiography;  Surgeon: Cara JONETTA Lovelace, MD;  Location: [facility] INVASIVE CV LAB;  Service: Cardiovascular;  Laterality: N/A;   PERCUTANEOUS CORONARY STENT INTERVENTION (PCI-S)  2010, 2015   4 stents total    Social History   Tobacco Use   Smoking status: Former    Current packs/day: 0.00    Average packs/day: 1.5 packs/day for 29.0 years (43.5 ttl pk-yrs)    Types: Cigarettes    Start date: 12/30/1965    Quit date: 12/30/1994    Years since quitting: 29.8   Smokeless tobacco: Never   Tobacco comments:    smoking cessation materials not required  Vaping Use   Vaping status: Never Used  Substance Use Topics   Alcohol use: No    Alcohol/week: 0.0 standard drinks of alcohol   Drug use: No     Medication list has been reviewed and updated.  Current Meds  Medication Sig   atorvastatin  (LIPITOR ) 80 MG tablet Take 1 tablet (80 mg total) by mouth daily.   budesonide -formoterol  (SYMBICORT ) 160-4.5 MCG/ACT inhaler Inhale 1 puff into the lungs 2 (two) times daily. (Patient taking differently: Inhale 1 puff into the lungs as needed. prn)   carvedilol  (COREG ) 6.25 MG tablet Take 6.25 mg by mouth 2 (two) times daily with a meal.   Cholecalciferol (VITAMIN D3 PO) Take by mouth.   empagliflozin  (JARDIANCE ) 25 MG TABS tablet Take 1 tablet (25 mg total) by mouth daily.   glipiZIDE  (GLUCOTROL  XL) 10 MG 24 hr tablet Take 1 tablet (10 mg total) by mouth daily with breakfast.   glucose blood (ACCU-CHEK AVIVA PLUS) test strip TEST BLOOD SUGAR DAILY AS DIRECTED UP TO FOUR TIMES DAILY   insulin  degludec (TRESIBA  FLEXTOUCH) 100 UNIT/ML FlexTouch Pen INJECT 50 UNITS INTO THE SKIN EVERY MORNING AND 10 UNITS EVERY EVENING   Insulin  Pen Needle (PEN NEEDLES 31GX5/16) 31G X 8 MM MISC 1 each by Does not apply route daily.   isosorbide  mononitrate (IMDUR ) 30 MG 24 hr tablet Take 1 tablet (30 mg total) by mouth daily.   losartan   (COZAAR ) 25 MG tablet Take 1 tablet (25 mg total) by mouth in the morning and at bedtime.   nitroGLYCERIN  (NITROSTAT ) 0.4 MG SL tablet Place 1 tablet (0.4 mg total) under the tongue every 5 (five) minutes as needed for chest pain.   pantoprazole  (PROTONIX ) 40 MG tablet TAKE 1 TABLET(40 MG) BY MOUTH TWICE DAILY   potassium chloride  SA (K-DUR,KLOR-CON ) 20 MEQ tablet TAKE 2 TABLETS(40 MEQ) BY MOUTH DAILY   spironolactone (ALDACTONE) 25 MG tablet Take 25 mg by mouth daily.   torsemide  (DEMADEX ) 100 MG tablet Take by mouth 2 (two) times daily.    [DISCONTINUED] allopurinol  (ZYLOPRIM ) 300 MG tablet Take 1 tablet (300 mg total) by mouth daily.   [DISCONTINUED] clopidogrel  (PLAVIX ) 75 MG tablet TAKE 1 TABLET(75 MG) BY MOUTH DAILY   [DISCONTINUED] Evolocumab  (REPATHA  SURECLICK) 140 MG/ML SOAJ Inject 140 mg into the skin every 14 (fourteen) days.   [  DISCONTINUED] linagliptin  (TRADJENTA ) 5 MG TABS tablet Take 1 tablet (5 mg total) by mouth daily.   [DISCONTINUED] pregabalin  (LYRICA ) 50 MG capsule Take 1 capsule (50 mg total) by mouth 2 (two) times daily.       10/15/2024   10:03 AM 08/20/2024    8:49 AM 06/15/2024    9:57 AM 02/12/2024   10:56 AM  GAD 7 : Generalized Anxiety Score  Nervous, Anxious, on Edge 0 0 0 0  Control/stop worrying 0 0 0 0  Worry too much - different things 0 0 0 0  Trouble relaxing 0 0 0 0  Restless 0 0 0 0  Easily annoyed or irritable 0 0 0 0  Afraid - awful might happen 0 0 0 0  Total GAD 7 Score 0 0 0 0  Anxiety Difficulty Not difficult at all Not difficult at all Not difficult at all Not difficult at all       10/15/2024   10:03 AM 08/20/2024    8:49 AM 07/08/2024    8:17 AM  Depression screen PHQ 2/9  Decreased Interest 0 0 0  Down, Depressed, Hopeless 0 0 0  PHQ - 2 Score 0 0 0  Altered sleeping 0 0 0  Tired, decreased energy 0 0 1  Change in appetite 0 0 0  Feeling bad or failure about yourself  0 0 0  Trouble concentrating 0 0 0  Moving slowly or  fidgety/restless 0 0 0  Suicidal thoughts 0 0 0  PHQ-9 Score 0 0 1  Difficult doing work/chores Not difficult at all Not difficult at all Not difficult at all    BP Readings from Last 3 Encounters:  10/15/24 128/68  08/20/24 122/74  06/15/24 98/62    Physical Exam Vitals and nursing note reviewed.  Constitutional:      General: He is not in acute distress.    Appearance: Normal appearance. He is well-developed.  HENT:     Head: Normocephalic and atraumatic.  Cardiovascular:     Rate and Rhythm: Normal rate and regular rhythm.     Heart sounds: No murmur heard. Pulmonary:     Effort: Pulmonary effort is normal. No respiratory distress.     Breath sounds: No wheezing or rhonchi.  Musculoskeletal:     Cervical back: Normal range of motion.     Right lower leg: No edema.     Left lower leg: No edema.  Skin:    General: Skin is warm and dry.     Findings: No rash.  Neurological:     Mental Status: He is alert and oriented to person, place, and time.  Psychiatric:        Mood and Affect: Mood normal.        Behavior: Behavior normal.     Wt Readings from Last 3 Encounters:  10/15/24 193 lb (87.5 kg)  08/20/24 193 lb (87.5 kg)  07/08/24 185 lb (83.9 kg)    BP 128/68   Pulse 82   Ht 5' 9 (1.753 m)   Wt 193 lb (87.5 kg)   SpO2 98%   BMI 28.50 kg/m   Assessment and Plan:  Problem List Items Addressed This Visit       Unprioritized   Coronary artery disease involving native coronary artery of native heart with angina pectoris (Chronic)   Relevant Medications   clopidogrel  (PLAVIX ) 75 MG tablet   Evolocumab  (REPATHA  SURECLICK) 140 MG/ML SOAJ   Essential hypertension (Chronic)   Well  controlled blood pressure today. Current regimen is losartan , Coreg  and spironolactone. No medication side effects noted.        Relevant Medications   Evolocumab  (REPATHA  SURECLICK) 140 MG/ML SOAJ   Gouty arthritis of both feet   Relevant Medications   ibuprofen  (ADVIL )  800 MG tablet   allopurinol  (ZYLOPRIM ) 300 MG tablet   Multilevel degenerative disc disease   Relevant Medications   ibuprofen  (ADVIL ) 800 MG tablet   Polyneuropathy due to type 2 diabetes mellitus (HCC) (Chronic)   Relevant Medications   pregabalin  (LYRICA ) 50 MG capsule   linagliptin  (TRADJENTA ) 5 MG TABS tablet   Type II diabetes mellitus with complication (HCC) - Primary (Chronic)   Currently medications are MTF, Jardiance , Tradjenta  and insulin .  No hypoglycemic episodes noted. Home blood sugars in the 110 range. Last visit medical regimen changes were none. Lab Results  Component Value Date   HGBA1C 6.5 (A) 10/15/2024  Will continue same medications.       Relevant Medications   linagliptin  (TRADJENTA ) 5 MG TABS tablet   Other Relevant Orders   POCT glycosylated hemoglobin (Hb A1C) (Completed)   Other Visit Diagnoses       Long-term insulin  use (HCC)           Return in about 4 months (around 02/15/2025) for TOC HTN, DM, CAD - Dr. LOIS    Leita HILARIO Adie, MD [facility] Primary Care and Sports Medicine [place]

## 2024-11-22 ENCOUNTER — Telehealth: Payer: Self-pay | Admitting: Internal Medicine

## 2024-11-22 NOTE — Telephone Encounter (Signed)
 Copied from CRM [id number]. Topic: General - Deceased Patient >> 2024-12-01 12:40 PM Shanda MATSU wrote: Name of caller: Santhiago Collingsworth  Date of death: 11-29-24  Name of funeral home: [facility]   Phone number of funeral home: [phone]  Provider that needs to sign form: Dr. Leita Adie  Timeline for signing: ASAP

## 2024-11-23 ENCOUNTER — Other Ambulatory Visit: Payer: Self-pay | Admitting: Internal Medicine

## 2024-11-23 DIAGNOSIS — M539 Dorsopathy, unspecified: Secondary | ICD-10-CM

## 2024-11-23 DIAGNOSIS — E118 Type 2 diabetes mellitus with unspecified complications: Secondary | ICD-10-CM

## 2024-11-23 DIAGNOSIS — E1142 Type 2 diabetes mellitus with diabetic polyneuropathy: Secondary | ICD-10-CM

## 2024-11-23 NOTE — Telephone Encounter (Unsigned)
 Copied from CRM #[id number]. Topic: Clinical - Medication Refill >> Nov 23, 2024  1:11 PM Shardie S wrote: Medication: ibuprofen  (ADVIL ) 800 MG tablet glipiZIDE  (GLUCOTROL  XL) 10 MG 24 hr tablet empagliflozin  (JARDIANCE ) 25 MG TABS tablet pregabalin  (LYRICA ) 50 MG capsule  Has the patient contacted their pharmacy? Yes (Agent: If no, request that the patient contact the pharmacy for the refill. If patient does not wish to contact the pharmacy document the reason why and proceed with request.) (Agent: If yes, when and what did the pharmacy advise?)  This is the patient's preferred pharmacy:  [facility] [address] [place], [place] [address] Phone: [phone] Fax: [phone]  Is this the correct pharmacy for this prescription? Yes If no, delete pharmacy and type the correct one.   Has the prescription been filled recently? No  Is the patient out of the medication? Yes  Has the patient been seen for an appointment in the last year OR does the patient have an upcoming appointment? Yes  Can we respond through MyChart? No  Agent: Please be advised that Rx refills may take up to 3 business days. We ask that you follow-up with your pharmacy.

## 2024-11-26 MED ORDER — EMPAGLIFLOZIN 25 MG PO TABS: 25.0000 mg | ORAL_TABLET | Freq: Every day | ORAL | 0 refills | Status: DC

## 2024-11-26 MED ORDER — GLIPIZIDE ER 10 MG PO TB24: 10.0000 mg | ORAL_TABLET | Freq: Every day | ORAL | 0 refills | Status: DC

## 2024-11-26 MED ORDER — IBUPROFEN 800 MG PO TABS: 800.0000 mg | ORAL_TABLET | Freq: Every day | ORAL | 0 refills | Status: DC | PRN

## 2024-11-26 NOTE — Telephone Encounter (Signed)
 Requested medication (s) are due for refill today: yes  Requested medication (s) are on the active medication list: yes  Last refill:  10/15/24  Future visit scheduled: yes  Notes to clinic:  Unable to refill per protocol, cannot delegate.       Requested Prescriptions  Pending Prescriptions Disp Refills   pregabalin  (LYRICA ) 50 MG capsule 60 capsule 3    Sig: Take 1 capsule (50 mg total) by mouth 2 (two) times daily.     Not Delegated - Neurology:  Anticonvulsants - Controlled - pregabalin  Failed - 11/26/2024 10:04 AM      Failed - This refill cannot be delegated      Failed - Cr in normal range and within 360 days    Creatinine  Date Value Ref Range Status  10/11/2014 1.19 0.60 - 1.30 mg/dL Final   Creatinine, Ser  Date Value Ref Range Status  06/15/2024 1.81 (H) 0.76 - 1.27 mg/dL Final         Passed - Completed PHQ-2 or PHQ-9 in the last 360 days      Passed - Valid encounter within last 12 months    Recent Outpatient Visits           1 month ago Type II diabetes mellitus with complication [facility])   [place] [facility] at [place] [facility], Leita DEL, MD   3 months ago Olecranon bursitis of right elbow   [place] [facility] at [facility], Leita DEL, MD   5 months ago Type II diabetes mellitus with complication [facility])   [place] [facility] Medicine at [facility], Leita DEL, MD   9 months ago Annual physical exam   [facility] at [facility], Leita DEL, MD              Signed Prescriptions Disp Refills   ibuprofen  (ADVIL ) 800 MG tablet 90 tablet 0    Sig: Take 1 tablet (800 mg total) by mouth daily as needed.     Analgesics:  NSAIDS Failed - 11/26/2024 10:04 AM      Failed - Manual Review: Labs are only required if the patient has taken medication for more than 8 weeks.      Failed - Cr in normal range and within 360 days     Creatinine  Date Value Ref Range Status  10/11/2014 1.19 0.60 - 1.30 mg/dL Final   Creatinine, Ser  Date Value Ref Range Status  06/15/2024 1.81 (H) 0.76 - 1.27 mg/dL Final         Passed - HGB in normal range and within 360 days    Hemoglobin  Date Value Ref Range Status  02/12/2024 14.2 13.0 - 17.7 g/dL Final         Passed - PLT in normal range and within 360 days    Platelets  Date Value Ref Range Status  02/12/2024 203 150 - 450 x10E3/uL Final         Passed - HCT in normal range and within 360 days    Hematocrit  Date Value Ref Range Status  02/12/2024 43.4 37.5 - 51.0 % Final         Passed - eGFR is 30 or above and within 360 days    EGFR (African American)  Date Value Ref Range Status  10/11/2014 >60 >66mL/min Final   GFR calc Af [place]  Date Value  Ref Range Status  02/14/2021 54 (L) >59 mL/min/1.73 Final    Comment:    **In accordance with recommendations from the NKF-ASN Task force,**   [facility] is in the process of updating its eGFR calculation to the   2021 CKD-EPI creatinine equation that estimates kidney function   without a race variable.    EGFR (Non-African Amer.)  Date Value Ref Range Status  10/11/2014 >60 >36mL/min Final    Comment:    eGFR values <1mL/min/1.73 m2 may be an indication of chronic kidney disease (CKD). Calculated eGFR, using the MRDR Study equation, is useful in  patients with stable renal function. The eGFR calculation will not be reliable in acutely ill patients when serum creatinine is changing rapidly. It is not useful in patients on dialysis. The eGFR calculation may not be applicable to patients at the low and high extremes of body sizes, pregnant women, and vegetarians.    GFR calc non Af Amer  Date Value Ref Range Status  02/14/2021 47 (L) >59 mL/min/1.73 Final   eGFR  Date Value Ref Range Status  06/15/2024 39 (L) >59 mL/min/1.73 Final         Passed - Patient is not pregnant      Passed - Valid  encounter within last 12 months    Recent Outpatient Visits           1 month ago Type II diabetes mellitus with complication (HCC)   [place] [facility] at [facility], Leita DEL, MD   3 months ago Olecranon bursitis of right elbow   [place] [facility] at [facility], Leita DEL, MD   5 months ago Type II diabetes mellitus with complication [facility])   [place] [facility] Medicine at [facility], Leita DEL, MD   9 months ago Annual physical exam   [facility] at [facility], Leita DEL, MD               glipiZIDE  (GLUCOTROL  XL) 10 MG 24 hr tablet 90 tablet 0    Sig: Take 1 tablet (10 mg total) by mouth daily with breakfast.     Endocrinology:  Diabetes - Sulfonylureas Failed - 11/26/2024 10:04 AM      Failed - Cr in normal range and within 360 days    Creatinine  Date Value Ref Range Status  10/11/2014 1.19 0.60 - 1.30 mg/dL Final   Creatinine, Ser  Date Value Ref Range Status  06/15/2024 1.81 (H) 0.76 - 1.27 mg/dL Final         Passed - HBA1C is between 0 and 7.9 and within 180 days    Hemoglobin A1C  Date Value Ref Range Status  10/15/2024 6.5 (A) 4.0 - 5.6 % Final  10/11/2014 6.9 (H) 4.2 - 6.3 % Final    Comment:    The American Diabetes Association recommends that a primary goal of therapy should be <7% and that physicians should reevaluate the treatment regimen in patients with HbA1c values consistently >8%.    Hgb A1c MFr Bld  Date Value Ref Range Status  06/15/2024 7.6 (H) 4.8 - 5.6 % Final    Comment:             Prediabetes: 5.7 - 6.4          Diabetes: >6.4          Glycemic control for adults with diabetes: <  7.0          Passed - Valid encounter within last 6 months    Recent Outpatient Visits           1 month ago Type II diabetes mellitus with complication [facility])   [place] [facility] Medicine at  [facility], Leita DEL, MD   3 months ago Olecranon bursitis of right elbow   [facility] at [facility], Leita DEL, MD   5 months ago Type II diabetes mellitus with complication [facility])    Primary Care & Sports Medicine at [facility], Leita DEL, MD   9 months ago Annual physical exam   [facility] at [facility], Leita DEL, MD               empagliflozin  (JARDIANCE ) 25 MG TABS tablet 90 tablet 0    Sig: Take 1 tablet (25 mg total) by mouth daily.     Endocrinology:  Diabetes - SGLT2 Inhibitors Failed - 11/26/2024 10:04 AM      Failed - Cr in normal range and within 360 days    Creatinine  Date Value Ref Range Status  10/11/2014 1.19 0.60 - 1.30 mg/dL Final   Creatinine, Ser  Date Value Ref Range Status  06/15/2024 1.81 (H) 0.76 - 1.27 mg/dL Final         Failed - eGFR in normal range and within 360 days    EGFR (African American)  Date Value Ref Range Status  10/11/2014 >60 >81mL/min Final   GFR calc Af Amer  Date Value Ref Range Status  02/14/2021 54 (L) >59 mL/min/1.73 Final    Comment:    **In accordance with recommendations from the NKF-ASN Task force,**   [facility] is in the process of updating its eGFR calculation to the   2021 CKD-EPI creatinine equation that estimates kidney function   without a race variable.    EGFR (Non-African Amer.)  Date Value Ref Range Status  10/11/2014 >60 >76mL/min Final    Comment:    eGFR values <77mL/min/1.73 m2 may be an indication of chronic kidney disease (CKD). Calculated eGFR, using the MRDR Study equation, is useful in  patients with stable renal function. The eGFR calculation will not be reliable in acutely ill patients when serum creatinine is changing rapidly. It is not useful in patients on dialysis. The eGFR calculation may not be applicable to patients at the low and high extremes of body sizes,  pregnant women, and vegetarians.    GFR calc non Af Amer  Date Value Ref Range Status  02/14/2021 47 (L) >59 mL/min/1.73 Final   eGFR  Date Value Ref Range Status  06/15/2024 39 (L) >59 mL/min/1.73 Final         Passed - HBA1C is between 0 and 7.9 and within 180 days    Hemoglobin A1C  Date Value Ref Range Status  10/15/2024 6.5 (A) 4.0 - 5.6 % Final  10/11/2014 6.9 (H) 4.2 - 6.3 % Final    Comment:    The American Diabetes Association recommends that a primary goal of therapy should be <7% and that physicians should reevaluate the treatment regimen in patients with HbA1c values consistently >8%.    Hgb A1c MFr Bld  Date Value Ref Range Status  06/15/2024 7.6 (H) 4.8 - 5.6 % Final    Comment:  Prediabetes: 5.7 - 6.4          Diabetes: >6.4          Glycemic control for adults with diabetes: <7.0          Passed - Valid encounter within last 6 months    Recent Outpatient Visits           1 month ago Type II diabetes mellitus with complication [facility])   [place] [facility] at [facility], Leita DEL, MD   3 months ago Olecranon bursitis of right elbow   [facility] at [facility], Leita DEL, MD   5 months ago Type II diabetes mellitus with complication [facility])   [place] [facility] Medicine at [facility], Leita DEL, MD   9 months ago Annual physical exam   [facility] at [facility], Leita DEL, MD

## 2024-11-26 NOTE — Telephone Encounter (Signed)
 Requested Prescriptions  Pending Prescriptions Disp Refills   ibuprofen  (ADVIL ) 800 MG tablet 90 tablet 0    Sig: Take 1 tablet (800 mg total) by mouth daily as needed.     Analgesics:  NSAIDS Failed - 11/26/2024 10:01 AM      Failed - Manual Review: Labs are only required if the patient has taken medication for more than 8 weeks.      Failed - Cr in normal range and within 360 days    Creatinine  Date Value Ref Range Status  10/11/2014 1.19 0.60 - 1.30 mg/dL Final   Creatinine, Ser  Date Value Ref Range Status  06/15/2024 1.81 (H) 0.76 - 1.27 mg/dL Final         Passed - HGB in normal range and within 360 days    Hemoglobin  Date Value Ref Range Status  02/12/2024 14.2 13.0 - 17.7 g/dL Final         Passed - PLT in normal range and within 360 days    Platelets  Date Value Ref Range Status  02/12/2024 203 150 - 450 x10E3/uL Final         Passed - HCT in normal range and within 360 days    Hematocrit  Date Value Ref Range Status  02/12/2024 43.4 37.5 - 51.0 % Final         Passed - eGFR is 30 or above and within 360 days    EGFR (African American)  Date Value Ref Range Status  10/11/2014 >60 >90mL/min Final   GFR calc Af Amer  Date Value Ref Range Status  02/14/2021 54 (L) >59 mL/min/1.73 Final    Comment:    **In accordance with recommendations from the NKF-ASN Task force,**   [facility] is in the process of updating its eGFR calculation to the   2021 CKD-EPI creatinine equation that estimates kidney function   without a race variable.    EGFR (Non-African Amer.)  Date Value Ref Range Status  10/11/2014 >60 >51mL/min Final    Comment:    eGFR values <73mL/min/1.73 m2 may be an indication of chronic kidney disease (CKD). Calculated eGFR, using the MRDR Study equation, is useful in  patients with stable renal function. The eGFR calculation will not be reliable in acutely ill patients when serum creatinine is changing rapidly. It is not useful in patients on  dialysis. The eGFR calculation may not be applicable to patients at the low and high extremes of body sizes, pregnant women, and vegetarians.    GFR calc non Af Amer  Date Value Ref Range Status  02/14/2021 47 (L) >59 mL/min/1.73 Final   eGFR  Date Value Ref Range Status  06/15/2024 39 (L) >59 mL/min/1.73 Final         Passed - Patient is not pregnant      Passed - Valid encounter within last 12 months    Recent Outpatient Visits           1 month ago Type II diabetes mellitus with complication [facility])   [place] [facility] at [facility], Leita DEL, MD   3 months ago Olecranon bursitis of right elbow   [place] [facility] at [facility], Leita DEL, MD   5 months ago Type II diabetes mellitus with complication [facility])   [place] [facility] at [facility], Leita DEL, MD   9 months ago Annual physical exam  [facility] at [facility], Leita DEL, MD               glipiZIDE  (GLUCOTROL  XL) 10 MG 24 hr tablet 90 tablet 0    Sig: Take 1 tablet (10 mg total) by mouth daily with breakfast.     Endocrinology:  Diabetes - Sulfonylureas Failed - 11/26/2024 10:01 AM      Failed - Cr in normal range and within 360 days    Creatinine  Date Value Ref Range Status  10/11/2014 1.19 0.60 - 1.30 mg/dL Final   Creatinine, Ser  Date Value Ref Range Status  06/15/2024 1.81 (H) 0.76 - 1.27 mg/dL Final         Passed - HBA1C is between 0 and 7.9 and within 180 days    Hemoglobin A1C  Date Value Ref Range Status  10/15/2024 6.5 (A) 4.0 - 5.6 % Final  10/11/2014 6.9 (H) 4.2 - 6.3 % Final    Comment:    The American Diabetes Association recommends that a primary goal of therapy should be <7% and that physicians should reevaluate the treatment regimen in patients with HbA1c values consistently >8%.    Hgb A1c MFr Bld  Date Value Ref Range  Status  06/15/2024 7.6 (H) 4.8 - 5.6 % Final    Comment:             Prediabetes: 5.7 - 6.4          Diabetes: >6.4          Glycemic control for adults with diabetes: <7.0          Passed - Valid encounter within last 6 months    Recent Outpatient Visits           1 month ago Type II diabetes mellitus with complication [facility])   [place] [facility] at [facility], Leita DEL, MD   3 months ago Olecranon bursitis of right elbow   [place] [facility] at [facility], Leita DEL, MD   5 months ago Type II diabetes mellitus with complication [facility])   [place] [facility] at [facility], Leita DEL, MD   9 months ago Annual physical exam   [facility] at [facility] Leita DEL, MD               empagliflozin  (JARDIANCE ) 25 MG TABS tablet 90 tablet 0    Sig: Take 1 tablet (25 mg total) by mouth daily.     Endocrinology:  Diabetes - SGLT2 Inhibitors Failed - 11/26/2024 10:01 AM      Failed - Cr in normal range and within 360 days    Creatinine  Date Value Ref Range Status  10/11/2014 1.19 0.60 - 1.30 mg/dL Final   Creatinine, Ser  Date Value Ref Range Status  06/15/2024 1.81 (H) 0.76 - 1.27 mg/dL Final         Failed - eGFR in normal range and within 360 days    EGFR (African American)  Date Value Ref Range Status  10/11/2014 >60 >58mL/min Final   GFR calc Af Amer  Date Value Ref Range Status  02/14/2021 54 (L) >59 mL/min/1.73 Final    Comment:    **In accordance with recommendations from the NKF-ASN Task force,**   [facility] is in the process of updating its eGFR calculation to the  2021 CKD-EPI creatinine equation that estimates kidney function   without a race variable.    EGFR (Non-African Amer.)  Date Value Ref Range Status  10/11/2014 >60 >49mL/min Final    Comment:    eGFR values <56mL/min/1.73 m2 may be an indication  of chronic kidney disease (CKD). Calculated eGFR, using the MRDR Study equation, is useful in  patients with stable renal function. The eGFR calculation will not be reliable in acutely ill patients when serum creatinine is changing rapidly. It is not useful in patients on dialysis. The eGFR calculation may not be applicable to patients at the low and high extremes of body sizes, pregnant women, and vegetarians.    GFR calc non Af Amer  Date Value Ref Range Status  02/14/2021 47 (L) >59 mL/min/1.73 Final   eGFR  Date Value Ref Range Status  06/15/2024 39 (L) >59 mL/min/1.73 Final         Passed - HBA1C is between 0 and 7.9 and within 180 days    Hemoglobin A1C  Date Value Ref Range Status  10/15/2024 6.5 (A) 4.0 - 5.6 % Final  10/11/2014 6.9 (H) 4.2 - 6.3 % Final    Comment:    The American Diabetes Association recommends that a primary goal of therapy should be <7% and that physicians should reevaluate the treatment regimen in patients with HbA1c values consistently >8%.    Hgb A1c MFr Bld  Date Value Ref Range Status  06/15/2024 7.6 (H) 4.8 - 5.6 % Final    Comment:             Prediabetes: 5.7 - 6.4          Diabetes: >6.4          Glycemic control for adults with diabetes: <7.0          Passed - Valid encounter within last 6 months    Recent Outpatient Visits           1 month ago Type II diabetes mellitus with complication [facility])   [place] [facility] at [facility], Leita DEL, MD   3 months ago Olecranon bursitis of right elbow   [place] [facility] at [facility], Leita DEL, MD   5 months ago Type II diabetes mellitus with complication [facility])   [place] [facility] at [facility], Leita DEL, MD   9 months ago Annual physical exam   [place] [facility] at [facility], Leita DEL, MD               pregabalin  (LYRICA ) 50  MG capsule 60 capsule 3    Sig: Take 1 capsule (50 mg total) by mouth 2 (two) times daily.     Not Delegated - Neurology:  Anticonvulsants - Controlled - pregabalin  Failed - 11/26/2024 10:01 AM      Failed - This refill cannot be delegated      Failed - Cr in normal range and within 360 days    Creatinine  Date Value Ref Range Status  10/11/2014 1.19 0.60 - 1.30 mg/dL Final   Creatinine, Ser  Date Value Ref Range Status  06/15/2024 1.81 (H) 0.76 - 1.27 mg/dL Final         Passed - Completed PHQ-2 or PHQ-9 in the last 360 days      Passed - Valid encounter within last 12 months  Recent Outpatient Visits           1 month ago Type II diabetes mellitus with complication [facility])   [place] [facility] at [facility], Leita DEL, MD   3 months ago Olecranon bursitis of right elbow   [facility] at [facility], Leita DEL, MD   5 months ago Type II diabetes mellitus with complication [facility])    [facility] at [facility], Leita DEL, MD   9 months ago Annual physical exam   [facility] at [facility], Leita DEL, MD

## 2024-11-29 NOTE — Telephone Encounter (Signed)
 Please review.  KP

## 2024-11-29 DEATH — deceased

## 2025-02-18 ENCOUNTER — Encounter: Admitting: Family Medicine

## 2025-07-21 ENCOUNTER — Ambulatory Visit
